# Patient Record
Sex: Female | Born: 1935 | Race: White | Hispanic: No | Marital: Married | Smoking: Never smoker
Health system: Southern US, Community
[De-identification: ages and names within clinical notes are randomized; demographics above are authoritative.]

## PROBLEM LIST (undated history)

## (undated) DIAGNOSIS — E113299 Type 2 diabetes mellitus with mild nonproliferative diabetic retinopathy without macular edema, unspecified eye: Secondary | ICD-10-CM

## (undated) DIAGNOSIS — E039 Hypothyroidism, unspecified: Secondary | ICD-10-CM

## (undated) DIAGNOSIS — N183 Chronic kidney disease, stage 3 unspecified: Secondary | ICD-10-CM

## (undated) DIAGNOSIS — G629 Polyneuropathy, unspecified: Secondary | ICD-10-CM

## (undated) DIAGNOSIS — M519 Unspecified thoracic, thoracolumbar and lumbosacral intervertebral disc disorder: Secondary | ICD-10-CM

## (undated) DIAGNOSIS — I4891 Unspecified atrial fibrillation: Secondary | ICD-10-CM

## (undated) DIAGNOSIS — E119 Type 2 diabetes mellitus without complications: Secondary | ICD-10-CM

## (undated) DIAGNOSIS — E785 Hyperlipidemia, unspecified: Secondary | ICD-10-CM

## (undated) DIAGNOSIS — K589 Irritable bowel syndrome without diarrhea: Secondary | ICD-10-CM

## (undated) DIAGNOSIS — M51379 Other intervertebral disc degeneration, lumbosacral region without mention of lumbar back pain or lower extremity pain: Secondary | ICD-10-CM

## (undated) DIAGNOSIS — I10 Essential (primary) hypertension: Secondary | ICD-10-CM

## (undated) DIAGNOSIS — I509 Heart failure, unspecified: Secondary | ICD-10-CM

## (undated) DIAGNOSIS — E669 Obesity, unspecified: Secondary | ICD-10-CM

## (undated) DIAGNOSIS — G894 Chronic pain syndrome: Secondary | ICD-10-CM

## (undated) DIAGNOSIS — K219 Gastro-esophageal reflux disease without esophagitis: Secondary | ICD-10-CM

## (undated) DIAGNOSIS — R001 Bradycardia, unspecified: Secondary | ICD-10-CM

## (undated) DIAGNOSIS — F419 Anxiety disorder, unspecified: Secondary | ICD-10-CM

## (undated) DIAGNOSIS — M5137 Other intervertebral disc degeneration, lumbosacral region: Secondary | ICD-10-CM

## (undated) DIAGNOSIS — M419 Scoliosis, unspecified: Secondary | ICD-10-CM

## (undated) DIAGNOSIS — I5032 Chronic diastolic (congestive) heart failure: Secondary | ICD-10-CM

## (undated) DIAGNOSIS — I2510 Atherosclerotic heart disease of native coronary artery without angina pectoris: Secondary | ICD-10-CM

## (undated) HISTORY — PX: ROTATOR CUFF REPAIR: SHX139

## (undated) HISTORY — PX: BACK SURGERY: SHX140

## (undated) HISTORY — PX: PILONIDAL CYST EXCISION: SHX744

## (undated) HISTORY — DX: Type 2 diabetes mellitus without complications: E11.9

## (undated) HISTORY — PX: INDUCED ABORTION: SHX677

## (undated) HISTORY — PX: CHOLECYSTECTOMY: SHX55

## (undated) HISTORY — DX: Type 2 diabetes mellitus with mild nonproliferative diabetic retinopathy without macular edema, unspecified eye: E11.3299

## (undated) HISTORY — DX: Other intervertebral disc degeneration, lumbosacral region without mention of lumbar back pain or lower extremity pain: M51.379

## (undated) HISTORY — DX: Polyneuropathy, unspecified: G62.9

## (undated) HISTORY — PX: TONSILLECTOMY: SUR1361

## (undated) HISTORY — PX: CARDIAC CATHETERIZATION: SHX172

## (undated) HISTORY — PX: DILATION AND CURETTAGE OF UTERUS: SHX78

## (undated) HISTORY — DX: Bradycardia, unspecified: R00.1

## (undated) HISTORY — DX: Anxiety disorder, unspecified: F41.9

## (undated) HISTORY — PX: LAMINOTOMY: SHX998

## (undated) HISTORY — PX: TOTAL ABDOMINAL HYSTERECTOMY W/ BILATERAL SALPINGOOPHORECTOMY: SHX83

## (undated) HISTORY — DX: Other intervertebral disc degeneration, lumbosacral region: M51.37

---

## 1998-10-23 ENCOUNTER — Other Ambulatory Visit: Admission: RE | Admit: 1998-10-23 | Discharge: 1998-10-23 | Payer: Self-pay | Admitting: Obstetrics and Gynecology

## 1999-06-12 ENCOUNTER — Encounter: Payer: Self-pay | Admitting: Internal Medicine

## 1999-06-12 ENCOUNTER — Observation Stay (HOSPITAL_COMMUNITY): Admission: EM | Admit: 1999-06-12 | Discharge: 1999-06-12 | Payer: Self-pay | Admitting: Emergency Medicine

## 1999-07-04 HISTORY — PX: LUMBAR DISC SURGERY: SHX700

## 1999-07-15 ENCOUNTER — Encounter: Payer: Self-pay | Admitting: Neurosurgery

## 1999-07-15 ENCOUNTER — Inpatient Hospital Stay (HOSPITAL_COMMUNITY): Admission: RE | Admit: 1999-07-15 | Discharge: 1999-07-16 | Payer: Self-pay | Admitting: Neurosurgery

## 1999-09-19 ENCOUNTER — Encounter: Payer: Self-pay | Admitting: Neurosurgery

## 1999-09-19 ENCOUNTER — Ambulatory Visit (HOSPITAL_COMMUNITY): Admission: RE | Admit: 1999-09-19 | Discharge: 1999-09-19 | Payer: Self-pay | Admitting: Neurosurgery

## 1999-10-08 ENCOUNTER — Encounter: Admission: RE | Admit: 1999-10-08 | Discharge: 2000-01-06 | Payer: Self-pay | Admitting: Anesthesiology

## 2000-01-14 ENCOUNTER — Other Ambulatory Visit: Admission: RE | Admit: 2000-01-14 | Discharge: 2000-01-14 | Payer: Self-pay | Admitting: Obstetrics and Gynecology

## 2000-03-31 ENCOUNTER — Encounter: Payer: Self-pay | Admitting: Gastroenterology

## 2000-03-31 ENCOUNTER — Encounter: Admission: RE | Admit: 2000-03-31 | Discharge: 2000-03-31 | Payer: Self-pay | Admitting: Gastroenterology

## 2000-04-29 ENCOUNTER — Encounter: Admission: RE | Admit: 2000-04-29 | Discharge: 2000-04-29 | Payer: Self-pay | Admitting: Gastroenterology

## 2000-04-29 ENCOUNTER — Encounter: Payer: Self-pay | Admitting: Gastroenterology

## 2000-06-11 ENCOUNTER — Encounter: Payer: Self-pay | Admitting: Specialist

## 2000-06-11 ENCOUNTER — Ambulatory Visit (HOSPITAL_COMMUNITY): Admission: RE | Admit: 2000-06-11 | Discharge: 2000-06-11 | Payer: Self-pay | Admitting: Specialist

## 2000-07-22 ENCOUNTER — Encounter: Payer: Self-pay | Admitting: Specialist

## 2000-07-28 ENCOUNTER — Encounter: Payer: Self-pay | Admitting: Specialist

## 2000-07-28 ENCOUNTER — Inpatient Hospital Stay (HOSPITAL_COMMUNITY): Admission: RE | Admit: 2000-07-28 | Discharge: 2000-07-31 | Payer: Self-pay | Admitting: Specialist

## 2000-08-03 HISTORY — PX: HEMILAMINOTOMY LUMBAR SPINE: SUR654

## 2001-04-07 ENCOUNTER — Other Ambulatory Visit: Admission: RE | Admit: 2001-04-07 | Discharge: 2001-04-07 | Payer: Self-pay | Admitting: Obstetrics and Gynecology

## 2001-05-19 ENCOUNTER — Ambulatory Visit: Admission: RE | Admit: 2001-05-19 | Discharge: 2001-05-19 | Payer: Self-pay | Admitting: Specialist

## 2001-08-09 ENCOUNTER — Inpatient Hospital Stay (HOSPITAL_COMMUNITY): Admission: EM | Admit: 2001-08-09 | Discharge: 2001-08-10 | Payer: Self-pay | Admitting: Specialist

## 2002-02-05 ENCOUNTER — Emergency Department (HOSPITAL_COMMUNITY): Admission: EM | Admit: 2002-02-05 | Discharge: 2002-02-05 | Payer: Self-pay | Admitting: Emergency Medicine

## 2002-02-05 ENCOUNTER — Encounter: Payer: Self-pay | Admitting: Emergency Medicine

## 2002-07-26 ENCOUNTER — Other Ambulatory Visit: Admission: RE | Admit: 2002-07-26 | Discharge: 2002-07-26 | Payer: Self-pay | Admitting: Obstetrics and Gynecology

## 2003-08-20 ENCOUNTER — Inpatient Hospital Stay (HOSPITAL_COMMUNITY): Admission: EM | Admit: 2003-08-20 | Discharge: 2003-08-22 | Payer: Self-pay | Admitting: Emergency Medicine

## 2003-09-03 ENCOUNTER — Encounter: Admission: RE | Admit: 2003-09-03 | Discharge: 2003-09-03 | Payer: Self-pay | Admitting: Cardiology

## 2006-04-12 ENCOUNTER — Other Ambulatory Visit: Admission: RE | Admit: 2006-04-12 | Discharge: 2006-04-12 | Payer: Self-pay | Admitting: Obstetrics and Gynecology

## 2008-01-31 ENCOUNTER — Emergency Department (HOSPITAL_COMMUNITY): Admission: EM | Admit: 2008-01-31 | Discharge: 2008-01-31 | Payer: Self-pay | Admitting: Emergency Medicine

## 2008-02-18 ENCOUNTER — Inpatient Hospital Stay (HOSPITAL_COMMUNITY): Admission: EM | Admit: 2008-02-18 | Discharge: 2008-02-21 | Payer: Self-pay | Admitting: Emergency Medicine

## 2008-02-18 ENCOUNTER — Ambulatory Visit: Payer: Self-pay | Admitting: Internal Medicine

## 2008-07-19 ENCOUNTER — Other Ambulatory Visit: Admission: RE | Admit: 2008-07-19 | Discharge: 2008-07-19 | Payer: Self-pay | Admitting: Obstetrics and Gynecology

## 2010-02-12 ENCOUNTER — Other Ambulatory Visit: Admission: RE | Admit: 2010-02-12 | Discharge: 2010-02-12 | Payer: Self-pay | Admitting: Obstetrics and Gynecology

## 2010-10-26 ENCOUNTER — Inpatient Hospital Stay (HOSPITAL_COMMUNITY)
Admission: EM | Admit: 2010-10-26 | Discharge: 2010-10-27 | DRG: 303 | Disposition: A | Discharge status: Home / Self Care | Payer: Medicare Other | Attending: Internal Medicine | Admitting: Internal Medicine

## 2010-10-26 ENCOUNTER — Emergency Department (HOSPITAL_COMMUNITY): Payer: Medicare Other

## 2010-10-26 DIAGNOSIS — E039 Hypothyroidism, unspecified: Secondary | ICD-10-CM | POA: Diagnosis present

## 2010-10-26 DIAGNOSIS — I200 Unstable angina: Secondary | ICD-10-CM | POA: Diagnosis present

## 2010-10-26 DIAGNOSIS — Z9861 Coronary angioplasty status: Secondary | ICD-10-CM

## 2010-10-26 DIAGNOSIS — Z66 Do not resuscitate: Secondary | ICD-10-CM | POA: Diagnosis present

## 2010-10-26 DIAGNOSIS — M4120 Other idiopathic scoliosis, site unspecified: Secondary | ICD-10-CM | POA: Diagnosis present

## 2010-10-26 DIAGNOSIS — I2510 Atherosclerotic heart disease of native coronary artery without angina pectoris: Principal | ICD-10-CM | POA: Diagnosis present

## 2010-10-26 DIAGNOSIS — E669 Obesity, unspecified: Secondary | ICD-10-CM | POA: Diagnosis present

## 2010-10-26 DIAGNOSIS — F319 Bipolar disorder, unspecified: Secondary | ICD-10-CM | POA: Diagnosis present

## 2010-10-26 DIAGNOSIS — E119 Type 2 diabetes mellitus without complications: Secondary | ICD-10-CM | POA: Diagnosis present

## 2010-10-26 DIAGNOSIS — E785 Hyperlipidemia, unspecified: Secondary | ICD-10-CM | POA: Diagnosis present

## 2010-10-26 DIAGNOSIS — I10 Essential (primary) hypertension: Secondary | ICD-10-CM | POA: Diagnosis present

## 2010-10-26 LAB — COMPREHENSIVE METABOLIC PANEL
ALT: 19 U/L (ref 0–35)
AST: 23 U/L (ref 0–37)
Alkaline Phosphatase: 80 U/L (ref 39–117)
CO2: 24 mEq/L (ref 19–32)
Chloride: 105 mEq/L (ref 96–112)
GFR calc Af Amer: >60 mL/min (ref >60)
GFR calc non Af Amer: 56 mL/min — ABNORMAL LOW (ref >60)
Potassium: 4.5 mEq/L (ref 3.5–5.1)
Sodium: 134 mEq/L — ABNORMAL LOW (ref 135–145)
Total Bilirubin: 0.3 mg/dL (ref 0.3–1.2)

## 2010-10-26 LAB — CARDIAC PANEL(CRET KIN+CKTOT+MB+TROPI)
Relative Index: 1.9 (ref 0.0–2.5)
Total CK: 237 U/L — ABNORMAL HIGH (ref 7–177)
Troponin I: 0.03 ng/mL (ref 0.00–0.06)
Troponin I: 0.04 ng/mL (ref 0.00–0.06)

## 2010-10-26 LAB — CBC
HCT: 39.1 % (ref 36.0–46.0)
MCHC: 32.4 g/dL (ref 30.0–36.0)
MCV: 94.2 fL (ref 78.0–100.0)
RDW: 14.3 % (ref 11.5–15.5)
RDW: 14.4 % (ref 11.5–15.5)
WBC: 9.5 10*3/uL (ref 4.0–10.5)
WBC: 8.3 10*3/uL (ref 4.0–10.5)

## 2010-10-26 LAB — HEMOGLOBIN A1C: Hgb A1c MFr Bld: 6.8 % — ABNORMAL HIGH (ref <5.7)

## 2010-10-26 LAB — BASIC METABOLIC PANEL
CO2: 24 mEq/L (ref 19–32)
Calcium: 10.3 mg/dL (ref 8.4–10.5)
Creatinine, Ser: 1.06 mg/dL (ref 0.4–1.2)
GFR calc Af Amer: >60 mL/min (ref >60)
GFR calc non Af Amer: 50 mL/min — ABNORMAL LOW (ref >60)

## 2010-10-26 LAB — APTT: aPTT: 27 seconds (ref 24–37)

## 2010-10-26 LAB — DIFFERENTIAL
Basophils Absolute: 0 10*3/uL (ref 0.0–0.1)
Basophils Relative: 0 % (ref 0–1)
Eosinophils Relative: 3 % (ref 0–5)
Eosinophils Relative: 4 % (ref 0–5)
Lymphocytes Relative: 31 % (ref 12–46)
Lymphs Abs: 2.6 10*3/uL (ref 0.7–4.0)
Monocytes Absolute: 0.7 10*3/uL (ref 0.1–1.0)
Neutro Abs: 6.9 10*3/uL (ref 1.7–7.7)

## 2010-10-26 LAB — GLUCOSE, CAPILLARY: Glucose-Capillary: 163 mg/dL — ABNORMAL HIGH (ref 70–99)

## 2010-10-26 LAB — POTASSIUM: Potassium: 5.2 mEq/L — ABNORMAL HIGH (ref 3.5–5.1)

## 2010-10-26 LAB — POCT I-STAT, CHEM 8
HCT: 39 % (ref 36.0–46.0)
Hemoglobin: 13.3 g/dL (ref 12.0–15.0)
Potassium: 5.2 mEq/L — ABNORMAL HIGH (ref 3.5–5.1)
Sodium: 139 mEq/L (ref 135–145)

## 2010-10-26 LAB — PROTIME-INR
INR: 1 (ref 0.00–1.49)
Prothrombin Time: 13.4 seconds (ref 11.6–15.2)

## 2010-10-26 LAB — CK TOTAL AND CKMB (NOT AT ARMC): Total CK: 290 U/L — ABNORMAL HIGH (ref 7–177)

## 2010-10-26 LAB — LIPID PANEL
Cholesterol: 137 mg/dL (ref 0–200)
HDL: 38 mg/dL — ABNORMAL LOW (ref >39)
Triglycerides: 170 mg/dL — ABNORMAL HIGH (ref <150)

## 2010-10-27 LAB — GLUCOSE, CAPILLARY
Glucose-Capillary: 107 mg/dL — ABNORMAL HIGH (ref 70–99)
Glucose-Capillary: 103 mg/dL — ABNORMAL HIGH (ref 70–99)
Glucose-Capillary: 107 mg/dL — ABNORMAL HIGH (ref 70–99)
Glucose-Capillary: 183 mg/dL — ABNORMAL HIGH (ref 70–99)

## 2010-10-27 LAB — CBC
HCT: 39.7 % (ref 36.0–46.0)
MCH: 30.5 pg (ref 26.0–34.0)
MCV: 94.7 fL (ref 78.0–100.0)
Platelets: 194 10*3/uL (ref 150–400)
RBC: 4.19 MIL/uL (ref 3.87–5.11)
RDW: 14.4 % (ref 11.5–15.5)

## 2010-10-27 LAB — BASIC METABOLIC PANEL
BUN: 12 mg/dL (ref 6–23)
CO2: 25 mEq/L (ref 19–32)
GFR calc non Af Amer: 58 mL/min — ABNORMAL LOW (ref >60)
Glucose, Bld: 132 mg/dL — ABNORMAL HIGH (ref 70–99)
Potassium: 5.1 mEq/L (ref 3.5–5.1)

## 2010-10-27 NOTE — Consult Note (Signed)
NAMEALIZON, Kristine Oliver NO.:  [id number]  MEDICAL RECORD NO.:  [id number]           PATIENT TYPE:  I  LOCATION:  2035                         FACILITY:  [facility]  PHYSICIAN:  Jerline Pain, MD      DATE OF BIRTH:  1931-04-17  DATE OF CONSULTATION:  10/26/2010 DATE OF DISCHARGE:                                CONSULTATION   PRIMARY CARE PHYSICIAN:  Delanna Ahmadi, MD  CARDIOLOGIST:  Jerline Pain, MD (Dr. Leonia Reeves' patient).  REASON FOR EVALUATION:  Chest pain.  HISTORY OF PRESENT ILLNESS:  A 77 year old female with history of coronary artery disease status post drug-eluting stent, 2.7 x 28-mm Taxus to the mid LAD on August 21, 2003 with minor 30% RCA lesion, who presented with chest pressure radiation to the left arm with diaphoresis and nausea concerning for unstable angina.  She had had a similar episode 2 weeks prior.  Her CK was 290, MB is slightly elevated at 5.0, and her troponin thus far is normal at 0.03.  Currently, she is chest painfree.  The overall duration of the episode lasted about an hour.  When she was in the emergency room, she received nitroglycerin and was pretty much chest pain free, "elephant sitting on chest."  PAST MEDICAL HISTORY: 1. Coronary artery disease, DES to mid LAD as described above. 2. Diabetes. 3. Hypothyroidism. 4. Hypertension. 5. Obesity. 6. Prior anxiety disorder. 7. Bipolar disorder. 8. Scoliosis.  PAST SURGICAL HISTORY: 1. Lumbar laminectomy. 2. Hysterectomy. 3. Cholecystectomy. 4. D and C. 5. Removal of pilonidal cyst.  ALLERGIES:  No known drug allergies.  MEDICATIONS: 1. Estrogen. 2. Acyclovir 5 mg a day. 3. Alprazolam 0.25 mg 2 times a day as needed. 4. Neurontin 300 mg twice a day. 5. Prevacid 15 mg a day. 6. Simvastatin 20 mg a day. 7. Plavix 75 mg a day. 8. Altace 5 mg a day. 9. Metformin 1000 mg twice a day. 10.Levothroid 50 mcg a day. 11.Nitroglycerin p.r.n.  FAMILY HISTORY:  Father  died of stroke at 33.  Mother died of old age at 63, had bypass at age 77.  SOCIAL HISTORY:  She is divorced, has one son.  Quit smoking in 1986. Drinks occasional wine.  REVIEW OF SYSTEMS:  No bleeding.  No syncope.  No orthopnea.  No shortness of breath.  Currently chest painfree.  Unless specified above, all other 12 review of systems are negative.  PHYSICAL EXAMINATION:  VITAL SIGNS:  Blood pressure 90/60, pulse 58, temperature 98.1, respirations 18, satting 97% on room air.  Prior blood pressure is 126/40 in the ER.  Satting 100% on room air. GENERAL:  She is alert and oriented x3, mildly anxious, lying flat in bed with her son at bedside. EYES:  Well-perfused conjunctivae.  EOMI.  No scleral icterus. NECK:  Supple.  No lymphadenopathy.  No thyromegaly.  No carotid bruits. No JVD.  CARDIOVASCULAR:  Regular rate and rhythm without any murmurs, rubs, or gallops.  Normal PMI. LUNGS:  Clear to auscultation bilaterally.  Normal respiratory effort. HEENT:  Edentulous two front teeth.  Moist mucous membranes. ABDOMEN:  Soft,  nontender.  Normoactive bowel sounds. Obese.  Large pannus.  No bruits.  EXTREMITIES:  No clubbing, cyanosis, or edema.  2+ radial pulse, right arm. GU:  Deferred. RECTAL:  Deferred. SKIN:  Warm, dry, and intact.  No rashes. PSYCHIATRIC:  Normal.  EKG shows sinus bradycardia, rate 58 with first-degree AV block.  No ST- segment changes.  No change when compared to prior EKG.  Cardiac markers as described above.  White count 8.3, hemoglobin 12.8, platelets 212. LDL cholesterol 65.  Chest x-ray personally viewed shows no acute disease.  ASSESSMENT AND PLAN:  A 77 year old female with coronary artery disease, status post drug-eluting stent to mid left anterior descending coronary artery in 2005 with diabetes, hypertension, gastroesophageal reflux disease, hyperlipidemia, who is DNR/DNI here with unstable angina, possible acute coronary syndrome. 1. Unstable  angina, possible acute coronary syndrome - I will go ahead     and change her Lovenox deep venous thrombosis dose to acute     coronary syndrome dose.  Hold beta-blocker given first-degree     atrioventricular block and sinus bradycardia.  Hold metformin as     previously ordered for cardiac catheterization.  Risks and benefits     of the procedure including stroke, heart attack, death have been     explained to the patient.  I do believe that she     would be a right radial artery candidate, 2+ radial artery site.     Adequate sedation requested.  Troponin is currently negative.     However, the MB fraction is slightly elevated.  Continue to cycle     cardiac markers. 2. In regard to hyperlipidemia, she is currently well controlled on     some statin.  Continue to monitor diabetes.     Jerline Pain, MD     MCS/MEDQ  D:  10/26/2010  T:  10/26/2010  Job:  [id number]  cc:   Delanna Ahmadi, M.D.  Electronically Signed by [name] MD on 10/27/2010 02:38:57 PM

## 2010-10-28 NOTE — Procedures (Signed)
Kristine Oliver, ROSENMAN NO.:  [id number]  MEDICAL RECORD NO.:  [id number]           PATIENT TYPE:  LOCATION:                                 FACILITY:  PHYSICIAN:  Jerline Pain, MD      DATE OF BIRTH:  Dec 12, 1930  DATE OF PROCEDURE:  10/27/2010 DATE OF DISCHARGE:                           CARDIAC CATHETERIZATION   INDICATIONS:  A 77 year old female with possible acute coronary syndrome who had previously placed drug-eluting stents to her LAD in 2006 with chest discomfort.  PROCEDURE DETAILS:  Informed consent was obtained.  Risk of stroke, heart attack, death, renal impairment, arterial damage, bleeding were explained to the patient at length.  She was prepped and draped in a sterile fashion, 1% lidocaine was used for local anesthesia. Visualization of femoral head was obtained via fluoroscopy.  The 5- French sheath was inserted in to the right femoral artery without difficulty.  Judkins left #4 catheter and Judkins right #4 catheter was used selectively to cannulate the coronary arteries.  Multiple views with hand injection of Omnipaque were obtained.  Angled pigtail was used to cross in the left ventricle.  Hemodynamics obtained.  Left ventriculogram in the RAO position using power injection was performed with 25 mL of contrast.  Pullback was obtained.  The angled pigtail was then brought to the level of the renal arteries and an abdominal aortogram was performed.  This was done because of her hypertension.  FINDINGS: 1. Right coronary artery - minor luminal irregularities gives rise to     the posterior descending artery as well as 3 posterior lateral     branches.  No significant coronary artery disease is illustrated.     Difficult to selectively cannulate the ostium.  Adequate     angiography, however, was performed. 2. Left main artery, widely patent, splits into the LAD as well as     circumflex. 3. LAD, minor ostial stenosis of 20%.  The  previously placed LAD     stents are widely patent.  There is a moderate size septal branch     in the middle of the previously placed LAD stent that is jailed and     has 90% stenosis.  There are 2 small diagonal branches quite small     in caliber.  Overall, the LAD is a fairly small caliber vessel. 4. Circumflex artery.  There is 30% narrowing or stenosis just after     the first major obtuse marginal branch in the AV groove circ.     Otherwise, tortuous vessels with no angiographically significant     disease present.  Left ventriculogram demonstrates normal left     ventricular ejection fraction with no wall motion abnormalities     present.  No significant mitral regurgitation detected.  Ascending     aorta appears normal.  Abdominal aortogram demonstrates     approximately 30-40% left renal artery stenosis with minor luminal     irregularities of the right renal artery.  No evidence of abdominal     aortic aneurysm present.  There is distal aortic calcification  noted.  HEMODYNAMIC:  Left ventricular systolic pressure [id number] with an end- diastolic pressure of 27 mmHg.  Aortic pressure 170/65 with a mean of 103 mmHg.  IMPRESSION: 1. Widely patent previously placed drug-eluting stent to the LAD. 2. Jailed and narrowed septal branch of 90%. 3. A 30% atrioventricular groove circumflex stenosis just after the     first obtuse marginal branch.  No flow-limiting disease present. 4. Left ventriculogram demonstrates normal ejection fraction with no     wall motion abnormality.  Ejection fraction of 55%.  Abdominal     aortogram demonstrates left renal artery 30-40% stenosis; however,     does not appear to be flow limiting - hypertension noted during     catheterization with pressures initially 218/91, down to 160/65     aortic pressure after 20 mg of hydralazine and 2 sublingual     nitroglycerins administered. 5. Diastolic dysfunction and hypertension - need improved control.      Certainly, this could be contributing to her angina-like symptoms.  Once bedrest is resolved, I feel comfortable with her being discharged from a cardiovascular standpoint.  I have relayed message to Dr. Lavone Orn, her primary physician.     Jerline Pain, MD     MCS/MEDQ  D:  10/27/2010  T:  10/28/2010  Job:  [id number]  cc:   Kristine Oliver, M.D.  Electronically Signed by [name] MD on 10/28/2010 01:55:37 PM

## 2010-10-30 NOTE — H&P (Signed)
Kristine Oliver               ACCOUNT NO.:  [id number]  MEDICAL RECORD NO.:  [id number]           PATIENT TYPE:  E  LOCATION:  [facility]                         FACILITY:  [facility]  PHYSICIAN:  Farris Has, MDDATE OF BIRTH:  [birth year]  DATE OF ADMISSION:  10/26/2010 DATE OF DISCHARGE:                             HISTORY & PHYSICAL   PRIMARY CARE PROVIDER:  Delanna Ahmadi, MD  CHIEF COMPLAINT:  Chest pain.  HISTORY OF PRESENT ILLNESS:  The patient is a 77 year old female with a history of coronary artery disease status post MI followed by Dr. Leonia Reeves with a history of diabetes and hypertension.  She was feeling well up until tonight and after taking a hot shower and rubbing her legs in a downward motion, she developed chest pressure which was like an elephant sitting on her chest.  It radiated to her left arm, caused her to have some nausea, and feeling weak all over.  She took some nitroglycerin with some relief and then she presented to the emergency department where she received more nitroglycerin and currently she is chest pain-free.  Overall, this episode lasted about an hour.  This was nonexertional.  REVIEW OF SYSTEMS:  No associated diaphoresis, no fevers, no chills, no diarrhea, otherwise review of systems are negative.  PAST MEDICAL HISTORY: 1. Diabetes. 2. Coronary artery disease status post myocardial infarction,     currently continues to be on Plavix. 3. Hypothyroidism. 4. Hypertension. 5. She is also status post stent placement.  SOCIAL HISTORY:  She is a former smoker, currently does not smoke, does not drink, or abuse drugs.  FAMILY HISTORY:  Significant for coronary artery disease in her mother, grandmother, and father.  ALLERGIES:  PREDNISONE causes nausea and vomiting.  MEDICATIONS: 1. Estrogen and methyltestosterone 0.625/1.25 one tablet daily. 2. Valacyclovir 500 mg daily. 3. Clindamycin 1% to face 1 application twice daily. 4. GenTeal  lubricant eye gel 1-2 drops twice daily as needed. 5. Nitroglycerin as needed for pain. 6. Premarin 1.25 mg daily. 7. Alprazolam 0.25 one tablet 3 times a day as needed. 8. Neurontin 300 mg twice daily. 9. Ibuprofen as needed for pain. 10.Full-dose aspirin. 11.Prevacid 15 mg daily. 12.Simvastatin 20 mg daily. 13.Plavix 75 mg daily. 14.Zolpidem 10 mg at bedtime. 15.Altace 5 mg daily. 16.Metformin 1000 mg twice daily. 17.Levothroid 50 mcg daily.  PHYSICAL EXAMINATION:  VITAL SIGNS:  Temperature 98.2, blood pressure 126/48, pulse 65, respirations 17, and saturating 100% on room air. GENERAL:  The patient appears to be in no acute distress. HEAD:  Nontraumatic. MOUTH:  Moist mucous membranes. LUNGS:  Clear to auscultation bilaterally. HEART:  Regular rate and rhythm with no murmurs appreciated. ABDOMEN:  Obese but nontender. LOWER EXTREMITIES:  Without clubbing, cyanosis, or edema. NEUROLOGIC:  Grossly intact.  LABORATORY DATA:  White blood cell count 9.5 and hemoglobin 13.3. Sodium 139, potassium 5.2 on initial rubs labs that are being currently repeated.  Bicarb 24, creatinine 1.1, and calcium 10.3.  Chest x-ray showing no cardiopulmonary disease.  EKG showing sinus rhythm, first-degree AV block, rate of 61.  It is unchanged from an EKG from January 2005.  No ischemic changes.  ASSESSMENT AND PLAN: 1. This is a 77 year old female with history of coronary artery     disease who presents with chest pain.  We will admit.  She is     currently chest pain-free.  We will cycle cardiac markers and     obtain serial EKGs.  We will obtain fasting lipid panel, hemoglobin     A1c, and check TSH.  I would strongly recommend Cardiology consult     in the a.m., for now we will keep n.p.o. because she needs a stress     test.  We will continue full-dose aspirin and Plavix and watch out     for signs of bleeding. 2. Diabetes mellitus.  We will put on sliding scale and hold metformin     in  case she needs contrast. 3. Hypertension.  Given slightly elevated potassium, we will hold ACE     inhibitor for now and follow potassium.  We will repeat labs. 4. Prophylaxis.  Protonix and Lovenox. 5. Code status.  The patient very strongly wished to be do not     resuscitate, do not intubate.  Her family members     actually were trying to convince me to be full code but the patient     is alert and oriented and in full capacity to make own decisions.     I strongly felt that she really wishes to be do not resuscitate, do     not intubate, and her wishes were respected.     Farris Has, MD     AVD/MEDQ  D:  10/26/2010  T:  10/26/2010  Job:  [id number]  cc:   Kristine Oliver, M.D. Kristine Oliver, M.D.  Electronically Signed by Toy Baker MD on 10/30/2010 02:39:58 AM

## 2010-12-16 NOTE — Consult Note (Signed)
NAMEKERYN, TEUTSCH NO.:  [id number]   MEDICAL RECORD NO.:  [id number]          PATIENT TYPE:  INP   LOCATION:  [id number]                         FACILITY:  [facility]   PHYSICIAN:  Ezzard Standing, M.D.DATE OF BIRTH:  13-Jun-1931   DATE OF CONSULTATION:  02/18/2008  DATE OF DISCHARGE:                                 CONSULTATION   REASON FOR CONSULTATION:  Chest pain.   HISTORY:  The patient is a 77 year old female with a history of diet-  controlled diabetes, hypertension and bipolar disorder.  She has a  previous history of coronary artery disease and had a non-Q-wave  infarction in January of 2005.  She had a 2.7 x 28 mm TAXUS stent placed  to the mid LAD that was post-dilated to 3 mm.  She has done fairly well  since that time.  At the time, she had minimal disease in the circumflex  and a 30% tubular stenosis involving the right coronary artery.  She was  admitted to the hospital with substernal chest discomfort while she was  at a party, described as bilateral substernal chest pain radiating to  her left arm, similar to previous pain; it was relieved with  nitroglycerin and lasted about 1 hour.  Initial enzymes were  unremarkable.   PAST MEDICAL HISTORY:  Remarkable for:  1. Bipolar disorder.  2. Anxiety disorder.  3. Hypertension.  4. Diabetes mellitus which is controlled.  5. Obesity.  6. Scoliosis.   PREVIOUS SURGERY:  1. Previous lumbar laminectomy twice.  2. Hysterectomy.  3. Cholecystectomy.  4. D&C.  5. Removal of pilonidal cyst.   MEDICATIONS AT HOME:  Gabapentin, estrogen, methyltestosterone, aspirin,  Premarin, Lamictal, topiramate, alprazolam and Valtrex.   ALLERGIES:  None.   SOCIAL HISTORY:  She is divorced and currently lives alone, has 1 son,  quit smoking in 1986, drinks occasional wine.   FAMILY HISTORY:  Father died of a stroke at age 89.  Mother died of old  age at 92 and had bypass at age 38.  One sister alive and well.   REVIEW OF SYSTEMS:  She has been obese for several years.  She has no  eye, ear, nose or throat complaints.  She has severe chronic back pain  with chronic sciatic.  She has chronic constipation.  She has a history  of prior herpes simplex infection previously.  She has no significant  bowel complaints.  She has a history of bipolar disorder, significant  anxiety disorder.  She has chronic neuropathic type pain also.   EXAMINATION:  She is an obese female who is currently in no acute  distress and is currently pain-free.  Her weight is 97.523 kg, blood  pressure was 140/80.  SKIN:  Warm and dry.  Occasional seborrheic keratoses noted.  ENT:  Varney Daily, CNS clear.  Fundi not examined.  Pharynx negative.  NECK:  Supple, without masses, JVD, thyromegaly or bruits.  LUNGS:  Clear to A&P.  CARDIOVASCULAR EXAM:  Normal S1 and S2, no S3, no murmur.  ABDOMEN:  Soft and nontender.  Her femoral pulses are deep  and are 1 to  2+, peripheral pulses are diminished, posterior tibial is palpated on  the right and on the left.   EKG shows minor nonspecific ST and T-wave changes noted.   LABORATORY DATA ON ADMISSION:  Shows a normal CBC, PT and PTT.  Glucose  is 151, BUN 23, creatinine is 1.20.  Liver enzymes were normal. calcium  is 10.2, troponins were negative and initial CPK is normal, alcohol  level is less than 5, B-natriuretic peptide level is 197.   IMPRESSION:  1. Prolonged chest discomfort relieved with nitroglycerin, suggestive      of unstable angina.  2. Coronary artery disease with previous drug-eluting stent placement      in the mid left anterior descending in 2005.  3. Diet-controlled diabetes.  4. History of anxiety disorder and bipolar disorder.  5. Obesity.  6. Hyperlipidemia.  7. Chronic low back pain with sciatica.   RECOMMENDATIONS:  Would continue with Lovenox, begin heparin, continue  aspirin and Plavix.  At this point, I think we will need to proceed with  cardiac  catheterization to exclude significant coronary artery disease.  This will be arranged to set up on Monday for Dr. Leonia Reeves to do.      Ezzard Standing, M.D.  Electronically Signed     WST/MEDQ  D:  02/18/2008  T:  02/18/2008  Job:  [id number]   cc:   Barnett Abu, M.D.  Delanna Ahmadi, M.D.

## 2010-12-16 NOTE — H&P (Signed)
NAMEZEIDA, RATKOVICH NO.:  [id number]   MEDICAL RECORD NO.:  [id number]          PATIENT TYPE:  INP   LOCATION:  [id number]                         FACILITY:  [facility]   PHYSICIAN:  Crissie Reese, MD    DATE OF BIRTH:  [birth year]   DATE OF ADMISSION:  02/17/2008  DATE OF DISCHARGE:                              HISTORY & PHYSICAL   PRIMARY CARE Evian Salguero:  Delanna Ahmadi, M.D.   CHIEF COMPLAINT:  Chest pain.   HISTORY OF PRESENT ILLNESS:  A 77 year old white female with a past  medical history significant for coronary artery disease, status post  drug-eluting stent to the left anterior descending artery in 2005, who  presents for evaluation of chest pain.  This evening the patient was out  at a party where she was standing around with friends when she developed  sudden onset of chest pressure that was described as a 10/10 on a pain  scale.  It was in her bilateral chest and radiated to her left arm.  This pain was similar to her prior myocardial infarction.  It was  relieved with nitroglycerin and the episode lasted approximately 1 hour.  The episode was associated with shortness of breath and clamminess.  Of  note, the patient denies any recent exertional angina or exertional  shortness of breath.   She denies fevers, chills, cough, abdominal pain, nausea, vomiting or  diarrhea.  Of note, she does have bipolar disorder and states that she  has had trouble sleeping for the past few days.   REVIEW OF SYSTEMS:  All systems were reviewed and are negative except as  mentioned above in the history of present illness.   PAST MEDICAL HISTORY:  1. Non-ST segment elevation myocardial infarction in 2005, status post      drug-eluting stent to the left anterior descending artery.  2. Diabetes mellitus type 2, diet-controlled.  3. Hypertension.  4. Bipolar disorder.  5. Chronic pain syndrome.   SOCIAL HISTORY:  Patient denies tobacco, alcohol or drugs.   FAMILY HISTORY:   Positive for coronary disease.   ALLERGIES:  No known drug allergies.   MEDICATIONS:  The patient does not know the exact medications that she  is taking but reports that she takes approximately 10 medicines, some of  which include the following:   1. Aspirin.  2. Plavix.  3. Ambien.  4. Altace.  5. OxyContin.  6. Simvastatin.  7. Oxycodone.  8. Topamax.  9. Lamictal.   In the morning it will be important to get an accurate medication list.   PHYSICAL EXAMINATION:  VITAL SIGNS:  Temperature afebrile, blood  pressure 120/70, pulse 49, respirations 18, oxygen saturation 98% on  room air.  GENERAL:  In no acute distress.  HEENT:  Normocephalic, atraumatic.  Pupils equal, round and reactive to  light and accommodation.  Her extraocular movements are intact.  NECK:  Supple.  No lymphadenopathy, no jugular venous distention, no  masses.  CARDIOVASCULAR:  Rate and rhythm.  No murmur, rub, or gallop.  CHEST:  Clear to auscultation bilaterally.  ABDOMEN: Positive bowel sounds,  soft, nontender, nondistended.  EXTREMITIES:  No clubbing, cyanosis, or edema.   LABORATORY STUDIES:  CBC is all within normal limits.  CMP is notable  only for an elevated blood sugar of 151.  Coagulation studies are  normal.  Cardiac enzymes revealed troponin of less than 0.05.  Alcohol  level:  Undetectable.  Lipase level:  Normal.  Brain natriuretic peptide  of 197.  Urinalysis:  Unremarkable.   STUDIES.:  1. Chest x-ray;  no acute cardiopulmonary disease.  2. EKG from July 18 at 12:32 a.m. reveals sinus bradycardia with first-      degree AV block with no acute ST or T-wave changes.  3. Repeat EKG #2 from July 18 at 5:25 a.m. again shows sinus      bradycardia with a first-degree AV block with no acute ST or T-wave      changes.  4. There is one EKG strip from EMS which has transient ST-segment      elevation in lead III; however, on this strip it only includes      leads I, II and III, and there is  no ST elevation on those other      strips.   ASSESSMENT:  A 77 year old white female with past medical history  significant for coronary artery disease status post drug-eluting stent  to left anterior descending artery in 2005, who presents with an episode  of substernal chest pressure which is similar to her prior myocardial  infarction.   1. We will admit the patient to the [facility] to Dr. Ladell Heads care.   1. Chest pain.  This is worrisome for angina based on her history.      Additionally the one EKG strip with transient ST-segment elevation      is also concerning.  All of her EKGs here are fairly normal with      the exception of sinus bradycardia, and she is currently chest pain-      free.  She has negative cardiac biomarkers, which is encouraging;      however, because of her presentation and abnormal EKG, we will give      her a shot of Lovenox 1 mg/kg subcu x1 until she rules out for      myocardial infarction.  We will continue her on aspirin 325 mg      daily and Plavix 75 mg daily.  At this time we will hold beta      blockade as the patient is currently bradycardic.  We will not use      a IIb/IIIa inhibitor unless the patient rules in for a myocardial      infarction.  Check serial cardiac enzymes.  Monitor the patient on      telemetry.  Consider ischemia evaluation if she rules out for      myocardial infarction.  Lipitor 40 mg daily.   1. Other chronic medical problems include bipolar disorder, chronic      pain syndrome and hypertension.  At this time the patient does not      know her home medication list and it will be necessary to obtain      her home medication list so that we can adequately continue her      chronic medications.   1. Fluids, electrolytes, and nutrition.  Normal saline at [facility].      Electrolytes are stable.  Regular diet.   1. Deep vein thrombosis  prophylaxis.  The patient will be on systemic       Lovenox for the interim time being.      Crissie Reese, MD  Electronically Signed     SJT/MEDQ  D:  02/18/2008  T:  02/18/2008  Job:  [id number]

## 2010-12-19 NOTE — H&P (Signed)
[facility]  Patient:    Kristine Oliver, Kristine Oliver Visit Number: [id number] MRN: [id number]          Service Type: Attending:  Johnn Hai, M.D. Dictated by:   Jannette Fogo. Jenean Lindau, P.A.-C. Adm. Date:  08/08/01                           History and Physical  CHIEF COMPLAINT:  Left shoulder pain.  HISTORY OF PRESENT ILLNESS:  Kristine Oliver is a 77 year old white female who has approximately a one-year history of left shoulder pain with increasing disability over the past year.  She had been seen and evaluated by Kristine Oliver at [facility] where she was found to have a full-thickness rotator cuff tear of the left shoulder as well as impingement syndrome and AC arthrosis.  Due to these findings, surgical intervention was discussed with Kristine Oliver, to which she was agreeable.  ALLERGIES:  PREDNISONE P.O. makes her extremely nauseous.  CURRENT MEDICATIONS:  Premarin, Altace, Neurontin, oxycodone, hydrochlorothiazide, labetalol, OxyContin 10 mg, Avandia, Xanax, and Ambien. The doses of these medicines will need to be verified at the time of her admit as she does not recall the dosage and does not have her medicine with her today.  PAST MEDICAL HISTORY: 1. Hypertension. 2. Diabetes mellitus with associated neuropathy. 3. History of foraminal stenosis. 4. Chronic pain syndrome of the low back. 5. Severe anxiety. 6. Bipolar disorder.  PAST SURGICAL HISTORY: 1. Removal of pilonidal cyst. 2. D&C x3 with abortion x1. 3. Cholecystectomy in 1992. 4. Hysterectomy in 1987. 5. Back surgery x2 in 2000, 2001. 6. Hiatal hernia repair.  SOCIAL HISTORY:  The patient is divorced.  One son.  No tobacco use.  She does have an occasional glass of wine.  Lives alone.  PRIMARY CARE Kristine Oliver:  Dr. Laurann Oliver.  PSYCHOTHERAPIST:  Carollee Oliver.  FAMILY HISTORY:  Father had diabetes mellitus, had a CVA at 49, coronary artery disease, and congestive heart failure.   Mother, 32, had coronary artery disease.  REVIEW OF SYSTEMS:  GENERAL:  The patient denies any recent weight loss, night sweats, fevers, or chills.  HEENT:  Denies any chronic headaches, ringing of ears, seeing spots, specks, running nose, sore throat.  CHEST:  Denies any chronic cough, productive cough, hemoptysis.  CARDIOVASCULAR:  Denies any chest pain, syncopal episodes, irregular heartbeat. GASTROINTESTINAL/GENITOURINARY:  Denies any history of chronic diarrhea, constipation, dysuria, frequency, urgency.  EXTREMITIES:  Please see HPI. NEUROLOGIC:  Denies any history of seizure, strokes.  PHYSICAL EXAMINATION:  VITAL SIGNS:  Blood pressure 158/78, respirations 16, pulse 60.  GENERAL:  Very talkative, anxious-appearing 77 year old white female.  HEENT:  Head atraumatic, normocephalic.  NECK:  Supple.  Without masses.  CHEST:  Clear to auscultation bilaterally.  HEART:  Regular rate and rhythm.  S1, S2.  ABDOMEN:  Soft and nontender.  Bowel sounds positive x4 quadrants.  No guarding or rebound.  GENITOURINARY:  Not pertinent to present illness.  EXTREMITIES:  Exquisitely tender over the distal clavicle as well as anterior subacromial region.  She has weakness with external rotation.  Positive impingement sign.  Decreased internal rotation.  She does have a negative Lhermitte sign in her cervical spine, positive crossover maneuver.  LABORATORY DATA:  MRI reveals a full-thickness rotator cuff tear and AC degenerative changes with osteophytic spurring of the distal clavicle.  IMPRESSION: 1. Left rotator cuff tear with left shoulder acromioclavicular arthrosis. 2. Hypertension. 3. Diabetes mellitus with  associated neuropathy. 4. Foraminal stenosis. 5. Chronic pain syndrome in low back. 6. Severe anxiety. 7. Bipolar disorder.  PLAN:  The patient will be admitted to [facility] to undergo a left rotator cuff repair as well as distal clavicle resection per Kristine Oliver on  August 08, 2001, at 12:30 p.m.  The patient states that with her previous surgeries she was prone to have panic attacks.  Therefore, she states she will definitely need her anxiety medication.  If there are any problems concerning panic attacks, her psychotherapist, Kristine Oliver, should be immediately called, cell number is [phone], office number is [phone], extension [phone].  The patient also advises me that postoperatively if she is given morphine this will interfere with her voiding, and she wants to avoid a catheter if at all possible. Dictated by:   Jannette Fogo. Jenean Lindau, P.A.-C. Attending:  Johnn Hai, M.D. DD:  08/04/01 TD:  08/04/01 Job: [id number]

## 2010-12-19 NOTE — H&P (Signed)
[facility]  Patient:    Kristine Oliver                         MRN: [id number] Adm. Date:  [id number] Attending:  Irven Shelling                         History and Physical  CHIEF COMPLAINT:  Chest pain.  HISTORY OF PRESENT ILLNESS:  This is a 77 year old white female with history of  non-insulin-dependent diabetes mellitus and dyslipidemia who presents with chest pain.  She awoke this morning with mild pain in her upper epigastrium and lower  chest.  After eating breakfast, the pain became more severe and felt like something was pushing on her lower chest and upper abdomen.  It was associated with some nausea, diaphoresis, and shortness of breath.  She called 911.  By the time the  EMTs arrived, her pain was easing up.  She was given some nitroglycerin by the MT, and the pain improved further.  She got to the ER and was given another sublingual nitroglycerin, and her pain is now almost totally resolved.  She has been taking about 1200 mg of ibuprofen for the last two weeks for her chronic back and leg pain.  She denies any recent heartburn, dysphagia, odynophagia, abdominal pain,  melena, or bright red blood per rectum.  PAST MEDICAL HISTORY: 1. Diabetes mellitus, type 2. 2. Degenerative disk disease and spinal stenosis of the lumbosacral spine. 3. Dyslipidemia. 4. Oral and genital herpes. 5. Irritable bowel syndrome. 6. Primary hyperparathyroidism.  PAST SURGICAL HISTORY: 1. Pilonidal cyst. 2. Tonsillectomy. 3. Cholecystectomy. 4. Total abdominal hysterectomy/bilateral salpingo-oophorectomy.  ALLERGIES:  No known drug allergies.  CURRENT MEDICATIONS: 1. Avandia 4 mg a day. 2. Neurontin 600 mg b.i.d. 3. OxyContin 10 mg b.i.d. 4. Hydrocodone 1/2 t.i.d. and 1 p.m. 5. Premarin 1.25 mg a day. 6. Advil 2 t.i.d. p.r.n. 7. Ambien 5 mg q.h.s.  FAMILY HISTORY:  Father died of complications of diabetes, congestive  heart failure, history of stroke.  Mother alive in her 79s with a history of coronary  artery disease.  Sister with B12 deficiency and thyroid disease.  HABITS:  Smoking: None.  Drug and Alcohol: Occasional alcohol.  SOCIAL HISTORY:  She is divorced twice.  She has one son, age 64.  Occupation is former Public relations account executive at [employer].  She currently volunteers at [employer] with her church.  REVIEW OF SYSTEMS: Otherwise negative.  PHYSICAL EXAMINATION:  GENERAL:  She appears comfortable, lying supine.  VITAL SIGNS:  Blood pressure 178/61, lowered down to 135/57, heart rate 56, respirations 18, temperature 98.0.  Oxygen saturation 97% on room air.  HEENT:  Pupils are equal, round and respond to light.  Extraocular movements are intact.  Tympanic membranes are normal.  Oral cavity and pharynx clear.  NECK:  No bruits, no lymphadenopathy, no jugular venous distension.  LUNGS:  Clear.  HEART:  Regular rate and rhythm without murmur, gallop, or rub.  ABDOMEN:  Soft.  Mild epigastric tenderness on deep palpation.  There were no masses or hepatosplenomegaly.  PELVIC/RECTAL:  Deferred.  EXTREMITIES:  Show no edema with normal peripheral pulses.  NEUROLOGIC:  Nonfocal.  LABORATORY DATA:  Sodium 139, potassium 4.2, chloride 105, BUN 15.  CBC: Hemoglobin 11.5, WBC 8.0, platelet count 208.  Creatinine kinase 56, creatinine kinase MB .7.  Chest x-ray is pending.  EKG  shows sinus bradycardia, first degree A-V block, and no acute change.  ASSESSMENT: 1. Chest and epigastric pain, suspect gastrointestinal in origin secondary to    nonsteroidal use, rule out cardiac ischemia.  (Risk factors are age, diabetes    mellitus, type 2, and dyslipidemia.) 2. Diabetes mellitus, type 2. 3. Degenerative disk disease of the lumbosacral spine.  PLAN:  Observation, cardiac enzymes, nuclear study today, aspirin, hold on Motrin, Prevacid. DD:  06/12/99 TD:  06/12/99 Job:  [id number]

## 2010-12-19 NOTE — H&P (Signed)
NAME:  Kristine Oliver, Kristine Oliver                    ACCOUNT NO.:  [id number]   MEDICAL RECORD NO.:  [id number]                   PATIENT TYPE:  EMS   LOCATION:  ED                                   FACILITY:  [facility]   PHYSICIAN:  Peter M. Martinique, M.D.               DATE OF BIRTH:  06-19-1931   DATE OF ADMISSION:  08/20/2003  DATE OF DISCHARGE:                                HISTORY & PHYSICAL   HISTORY OF PRESENT ILLNESS:  Kristine Oliver is a 77 year old white female with a  history of diabetes mellitus type 2, hypertension, and bipolar disorder  presents to the emergency room today with the complaint of chest pain.  The  patient describes a 4 to 5 day history of chest pain.  She describes this as  a strange feeling that radiates across her chest but is mostly a localized  tightness of the left parasternal region associated with nausea, cold  sweats, headache, and shortness of breath and tremulousness.  She states the  pain comes and goes but may last up to an hour.  Over the past 24 hours, her  pain has become more intense, more frequent, and is lasting longer.  There  are no clear aggravating or relieving factors.  She does note that recently  she cannot walk very fast due to shortness of breath.  She does describe a  cardiac workup 4 or 5 years ago after she experienced severe indigestion.  Apparently, a stress test at that time was negative.   ALLERGIES:  She is ALLERGIC to PREDNISONE.   PAST MEDICAL HISTORY:  1. Bipolar disorder.  2. Anxiety disorder.  3. Hypertension.  4. Diabetes mellitus type 2.  5. Obesity.  6. Scoliosis.  7. Severe sciatica.  8. Status post back surgery x2.  9. Status post hysterectomy.  10.      Status post cholecystectomy.  11.      Status post D&C.  12.      Status post pilonidal cyst removal.   MEDICATIONS:  1. Avandia 4 mg daily.  2. OxyContin 10 mg b.i.d.  3. Oxycodone/APAP 5/325 mg 1/2 tablet b.i.d. p.r.n.  4. Altace 5 mg per day.  5. Vitamin C 500  mg per day.  6. Vitamin E 200 International Units daily.  7. Topamax 25 mg q.h.s.  8. Alprazolam 0.25 mg t.i.d.  9. Gabapentin 300 mg 4 tablets b.i.d.  10.      Aspirin 81 mg per day.  11.      Valtrex 500 mg daily.  12.      Artificial Tears daily.  13.      MiraLax 17 grams daily.  14.      Premarin 1.25 mg daily.  15.      Syntest H.S. one tablet daily.  16.      Lamictal 200 mg b.i.d.   SOCIAL HISTORY:  The patient lives alone.  She is divorced x2.  She has one  son.  She quit smoking in 1986.  She does drink an occasional glass of wine.   FAMILY HISTORY:  Father died at age 54 status post CVA.  Mother died at age  62 and had bypass surgery at age 50.  She has one sister who is alive and  well.   REVIEW OF SYSTEMS:  The patient reports chronic back and right leg pain.  She has chronic constipation.  She has anxiety attacks.  She has a history  of herpes simplex infection.  She denies any history of TIA or stroke.  She  has had no orthopnea, PND, or edema.  She has had no recent bowel or bladder  complaints.  No history of peripheral vascular disease or retinopathy.  Other review of systems are negative.   PHYSICAL EXAMINATION:  GENERAL:  The patient is an obese white female in no  apparent distress.  VITAL SIGNS:  Initial blood pressure was 200/100, is now 162/51, pulse is 55  and normal sinus rhythm.  She is afebrile.  Respirations are 20.  HEENT:  She wears glasses.  Her pupils equal, round, reactive to light and  accommodation.  Fundi are benign.  Conjunctivae are clear.  Oropharynx  reveals a complete upper dental plate and partial lower dental plate.  Her  oropharynx is otherwise clear.  NECK:  Supple without JVD, adenopathy, thyromegaly, or bruits.  LUNGS:  Clear to auscultation and percussion.  CARDIAC:  Regular rate and rhythm without gallops, murmurs, rubs, or clicks.  ABDOMEN:  Obese, soft, nontender.  There are no masses or  hepatosplenomegaly.  EXTREMITIES:   Femoral and pedal pulses are 2+ and symmetric.  She has no  edema.  SKIN:  Warm and dry.  BACK:  Spine reveals some degree of scoliosis with an old low back scar.  NEUROLOGICAL:  Nonfocal.  She is alert and oriented x4 and her affect is  ebullient.   LABORATORY DATA:  Chest x-ray shows no active disease.  ECG shows normal  sinus rhythm with lateral T-wave inversions.  White count 6800, hemoglobin  14.4, hematocrit 42.8, platelets 228,000.  Sodium 137, potassium 4.3,  chloride 110, CO2 23, BUN 15, creatinine 1, glucose of 110.  The rest of the  CMET was normal.  CK was 116 with 7.9 MB, troponin 0.21.   IMPRESSION:  1. Non-Q-wave myocardial infarction.  2. Diabetes mellitus type 2.  3. Hypertension.  4. Bipolar disease.  5. Obesity.   PLAN:  Admit to telemetry and obtain serial cardiac enzymes and ECG.  Will  check hemoglobin A1C and lipid panel.  Will load with aspirin.  Will treat  with IV nitroglycerin, subcu Lovenox, and IV Integrilin.  Will hold the beta-  blocker due to her resting bradycardia.  Anticipate cardiac catheterization  tomorrow.                                               Peter M. Martinique, M.D.    PMJ/MEDQ  D:  08/20/2003  T:  08/21/2003  Job:  [id number]   cc:   Delanna Ahmadi, M.D.  [address]    [place] [id number]  Fax: [phone]   Barnett Abu, M.D.  [address] [place]  [place] [address]  Fax: [phone]

## 2010-12-19 NOTE — Op Note (Signed)
[facility]  Patient:    Kristine Oliver, Kristine Oliver                 MRN: [id number] Proc. Date: 07/28/00 Adm. Date:  [id number] Attending:  Tye Savoy                           Operative Report  PREOPERATIVE DIAGNOSIS:  Spinal stenosis, epidural fibrosis L5-S1.  POSTOPERATIVE DIAGNOSIS:  Spinal stenosis, epidural fibrosis L5-S1.  Dural rent.  PROCEDURES PERFORMED:  Redo hemilaminotomy and foraminotomy L5 right.  Repair of dural rent.  ANESTHESIA:  General.  ASSISTANT:  Jessy Oto, M.D.  SURGEON:  Johnn Hai, M.D.  INDICATIONS:  A 77 year old status post diskectomy of L5-S1 with persistent radicular pain; temporary relief with selective nerve root block. Identifiable foraminal stenosis at L5 on the right, noted on the sagittal image of her MRI.  Operative intervention was indicated for decompression of L5 root by performing foraminotomy and possible hemilaminectomy of decompression of L5 root; also possible diskectomy.  In other words, extensive epidural fibrosis.  Risks and benefits were discussed including:  infection, bleeding, leakage of CSF epidural fibers, need for transfusion, etc.  DESCRIPTION OF PROCEDURE:  After adequate general anesthesia, 1 g Ancef IV for prophylaxis, the patient was placed prone on the [place] frame.  All bony prominences were well padded.  Lumbar region was prepped and draped in the usual sterile fashion.  The previous surgical incision was utilized; spinous process of L5 to spinous process of S1.  Subcutaneous tissue was then dissected.  Bovie cautery was utilized to achieve hemostasis.  Marcaine 0.25% with epinephrine was infiltrated in the subcutaneous tissue.  The dorsolumbar fascia was identified and divided in line with skin incision.  The curet was utilized to gently mobilize the epidural fibrosis at L5 and S1 on the right. This was when a Arts administrator was placed.  Operative microscope was  draped and brought into the surgical site.  With underlying elements well protected, high-speed bur was utilized to perform a hemilaminotomy on the caudate edge of L5.  This was then expanded with 2 mm Kerrison.  General mobilization of the sac medially, a small medial rent was noted in the dorsal aspect of the thecal sac (consistent with possibly the previous myelogram).  The exposure was extended; the dorsolumbar fascia was removed from the left side of the spinous process of L5 and S1.  Partial portion of L5 spinous process was removed to expose the rent (which was approximately 2 mm in length).  Two 4-0 nylons were utilized to close the rent.  Inspection following that revealed no CSF leakage.  Next, with the thecal sac gently mobilized medially, foraminotomy was performed with the 2 mm Kerrison at L5.  There was severe bony stenosis secondary to the hypertrophy of the superior articulate facet at S1, as well as ligament of flavum hypertrophy curving inward and compressing the L5 root. This was then removed with 2 mm Kerrison, decompressing the L5 root satisfactorily.   A hockey stick probe was placed in the foramen of L5 and found to be widely patent.  There was extensive epidural venous plexus noted, and electrocautery was utilized to achieve hemostasis.  The S1 foramina was patent and wide, without evidence of nerve root impingement of S1.  Additional inspection of the hard disk noted; no evidence of a free soft tissue disk or minimal diskectomy.  Then a hockey stick probe placed  in the foramen of L5 and S1 was found to be widely patent following decompression.  The wound was copiously irrigated.  No evidence of active bleeding.  The ______ was utilized for sealing of the region around the thecal sac following repair to it.  Again there was no evidence of CSF leakage.  Thrombin-soaked Gelfoam had been placed over top.  Collin retractors were removed. Paraspinous muscle was  inspected and noted no evidence of active bleeding. Next the wound was again copiously irrigated.  Dorsolumbar fascia was reapproximated with #1 Vicryl interrupted figure-of-eight sutures. Subcutaneous tissue was reapproximated with 2-0 Vicryl simple sutures.  Skin was reapproximated with staples.  The wound was dressed sterilely.  The patient placed supine on the hospital bed; extubated without difficulty, transported to the recovery room in satisfactory condition. DD:  07/28/00 TD:  07/28/00 Job: [id number]

## 2010-12-19 NOTE — Discharge Summary (Signed)
NAME:  Kristine Oliver, Kristine Oliver NO.:  [id number]   MEDICAL RECORD NO.:  [id number]          PATIENT TYPE:  INP   LOCATION:  [id number]                         FACILITY:  [facility]   PHYSICIAN:  Delanna Ahmadi, M.D.  DATE OF BIRTH:  [birth year]   DATE OF ADMISSION:  02/17/2008  DATE OF DISCHARGE:  02/21/2008                               DISCHARGE SUMMARY   REASON FOR ADMISSION:  A 77 year old white female who presented with  10/10 sudden-onset chest pressure radiating to the left arm and relieved  with nitroglycerin, lasting approximately 1 hour associated with  shortness of breath and clamminess.   SIGNIFICANT FINDINGS:  VITAL SIGNS:  Temperature afebrile.  Blood  pressure 120/70, heart rate 49, and respirations 18.  LUNGS:  Clear.  HEART:  Regular rate and rhythm without murmur, gallop, or rub.  EXTREMITIES:  No edema.   LABORATORY:  CBC normal.  CMP notable only for blood sugar 151.  Cardiac  enzymes; troponin was 0.05, CPK and MB normal.  BNP 197.  Chest x-ray,  no acute disease.  EKG, sinus bradycardia, first-degree AV block.  No  acute ST or T-wave changes.   HOSPITAL COURSE:  The patient was admitted for episode of chest pain  worrisome for angina.  There was one EKG strip, which may have shown  transient ST-segment elevation.  She was placed on Lovenox, as well as  aspirin and Plavix were continued.  The patient was seen by  Echocardiology.  She ruled out for an MI.  She had no further chest pain  during the hospitalization.  She underwent cardiac catheterization on  February 20, 2008, which showed LAD ostial 37% lesion, widely patent mid LAD  stent was placed.  It was patent.  Left circumflex system, OM1 widely  patent and OM2 luminal irregularities.  RCA 25% proximal lesion.  LV  gram normal LV function.  There was a right renal 60-70% proximal  stenosis.  The patient's chest pain was considered to be noncardiac and  she was discharged in good condition.   DISCHARGE  DIAGNOSES:  1. Chest pain.  2. Coronary artery disease.  3. Diet-controlled diabetes.  4. Hypertension.  5. Bipolar disorder.   PROCEDURES:  Cardiac catheterization.   DISCHARGE MEDICATIONS:  1. Aspirin 81 mg daily.  2. Xanax 0.25 mg t.i.d.  3. Simvastatin 20 mg daily.  4. Lamictal 200 mg daily.  5. Plavix 75 mg daily.  6. Premarin 1.25 mg daily.  7. Neurontin 3 mg daily.  8. Topamax 100 mg daily.  9. Ambien 10 mg at bedtime p.r.n.  10.Altace 5 mg daily.   DISPOSITION:  Discharged to home.   DIET:  Low-sodium diabetic diet.   ACTIVITY:  As tolerated.   FOLLOW UP:  Follow up with Dr. Leonia Reeves on March 06, 2008, at 1 p.m.  Dr.  Laurann Montana at her next scheduled appointment.           ______________________________  Delanna Ahmadi, M.D.     JJG/MEDQ  D:  02/22/2008  T:  02/22/2008  Job:  [id number]   cc:   Desiree Lucy.  Leonia Reeves, M.D.

## 2010-12-19 NOTE — Discharge Summary (Signed)
[facility]  Patient:    Kristine Oliver, Kristine Oliver Visit Number: [id number] MRN: [id number]          Service Type: SUR Location: 4W 0484 01 Attending Physician:  Tye Savoy Dictated by:   Jenetta Loges, P.A. Admit Date:  08/08/2001 Discharge Date: 08/10/2001                             Discharge Summary  ADMISSION DIAGNOSES: 1. Left rotator cuff tear and acromioclavicular arthrosis. 2. Bipolar disorder. 3. Diabetes mellitus with associated neuropathy. 4. History of foraminal stenosis. 5. Hypertension. 6. Chronic pain syndrome of the lower back. 7. Severe anxiety.  DISCHARGE DIAGNOSES: 1. Left rotator cuff tear and acromioclavicular arthrosis. 2. Bipolar disorder. 3. Diabetes mellitus with associated neuropathy. 4. History of foraminal stenosis. 5. Hypertension. 6. Chronic pain syndrome of the lower back. 7. Severe anxiety. 8. Status post rotator cuff repair and acromionectomy and disk clavicle on the    left.  OPERATIONS:  Left disk clavicle resection, acromionectomy, and rotator cuff repair; surgeon Dr. Tonita Cong, assistant Peter Congo, P.A.-C. under general anesthesia.  BRIEF HISTORY:  Kristine Oliver is a 77 year old female with significant left shoulder pain for about a year that has been evaluated and scheduled for surgery; however, has been cancelled due to fear of flare of her bipolar disorder postoperatively.  Also, she had one other urinary tract infection and yeast infection that delayed her surgery; however, she presents now with significant debilitating pain of her left shoulder and inability to raise her shoulder above her head.  She wishes to proceed at this time for left rotator cuff repair.  HOSPITAL COURSE:  The patient was admitted and underwent the above-mentioned procedure and tolerated this well.  All appropriate IV antibiotics and analgesics were utilized.  Postoperatively, her pain was well controlled.   She had an interscalene block which helped.  Discharge planning was ordered.  The patient continued to refuse any lab sticks and became ugly to staff and in denial on several accounts; however, demanded that she have home health.  We did order discharge planning to assist with this within the realms of her Medicare and insurance supplements.  On date August 10, 2001, she was afebrile, vital signs were stable.  She was neurovascularly intact.  Her incision was clean and dry.  She was alert and oriented; however, sedated from her medications.  At this time, she was not requiring any IV analgesics and was felt stable for discharge to home with arrangements made at discharge planning for home needs.  She, at this time, was neurovascularly intact and discharge instructions were given to the patient.  LABORATORY DATA:  Admission hemoglobin 12.2, hematocrit 36.8.  Urinalysis normal.  Chemistries within normal limits except for a calcium of 11.  EKG:  Normal sinus rhythm and first-degree AV block noted, otherwise normal.  CONDITION ON DISCHARGE:  Stable and improved.  DISCHARGE MEDICATIONS AND PLANS:  The patient is being discharged to home. Home health needs arranged for PT and bath aid.  No use of shoulder, sling at all times.  Prescriptions for Percocet 1-2 every 4-6 p.r.n. pain and Robaxin 500 mg 1 every eight hours p.r.n. spasm.  Call for a followup appointment two weeks postoperatively.  May shower at five days postop.  Resume home medications, home diet. Dictated by:   Jenetta Loges, P.A. Attending Physician:  Tye Savoy DD:  09/05/01 TD:  09/06/01 Job: [id number]

## 2010-12-19 NOTE — Op Note (Signed)
[facility]  Patient:    Kristine Oliver, Kristine Oliver Visit Number: [id number] MRN: [id number]          Service Type: SUR Location: 4W [place] 01 Attending Physician:  Tye Savoy Dictated by:   Johnn Hai, M.D. Proc. Date: 08/08/01 Admit Date:  08/08/2001                             Operative Report  PREOPERATIVE DIAGNOSES: 1. Rotator cuff tear. 2. Acromioclavicular arthrosis. 3. Impingement syndrome left shoulder.  POSTOPERATIVE DIAGNOSES: 1. Rotator cuff tear. 2. Acromioclavicular arthrosis. 3. Impingement syndrome left shoulder.  PROCEDURES PERFORMED:  Left shoulder open rotator cuff repair, massive distal clavicle resection, acromioplasty, bursectomy.  ANESTHESIA:  General.  SURGEON:  Johnn Hai, M.D.  ASSISTANT:  Peter Congo, P.A.-C.  BRIEF HISTORY AND INDICATION:  A 77 year old with rotator cuff tear, AC arthrosis, acromial spur.  Operative intervention was indicated for repair and decompression of the subacromial space.  Risks and benefits discussed including bleeding, infection, damage to vascular structures, recurrent tear, suboptimal range of motion, etc.  TECHNIQUE:  Patient supine in beach chair position.  After induction of adequate general anesthesia, 1 g Kefzol, the left shoulder and upper extremity was prepped and draped in the usual sterile fashion.  Incision was made over the anterior acromion in Langers lines.  Subcutaneous tissue was dissected.  Electrocautery was utilized to achieve hemostasis.  The raphe between the anterolateral heads of the deltoid was divided.  The capsule over the [facility] joint was incised.  The distal clavicle was skeletonized approximately 1 cm.  Rotator cuff underneath protected.  An oscillating saw utilized to remove approximately 7 mm of the distal clavicle.  Inferior spur was removed with a Kerrison rongeur.  There was a spur on the acromion side that was removed with Kerrison as  well.  The deltoid was subperiosteally elevated from the anterior aspect of the acromion with an AO elevator.  Oscillating saw utilized to perform an acromioplasty which was further contoured with a Estill Cotta rongeur, decompressing the subacromial space.  Full bursectomy was performed and noted beneath that was an extensive multiplanar rotator cuff tear.  This was essentially along the lines of the supraspinatus and subscapularis.  The edges were freshened with a blade with good bleeding tissue noted.  The rotator cuff was digitally mobilized and repaired side-to-side with #1 Vicryl and #1 Ethibond interrupted figure-of-eight sutures.  Good approximation of the tissue was appreciated.  The trough was manufactured with a Baer rongeur distally, and this was secured to this with #1 Ethibond interrupted figure-of-eight sutures through the bone.  This was pulled to the greater tuberosity.  Bursa was pulled over top of the repair site and reinforced to reinforce the repair.  After this, she had a good range without tension noted on the wound with good closure of the rotator cuff.  There was adequate space in the subacromial space by range of motion.  No impingement underneath the distal clavicle as well.  The wound was copiously irrigated with antibiotic irrigation.  Next, capsule over the [facility] joint was repaired with #1 Vicryl interrupted figure-of-eight sutures.  The raphe of the anterior and lateral heads of the deltoid were repaired with the same, and the deltoid sleeve to the anterior aspect of the acromion.  The trapezoid and deltoid fascia was repaired as well.  Subcutaneous tissue reapproximated with 2-0 Vicryl simple sutures.  The skin was reapproximated with  staples.  The wound was dressed sterilely, patient placed in abduction pillow, extubated without difficulty, and transported to the recovery room in satisfactory condition.  The patient tolerated the procedure well with no known  complications.  Blood loss was minimal. Dictated by:   Johnn Hai, M.D. Attending Physician:  Tye Savoy DD:  08/08/01 TD:  08/08/01 Job: [id number]

## 2010-12-19 NOTE — Discharge Summary (Signed)
[facility]  Patient:    Kristine Oliver, Kristine Oliver                 MRN: [id number] Adm. Date:  [id number] Disc. Date: [id number] Attending:  Tye Savoy Dictator:   Alexzandrew L. Dara Lords, P.A.-C. CC:         Drue Stager. Williford, M.D.   Discharge Summary  ADMISSION DIAGNOSES: 1. Right L5 stenosis. 2. Diabetes mellitus. 3. Hypertension.  DISCHARGE DIAGNOSES: 1. 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 2. Postoperative urinary retention, improved. 3. Panic attack, resolved. 4. Hypertension. 5. Diabetes mellitus. 6. Bipolar disorder.  CONSULTATIONS: 1. Psychiatry, Dr. Rhona Raider. 2. Internal medicine, unable to read signature. 3. Urology, Dr. Joelyn Oms.  PROCEDURE:  The patient was taken to the [facility] on July 28, 2000, underwent a redo hemilaminotomy and foraminotomy of the right L5 region, and also repair of a dural rent.  Surgeon was Dr. Basil Dess, assistant was Dr. Susa Day.  Surgery was under general anesthesia.  HISTORY OF PRESENT ILLNESS:  The patient is a 77 year old female with a two year history of low back pain and right lower extremity pain.  She has been seen and evaluated by Dr. Tonita Cong for known history of lumbar spondylosis and degenerative scoliosis.  She has previously had a diskectomy at L5-S1, however, had been seen recently for increasing pain in the right lower extremity and the low back.  She underwent a consultation by Dr. Nelva Bush, and underwent a selected nerve root block, however, only eliminated her pain for a couple of hours.  She was sent for an MRI which did show moderate stenosis at L4-5.  It was felt that she could benefit from undergoing surgical intervention.  Risks and benefits of the procedure were discussed, and she accepted these going into surgery.  LABORATORY DATA:  Hemoglobin and hematocrit preoperatively  showed a hemoglobin of 12.1, hematocrit of 35.5, postoperatively, 12.8 and 36.3, respectively. Preoperative BMET all within normal limits with a slightly elevated calcium at 11.3.  Followup BMET showed glucose elevated from 88 to 181, remaining BMET all within normal limits.  Urinalysis on admission was negative.  Followup UA on July 29, 2000, negative.  Urine culture on July 29, 2000, negative.  EKG dated July 22, 2000, marked sinus bradycardia, minimal voltage criteria for left ventricular hypertrophy, may be normal variant.  Heart rate decreased, left ventricular hypertrophy new, and first degree AV block absent since June 12, 1999.  Confirmed by Dr. Kirk Ruths.  Chest x-ray dated July 22, 2000, prominent markings without acute abnormalities of the chest.  Intraoperative lumbar films taken on July 28, 2000, showed posterior localization of the L5-S1 disk space.  HOSPITAL COURSE:  The patient was admitted to [facility], taken to the OR, underwent the above stated procedure without complication.  The patient tolerated the procedure well, later transferred to the recovery room and then to the orthopedic floor for continued postoperative care.  She was followed very closely.  However, on the night after surgery she had difficulty with pain, and became somewhat belligerent with the staff.  It was later determined that the patient had experienced a panic attack on the evening of the surgery.  Medications were adjusted.  Hemoglobin and hematocrit and BMET were checked the following day as above.  The patient stated she had recently within the past few weeks been diagnosed with an atypical bipolar disorder.  Internal medicine consult was called in due to her hypertension and diabetes, and also her panic attack.  Medications were adjusted.  She was placed on Xanax as per and also Ativan IV for severe anxiety.  She was started back on her home medications,  including Altace and Neurontin.  She did start to improve with the medication adjustment.  It was also noted postoperatively the patient had difficulty voiding, and had some urinary retention requiring in-and-out cath.  Urology consult was also called.  The patient was seen and evaluated by Dr. Joelyn Oms.  She was started on Urecholine on postoperative day #1.  By postoperative day #2, this did stimulate her bowels.  She started voiding without difficulty.  Due to the recent diagnosis of bipolar disorder, a psychiatric evaluation was called.  The patient was seen in consultation by Dr. Rhona Raider in house.  Dr. Rhona Raider recommended changes in medications with an increase of Neurontin and to limit the use of the benzodiazepines.  Panic attack did resolve after the first day.  The patient did improve with the medication adjustments during the hospital course.  She also did improve from a voiding standpoint.  By July 31, 2000, the patient was doing much better, panic attacks had resolved, she was voiding okay.  She had been up ambulating.  Was taking p.o. analgesics.  It was decided that the patient could be discharged home at that time.  DISCHARGE PLAN:  The patient is discharged home on July 31, 2000.  DISCHARGE DIAGNOSES:  Please see above.  DISCHARGE MEDICATIONS: 1. OxyContin 20 mg p.o. q.12h. sustained release for pain. 2. Oxycodone 5 mg p.r.n. for breakthrough pain. 3. Peri-Colace #60 b.i.d. 4. Recommended vitamin C 500 mg p.o. q.d. over-the-counter. 5. Xanax.  The patient does have this medication at home.  We will refill as    per medicine services. 6. Resume her Altace, Neurontin, and Avandia for diabetes.  DIET:  As tolerated, diabetic diet.  FOLLOWUP:  In two weeks.  ACTIVITY:  Up as tolerated, back precautions.  DISPOSITION:  Home once she was moving her bowels.  CONDITION ON DISCHARGE:  Improved.  Her Neurontin had been increased up to 600 mg p.o. t.i.d.  This  was an increase of her home dose.DD:  09/01/00 TD:  09/01/00 Job: [id number]

## 2010-12-19 NOTE — Discharge Summary (Signed)
NAME:  AFSA, BRUNKEN                    ACCOUNT NO.:  [id number]   MEDICAL RECORD NO.:  [id number]                   PATIENT TYPE:  OBV   LOCATION:  [id number]                                 FACILITY:  [facility]   PHYSICIAN:  Barnett Abu, M.D.               DATE OF BIRTH:  30-Mar-1931   DATE OF ADMISSION:  08/20/2003  DATE OF DISCHARGE:  08/22/2003                                 DISCHARGE SUMMARY   CHIEF COMPLAINT AND REASON FOR ADMISSION:  Ms. Lupoli is a 77 year old white  female patient with known diabetes and hypertension who presented to the ER  with a complaint of chest pain occurring for 4-5 days.  This chest pain was  strange per patient's report that radiated across her chest with localized  tightness in the left parasternal region.  The pain was associated with  nausea, cold diaphoresis, headache, shortness of breath and tremulousness.  The patient describes the pain as being intermittent but when it occurs it  lasts up to an hour.  Over the past 24 hours prior to admission, her pain  had become more intense and more frequent with longer duration without any  clear aggravating or relieving factors.  She also notes that over the past  two weeks she has been unable to walk very fast due to increasing shortness  of breath.  Per her report, she had a negative cardiac workup 4-5 years  prior, after she experienced what she describes as severe indigestion, and  apparently stress test was negative at that time.   On initial evaluation, the patient's initial blood pressure was 200/100 down  to 162/51 and by the time Dr. Martinique evaluated her.  She was sinus  bradycardic with a pulse of 55 without any clear distress otherwise.  My  exam was unremarkable, as well.   Initial chest x-ray was normal.  EKG showed sinus rhythm with lateral T wave  inversions.  White count 6800, hemoglobin 14.4, platelets 228,000.  Sodium  137, potassium 4.3, chloride 110, BUN 15, creatinine 1.0.  Troponin  was 0.21  with a CK of 116 and an MB of 7.9.  This patient was admitted with the  following diagnoses.   ADMISSION DIAGNOSES:  1. Non-Q wave myocardial infarction.  2. Type 2 diabetes.  3. Hypertension.  4. Bipolar disorder.  5. Obesity.   HOSPITAL COURSE:  #1.  Non-Q wave myocardial infarction.  The patient was  admitted to the medical telemetry unit where serial cardiac isoenzymes were  performed, as were serial EKG's.  She was treated with IV nitroglycerin,  subcutaneous Lovenox, and IV Integrilin.  Beta blockers were not given, due  to resting bradycardia.  Dr. Leonia Reeves was notified of the patient's admission  and probable need for cardiac catheterization.  It is important to note  repeat troponin was 0.36, CK-MB 6.8.  EKG remained unchanged with inverted  anterior T waves.  The patient was subsequently taken to the  cardiac  catheterization laboratory on August 21, 2003, per Dr. Leonia Reeves.  Catheterization revealed normal left ventricular function with an EF of 65%.  LAD had a mid-diffuse 95% lesion.  The patient subsequently underwent PCI,  and placement of TAXUS stent.  After PCI, the lesion was down to 0%.  Other  arteries had no significant obstruction, per Dr. Leonia Reeves note.  The patient  tolerated the catheterization procedure well, and she has good hemostasis  with an intact distal pulse.   Post catheterization, the patient had difficulty laying still immediately  post-cath in the cardiac recovery area, as well as after transport to unit  6500.  She continued to lift her head, moving her hips, and complaining of  inability to use the bed pan, but refusing a Foley catheter.  At the same  time, she was screaming out in pain that she was very uncomfortable.  Blood  pressure went up, and the patient subsequently developed hematoma in the  right groin, level 3.  Please see cardiac catheterization for note for  additional details regarding the hematoma and its development.  After  the  patient was transferred to 6500, the hematoma remained stable.  Eventually,  the patient relented and allowed a Foley to be put in place.  The bladder  was decompressed, and, after relaxation, the patient's blood pressure went  down to within normal ranges, and actually after she fell asleep, pressure  went into the mid-80's.  She was given a fluid challenge, and blood pressure  stabilized.  By the following morning, the patient was complaining of pain  related to the Foley catheter, so this was removed.  The right groin  remained stable without any bruit auscultated.  She continued with  ecchymosis and a soft hematoma without oozing out of this area.  Distal  pulse remained intact.  Dr. Leonia Reeves described the hematoma area as being  mostly ecchymosis, and otherwise cleared the patient for discharge home.   #2.  All other chronic medical problems remained stable throughout the  hospitalization.   LABORATORY DATA:  Hemoglobin A1C was checked, and was 5.6.  Lipid panel was  checked.  Cholesterol was 196, triglycerides 219, HDL cholesterol 25, and  LDL 127.  At the time of discharge, BMET was obtained.  Sodium 134,  potassium 3.7, glucose 255, BUN 10, creatinine 1.1.  CBC was also checked.  The patient has mild anemia.  Hemoglobin was 10.2, with a hematocrit of  30.1.  White count was normal, and platelet count was 162,000.   FINAL DISCHARGE DIAGNOSES:  1. Non-Q wave myocardial infarction.  2. Coronary artery disease status post placement of TAXUS stent to the left     anterior descending.  3. Right groin ecchymosis with small hematoma formation, stable.  4. Dyslipidemia.  5. Diabetes mellitus, controlled.  6. Chronic pain with bipolar disorder.   DISCHARGE MEDICATIONS:  1. Aspirin 325 mg daily (this is a new medicine).  2. Plavix 75 mg daily (this is a new medicine).  Due to acute MI, the     patient will need to continue this medicine for a minimum of 9-12 months. 3. Avandia 4  mg daily.  4. OxyContin 10 mg twice daily.  5. Oxycodone/APAP 5/325 one-half tablet twice daily as needed.  6. Altace 5 mg daily.  7. Vitamin C 500 mg daily.  8. Vitamin E 200 IU daily.  9. Topamax 25 mg h.s.  10.      Xanax 0.25 mg t.i.d.  11.  Gabapentin 300 mg four tablets b.i.d.  12.      Valtrex 500 mg daily.  13.      Artificial tears daily.  14.      MiraLax 17 gm daily.  15.      Premarin 1.25 mg daily.  16.      Syntest HS one daily.  17.      Lamictal 200 mg twice daily.  18.      Zocor 20 mg daily (this is a new medication).  19.      Niaspan 500 mg h.s. (this is a new medication).   PAIN MANAGEMENT:  Continue as previous.  Continue her previous medicines.  Refills of narcotic medications will be deferred to primary care physician.   ACTIVITY:  No bending, stooping, straining, or lifting greater than 5 pounds  for the next 2 days.   DIET:  Cardiac diabetic.  Low-fat, low-cholesterol, low-salt with  carbohydrate modifications.   WOUND CARE:  Shower only for the next 2 days.   ADDITIONAL INSTRUCTIONS:  If the patient has any questions, development of  chest pain, or worsening of the groin hematoma, she is to call Dr. Leonia Reeves'  R.N., Hinton Dyer, at [phone].   FOLLOW UP APPOINTMENTS:  She has an appointment to see Dr. Leonia Reeves on  Friday, September 07, 2003, at 2 p.m.  She will need a statin panel rechecked  in 6 weeks.     [place] Joycelyn Man                    Barnett Abu, M.D.    ALE/MEDQ  D:  10/17/2003  T:  10/20/2003  Job:  [id number]

## 2010-12-19 NOTE — H&P (Signed)
[facility]  Patient:    Kristine Oliver, Kristine Oliver                        MRN: [id number] Adm. Date:  07/28/00 Attending:  Johnn Hai, M.D. Dictator:   Alexzandrew L. Perkins, P.A.-C.                         History and Physical  DATE OF OFFICE VISIT HISTORY AND PHYSICAL:  July 20, 2000.  CHIEF COMPLAINT:  Right leg pain.  HISTORY OF PRESENT ILLNESS:  Patient is a 77 year old female with a two-year history of low back pain and right lower extremity pain.  She has been seen and evaluated by Dr. Johnn Hai and is known to have a history of lumbar spondylosis with some degenerative scoliosis.  She has previously had a diskectomy at L5-S1; however, has been seen recently for increasing pain of the right lower extremity and in the low back.  She has been seen in consultation by Dr. Janice Coffin. Ramos and has undergone a selective nerve root block.  The selective nerve root block did eliminate her pain for approximately a couple of hours; however, as it wore off, the pain did return. She has undergone MRI and found to have moderate stenosis at L4 and L5.  She has been on narcotic analgesics for some time now for pain control.  It is felt due to the findings on MR and also the diagnostic indication from the selective nerve root block, that she would benefit from undergoing a hemilaminectomy and foraminotomy at the level of L5.  Risks and benefits of this procedure have been discussed with the patient; she has elected to proceed with surgery.  ALLERGIES:  No known drug allergies.  INTOLERANCES:  PREDNISONE causes GI upset.  VICODIN causes GI upset.  CURRENT MEDICATIONS:  Neurontin, Premarin, Altace, Avandia, aspirin 81 mg, OxyContin 20 mg p.o. b.i.d. and Percocet p.r.n. pain.  Patient does not know all of her dosages; will bring to the hospital.  PAST MEDICAL HISTORY:  Diabetes mellitus.  History of bronchitis. Hypertension.  PAST SURGICAL HISTORY:   Pilonidal cyst excision.  Hysterectomy.  Gallbladder surgery.  Previous back surgery.  SOCIAL HISTORY:  Patient had a prior 35-year history of smoking; however, denies tobacco products or alcohol products at this time.  She lives alone.  FAMILY HISTORY:  Father deceased with a history of stroke, diabetes and heart disease.  Mother deceased with a history of heart disease.  REVIEW OF SYSTEMS:  GENERAL:  No fevers, chills or night sweats.  NEUROLOGIC: No seizures, syncope or paralysis.  RESPIRATORY:  No shortness of breath, productive cough or hemoptysis.  CARDIOVASCULAR:  No chest pain, angina or orthopnea.  GI:  No nausea, vomiting, diarrhea or constipation.  GU:  No dysuria, hematuria or discharge.  MUSCULOSKELETAL:  Pertinent to back and the right leg found in the history of present illness.  PHYSICAL EXAMINATION  VITAL SIGNS:  Pulse 60, respirations 16, blood pressure 118/77.  GENERAL:  Patient is a 77 year old female, well-nourished, well-developed, who appears to be alert, oriented and cooperative at the time of exam.  She is somewhat mildly anxious at exam.  HEENT:  Normocephalic, atraumatic.  Pupils round and reactive.  Oropharynx is clear.  NECK:  Supple.  No carotid bruits.  CHEST:  Clear to auscultation and percussion.  HEART:  Regular rate and rhythm.  No murmurs appreciated.  ABDOMEN:  Soft and nontender.  Bowel sounds present.  RECTAL:  Not done, not pertinent to present illness.  BREASTS:  Not done, not pertinent to present illness.  GENITALIA:  Not done, not pertinent to present illness.  EXTREMITIES:  Significant for that to the back and lower extremities. Straight leg raise produces buttock and lateral thigh pain, which also radiates down into the calf.  EHL 5/5 on the right with some decreased sensation on the plantar and dorsal aspects of the foot.  Pulses are noted to be intact.  IMPRESSION:  Right L5 stenosis.  PLAN:  Patient will be admitted to  [facility] to undergo a right hemilaminectomy with a right L5 foraminotomy with possible L5-S1 diskectomy. Surgery will be performed by Dr. Susa Day. DD:  07/23/00 TD:  07/24/00 Job: [id number]

## 2010-12-19 NOTE — Cardiovascular Report (Signed)
NAME:  Kristine Oliver, Kristine Oliver                    ACCOUNT NO.:  [id number]   MEDICAL RECORD NO.:  [id number]                   PATIENT TYPE:  OBV   LOCATION:  6525                                 FACILITY:  [facility]   PHYSICIAN:  Barnett Abu, M.D.               DATE OF BIRTH:  [birth year]   DATE OF PROCEDURE:  08/21/2003  DATE OF DISCHARGE:                              CARDIAC CATHETERIZATION   PROCEDURES PERFORMED:  1. Left heart catheterization.  2. Coronary angiography.  3. Left ventriculogram.  4. Percutaneous coronary intervention/drug-eluting stent implantation mid     left anterior descending.   INDICATION:  Kristine Oliver is a 77 year old woman who was admitted yesterday  after several days of prolonged intermittent chest pain.  She ruled in for a  non-ST segment elevation myocardial infarction.  Her pain was relieved with  IV nitroglycerin, Lovenox and integrilin.  She is brought now to the cardiac  catheterization laboratory to identify the extent of disease and provide for  further therapeutic options.   PROCEDURAL NOTE:  The patient was brought to the cardiac catheterization  laboratory in the fasting state.  The right groin was prepped and draped in  the  usual sterile fashion.  Local anesthesia was obtained with the  infiltration of 1% lidocaine.  An 6-French catheter sheath was inserted  percutaneously into the right femoral vein utilizing an anterior approach  over a guiding J wire.  In a similar fashion, a 5 [place] catheter sheath was  inserted into the right femoral vein for intravenous access.  A 110 cm  pigtail catheter was used to measure pressures in the ascending aorta and in  the left ventricle both prior to and following the ventriculogram.  A 30-  degree RAO cine left ventriculogram was performed utilizing a power  injector.  Coronary angiography was then performed using 6 [place] #4 left  and right Judkins catheters.  Cine angiography of each coronary artery  was  conducted in multiple LAO and RAO projections.  All catheter manipulations  were performed using fluoroscopic observation and exchanges performed over  long guiding J wire.   We then proceed with coronary intervention.  A 6 French #3.5 CLS guiding  catheter was advanced to the ascending aorta where the left coronary os was  engaged.  The patient received 4800 units of heparin intravenously.  A 0.014-  inch Scimed Luge intracoronary guide wire was passed across the lesion in  the mid LAD without difficulty.  Initial balloon dilatation was performed  with a 2.5/30-mm Scimed Maverick intracoronary balloon.  It was inflated to  5 atmospheres for approximately 90 seconds.  This balloon was deflated and  removed.  A 2.7/28-mm Taxus Express 2 intracoronary drug-eluting stent was  then passed and positioned carefully in the mid LAD.  It was inflated to a  peak pressure of 9 atmospheres for 37 seconds.  The stent balloon was then  deflated and removed.  A  3.0/15-mm Quantum Maverick was then moved into  place in the proximal portion of the stent.  It was inflated to 12  atmospheres for 43 seconds.  These maneuvers resulted in wide patency of the  anterior descending artery.  This was confirmed in orthogonal views both  with and without the guide wire in place.  The guiding catheter was then  removed.  Hemostasis was achieved by suturing the sheath in place.  The  patient was transported to the recovery area in stable condition with intact  distal pulse.   The initial ACT was 326 seconds, down to 270 seconds at the completion of  the procedure.  The patient did receive 300 mg of p.o. Plavix at the  completion of the procedure as well.   HEMODYNAMICS:  Systemic arterial pressure was elevated at 181/71 with a mean  of 111 mmHg.  There was no systolic gradient across the aortic valve.  The  left ventricular end-diastolic pressure was 27 mmHg.   ANGIOGRAPHY:  The left ventriculogram  demonstrated normal chamber size and  normal global systolic function without regional wall motion abnormality.  Visual estimate of the ejection fraction is 65-70%.  There is minimal left  coronary calcification seen.  No right coronary calcification.  The aortic  valve is trileaflet and opens normally.  The mitral valve is without any  evidence of regurgitation.   There was a right dominant coronary system present.  The main left coronary  artery was normal.   The left anterior descending artery and its branches were highly diseased;  the vessel contains a mid to distal 95% stenosis, but the lesion is diffuse  over approximately 20-22 mm beginning before the second small diagonal  branch and extending to beyond the second  septal perforator.  The ongoing  anterior descending artery is moderate is size, reaches, but does not  traverse the apex.  There is a third small diagonal branch without  significant obstruction.   The left circumflex coronary artery and its branches are  without  significant obstruction.  There is a very small first marginal branch.  The  second marginal is the dominant vessel on the lateral wall of the heart and  it subdivides into an superior and inferior branch.  Prior to this  bifurcation, there is a tubular 30% stenosis.   The right coronary artery and its branches are, again, minimally diseased.  There is a 30% proximal stenosis and a 20% mid vessel stenosis.  The ongoing  vessel gives rise to a small posterior descending artery and a moderate size  posterior lateral segment.  The posterior lateral segment gives rise to  three left ventricular branches.  The first of which is quite large. In  fact, it is larger than the posterior descending artery.  The second and  third of which are small in size.   Following balloon dilatation and stent implantation in the anterior descending artery, no residual stenosis is seen.   FINAL IMPRESSION:  1.  Atherosclerotic coronary vascular disease, single vessel.  2. Status post NSTEMI, anterior.  3. Intact left ventricular size and global systolic function.  4. Status post drug-eluting stent implantation mid left anterior descending.                                               Barnett Abu, M.D.  JHE/MEDQ  D:  08/21/2003  T:  08/22/2003  Job:  [id number]   cc:   Delanna Ahmadi, M.D.  [address] [address]  Fax: [phone]

## 2011-04-30 LAB — CBC
MCHC: 34.1
MCV: 93.7
Platelets: 207
RBC: 4.61
RDW: 13.7

## 2011-04-30 LAB — POCT I-STAT, CHEM 8
Chloride: 111
HCT: 45
Hemoglobin: 15.3 — ABNORMAL HIGH
Potassium: 4.9

## 2011-04-30 LAB — DIFFERENTIAL
Basophils Absolute: 0.1
Basophils Relative: 1
Eosinophils Absolute: 0.3
Neutro Abs: 8 — ABNORMAL HIGH
Neutrophils Relative %: 74

## 2011-05-01 LAB — POCT CARDIAC MARKERS
CKMB, poc: 1.2
Myoglobin, poc: 62.9
Myoglobin, poc: 76.8
Operator id: 192351

## 2011-05-01 LAB — COMPREHENSIVE METABOLIC PANEL
ALT: 17
Albumin: 3.3 — ABNORMAL LOW
Alkaline Phosphatase: 114
GFR calc Af Amer: 53 — ABNORMAL LOW
Glucose, Bld: 151 — ABNORMAL HIGH
Potassium: 4.8
Sodium: 135
Total Protein: 6

## 2011-05-01 LAB — URINALYSIS, ROUTINE W REFLEX MICROSCOPIC
Bilirubin Urine: NEGATIVE
Ketones, ur: NEGATIVE
Nitrite: NEGATIVE
Specific Gravity, Urine: 1.021
Urobilinogen, UA: 0.2
pH: 5

## 2011-05-01 LAB — CBC
HCT: 38.2
HCT: 39.2
HCT: 40.6
Hemoglobin: 12.9
MCHC: 32.9
MCHC: 33.9
MCV: 94.1
MCV: 94.3
Platelets: 184
RBC: 4.32
RDW: 13.8
RDW: 14.1
WBC: 9.5

## 2011-05-01 LAB — DIFFERENTIAL
Basophils Relative: 1
Eosinophils Absolute: 0.3
Eosinophils Relative: 4
Monocytes Absolute: 0.6
Monocytes Relative: 7

## 2011-05-01 LAB — LIPID PANEL
LDL Cholesterol: 60
Total CHOL/HDL Ratio: 4.6
Triglycerides: 151 — ABNORMAL HIGH
VLDL: 30

## 2011-05-01 LAB — BASIC METABOLIC PANEL
BUN: 13
Chloride: 112
Glucose, Bld: 101 — ABNORMAL HIGH
Potassium: 3.9

## 2011-05-01 LAB — CARDIAC PANEL(CRET KIN+CKTOT+MB+TROPI): Total CK: 67

## 2011-05-01 LAB — URINE MICROSCOPIC-ADD ON

## 2012-02-10 ENCOUNTER — Emergency Department (HOSPITAL_COMMUNITY): Payer: Medicare Other

## 2012-02-10 ENCOUNTER — Emergency Department (HOSPITAL_COMMUNITY)
Admission: EM | Admit: 2012-02-10 | Discharge: 2012-02-11 | Disposition: A | Discharge status: Home / Self Care | Payer: Medicare Other | Attending: Cardiology | Admitting: Cardiology

## 2012-02-10 ENCOUNTER — Encounter (HOSPITAL_COMMUNITY): Payer: Self-pay | Admitting: Emergency Medicine

## 2012-02-10 DIAGNOSIS — R110 Nausea: Secondary | ICD-10-CM | POA: Insufficient documentation

## 2012-02-10 DIAGNOSIS — I252 Old myocardial infarction: Secondary | ICD-10-CM | POA: Insufficient documentation

## 2012-02-10 DIAGNOSIS — I498 Other specified cardiac arrhythmias: Secondary | ICD-10-CM | POA: Insufficient documentation

## 2012-02-10 DIAGNOSIS — Z79899 Other long term (current) drug therapy: Secondary | ICD-10-CM | POA: Insufficient documentation

## 2012-02-10 DIAGNOSIS — R079 Chest pain, unspecified: Secondary | ICD-10-CM

## 2012-02-10 DIAGNOSIS — R0602 Shortness of breath: Secondary | ICD-10-CM | POA: Insufficient documentation

## 2012-02-10 DIAGNOSIS — I2510 Atherosclerotic heart disease of native coronary artery without angina pectoris: Secondary | ICD-10-CM | POA: Insufficient documentation

## 2012-02-10 DIAGNOSIS — E669 Obesity, unspecified: Secondary | ICD-10-CM | POA: Insufficient documentation

## 2012-02-10 DIAGNOSIS — E119 Type 2 diabetes mellitus without complications: Secondary | ICD-10-CM | POA: Insufficient documentation

## 2012-02-10 DIAGNOSIS — I200 Unstable angina: Secondary | ICD-10-CM

## 2012-02-10 DIAGNOSIS — R42 Dizziness and giddiness: Secondary | ICD-10-CM | POA: Insufficient documentation

## 2012-02-10 LAB — BASIC METABOLIC PANEL
BUN: 19 mg/dL (ref 6–23)
GFR calc Af Amer: 68 mL/min — ABNORMAL LOW (ref >90)
GFR calc non Af Amer: 58 mL/min — ABNORMAL LOW (ref >90)
Potassium: 5 mEq/L (ref 3.5–5.1)
Sodium: 137 mEq/L (ref 135–145)

## 2012-02-10 LAB — HEPARIN LEVEL (UNFRACTIONATED): Heparin Unfractionated: <0.1 IU/mL — ABNORMAL LOW (ref 0.30–0.70)

## 2012-02-10 LAB — CBC
HCT: 41.6 % (ref 36.0–46.0)
Hemoglobin: 13.9 g/dL (ref 12.0–15.0)
MCHC: 33.2 g/dL (ref 30.0–36.0)
RBC: 4.7 MIL/uL (ref 3.87–5.11)
RDW: 15.8 % — ABNORMAL HIGH (ref 11.5–15.5)

## 2012-02-10 LAB — PROTIME-INR: INR: 0.97 (ref 0.00–1.49)

## 2012-02-10 MED ORDER — NITROGLYCERIN 2 % TD OINT: 1.0000 [in_us] | TOPICAL_OINTMENT | Freq: Four times a day (QID) | TRANSDERMAL | Status: DC

## 2012-02-10 MED ORDER — NITROGLYCERIN 0.4 MG SL SUBL
0.4000 mg | SUBLINGUAL_TABLET | SUBLINGUAL | Status: DC | PRN
  Administered 2012-02-10 (×2): 0.4 mg via SUBLINGUAL
  Filled 2012-02-10: qty 25

## 2012-02-10 MED ORDER — NITROGLYCERIN 2 % TD OINT
1.0000 [in_us] | TOPICAL_OINTMENT | Freq: Once | TRANSDERMAL | Status: AC
  Administered 2012-02-10: 1 [in_us] via TOPICAL
  Filled 2012-02-10: qty 30

## 2012-02-10 MED ORDER — HEPARIN BOLUS VIA INFUSION: 3500.0000 [IU] | Freq: Once | INTRAVENOUS | Status: DC

## 2012-02-10 MED ORDER — HEPARIN (PORCINE) IN NACL 100-0.45 UNIT/ML-% IJ SOLN
800.0000 [IU]/h | INTRAMUSCULAR | Status: DC
  Filled 2012-02-10: qty 250

## 2012-02-10 MED ORDER — CLOPIDOGREL BISULFATE 300 MG PO TABS
300.0000 mg | ORAL_TABLET | Freq: Once | ORAL | Status: AC
  Administered 2012-02-10: 300 mg via ORAL
  Filled 2012-02-10: qty 1

## 2012-02-10 MED ORDER — ASPIRIN 81 MG PO CHEW
324.0000 mg | CHEWABLE_TABLET | Freq: Once | ORAL | Status: AC
  Administered 2012-02-10: 324 mg via ORAL
  Filled 2012-02-10: qty 4

## 2012-02-10 NOTE — Progress Notes (Signed)
ANTICOAGULATION CONSULT NOTE - Initial Consult  Pharmacy Consult for Heparin Indication: chest pain/ACS  Allergies  Allergen Reactions  . Prednisone Nausea And Vomiting    Patient Measurements: Height: 5\' 1"  (154.9 cm) Weight: 205 lb (92.987 kg) IBW/kg (Calculated) : 47.8  Heparin Dosing Weight: 66kg  Vital Signs: BP: 161/77 mmHg (07/10 2000) Pulse Rate: 65  (07/10 2055)  Labs:  Basename 02/10/12 1925  HGB 13.8  HCT 41.6  PLT 248  APTT --  LABPROT --  INR --  HEPARINUNFRC --  CREATININE 0.91  CKTOTAL --  CKMB --  TROPONINI <0.30    Estimated Creatinine Clearance: 52.2 ml/min (by C-G formula based on Cr of 0.91).   Medical History: Past Medical History  Diagnosis Date  . Diabetes mellitus   . Coronary artery disease   . MI (myocardial infarction)     Medications:  Scheduled:    . aspirin  324 mg Oral Once  . clopidogrel  300 mg Oral Once  . nitroGLYCERIN  1 inch Topical Once  . DISCONTD: nitroGLYCERIN  1 inch Topical Q6H    Assessment:  74 yof with DM, CAD s/p MI with stent, presents with chest pain, shortness of breath & nausea  Begin IV heparin for recurrent anginal synptoms  Concurrent plavix noted, patient reports taking aspirin prior to admission  Goal of Therapy:  Heparin level 0.3-0.7 units/ml Monitor platelets by anticoagulation protocol: Yes   Plan:   Heparin 3500 units IV bolus (~60 units/kg)  Heparin 800 units/hr infusion (~12 units/kg/hr)  Heparin level in 8 hrs  Daily heparin level & CBC   Peggyann Juba, PharmD, BCPS Pager: [phone] 02/10/2012,10:18 PM

## 2012-02-10 NOTE — ED Notes (Signed)
Witnessed conversation between pt and cardiologist. Cardiologist advised pt to stay, but pt refused. Pt states "I want my Oxycodone and my medicine. I want to wash my face, wash my feet. My back hurts because of this bed." Physician informed pt that she can be given her medicine and that we can wash her face and feet. Pt informed of risks if leaving, but pt still refused treatment and stated that she chose to go home.

## 2012-02-10 NOTE — ED Notes (Signed)
Pt states she took 325 mg of aspirin at 0800.

## 2012-02-10 NOTE — ED Notes (Signed)
[id number] Expected date:02/10/12<BR> Expected time: 7:07 PM<BR> Means of arrival:<BR> Comments:<BR> Active Chest Pain coming from 4th floor

## 2012-02-10 NOTE — Consult Note (Signed)
Referring Physician: Dirk Dress ED Primary Cardiologist: [facility] Reason for Consultation: Chest pain  HPI: 78 year old female with history of coronary artery disease status post drug-eluting stent, 2.7 x 28-mm Taxus to the mid LAD on August 21, 2003 with minor 30% RCA lesion, who presents to the ED after an episode of angina today.  Historically, she underwent cath in March 2012 which demonstrated a patent LAD stent with non-obstructive disease in her other vessels.  She had done well until today, when she developed angina when walking from the parking lot to the hospital (was going to visit a friend).  Her substernal chest pain was associated with shortness of breath and nausea.  It resolved after resting.  She came to the ED and was treated with ASA, plavix 300mg , and heparin bolus and cardiology was consulted.    The patient notes that she typically does not have exertional angina.  She does note that she is mildly short of breath when exerting herself, and this has gotten worse over the past few months.     Past Medical History  Diagnosis Date  . Diabetes mellitus   . Coronary artery disease   . MI (myocardial infarction)     History   Social History  . Marital Status: Divorced    Spouse Name: N/A    Number of Children: N/A  . Years of Education: N/A   Occupational History  . Not on file.   Social History Main Topics  . Smoking status: Former Research scientist (life sciences)  . Smokeless tobacco: Not on file  . Alcohol Use: Yes  . Drug Use:   . Sexually Active:    Other Topics Concern  . Not on file   Social History Narrative  . No narrative on file    FAMILY HISTORY: Father died of stroke at 77. Mother died of old age at  8, had bypass at age 109.  No current facility-administered medications on file prior to encounter.   Current Outpatient Prescriptions on File Prior to Encounter  Medication Sig Dispense Refill  . estrogen-methylTESTOSTERone 0.625-1.25 MG per tablet Take 1 tablet by  mouth daily.      Marland Kitchen gabapentin (NEURONTIN) 300 MG capsule Take 300 mg by mouth 2 (two) times daily.      Marland Kitchen levothyroxine (SYNTHROID, LEVOTHROID) 75 MCG tablet Take 75 mcg by mouth daily.      . metFORMIN (GLUCOPHAGE) 1000 MG tablet Take 1,000 mg by mouth 2 (two) times daily with a meal.      . zolpidem (AMBIEN) 10 MG tablet Take 10 mg by mouth at bedtime.         (Not in a hospital admission)  Allergies  Allergen Reactions  . Prednisone Nausea And Vomiting    Review of Systems: All systems reviewed and are negative except as mentioned above in the history of present illness.    PHYSICAL EXAM: Filed Vitals:   02/10/12 2222  BP: 153/30  Pulse: 64  Resp: 18   GENERAL: No acute distress.   HEENT: Normocephalic, atraumatic.  Oropharynx is pink and moist without lesions.  NECK: Supple, no LAD, no JVD, no masses. CV: Regular rate and rhythm with no murmurs, rubs, or gallops.   LUNGS: Clear to auscultation bilaterally.   ABDOMEN: +BS, soft, nontender, nondistended.  EXTREMITIES: No clubbing, cyanosis, or edema.   NEURO: AO x 3, no focal deficits. PYSCH: Normal affect. SKIN: No rashes.     ECG: from 720pm - sinus arrhythmia at 52 BPM with first degree  AV block , LVH with repol, no change from priors.   Results for orders placed during the hospital encounter of 02/10/12 (from the past 24 hour(s))  CBC     Status: Abnormal   Collection Time   02/10/12  7:25 PM      Component Value Range   WBC 9.9  4.0 - 10.5 K/uL   RBC 4.63  3.87 - 5.11 MIL/uL   Hemoglobin 13.8  12.0 - 15.0 g/dL   HCT 41.6  36.0 - 46.0 %   MCV 89.8  78.0 - 100.0 fL   MCH 29.8  26.0 - 34.0 pg   MCHC 33.2  30.0 - 36.0 g/dL   RDW 15.8 (*) 11.5 - 15.5 %   Platelets 248  150 - 400 K/uL  BASIC METABOLIC PANEL     Status: Abnormal   Collection Time   02/10/12  7:25 PM      Component Value Range   Sodium 137  135 - 145 mEq/L   Potassium 5.0  3.5 - 5.1 mEq/L   Chloride 105  96 - 112 mEq/L   CO2 20  19 - 32 mEq/L    Glucose, Bld 123 (*) 70 - 99 mg/dL   BUN 19  6 - 23 mg/dL   Creatinine, Ser 0.91  0.50 - 1.10 mg/dL   Calcium 11.5 (*) 8.4 - 10.5 mg/dL   GFR calc non Af Amer 58 (*) >90 mL/min   GFR calc Af Amer 68 (*) >90 mL/min  TROPONIN I     Status: Normal   Collection Time   02/10/12  7:25 PM      Component Value Range   Troponin I <0.30  <0.30 ng/mL  APTT     Status: Normal   Collection Time   02/10/12 10:34 PM      Component Value Range   aPTT 28  24 - 37 seconds  PROTIME-INR     Status: Normal   Collection Time   02/10/12 10:34 PM      Component Value Range   Prothrombin Time 13.1  11.6 - 15.2 seconds   INR 0.97  0.00 - 1.49  CBC     Status: Abnormal   Collection Time   02/10/12 10:34 PM      Component Value Range   WBC 8.9  4.0 - 10.5 K/uL   RBC 4.70  3.87 - 5.11 MIL/uL   Hemoglobin 13.9  12.0 - 15.0 g/dL   HCT 42.7  36.0 - 46.0 %   MCV 90.9  78.0 - 100.0 fL   MCH 29.6  26.0 - 34.0 pg   MCHC 32.6  30.0 - 36.0 g/dL   RDW 15.6 (*) 11.5 - 15.5 %   Platelets 247  150 - 400 K/uL   Dg Chest 2 View  02/10/2012  *RADIOLOGY REPORT*  Clinical Data: Chest pain  CHEST - 2 VIEW  Comparison: 10/26/2010  Findings: Lungs are markedly under aerated and grossly clear. Cardiac silhouette is prominent.  No pneumothorax and no pleural effusion.  IMPRESSION: Low lung volumes.  No active cardiopulmonary disease.  Original Report Authenticated By: Jamas Lav, M.D.    ASSESSMENT and PLAN: 78 year old white female with a past medical history significant for coronary artery disease status post stenting of the LAD in 2005 and recent cardiac catheterization in 2012 demonstrating no obstructive coronary artery disease, who presents to the emergency department for evaluation of chest pain. Her clinical syndrome is concerning for unstable angina. I am  concerned that her recent episodes of dyspnea on exertion may be an anginal equivalent. She does not typically have any angina, which is why I am concerned about  the episode today.  I asked the emergency department to give her a Plavix load since he takes Plavix chronically at home, in addition to aspirin, and a heparin drip. I explained to the patient about that she would benefit from inpatient admission and evaluation to rule out an acute coronary syndrome. Unfortunately, the patient does not want to be admitted to the hospital. She states, "she needs to go home and sleep in a more comfortable bed and wash her face and get her oxycodone."  I explained her that we would continue all of her home medications including pain medications, but she did not want to stay. She understands the consequences, which could include myocardial infarction and possibly even death. I have recommended that she sign out [place].  Delfin Gant, MD Level 5 Consult

## 2012-02-10 NOTE — ED Notes (Signed)
Pt returned from X-ray.  

## 2012-02-10 NOTE — ED Notes (Addendum)
Pt was walking around the parking lot at 1800 when she began having chest pain. Pt made it upstairs to visit her friend when one of the nurses was informed that she was having chest pain. Denies vomiting. Reports nausea. Pt was taken by wheelchair to [facility]. Pt reports taking one of her own nitro and states her pain went down to a 3 because of it and taking 325 mg of Aspirin today. Pt normally on Plavix, Oxycodone, Metformin, Testerone, Estrogen. Hx of MI and DM.

## 2012-02-10 NOTE — ED Provider Notes (Signed)
History     CSN: [id number]  Arrival date & time 02/10/12  1914   First MD Initiated Contact with Patient 02/10/12 2107      Chief Complaint  Patient presents with  . Chest Pain   HPI Pt had chest pain earlier today that she related to her angina.  She was walking up a hill and had pain in her chest and feel dizzy.  The pain was in the center and went into her neck.  She felt short of breath and had some nausea.  She took NTG and her symptoms resolved.  Pt denies any pain at this time. Pt has history of CAD and had a stent placed maybe 5-6 years ago.  Pt has not seen her cardiologist in a few years since having a few of these angina attacks over the years.  Past Medical History  Diagnosis Date  . Diabetes mellitus   . Coronary artery disease   . MI (myocardial infarction)     Past Surgical History  Procedure Date  . Cholecystectomy   . Back surgery   . Abdominal hysterectomy     No family history on file.  History  Substance Use Topics  . Smoking status: Former Research scientist (life sciences)  . Smokeless tobacco: Not on file  . Alcohol Use: Yes    OB History    Grav Para Term Preterm Abortions TAB SAB Ect Mult Living                  Review of Systems  All other systems reviewed and are negative.    Allergies  Prednisone  Home Medications   Current Outpatient Rx  Name Route Sig Dispense Refill  . ALPRAZOLAM 0.25 MG PO TABS Oral Take 0.25 mg by mouth 3 (three) times daily as needed. anxiety    . CLOPIDOGREL BISULFATE 75 MG PO TABS Oral Take 75 mg by mouth daily.    Marland Kitchen EST ESTROGENS-METHYLTEST 0.625-1.25 MG PO TABS Oral Take 1 tablet by mouth daily.    Marland Kitchen GABAPENTIN 300 MG PO CAPS Oral Take 300 mg by mouth 2 (two) times daily.    Marland Kitchen LEVOTHYROXINE SODIUM 75 MCG PO TABS Oral Take 75 mcg by mouth daily.    Marland Kitchen LIDOCAINE 5 % EX PTCH Transdermal Place 1 patch onto the skin daily. Remove & Discard patch within 12 hours or as directed by MD    . METFORMIN HCL 1000 MG PO TABS Oral Take 1,000  mg by mouth 2 (two) times daily with a meal.    . OXYCODONE HCL 5 MG PO CAPS Oral Take 5 mg by mouth every 6 (six) hours as needed. pain    . RAMIPRIL 5 MG PO TABS Oral Take 5 mg by mouth daily.    Marland Kitchen VALACYCLOVIR HCL 500 MG PO TABS Oral Take 500 mg by mouth 2 (two) times daily.    Marland Kitchen ZOLPIDEM TARTRATE 10 MG PO TABS Oral Take 10 mg by mouth at bedtime.      BP 161/77  Pulse 65  Resp 15  SpO2 100%  Physical Exam  Nursing note and vitals reviewed. Constitutional: She appears well-developed and well-nourished. No distress.       Obese  HENT:  Head: Normocephalic and atraumatic.  Right Ear: External ear normal.  Left Ear: External ear normal.  Eyes: Conjunctivae are normal. Right eye exhibits no discharge. Left eye exhibits no discharge. No scleral icterus.  Neck: Neck supple. No tracheal deviation present.  Cardiovascular: Regular rhythm and  intact distal pulses.  Bradycardia present.   Pulmonary/Chest: Effort normal and breath sounds normal. No stridor. No respiratory distress. She has no wheezes. She has no rales.  Abdominal: Soft. Bowel sounds are normal. She exhibits no distension. There is no tenderness. There is no rebound and no guarding.  Musculoskeletal: She exhibits no edema and no tenderness.  Neurological: She is alert. She has normal strength. No sensory deficit. Cranial nerve deficit:  no gross defecits noted. She exhibits normal muscle tone. She displays no seizure activity. Coordination normal.  Skin: Skin is warm and dry. No rash noted.  Psychiatric: She has a normal mood and affect.    ED Course  Procedures (including critical care time)  EKG Rate 52 SINUS RHYTHM ~ normal P axis, V-rate 50- 99 BORDERLINE AV CONDUCTION DELAY ~ PR >210, V-rate 50- 90 LVH WITH SECONDARY REPOLARIZATION ABNORMALITY ~ R56L/RISIII/S12R56/S3RL & rep abn No old ekg Labs Reviewed  CBC - Abnormal; Notable for the following:    RDW 15.8 (*)     All other components within normal limits    BASIC METABOLIC PANEL - Abnormal; Notable for the following:    Glucose, Bld 123 (*)     Calcium 11.5 (*)     GFR calc non Af Amer 58 (*)     GFR calc Af Amer 68 (*)     All other components within normal limits  TROPONIN I   Dg Chest 2 View  02/10/2012  *RADIOLOGY REPORT*  Clinical Data: Chest pain  CHEST - 2 VIEW  Comparison: 10/26/2010  Findings: Lungs are markedly under aerated and grossly clear. Cardiac silhouette is prominent.  No pneumothorax and no pleural effusion.  IMPRESSION: Low lung volumes.  No active cardiopulmonary disease.  Original Report Authenticated By: Jamas Lav, M.D.      MDM  Pt's symptoms are concerning for recurrent anginal symptoms.  She has known history of CAD with prior stents.  Pain free now.  Will consult her cardiologist for admission and further workup       Kathalene Frames, MD 02/10/12 2151

## 2012-02-12 ENCOUNTER — Observation Stay (HOSPITAL_COMMUNITY)
Admission: EM | Admit: 2012-02-12 | Discharge: 2012-02-15 | Disposition: A | Discharge status: Home / Self Care | Payer: Medicare Other | Attending: Internal Medicine | Admitting: Internal Medicine

## 2012-02-12 ENCOUNTER — Emergency Department (HOSPITAL_COMMUNITY): Payer: Medicare Other

## 2012-02-12 ENCOUNTER — Encounter (HOSPITAL_COMMUNITY): Payer: Self-pay | Admitting: Emergency Medicine

## 2012-02-12 DIAGNOSIS — G8929 Other chronic pain: Secondary | ICD-10-CM

## 2012-02-12 DIAGNOSIS — E1159 Type 2 diabetes mellitus with other circulatory complications: Secondary | ICD-10-CM

## 2012-02-12 DIAGNOSIS — R0989 Other specified symptoms and signs involving the circulatory and respiratory systems: Secondary | ICD-10-CM

## 2012-02-12 DIAGNOSIS — I509 Heart failure, unspecified: Secondary | ICD-10-CM | POA: Insufficient documentation

## 2012-02-12 DIAGNOSIS — E210 Primary hyperparathyroidism: Secondary | ICD-10-CM | POA: Insufficient documentation

## 2012-02-12 DIAGNOSIS — I2510 Atherosclerotic heart disease of native coronary artery without angina pectoris: Secondary | ICD-10-CM

## 2012-02-12 DIAGNOSIS — I110 Hypertensive heart disease with heart failure: Secondary | ICD-10-CM | POA: Insufficient documentation

## 2012-02-12 DIAGNOSIS — F319 Bipolar disorder, unspecified: Secondary | ICD-10-CM | POA: Insufficient documentation

## 2012-02-12 DIAGNOSIS — E039 Hypothyroidism, unspecified: Secondary | ICD-10-CM | POA: Insufficient documentation

## 2012-02-12 DIAGNOSIS — I119 Hypertensive heart disease without heart failure: Secondary | ICD-10-CM

## 2012-02-12 DIAGNOSIS — R079 Chest pain, unspecified: Secondary | ICD-10-CM

## 2012-02-12 DIAGNOSIS — E119 Type 2 diabetes mellitus without complications: Secondary | ICD-10-CM | POA: Insufficient documentation

## 2012-02-12 DIAGNOSIS — R0602 Shortness of breath: Secondary | ICD-10-CM

## 2012-02-12 DIAGNOSIS — R0609 Other forms of dyspnea: Secondary | ICD-10-CM

## 2012-02-12 DIAGNOSIS — E785 Hyperlipidemia, unspecified: Secondary | ICD-10-CM | POA: Insufficient documentation

## 2012-02-12 DIAGNOSIS — I252 Old myocardial infarction: Secondary | ICD-10-CM | POA: Insufficient documentation

## 2012-02-12 DIAGNOSIS — M419 Scoliosis, unspecified: Secondary | ICD-10-CM | POA: Insufficient documentation

## 2012-02-12 DIAGNOSIS — G894 Chronic pain syndrome: Secondary | ICD-10-CM

## 2012-02-12 DIAGNOSIS — E669 Obesity, unspecified: Secondary | ICD-10-CM

## 2012-02-12 DIAGNOSIS — I5031 Acute diastolic (congestive) heart failure: Principal | ICD-10-CM

## 2012-02-12 DIAGNOSIS — I798 Other disorders of arteries, arterioles and capillaries in diseases classified elsewhere: Secondary | ICD-10-CM

## 2012-02-12 DIAGNOSIS — M519 Unspecified thoracic, thoracolumbar and lumbosacral intervertebral disc disorder: Secondary | ICD-10-CM | POA: Insufficient documentation

## 2012-02-12 DIAGNOSIS — Z6837 Body mass index (BMI) 37.0-37.9, adult: Secondary | ICD-10-CM | POA: Insufficient documentation

## 2012-02-12 DIAGNOSIS — E1169 Type 2 diabetes mellitus with other specified complication: Secondary | ICD-10-CM | POA: Insufficient documentation

## 2012-02-12 HISTORY — DX: Atherosclerotic heart disease of native coronary artery without angina pectoris: I25.10

## 2012-02-12 HISTORY — DX: Hyperlipidemia, unspecified: E78.5

## 2012-02-12 HISTORY — DX: Hypothyroidism, unspecified: E03.9

## 2012-02-12 HISTORY — DX: Obesity, unspecified: E66.9

## 2012-02-12 HISTORY — DX: Chronic pain syndrome: G89.4

## 2012-02-12 HISTORY — DX: Unspecified thoracic, thoracolumbar and lumbosacral intervertebral disc disorder: M51.9

## 2012-02-12 HISTORY — DX: Scoliosis, unspecified: M41.9

## 2012-02-12 LAB — CARDIAC PANEL(CRET KIN+CKTOT+MB+TROPI)
Relative Index: INVALID (ref 0.0–2.5)
Total CK: 81 U/L (ref 7–177)

## 2012-02-12 LAB — CBC
HCT: 40.4 % (ref 36.0–46.0)
Hemoglobin: 13.5 g/dL (ref 12.0–15.0)
MCV: 89.2 fL (ref 78.0–100.0)
RBC: 4.53 MIL/uL (ref 3.87–5.11)
WBC: 8.2 10*3/uL (ref 4.0–10.5)

## 2012-02-12 LAB — COMPREHENSIVE METABOLIC PANEL
Alkaline Phosphatase: 114 U/L (ref 39–117)
BUN: 19 mg/dL (ref 6–23)
Calcium: 11.4 mg/dL — ABNORMAL HIGH (ref 8.4–10.5)
Creatinine, Ser: 1.03 mg/dL (ref 0.50–1.10)
GFR calc Af Amer: 58 mL/min — ABNORMAL LOW (ref >90)
Glucose, Bld: 108 mg/dL — ABNORMAL HIGH (ref 70–99)
Potassium: 4.5 mEq/L (ref 3.5–5.1)
Total Protein: 7.2 g/dL (ref 6.0–8.3)

## 2012-02-12 LAB — TROPONIN I: Troponin I: <0.3 ng/mL (ref <0.30)

## 2012-02-12 LAB — BASIC METABOLIC PANEL
BUN: 21 mg/dL (ref 6–23)
CO2: 23 mEq/L (ref 19–32)
Chloride: 104 mEq/L (ref 96–112)
Creatinine, Ser: 1.13 mg/dL — ABNORMAL HIGH (ref 0.50–1.10)

## 2012-02-12 LAB — POCT I-STAT TROPONIN I: Troponin i, poc: 0.09 ng/mL (ref 0.00–0.08)

## 2012-02-12 MED ORDER — LIDOCAINE 5 % EX PTCH
1.0000 | MEDICATED_PATCH | CUTANEOUS | Status: DC
  Administered 2012-02-13 – 2012-02-15 (×3): 1 via TRANSDERMAL
  Filled 2012-02-12 (×4): qty 1

## 2012-02-12 MED ORDER — ACETAMINOPHEN 650 MG RE SUPP: 650.0000 mg | Freq: Four times a day (QID) | RECTAL | Status: DC | PRN

## 2012-02-12 MED ORDER — SODIUM CHLORIDE 0.9 % IJ SOLN
3.0000 mL | Freq: Two times a day (BID) | INTRAMUSCULAR | Status: DC
  Administered 2012-02-13: 3 mL via INTRAVENOUS

## 2012-02-12 MED ORDER — ONDANSETRON HCL 4 MG/2ML IJ SOLN: 4.0000 mg | Freq: Four times a day (QID) | INTRAMUSCULAR | Status: DC | PRN

## 2012-02-12 MED ORDER — ENOXAPARIN SODIUM 40 MG/0.4ML ~~LOC~~ SOLN
40.0000 mg | Freq: Every day | SUBCUTANEOUS | Status: DC
  Administered 2012-02-13 – 2012-02-14 (×2): 40 mg via SUBCUTANEOUS
  Filled 2012-02-12 (×3): qty 0.4

## 2012-02-12 MED ORDER — ZOLPIDEM TARTRATE 5 MG PO TABS: 10.0000 mg | ORAL_TABLET | Freq: Every day | ORAL | Status: DC

## 2012-02-12 MED ORDER — VALACYCLOVIR HCL 500 MG PO TABS
500.0000 mg | ORAL_TABLET | Freq: Two times a day (BID) | ORAL | Status: DC
  Administered 2012-02-13 – 2012-02-14 (×5): 500 mg via ORAL
  Filled 2012-02-12 (×7): qty 1

## 2012-02-12 MED ORDER — SODIUM CHLORIDE 0.9 % IJ SOLN
3.0000 mL | Freq: Two times a day (BID) | INTRAMUSCULAR | Status: DC
  Administered 2012-02-13 – 2012-02-14 (×5): 3 mL via INTRAVENOUS

## 2012-02-12 MED ORDER — FUROSEMIDE 10 MG/ML IJ SOLN
40.0000 mg | Freq: Once | INTRAMUSCULAR | Status: AC
  Administered 2012-02-12: 40 mg via INTRAVENOUS
  Filled 2012-02-12: qty 4

## 2012-02-12 MED ORDER — CLINDAMYCIN PHOSPHATE 1 % EX LOTN
1.0000 "application " | TOPICAL_LOTION | Freq: Two times a day (BID) | CUTANEOUS | Status: DC
  Administered 2012-02-13 – 2012-02-14 (×3): 1 via TOPICAL
  Administered 2012-02-14: 11:00:00 via TOPICAL
  Administered 2012-02-14: 1 via TOPICAL

## 2012-02-12 MED ORDER — ALUM & MAG HYDROXIDE-SIMETH 200-200-20 MG/5ML PO SUSP: 30.0000 mL | Freq: Four times a day (QID) | ORAL | Status: DC | PRN

## 2012-02-12 MED ORDER — FUROSEMIDE 10 MG/ML IJ SOLN
40.0000 mg | Freq: Two times a day (BID) | INTRAMUSCULAR | Status: DC
  Administered 2012-02-13 – 2012-02-15 (×5): 40 mg via INTRAVENOUS
  Filled 2012-02-12 (×8): qty 4

## 2012-02-12 MED ORDER — SODIUM CHLORIDE 0.9 % IJ SOLN: 3.0000 mL | INTRAMUSCULAR | Status: DC | PRN

## 2012-02-12 MED ORDER — OXYCODONE-ACETAMINOPHEN 5-325 MG PO TABS
2.0000 | ORAL_TABLET | Freq: Once | ORAL | Status: AC
  Administered 2012-02-12: 1 via ORAL
  Filled 2012-02-12: qty 2

## 2012-02-12 MED ORDER — SODIUM CHLORIDE 0.9 % IV SOLN: 250.0000 mL | INTRAVENOUS | Status: DC | PRN

## 2012-02-12 MED ORDER — FUROSEMIDE 10 MG/ML IJ SOLN
40.0000 mg | Freq: Once | INTRAMUSCULAR | Status: DC
  Filled 2012-02-12: qty 4

## 2012-02-12 MED ORDER — GABAPENTIN 300 MG PO CAPS
300.0000 mg | ORAL_CAPSULE | Freq: Two times a day (BID) | ORAL | Status: DC
  Administered 2012-02-13 – 2012-02-14 (×5): 300 mg via ORAL
  Filled 2012-02-12 (×7): qty 1

## 2012-02-12 MED ORDER — LEVOTHYROXINE SODIUM 75 MCG PO TABS
75.0000 ug | ORAL_TABLET | Freq: Every day | ORAL | Status: DC
  Administered 2012-02-13 – 2012-02-15 (×3): 75 ug via ORAL
  Filled 2012-02-12 (×4): qty 1

## 2012-02-12 MED ORDER — OXYCODONE HCL 5 MG PO CAPS: 5.0000 mg | ORAL_CAPSULE | Freq: Four times a day (QID) | ORAL | Status: DC | PRN

## 2012-02-12 MED ORDER — FUROSEMIDE 10 MG/ML IJ SOLN: 40.0000 mg | Freq: Two times a day (BID) | INTRAMUSCULAR | Status: DC

## 2012-02-12 MED ORDER — ALPRAZOLAM 0.25 MG PO TABS: 0.2500 mg | ORAL_TABLET | Freq: Three times a day (TID) | ORAL | Status: DC | PRN

## 2012-02-12 MED ORDER — ACETAMINOPHEN 325 MG PO TABS
650.0000 mg | ORAL_TABLET | Freq: Four times a day (QID) | ORAL | Status: DC | PRN
  Administered 2012-02-14: 650 mg via ORAL

## 2012-02-12 MED ORDER — RAMIPRIL 5 MG PO CAPS
5.0000 mg | ORAL_CAPSULE | Freq: Every day | ORAL | Status: DC
  Administered 2012-02-13 – 2012-02-14 (×2): 5 mg via ORAL
  Filled 2012-02-12 (×3): qty 1

## 2012-02-12 MED ORDER — PANTOPRAZOLE SODIUM 40 MG PO TBEC
40.0000 mg | DELAYED_RELEASE_TABLET | Freq: Every day | ORAL | Status: DC
  Administered 2012-02-13 – 2012-02-14 (×2): 40 mg via ORAL
  Filled 2012-02-12: qty 1

## 2012-02-12 MED ORDER — ONDANSETRON HCL 4 MG PO TABS: 4.0000 mg | ORAL_TABLET | Freq: Four times a day (QID) | ORAL | Status: DC | PRN

## 2012-02-12 MED ORDER — OXYCODONE HCL 5 MG PO TABS
5.0000 mg | ORAL_TABLET | Freq: Four times a day (QID) | ORAL | Status: DC | PRN
  Administered 2012-02-13 – 2012-02-15 (×8): 5 mg via ORAL
  Filled 2012-02-12 (×8): qty 1

## 2012-02-12 MED ORDER — CLOPIDOGREL BISULFATE 75 MG PO TABS
75.0000 mg | ORAL_TABLET | Freq: Every day | ORAL | Status: DC
  Administered 2012-02-13 – 2012-02-15 (×3): 75 mg via ORAL
  Filled 2012-02-12 (×4): qty 1

## 2012-02-12 MED ORDER — RAMIPRIL 5 MG PO TABS: 5.0000 mg | ORAL_TABLET | Freq: Every day | ORAL | Status: DC

## 2012-02-12 MED ORDER — ASPIRIN 325 MG PO TABS
325.0000 mg | ORAL_TABLET | Freq: Every day | ORAL | Status: DC
  Administered 2012-02-13: 325 mg via ORAL
  Filled 2012-02-12: qty 1

## 2012-02-12 NOTE — ED Notes (Signed)
Patient currently resting quietly in bed; no respiratory or acute distress noted.  Patient updated on plan of care; informed patient that room has been assigned and report is being called.  Will continue to monitor.

## 2012-02-12 NOTE — H&P (Signed)
History and Physical  Kristine Oliver [id number] DOB: 1931/04/01 DOA: 02/12/2012  Referring physician: Lajean Saver, MD PCP: Irven Shelling, MD  Primary Cardiologist: [facility]  Chief Complaint: Shortness of breath  HPI:  78 year old woman presents with several day history of worsening dyspnea on exertion and shortness of breath. She was seen in the [facility] emergency department for the same as well as an episode of chest pain 2 days ago. There was concern for unstable angina (dyspnea on exertion as anginal equivalent) at that time and admission was recommended, however the patient left AMA.  She presented again today with complaint of increased shortness of breath and dyspnea on exertion however no frank chest pain since 2 days ago. She has had some neck tightness but no nausea or vomiting and no apparent left arm.  In the emergency department she was noted be afebrile with stable vital signs. Chemistry panel and CBC were unremarkable. Serum troponin was negative. BNP 1227. Because of her history and presenting symptoms she was referred for admission. Emergency department physician has consulted cardiology already.  Review of Systems:  Negative for fever, changes to her vision, sore throat, rash, dysuria, bleeding, nausea, vomiting.  Positive for chronic pain.  Past Medical History  Diagnosis Date  . Coronary artery disease   . MI (myocardial infarction)   . CAD (coronary artery disease) 02/12/2012    2.7 x 28 mm Taxus stent to LAD 2006 for nonstemi Cath 3/12 Dr. Marlou Porch  Normal left main, patent LAD stents with jailed septal, 30% circumflex, 30% RCA    . Type 2 diabetes mellitus with vascular disease   . Hypertensive heart disease without CHF   . Obesity (BMI 30-39.9)   . Chronic pain disorder   . Hypothyroidism   . Hyperlipidemia   . Lumbar disc disease   . Bipolar affective disorder   . Scoliosis    Past Surgical History  Procedure Date  . Cholecystectomy   . Lumbar  laminectomy   . Abdominal hysterectomy   . Dilation and curettage of uterus   . Pilonidal cyst excision   . Rotator cuff repair    Social History:  reports that she has quit smoking. She does not have any smokeless tobacco history on file. She reports that she drinks alcohol. Her drug history not on file.  Allergies  Allergen Reactions  . Prednisone Nausea And Vomiting   Family History  Problem Relation Age of Onset  . Coronary artery disease Mother   . Heart failure Father   . Diabetes Father    Prior to Admission medications   Medication Sig Start Date End Date Taking? Authorizing Provider  ALPRAZolam (XANAX) 0.25 MG tablet Take 0.25 mg by mouth 3 (three) times daily as needed. anxiety   Yes Historical Provider, MD  aspirin 325 MG tablet Take 325 mg by mouth daily.   Yes Historical Provider, MD  clindamycin (CLEOCIN T) 1 % lotion Apply 1 application topically 2 (two) times daily. For face   Yes Historical Provider, MD  clopidogrel (PLAVIX) 75 MG tablet Take 75 mg by mouth daily.   Yes Historical Provider, MD  estrogen-methylTESTOSTERone 0.625-1.25 MG per tablet Take 1 tablet by mouth daily.   Yes Historical Provider, MD  gabapentin (NEURONTIN) 300 MG capsule Take 300 mg by mouth 2 (two) times daily.   Yes Historical Provider, MD  levothyroxine (SYNTHROID, LEVOTHROID) 75 MCG tablet Take 75 mcg by mouth daily.   Yes Historical Provider, MD  lidocaine (LIDODERM) 5 % Place  1 patch onto the skin daily. Remove & Discard patch within 12 hours or as directed by MD   Yes Historical Provider, MD  metFORMIN (GLUCOPHAGE) 1000 MG tablet Take 1,000 mg by mouth 2 (two) times daily with a meal.   Yes Historical Provider, MD  omeprazole (PRILOSEC) 20 MG capsule Take 20 mg by mouth daily.   Yes Historical Provider, MD  oxycodone (OXY-IR) 5 MG capsule Take 5 mg by mouth every 6 (six) hours as needed. pain   Yes Historical Provider, MD  ramipril (ALTACE) 5 MG tablet Take 5 mg by mouth daily.   Yes  Historical Provider, MD  valACYclovir (VALTREX) 500 MG tablet Take 500 mg by mouth 2 (two) times daily.   Yes Historical Provider, MD  zolpidem (AMBIEN) 10 MG tablet Take 10 mg by mouth at bedtime.   Yes Historical Provider, MD   Physical Exam: Filed Vitals:   02/12/12 1402 02/12/12 1645 02/12/12 1711 02/12/12 1730  BP: 138/65 172/99  164/66  Pulse: 58 54 54 56  Temp: 98.6 F (37 C)     TempSrc: Oral     Resp: 16 19 17 18   SpO2: 97% 100% 99% 91%    General:  Appears calm and comfortable. Exam and in the emergency department.  Eyes: Pupils equal, round, react to light. Normal lids, irises.  ENT: Grossly normal hearing. Lips and tongue appear unremarkable.  Neck: No lymphadenopathy or masses. No thyromegaly.  Cardiovascular: Regular rate and rhythm. No murmur, rub, gallop. No lower extremity edema.  Respiratory: Clear to auscultation bilaterally. No wheezes, rales, rhonchi. Normal respiratory effort.  Abdomen: Soft, nontender, nondistended.  Skin: Appears grossly unremarkable.  Musculoskeletal: Grossly normal tone.  Psychiatric: Grossly normal mood and affect. Speech fluent and appropriate.  Neurologic: Grossly nonfocal.  Labs on Admission:  Basic Metabolic Panel:  Lab [id number] 02/10/12 1925  NA 137 137  K 4.5 5.0  CL 104 105  CO2 23 20  GLUCOSE 131* 123*  BUN 21 19  CREATININE 1.13* 0.91  CALCIUM 11.5* 11.5*  MG -- --  PHOS -- --   CBC:  Lab 02/12/12 1411 02/10/12 2234 02/10/12 1925  WBC 8.2 8.9 9.9  NEUTROABS -- -- --  HGB 13.5 13.9 13.8  HCT 40.4 42.7 41.6  MCV 89.2 90.9 89.8  PLT 231 247 248   Cardiac Enzymes:  Lab 02/12/12 1654 02/10/12 1925  CKTOTAL -- --  CKMB -- --  CKMBINDEX -- --  TROPONINI <0.30 <0.30   Troponin (Point of Care Test)  Basename 02/12/12 1456  TROPIPOC 0.09*     Basename 02/12/12 1411  PROBNP 1227.0*   Radiological Exams on Admission: Dg Chest 2 View  02/12/2012  *RADIOLOGY REPORT*  Clinical Data: Chest  pain, shortness of breath  CHEST - 2 VIEW  Comparison: 02/10/2012; 10/26/2010; 02/18/2008  Findings: Grossly unchanged cardiac silhouette and mediastinal contours given improved inspiratory effort.  There is persistent mild eventration of the right hemidiaphragm.  There is chronic mild diffuse thickening of the pulmonary interstitium.  Mild pulmonary venous congestion without frank evidence of pulmonary edema.  There is mild thickening along the right minor fissure.  No focal airspace opacities.  No pleural effusion or pneumothorax. Redemonstrated mildly accentuated thoracic kyphosis.  Post cholecystectomy.  IMPRESSION: Mild pulmonary venous congestion without frank evidence of pulmonary edema.  Original Report Authenticated By: Rachel Moulds, M.D.   Dg Chest 2 View  02/10/2012  *RADIOLOGY REPORT*  Clinical Data: Chest pain  CHEST - 2  VIEW  Comparison: 10/26/2010  Findings: Lungs are markedly under aerated and grossly clear. Cardiac silhouette is prominent.  No pneumothorax and no pleural effusion.  IMPRESSION: Low lung volumes.  No active cardiopulmonary disease.  Original Report Authenticated By: Jamas Lav, M.D.   EKG: Independently reviewed. Sinus rhythm. No acute changes.  Principal Problem:  *DOE (dyspnea on exertion) Active Problems:  CAD (coronary artery disease)  Type 2 diabetes mellitus with vascular disease  Obesity (BMI 30-39.9)  Hypertensive heart disease without CHF  SOB (shortness of breath)  Assessment/Plan 1. Dyspnea on exertion/shortness of breath: Given recent history of chest pain and concern for anginal equivalent cardiology was consulted in the emergency department. Discussed with Dr. Wynonia Lawman (consulted by EDP). Recommends observation overnight. Serial cardiac enzymes, 2-D echocardiogram, Lasix. 2. Recent chest pain: Plan as above. 3. Hypertensive heart disease: Continue outpatient antihypertensive. 4. Known coronary artery disease 5. Diabetes mellitus type 2: Appears  stable. Sliding-scale insulin. Hold metformin while in inpatient. 6. Obesity 7. Chronic pain: Appears stable. Continue oxycodone.  Message left for Dr. Laurann Montana.  Code Status: DO NOT RESUSCITATE Family Communication: None at bedside Disposition Plan: Pending further evaluation.  Murray Hodgkins, MD  [facility] Pager [phone] If 8PM-8AM, please contact floor/night-coverage at [email or web address], password [facility] 02/12/2012, 6:10 PM

## 2012-02-12 NOTE — ED Notes (Signed)
Patient currently resting quietly in bed; no respiratory or acute distress noted.  Patient updated on plan of care; informed patient that we are currently waiting on a bed request to be put in.  Patient has no other questions or concerns at this time; will continue to monitor.

## 2012-02-12 NOTE — ED Notes (Signed)
Patient currently resting quietly in bed; no respiratory or acute distress noted.  Patient updated on plan of care; informed patient that EDP has made a consult to hospitalist.  Patient has no other questions or concerns at this time; will continue to monitor.

## 2012-02-12 NOTE — ED Notes (Signed)
EKG done in triage

## 2012-02-12 NOTE — Consult Note (Signed)
Cardiology Consult Note  Admit date: 02/12/2012 Name: Kristine Oliver 78 y.o.  female DOB:  [birth year] MRN:  [id number]  Today's date:  02/12/2012  Referring Physician:    [facility]  Primary Physician:    Dr. Lavone Orn Reason for Consultation:    Dyspnea and recent chest discomfort  IMPRESSIONS:  1. Presentation of dyspnea and elevated BNP consistent with acute diastolic congestive heart failure 2. Coronary artery disease with previous drug-eluting stent to the LAD was patent at catheterization 16 months ago 3. Hypercalcemia will need to be repeated 4. Recent episode of chest discomfort consistent with angina 5. Obesity 6. Diabetes mellitus with vascular complications 7. Hypertensive heart disease 8. Hyperlipidemia under treatment 9. Chronic pain syndrome with recent running out of prescription for oxycodone  RECOMMENDATION:  Her history is somewhat nebulous. She did have an episode of angina several days ago and signed out against advice from the emergency room. She has mild elevation of her BNP level at this time that may represent acute diastolic dysfunction. Her troponin is negative I don't think this represents an acute coronary syndrome. The chronic pain syndrome complicates evaluation.  1. Obtain additional serial cardiac enzymes 2. Obtain echocardiogram 3. Administer furosemide and monitor blood pressure 4. Obtain repeat calcium and full compresses metabolic panel  HISTORY: This 78 year old female has a history of coronary artery disease with a previous drug-eluting stent to the LAD in 2006. He was patent at catheterization in March year ago but she has not had significant cardiac followup since then. She presented to the emergency room at [facility] with a prolonged episode of angina that occurred while she was walking in the hot air and humidity and was associated with dyspnea. She signed out against advice. She has noted progressive dyspnea over  the past several months with exertion and had more dyspnea and called her family physician this morning who advised her to come to the emergency room. She is not currently having chest discomfort. She does not have edema and does not have PND or orthopnea. She does not have significant palpitations. She has run out of her oxycodone and her last dose was yesterday and she is complaining currently of significant low back pain as well as sciatica.    Past Medical History  Diagnosis Date  . MI (myocardial infarction)   . CAD (coronary artery disease) 02/12/2012    2.7 x 28 mm Taxus stent to LAD 2006 for nonstemi Cath 3/12 Dr. Marlou Porch  Normal left main, patent LAD stents with jailed septal, 30% circumflex, 30% RCA    . Type 2 diabetes mellitus with vascular disease   . Hypertensive heart disease without CHF   . Obesity (BMI 30-39.9)   . Chronic pain disorder   . Hypothyroidism   . Hyperlipidemia   . Lumbar disc disease   . Bipolar affective disorder   . Scoliosis      Past Surgical History  Procedure Date  . Cholecystectomy   . Lumbar laminectomy   . Abdominal hysterectomy   . Dilation and curettage of uterus   . Pilonidal cyst excision   . Rotator cuff repair     Allergies:  is allergic to prednisone.   Medications: Prior to Admission medications   Medication Sig Start Date End Date Taking? Authorizing Provider  ALPRAZolam (XANAX) 0.25 MG tablet Take 0.25 mg by mouth 3 (three) times daily as needed. anxiety   Yes Historical Provider, MD  aspirin 325 MG tablet Take 325  mg by mouth daily.   Yes Historical Provider, MD  clindamycin (CLEOCIN T) 1 % lotion Apply 1 application topically 2 (two) times daily. For face   Yes Historical Provider, MD  clopidogrel (PLAVIX) 75 MG tablet Take 75 mg by mouth daily.   Yes Historical Provider, MD  estrogen-methylTESTOSTERone 0.625-1.25 MG per tablet Take 1 tablet by mouth daily.   Yes Historical Provider, MD  gabapentin (NEURONTIN) 300 MG capsule Take  300 mg by mouth 2 (two) times daily.   Yes Historical Provider, MD  levothyroxine (SYNTHROID, LEVOTHROID) 75 MCG tablet Take 75 mcg by mouth daily.   Yes Historical Provider, MD  lidocaine (LIDODERM) 5 % Place 1 patch onto the skin daily. Remove & Discard patch within 12 hours or as directed by MD   Yes Historical Provider, MD  metFORMIN (GLUCOPHAGE) 1000 MG tablet Take 1,000 mg by mouth 2 (two) times daily with a meal.   Yes Historical Provider, MD  omeprazole (PRILOSEC) 20 MG capsule Take 20 mg by mouth daily.   Yes Historical Provider, MD  oxycodone (OXY-IR) 5 MG capsule Take 5 mg by mouth every 6 (six) hours as needed. pain   Yes Historical Provider, MD  ramipril (ALTACE) 5 MG tablet Take 5 mg by mouth daily.   Yes Historical Provider, MD  valACYclovir (VALTREX) 500 MG tablet Take 500 mg by mouth 2 (two) times daily.   Yes Historical Provider, MD  zolpidem (AMBIEN) 10 MG tablet Take 10 mg by mouth at bedtime.   Yes Historical Provider, MD    Family History: Family Status  Relation Status Death Age  . Father Deceased 66    died of stroke  . Mother Deceased 38    died of old age, had CABG at age 39  . Sister Alive     Social History:   reports that she has quit smoking. She does not have any smokeless tobacco history on file. She reports that she drinks alcohol.   History   Social History Narrative   Divorced.    Review of Systems: She has been morbidly obese for years. She has significant anxiety as well as bipolar disorder in the past but does not have ongoing psychiatric care. She complains of having irritable bowel syndrome and complains of diarrhea as well as some constipation. She complains of urinary hesitancy. She has chronic neuropathic pain involving her feet and also significant chronic low back pain as well as sciatica. She wears glasses.  Other than as noted above the remainder of the review of systems is unremarkable.  Physical Exam: Blood pressure 142/69, pulse  57, temperature 98.1 F (36.7 C), temperature source Oral, resp. rate 17, SpO2 100.00%.    General appearance: Significantly obese female who is talkative, alert, cooperative, no distress, morbidly obese and Sitting up at side of the bed in no acute distress eating dinner Head: Normocephalic, without obvious abnormality, atraumatic Eyes: conjunctivae/corneas clear. PERRL, EOM's intact. Fundi benign. Neck: no adenopathy, no carotid bruit, no JVD and supple, symmetrical, trachea midline Lungs: clear to auscultation bilaterally Heart: regular rate and rhythm, S1, S2 normal, no murmur, click, rub or gallop Abdomen: soft, non-tender; bowel sounds normal; no masses,  no organomegaly Pelvic: deferred Extremities: extremities normal, atraumatic, no cyanosis or edema Pulses: 2+ and symmetric Skin: Multiple sebaceous keratoses noted Neurologic: Grossly normal   Labs: CBC    Component Value Date/Time   WBC 8.2 02/12/2012 1411   RBC 4.53 02/12/2012 1411   HGB 13.5 02/12/2012 1411  HCT 40.4 02/12/2012 1411   PLT 231 02/12/2012 1411   MCV 89.2 02/12/2012 1411   MCH 29.8 02/12/2012 1411   MCHC 33.4 02/12/2012 1411   RDW 15.5 02/12/2012 1411   LYMPHSABS 2.6 10/26/2010 0951   MONOABS 0.6 10/26/2010 0951   EOSABS 0.3 10/26/2010 0951   BASOSABS 0.0 10/26/2010 0951   CMP     Component Value Date/Time   NA 137 02/12/2012 1411   K 4.5 02/12/2012 1411   CL 104 02/12/2012 1411   CO2 23 02/12/2012 1411   GLUCOSE 131* 02/12/2012 1411   BUN 21 02/12/2012 1411   CREATININE 1.13* 02/12/2012 1411   CALCIUM 11.5* 02/12/2012 1411   PROT 5.9* 10/26/2010 0951   ALBUMIN 3.3* 10/26/2010 0951   AST 23 10/26/2010 0951   ALT 19 10/26/2010 0951   ALKPHOS 80 10/26/2010 0951   BILITOT 0.3 10/26/2010 0951   GFRNONAA 45* 02/12/2012 1411   GFRAA 52* 02/12/2012 1411   BNP (last 3 results)  Basename 02/12/12 1411  PROBNP 1227.0*   Cardiac Panel (last 3 results)  Basename 02/12/12 1654 02/10/12 1925  CKTOTAL -- --  CKMB -- --    TROPONINI <0.30 <0.30  RELINDX -- --     Radiology: Kyphosis, mild pulmonary venous congestion  EKG: Voltage for LVH with T wave inversions in 1 and aVL, normal sinus rhythm  Signed:  W. Doristine Church MD [facility]   Cardiology Consultant  02/12/2012, 7:12 PM

## 2012-02-12 NOTE — ED Notes (Signed)
Admitting MD at bedside; Dr. Ashok Cordia states that it is okay for patient to eat; heart healthy dinner tray ordered for patient.

## 2012-02-12 NOTE — ED Notes (Addendum)
Patient complaining of shortness of breath and fatigue; patient states that she had an "angina attack" on Wednesday after having to exert herself from parking far away in the parking lot -- was diagnosed at [facility]; given nitroglycerin tablets.  Patient left AMA because she did not receive any Percocet while there.  Patient states that she is out of her pain medications at home (refill on Monday); patient states that her diabetic neuropathy is causing pain all over her body. Patient denies chest pain at this time. Patient alert and oriented x4; PERRL present.  Upon arrival to room, patient was changed into gown and connected to continuous cardiac, pulse ox, and blood pressure monitor.  Will continue to monitor.

## 2012-02-12 NOTE — ED Notes (Signed)
Patient stated that she was seen at [facility] for angina but left AMA because she did not have any Oxycodone for her chronic pain syndrome. Patient is denying any chest pain at present but is requesting her Oxycodone 10 mg for pain. Pt is A/A/Ox4, skin is warm and dry, respiration is even and unlabored.

## 2012-02-12 NOTE — ED Provider Notes (Signed)
History     CSN: [id number]  Arrival date & time 02/12/12  1259   First MD Initiated Contact with Patient 02/12/12 1638      Chief Complaint  Patient presents with  . Chest Pain  . Shortness of Breath    (Consider location/radiation/quality/duration/timing/severity/associated sxs/prior treatment) Patient is a 78 y.o. female presenting with chest pain and shortness of breath. The history is provided by the patient.  Chest Pain Primary symptoms include shortness of breath. Pertinent negatives for primary symptoms include no fever, no cough, no abdominal pain and no vomiting.    Shortness of Breath  Associated symptoms include chest pain and shortness of breath. Pertinent negatives include no fever and no cough.  pt states hx cad, stent. Was walking 2 days ago and had mid to upper chest pain/tightness radiating to neck area, felt c/w prior cardiac cp. Lasted approximately 1 hour. Also notes sob. No nv or diaphoresis. Went to [facility] ED but left ama. Returns today stating has felt mildly sob since Wednesday. No acute or abrupt change in sob today. States is on oxycodone for chronic back pain and ran out last pm, states needs to have oxycodone for pain. No acute or abrupt change in or worsening of back pain. No leg numbness/weakness. Pt denies any chest pain or discomfort since 2 days ago when in ed. No leg pain or swelling. Denies recent change in meds.   Past Medical History  Diagnosis Date  . Diabetes mellitus   . Coronary artery disease   . MI (myocardial infarction)   . Chronic pain     Past Surgical History  Procedure Date  . Cholecystectomy   . Back surgery   . Abdominal hysterectomy     History reviewed. No pertinent family history.  History  Substance Use Topics  . Smoking status: Former Research scientist (life sciences)  . Smokeless tobacco: Not on file  . Alcohol Use: Yes    OB History    Grav Para Term Preterm Abortions TAB SAB Ect Mult Living                  Review of Systems    Constitutional: Negative for fever and chills.  HENT: Negative for neck pain.   Eyes: Negative for visual disturbance.  Respiratory: Positive for shortness of breath. Negative for cough.   Cardiovascular: Positive for chest pain. Negative for leg swelling.  Gastrointestinal: Negative for vomiting and abdominal pain.  Genitourinary: Negative for flank pain.  Musculoskeletal: Negative for back pain.  Skin: Negative for rash.  Neurological: Negative for headaches.  Hematological: Does not bruise/bleed easily.  Psychiatric/Behavioral: Negative for confusion.    Allergies  Prednisone  Home Medications   Current Outpatient Rx  Name Route Sig Dispense Refill  . ALPRAZOLAM 0.25 MG PO TABS Oral Take 0.25 mg by mouth 3 (three) times daily as needed. anxiety    . ASPIRIN 325 MG PO TABS Oral Take 325 mg by mouth daily.    Marland Kitchen CLINDAMYCIN PHOSPHATE 1 % EX LOTN Topical Apply 1 application topically 2 (two) times daily. For face    . CLOPIDOGREL BISULFATE 75 MG PO TABS Oral Take 75 mg by mouth daily.    Marland Kitchen EST ESTROGENS-METHYLTEST 0.625-1.25 MG PO TABS Oral Take 1 tablet by mouth daily.    Marland Kitchen GABAPENTIN 300 MG PO CAPS Oral Take 300 mg by mouth 2 (two) times daily.    Marland Kitchen LEVOTHYROXINE SODIUM 75 MCG PO TABS Oral Take 75 mcg by mouth daily.    Marland Kitchen  LIDOCAINE 5 % EX PTCH Transdermal Place 1 patch onto the skin daily. Remove & Discard patch within 12 hours or as directed by MD    . METFORMIN HCL 1000 MG PO TABS Oral Take 1,000 mg by mouth 2 (two) times daily with a meal.    . OMEPRAZOLE 20 MG PO CPDR Oral Take 20 mg by mouth daily.    . OXYCODONE HCL 5 MG PO CAPS Oral Take 5 mg by mouth every 6 (six) hours as needed. pain    . RAMIPRIL 5 MG PO TABS Oral Take 5 mg by mouth daily.    Marland Kitchen VALACYCLOVIR HCL 500 MG PO TABS Oral Take 500 mg by mouth 2 (two) times daily.    Marland Kitchen ZOLPIDEM TARTRATE 10 MG PO TABS Oral Take 10 mg by mouth at bedtime.      BP 172/99  Pulse 54  Temp 98.6 F (37 C) (Oral)  Resp 19  SpO2  100%  Physical Exam  Nursing note and vitals reviewed. Constitutional: She is oriented to person, place, and time. She appears well-developed and well-nourished. No distress.  HENT:  Nose: Nose normal.  Mouth/Throat: Oropharynx is clear and moist.  Eyes: Conjunctivae are normal. No scleral icterus.  Neck: Neck supple. No tracheal deviation present.  Cardiovascular: Normal rate, regular rhythm, normal heart sounds and intact distal pulses.  Exam reveals no gallop and no friction rub.   No murmur heard. Pulmonary/Chest: Effort normal and breath sounds normal. No respiratory distress. She exhibits no tenderness.  Abdominal: Soft. Normal appearance and bowel sounds are normal. She exhibits no distension. There is no tenderness.  Musculoskeletal: She exhibits no edema and no tenderness.  Neurological: She is alert and oriented to person, place, and time.  Skin: Skin is warm and dry. No rash noted.  Psychiatric: She has a normal mood and affect.    ED Course  Procedures (including critical care time)  Labs Reviewed  BASIC METABOLIC PANEL - Abnormal; Notable for the following:    Glucose, Bld 131 (*)     Creatinine, Ser 1.13 (*)     Calcium 11.5 (*)     GFR calc non Af Amer 45 (*)     GFR calc Af Amer 52 (*)     All other components within normal limits  PRO B NATRIURETIC PEPTIDE - Abnormal; Notable for the following:    Pro B Natriuretic peptide (BNP) 1227.0 (*)     All other components within normal limits  POCT I-STAT TROPONIN I - Abnormal; Notable for the following:    Troponin i, poc 0.09 (*)     All other components within normal limits  CBC  PROTIME-INR  TROPONIN I   Dg Chest 2 View  02/12/2012  *RADIOLOGY REPORT*  Clinical Data: Chest pain, shortness of breath  CHEST - 2 VIEW  Comparison: 02/10/2012; 10/26/2010; 02/18/2008  Findings: Grossly unchanged cardiac silhouette and mediastinal contours given improved inspiratory effort.  There is persistent mild eventration of the  right hemidiaphragm.  There is chronic mild diffuse thickening of the pulmonary interstitium.  Mild pulmonary venous congestion without frank evidence of pulmonary edema.  There is mild thickening along the right minor fissure.  No focal airspace opacities.  No pleural effusion or pneumothorax. Redemonstrated mildly accentuated thoracic kyphosis.  Post cholecystectomy.  IMPRESSION: Mild pulmonary venous congestion without frank evidence of pulmonary edema.  Original Report Authenticated By: Rachel Moulds, M.D.   Dg Chest 2 View  02/10/2012  *RADIOLOGY REPORT*  Clinical Data: Chest pain  CHEST - 2 VIEW  Comparison: 10/26/2010  Findings: Lungs are markedly under aerated and grossly clear. Cardiac silhouette is prominent.  No pneumothorax and no pleural effusion.  IMPRESSION: Low lung volumes.  No active cardiopulmonary disease.  Original Report Authenticated By: Jamas Lav, M.D.        MDM  Iv ns. Pt already has had full strength asa today. No cp today.  Reviewed nursing notes and prior charts for additional history.     Date: 02/12/2012  Rate: 57  Rhythm: sinus bradycardia  QRS Axis: normal  Intervals: PR prolonged  ST/T Wave abnormalities: normal  Conduction Disutrbances:first-degree A-V block   Narrative Interpretation:   Old EKG Reviewed: changes noted  Pt requests oxycodone 10 mg po - ordered percocet 2 po.   Discussed w card on call given hx and sl elev trop - dr Wynonia Lawman states he will consult, but requests med service admit.   Discussed w triad, will admit,.  Recheck pt no cp.       Mirna Mires, MD 02/12/12 [facility]

## 2012-02-12 NOTE — ED Notes (Addendum)
Pt c/o episode of CP on Wednesday and was seen at [facility] and left AMA; pt c/o SOB and sts is out of pain meds; pt denies CP at present

## 2012-02-12 NOTE — ED Notes (Addendum)
Gave report to Angie, RN on 4700; no other questions or concerns at this time.  Informed RN that she can call back with any questions/concerns once patient arrives to floor.  Preparing patient for transport.

## 2012-02-12 NOTE — ED Notes (Signed)
Pt requesting water to take her oxycodone with.  Pt also reports "I'm hungry but I don't have any money".  Pt instructed to await MD evaluation for PO intake.

## 2012-02-12 NOTE — ED Notes (Signed)
Calling report now.

## 2012-02-12 NOTE — ED Notes (Signed)
Pt ambulatory to and from radiology with tech and tolerated well.

## 2012-02-12 NOTE — ED Notes (Signed)
IV team at bedside attempting IV access

## 2012-02-13 LAB — CBC
Hemoglobin: 13.4 g/dL (ref 12.0–15.0)
MCH: 29.2 pg (ref 26.0–34.0)
MCV: 89.3 fL (ref 78.0–100.0)
RBC: 4.59 MIL/uL (ref 3.87–5.11)

## 2012-02-13 LAB — CARDIAC PANEL(CRET KIN+CKTOT+MB+TROPI): Relative Index: INVALID (ref 0.0–2.5)

## 2012-02-13 LAB — CREATININE, SERUM
Creatinine, Ser: 1.26 mg/dL — ABNORMAL HIGH (ref 0.50–1.10)
GFR calc Af Amer: 46 mL/min — ABNORMAL LOW (ref >90)
GFR calc non Af Amer: 39 mL/min — ABNORMAL LOW (ref >90)

## 2012-02-13 LAB — POTASSIUM: Potassium: 4.5 mEq/L (ref 3.5–5.1)

## 2012-02-13 LAB — BASIC METABOLIC PANEL
CO2: 25 mEq/L (ref 19–32)
GFR calc non Af Amer: 39 mL/min — ABNORMAL LOW (ref >90)
Glucose, Bld: 142 mg/dL — ABNORMAL HIGH (ref 70–99)
Potassium: 4.3 mEq/L (ref 3.5–5.1)
Sodium: 141 mEq/L (ref 135–145)

## 2012-02-13 MED ORDER — FUROSEMIDE 40 MG PO TABS
40.0000 mg | ORAL_TABLET | Freq: Every day | ORAL | Status: DC
  Administered 2012-02-13: 40 mg via ORAL
  Filled 2012-02-13 (×2): qty 1

## 2012-02-13 MED ORDER — ZOLPIDEM TARTRATE 5 MG PO TABS
10.0000 mg | ORAL_TABLET | Freq: Every day | ORAL | Status: DC
  Administered 2012-02-13 – 2012-02-14 (×3): 10 mg via ORAL
  Filled 2012-02-13 (×3): qty 2

## 2012-02-13 MED ORDER — METFORMIN HCL 500 MG PO TABS
1000.0000 mg | ORAL_TABLET | Freq: Two times a day (BID) | ORAL | Status: DC
  Administered 2012-02-13 – 2012-02-15 (×4): 1000 mg via ORAL
  Filled 2012-02-13 (×6): qty 2

## 2012-02-13 MED ORDER — ASPIRIN 81 MG PO CHEW
81.0000 mg | CHEWABLE_TABLET | Freq: Every day | ORAL | Status: DC
  Administered 2012-02-14: 81 mg via ORAL

## 2012-02-13 NOTE — Progress Notes (Signed)
*  PRELIMINARY RESULTS* Echocardiogram 2D Echocardiogram has been performed.  Kristine Oliver 02/13/2012, 11:01 AM

## 2012-02-13 NOTE — Progress Notes (Signed)
Subjective: Patient without any complaint of shortness of breath or chest pain however does dyspnea with exertion on several occasions. She has been seen by cardiology, echocardiogram is pending, further evaluation pending results. Her labs did reveal an elevated calcium, patient on calcium supplements. We will check prior records for lab results and if any previous evaluation, for now check PTH  Objective: Vital signs in last 24 hours: Temp:  [97.8 F (36.6 C)-98.6 F (37 C)] 97.8 F (36.6 C) (07/13 0308) Pulse Rate:  [51-67] 67  (07/13 0933) Resp:  [15-19] 18  (07/13 0308) BP: (130-172)/(47-99) 130/69 mmHg (07/13 0933) SpO2:  [91 %-100 %] 97 % (07/13 0308) Weight:  [91.717 kg (202 lb 3.2 oz)-92.715 kg (204 lb 6.4 oz)] 91.717 kg (202 lb 3.2 oz) (07/13 [id number]) Weight change:  Last BM Date: 02/12/12  Intake/Output from previous day: 07/12 0701 - 07/13 0700 In: 350 [P.O.:350] Out: 1000 [Urine:1000] Intake/Output this shift: Total I/O In: 483 [P.O.:480; I.V.:3] Out: 750 [Urine:750]  General appearance: alert and cooperative Resp: clear to auscultation bilaterally Cardio: regular rate and rhythm, S1, S2 normal, no murmur, click, rub or gallop Extremities: extremities normal, atraumatic, no cyanosis or edema  Lab Results:  Results for orders placed during the hospital encounter of 02/12/12 (from the past 24 hour(s))  CBC     Status: Normal   Collection Time   02/12/12  2:11 PM      Component Value Range   WBC 8.2  4.0 - 10.5 K/uL   RBC 4.53  3.87 - 5.11 MIL/uL   Hemoglobin 13.5  12.0 - 15.0 g/dL   HCT 40.4  36.0 - 46.0 %   MCV 89.2  78.0 - 100.0 fL   MCH 29.8  26.0 - 34.0 pg   MCHC 33.4  30.0 - 36.0 g/dL   RDW 15.5  11.5 - 15.5 %   Platelets 231  150 - 400 K/uL  BASIC METABOLIC PANEL     Status: Abnormal   Collection Time   02/12/12  2:11 PM      Component Value Range   Sodium 137  135 - 145 mEq/L   Potassium 4.5  3.5 - 5.1 mEq/L   Chloride 104  96 - 112 mEq/L   CO2 23   19 - 32 mEq/L   Glucose, Bld 131 (*) 70 - 99 mg/dL   BUN 21  6 - 23 mg/dL   Creatinine, Ser 1.13 (*) 0.50 - 1.10 mg/dL   Calcium 11.5 (*) 8.4 - 10.5 mg/dL   GFR calc non Af Amer 45 (*) >90 mL/min   GFR calc Af Amer 52 (*) >90 mL/min  PRO B NATRIURETIC PEPTIDE     Status: Abnormal   Collection Time   02/12/12  2:11 PM      Component Value Range   Pro B Natriuretic peptide (BNP) 1227.0 (*) 0 - 450 pg/mL  PROTIME-INR     Status: Normal   Collection Time   02/12/12  2:11 PM      Component Value Range   Prothrombin Time 13.2  11.6 - 15.2 seconds   INR 0.98  0.00 - 1.49  POCT I-STAT TROPONIN I     Status: Abnormal   Collection Time   02/12/12  2:56 PM      Component Value Range   Troponin i, poc 0.09 (*) 0.00 - 0.08 ng/mL   Comment NOTIFIED PHYSICIAN     Comment 3  TROPONIN I     Status: Normal   Collection Time   02/12/12  4:54 PM      Component Value Range   Troponin I <0.30  <0.30 ng/mL  TSH     Status: Normal   Collection Time   02/12/12  7:21 PM      Component Value Range   TSH 1.159  0.350 - 4.500 uIU/mL  COMPREHENSIVE METABOLIC PANEL     Status: Abnormal   Collection Time   02/12/12  7:21 PM      Component Value Range   Sodium 140  135 - 145 mEq/L   Potassium 4.5  3.5 - 5.1 mEq/L   Chloride 105  96 - 112 mEq/L   CO2 25  19 - 32 mEq/L   Glucose, Bld 108 (*) 70 - 99 mg/dL   BUN 19  6 - 23 mg/dL   Creatinine, Ser 1.03  0.50 - 1.10 mg/dL   Calcium 11.4 (*) 8.4 - 10.5 mg/dL   Total Protein 7.2  6.0 - 8.3 g/dL   Albumin 3.9  3.5 - 5.2 g/dL   AST 24  0 - 37 U/L   ALT 18  0 - 35 U/L   Alkaline Phosphatase 114  39 - 117 U/L   Total Bilirubin 0.3  0.3 - 1.2 mg/dL   GFR calc non Af Amer 50 (*) >90 mL/min   GFR calc Af Amer 58 (*) >90 mL/min  CARDIAC PANEL(CRET KIN+CKTOT+MB+TROPI)     Status: Abnormal   Collection Time   02/12/12  7:22 PM      Component Value Range   Total CK 81  7 - 177 U/L   CK, MB 4.4 (*) 0.3 - 4.0 ng/mL   Troponin I <0.30  <0.30 ng/mL    Relative Index RELATIVE INDEX IS INVALID  0.0 - 2.5  CBC     Status: Abnormal   Collection Time   02/12/12 11:56 PM      Component Value Range   WBC 9.6  4.0 - 10.5 K/uL   RBC 4.59  3.87 - 5.11 MIL/uL   Hemoglobin 13.4  12.0 - 15.0 g/dL   HCT 41.0  36.0 - 46.0 %   MCV 89.3  78.0 - 100.0 fL   MCH 29.2  26.0 - 34.0 pg   MCHC 32.7  30.0 - 36.0 g/dL   RDW 15.7 (*) 11.5 - 15.5 %   Platelets 244  150 - 400 K/uL  CREATININE, SERUM     Status: Abnormal   Collection Time   02/12/12 11:56 PM      Component Value Range   Creatinine, Ser 1.26 (*) 0.50 - 1.10 mg/dL   GFR calc non Af Amer 39 (*) >90 mL/min   GFR calc Af Amer 46 (*) >90 mL/min  CARDIAC PANEL(CRET KIN+CKTOT+MB+TROPI)     Status: Abnormal   Collection Time   02/13/12  2:48 AM      Component Value Range   Total CK 87  7 - 177 U/L   CK, MB 4.2 (*) 0.3 - 4.0 ng/mL   Troponin I <0.30  <0.30 ng/mL   Relative Index RELATIVE INDEX IS INVALID  0.0 - 2.5  BASIC METABOLIC PANEL     Status: Abnormal   Collection Time   02/13/12  6:00 AM      Component Value Range   Sodium 141  135 - 145 mEq/L   Potassium 4.3  3.5 - 5.1 mEq/L   Chloride 108  96 - 112 mEq/L   CO2 25  19 - 32 mEq/L   Glucose, Bld 142 (*) 70 - 99 mg/dL   BUN 23  6 - 23 mg/dL   Creatinine, Ser 1.27 (*) 0.50 - 1.10 mg/dL   Calcium 11.1 (*) 8.4 - 10.5 mg/dL   GFR calc non Af Amer 39 (*) >90 mL/min   GFR calc Af Amer 45 (*) >90 mL/min      Studies/Results: Dg Chest 2 View  02/12/2012  *RADIOLOGY REPORT*  Clinical Data: Chest pain, shortness of breath  CHEST - 2 VIEW  Comparison: 02/10/2012; 10/26/2010; 02/18/2008  Findings: Grossly unchanged cardiac silhouette and mediastinal contours given improved inspiratory effort.  There is persistent mild eventration of the right hemidiaphragm.  There is chronic mild diffuse thickening of the pulmonary interstitium.  Mild pulmonary venous congestion without frank evidence of pulmonary edema.  There is mild thickening along the right  minor fissure.  No focal airspace opacities.  No pleural effusion or pneumothorax. Redemonstrated mildly accentuated thoracic kyphosis.  Post cholecystectomy.  IMPRESSION: Mild pulmonary venous congestion without frank evidence of pulmonary edema.  Original Report Authenticated By: Rachel Moulds, M.Oliver.    Medications:  Prior to Admission:  Prescriptions prior to admission  Medication Sig Dispense Refill  . ALPRAZolam (XANAX) 0.25 MG tablet Take 0.25 mg by mouth 3 (three) times daily as needed. anxiety      . aspirin 325 MG tablet Take 325 mg by mouth daily.      . clindamycin (CLEOCIN T) 1 % lotion Apply 1 application topically 2 (two) times daily. For face      . clopidogrel (PLAVIX) 75 MG tablet Take 75 mg by mouth daily.      Marland Kitchen estrogen-methylTESTOSTERone 0.625-1.25 MG per tablet Take 1 tablet by mouth daily.      Marland Kitchen gabapentin (NEURONTIN) 300 MG capsule Take 300 mg by mouth 2 (two) times daily.      Marland Kitchen levothyroxine (SYNTHROID, LEVOTHROID) 75 MCG tablet Take 75 mcg by mouth daily.      Marland Kitchen lidocaine (LIDODERM) 5 % Place 1 patch onto the skin daily. Remove & Discard patch within 12 hours or as directed by MD      . metFORMIN (GLUCOPHAGE) 1000 MG tablet Take 1,000 mg by mouth 2 (two) times daily with a meal.      . omeprazole (PRILOSEC) 20 MG capsule Take 20 mg by mouth daily.      Marland Kitchen oxycodone (OXY-IR) 5 MG capsule Take 5 mg by mouth every 6 (six) hours as needed. pain      . ramipril (ALTACE) 5 MG tablet Take 5 mg by mouth daily.      . valACYclovir (VALTREX) 500 MG tablet Take 500 mg by mouth 2 (two) times daily.      Marland Kitchen zolpidem (AMBIEN) 10 MG tablet Take 10 mg by mouth at bedtime.       Scheduled:   . aspirin  81 mg Oral Daily  . clindamycin  1 application Topical BID  . clopidogrel  75 mg Oral QAC breakfast  . enoxaparin (LOVENOX) injection  40 mg Subcutaneous QHS  . furosemide  40 mg Intravenous Once  . furosemide  40 mg Intravenous Q12H  . furosemide  40 mg Oral Daily  .  gabapentin  300 mg Oral BID  . levothyroxine  75 mcg Oral QAC breakfast  . lidocaine  1 patch Transdermal Q24H  . oxyCODONE-acetaminophen  2 tablet Oral Once  . pantoprazole  40 mg Oral Q1200  . ramipril  5 mg Oral Daily  . sodium chloride  3 mL Intravenous Q12H  . valACYclovir  500 mg Oral BID  . zolpidem  10 mg Oral QHS  . DISCONTD: aspirin  325 mg Oral Daily  . DISCONTD: furosemide  40 mg Intravenous Once  . DISCONTD: furosemide  40 mg Intravenous Q12H  . DISCONTD: ramipril  5 mg Oral Daily  . DISCONTD: sodium chloride  3 mL Intravenous Q12H  . DISCONTD: zolpidem  10 mg Oral QHS   Continuous:   Assessment/Plan: Dyspnea on exertion, current enzymes negative, BNP is elevated, she is already on Lasix, currently without any symptoms of shortness of breath no rales on exam no lower extremity edema await further evaluation by cardiology Diabetes resume home medication Hypertension, continue current meds Chronic pain. Patient states she is out of her pain medicine for now continue current meds Hypercalcemia, as discussed above  LOS: 1 day   Kristine Oliver 02/13/2012, 11:21 AM

## 2012-02-13 NOTE — Progress Notes (Signed)
Subjective:  Feeling better.  No chest pain.  Not SOB today.  Objective:  Vital Signs in the last 24 hours: BP 130/69  Pulse 67  Temp 97.8 F (36.6 C) (Oral)  Resp 18  Ht 5\' 1"  (1.549 m)  Wt 91.717 kg (202 lb 3.2 oz)  BMI 38.21 kg/m2  SpO2 97%  Physical Exam: Obese WF in NAD Lungs:  Clear Cardiac:  Regular rhythm, normal S1 and S2, no S3 Extremities:  No edema present  Intake/Output from previous day: 07/12 0701 - 07/13 0700 In: 350 [P.O.:350] Out: 1000 [Urine:1000]  Weight Filed Weights   02/12/12 2332 02/13/12 0658  Weight: 92.715 kg (204 lb 6.4 oz) 91.717 kg (202 lb 3.2 oz)    Lab Results: Basic Metabolic Panel:  Basename 02/13/12 0600 02/12/12 2356 02/12/12 1921  NA 141 -- 140  K 4.3 -- 4.5  CL 108 -- 105  CO2 25 -- 25  GLUCOSE 142* -- 108*  BUN 23 -- 19  CREATININE 1.27* 1.26* --   CBC:  Basename 02/12/12 2356 02/12/12 1411  WBC 9.6 8.2  NEUTROABS -- --  HGB 13.4 13.5  HCT 41.0 40.4  MCV 89.3 89.2  PLT 244 231   Cardiac Enzymes:  Basename 02/13/12 0248 02/12/12 1922 02/12/12 1654  CKTOTAL 87 81 --  CKMB 4.2* 4.4* --  CKMBINDEX -- -- --  TROPONINI <0.30 <0.30 <0.30    Telemetry: Sinus rhythm  Assessment/Plan:  1. Acute diastolic CHF improved awiting ECHO 2. History of CAD and stent 3. Hypercalcemia needs w/u  Rec:  Needs w/u of hypercalcemia.  Add Lasix.  Await ECHO.   Kerry Hough  MD [facility]  02/13/2012, 10:08 AM

## 2012-02-14 NOTE — Progress Notes (Signed)
Subjective: Patient doing well, no complaints of chest pain shortness of breath, echo report pending. Case briefly discussed with cardiology, he would like to continue diuresis, disposition pending clearance cardiology. In regards to elevated calcium, PTH is pending  Objective: Vital signs in last 24 hours: Temp:  [97.7 F (36.5 C)-97.9 F (36.6 C)] 97.7 F (36.5 C) (07/14 0523) Pulse Rate:  [59-75] 64  (07/14 0523) Resp:  [18] 18  (07/14 0523) BP: (118-145)/(44-70) 140/49 mmHg (07/14 0523) SpO2:  [92 %-100 %] 92 % (07/14 0523) Weight:  [90.447 kg (199 lb 6.4 oz)] 90.447 kg (199 lb 6.4 oz) (07/14 0523) Weight change: -2.268 kg (-5 lb) Last BM Date: 02/12/12  Intake/Output from previous day: 07/13 0701 - 07/14 0700 In: 1086 [P.O.:1080; I.V.:6] Out: 1975 [Urine:1975] Intake/Output this shift:    General appearance: alert and cooperative Resp: clear to auscultation bilaterally Cardio: regular rate and rhythm, S1, S2 normal, no murmur, click, rub or gallop Extremities: extremities normal, atraumatic, no cyanosis or edema  Lab Results:  Results for orders placed during the hospital encounter of 02/12/12 (from the past 24 hour(s))  POTASSIUM     Status: Normal   Collection Time   02/13/12  7:37 PM      Component Value Range   Potassium 4.5  3.5 - 5.1 mEq/L      Studies/Results: Dg Chest 2 View  02/12/2012  *RADIOLOGY REPORT*  Clinical Data: Chest pain, shortness of breath  CHEST - 2 VIEW  Comparison: 02/10/2012; 10/26/2010; 02/18/2008  Findings: Grossly unchanged cardiac silhouette and mediastinal contours given improved inspiratory effort.  There is persistent mild eventration of the right hemidiaphragm.  There is chronic mild diffuse thickening of the pulmonary interstitium.  Mild pulmonary venous congestion without frank evidence of pulmonary edema.  There is mild thickening along the right minor fissure.  No focal airspace opacities.  No pleural effusion or pneumothorax.  Redemonstrated mildly accentuated thoracic kyphosis.  Post cholecystectomy.  IMPRESSION: Mild pulmonary venous congestion without frank evidence of pulmonary edema.  Original Report Authenticated By: Rachel Moulds, M.D.    Medications:  Prior to Admission:  Prescriptions prior to admission  Medication Sig Dispense Refill  . ALPRAZolam (XANAX) 0.25 MG tablet Take 0.25 mg by mouth 3 (three) times daily as needed. anxiety      . aspirin 325 MG tablet Take 325 mg by mouth daily.      . clindamycin (CLEOCIN T) 1 % lotion Apply 1 application topically 2 (two) times daily. For face      . clopidogrel (PLAVIX) 75 MG tablet Take 75 mg by mouth daily.      Marland Kitchen estrogen-methylTESTOSTERone 0.625-1.25 MG per tablet Take 1 tablet by mouth daily.      Marland Kitchen gabapentin (NEURONTIN) 300 MG capsule Take 300 mg by mouth 2 (two) times daily.      Marland Kitchen levothyroxine (SYNTHROID, LEVOTHROID) 75 MCG tablet Take 75 mcg by mouth daily.      Marland Kitchen lidocaine (LIDODERM) 5 % Place 1 patch onto the skin daily. Remove & Discard patch within 12 hours or as directed by MD      . metFORMIN (GLUCOPHAGE) 1000 MG tablet Take 1,000 mg by mouth 2 (two) times daily with a meal.      . omeprazole (PRILOSEC) 20 MG capsule Take 20 mg by mouth daily.      Marland Kitchen oxycodone (OXY-IR) 5 MG capsule Take 5 mg by mouth every 6 (six) hours as needed. pain      .  ramipril (ALTACE) 5 MG tablet Take 5 mg by mouth daily.      . valACYclovir (VALTREX) 500 MG tablet Take 500 mg by mouth 2 (two) times daily.      Marland Kitchen zolpidem (AMBIEN) 10 MG tablet Take 10 mg by mouth at bedtime.       Scheduled:   . aspirin  81 mg Oral Daily  . clindamycin  1 application Topical BID  . clopidogrel  75 mg Oral QAC breakfast  . enoxaparin (LOVENOX) injection  40 mg Subcutaneous QHS  . furosemide  40 mg Intravenous Q12H  . furosemide  40 mg Oral Daily  . gabapentin  300 mg Oral BID  . levothyroxine  75 mcg Oral QAC breakfast  . lidocaine  1 patch Transdermal Q24H  . metFORMIN   1,000 mg Oral BID WC  . pantoprazole  40 mg Oral Q1200  . ramipril  5 mg Oral Daily  . sodium chloride  3 mL Intravenous Q12H  . valACYclovir  500 mg Oral BID  . zolpidem  10 mg Oral QHS  . DISCONTD: aspirin  325 mg Oral Daily   Continuous:   Assessment/Plan: CHF, continue diuresis, await echo report. Disposition pending clearance by cardiology Hypercalcemia , PTH pending Diabetes Hypertension recommend continue current medications  LOS: 2 days   Malayshia All D 02/14/2012, 9:54 AM

## 2012-02-14 NOTE — Progress Notes (Signed)
Subjective:  C/0 weakness from lasix.  Not SOB no recurrent chest pain.  Objective:  Vital Signs in the last 24 hours: BP 140/49  Pulse 64  Temp 97.7 F (36.5 C) (Oral)  Resp 18  Ht 5\' 1"  (1.549 m)  Wt 90.447 kg (199 lb 6.4 oz)  BMI 37.68 kg/m2  SpO2 92%  Physical Exam: Obese WF in NAD Lungs:  Clear Cardiac:  Regular rhythm, normal S1 and S2, no S3 Extremities:  No edema present  Intake/Output from previous day: 07/13 0701 - 07/14 0700 In: 1086 [P.O.:1080; I.V.:6] Out: 1975 [Urine:1975]  Weight Filed Weights   02/12/12 2332 02/13/12 0658 02/14/12 0523  Weight: 92.715 kg (204 lb 6.4 oz) 91.717 kg (202 lb 3.2 oz) 90.447 kg (199 lb 6.4 oz)    Lab Results: Basic Metabolic Panel:  Basename 02/13/12 1937 02/13/12 0600 02/12/12 2356 02/12/12 1921  NA -- 141 -- 140  K 4.5 4.3 -- --  CL -- 108 -- 105  CO2 -- 25 -- 25  GLUCOSE -- 142* -- 108*  BUN -- 23 -- 19  CREATININE -- 1.27* 1.26* --   CBC:  Basename 02/12/12 2356 02/12/12 1411  WBC 9.6 8.2  NEUTROABS -- --  HGB 13.4 13.5  HCT 41.0 40.4  MCV 89.3 89.2  PLT 244 231   Cardiac Enzymes:  Basename 02/13/12 0248 02/12/12 1922 02/12/12 1654  CKTOTAL 87 81 --  CKMB 4.2* 4.4* --  CKMBINDEX -- -- --  TROPONINI <0.30 <0.30 <0.30    Telemetry: Sinus rhythm  Echo:  LVH with diastolic dysfunction.  MIld LAE.  Normal EF.  Assessment/Plan:  1. Acute diastolic CHF improved awiting ECHO 2. History of CAD and stent 3. Hypercalcemia needs w/u  Rec:  Needs w/u of hypercalcemia. Weight down 5 pounds.  May be able to go hojme soon.   Kerry Hough  MD [facility]  02/14/2012, 9:54 AM

## 2012-02-15 LAB — PARATHYROID HORMONE, INTACT (NO CA): PTH: 427.9 pg/mL — ABNORMAL HIGH (ref 14.0–72.0)

## 2012-02-15 MED ORDER — POTASSIUM CHLORIDE ER 8 MEQ PO TBCR: 8.0000 meq | EXTENDED_RELEASE_TABLET | Freq: Two times a day (BID) | ORAL | Status: DC

## 2012-02-15 MED ORDER — FUROSEMIDE 40 MG PO TABS: 40.0000 mg | ORAL_TABLET | Freq: Every day | ORAL | Status: DC

## 2012-02-15 MED ORDER — METFORMIN HCL 500 MG PO TABS: 1000.0000 mg | ORAL_TABLET | Freq: Two times a day (BID) | ORAL | Status: DC

## 2012-02-15 MED ORDER — ASPIRIN 81 MG PO CHEW: 81.0000 mg | CHEWABLE_TABLET | Freq: Every day | ORAL | Status: AC

## 2012-02-15 NOTE — Progress Notes (Signed)
Pt refused morning meds and stated would like to take them at home. Educated on medications she has already taken and what she needs to take at home. Pt verbalized understanding.  Eulis Canner, RN

## 2012-02-15 NOTE — Progress Notes (Signed)
Discussed discharge instructions with pt including follow up appts, how and when to call the dr, CHF education, medications to take at home, diet, and activity. Pt c/o pain of "4" out of 10 in back. PRN pain med given. Pt verbalized understanding and denied any questions. Prescriptions for furosemide and potassium given and pt acknowledged receipt. Pt waiting for son to arrive to transport home. Will continue to monitor pt until she leaves.  Eulis Canner, RN

## 2012-02-15 NOTE — Discharge Summary (Signed)
Physician Discharge Summary  Patient ID: Kristine Oliver MRN: [id number] DOB/AGE: 1931-01-04 78 y.o.  Admit date: 02/12/2012 Discharge date: 02/15/2012  Admission Diagnoses: Acute diastolic heart failure Coronary artery disease Type 2 diabetes Obesity Hypertension Primary hyperparathyroidism  Discharge Diagnoses:  Principal Problem:  *Acute diastolic heart failure Active Problems:  CAD (coronary artery disease)  Type 2 diabetes mellitus with vascular disease  Obesity (BMI 30-39.9)  Hypertensive heart disease without CHF Primary hyperparathyroidism   Discharged Condition: good  Hospital Course: The patient was admitted on July 12 complaining of worsening dyspnea on exertion shortness of breath as well as an episode of chest pain. The patient had been to the emergency room 2 days prior to admission had been recommended that she did decline. In the emergency room she was noted to have normal cardiac enzymes but elevated BNP and was admitted for treatment of congestive heart failure. Her chest x-ray showed mild pulmonary venous congestion without frank evidence of pulmonary edema. The patient was admitted and placed on IV Lasix. She was seen in consultation by cardiology who felt that the patient had acute diastolic congestive heart fire. She did have known coronary disease with drug-eluting stent in the LAD 16 months prior. The patient was treated with IV diuretics and her symptoms improved and by discharge she was close to baseline. HEENT echocardiogram showed moderate concentric LVH, systolic function between 65-70% and grade 1 diastolic dysfunction. The patient was switched to oral Lasix and this will be continued with potassium as an outpatient. She was advised to limit sodium intake and to monitor blood pressure carefully at home. Her calcium was 11.1 at discharge, she has diagnosed primary hyperparathyroidism which has been followed as an outpatient.  Consults: cardiology  Significant  Diagnostic Studies: labs: As above and radiology: CXR: air trapping/emphysema and Pulmonary venous congestion  Treatments: cardiac meds: furosemide  Discharge Exam: Blood pressure 118/80, pulse 56, temperature 97.9 F (36.6 C), temperature source Oral, resp. rate 20, height 5\' 1"  (1.549 m), weight 90.6 kg (199 lb 11.8 oz), SpO2 97.00%. General appearance: alert and cooperative Resp: clear to auscultation bilaterally Cardio: regular rate and rhythm, S1, S2 normal, no murmur, click, rub or gallop  Disposition: 01-Home or Self Care   Medication List  As of 02/15/2012  8:06 AM   STOP taking these medications         aspirin 325 MG tablet         TAKE these medications         ALPRAZolam 0.25 MG tablet   Commonly known as: XANAX   Take 0.25 mg by mouth 3 (three) times daily as needed. anxiety      aspirin 81 MG chewable tablet   Chew 1 tablet (81 mg total) by mouth daily.      clindamycin 1 % lotion   Commonly known as: CLEOCIN T   Apply 1 application topically 2 (two) times daily. For face      clopidogrel 75 MG tablet   Commonly known as: PLAVIX   Take 75 mg by mouth daily.      estrogen-methylTESTOSTERone 0.625-1.25 MG per tablet   Take 1 tablet by mouth daily.      furosemide 40 MG tablet   Commonly known as: LASIX   Take 1 tablet (40 mg total) by mouth daily.      gabapentin 300 MG capsule   Commonly known as: NEURONTIN   Take 300 mg by mouth 2 (two) times daily.  levothyroxine 75 MCG tablet   Commonly known as: SYNTHROID, LEVOTHROID   Take 75 mcg by mouth daily.      lidocaine 5 %   Commonly known as: LIDODERM   Place 1 patch onto the skin daily. Remove & Discard patch within 12 hours or as directed by MD      metFORMIN 1000 MG tablet   Commonly known as: GLUCOPHAGE   Take 1,000 mg by mouth 2 (two) times daily with a meal.      omeprazole 20 MG capsule   Commonly known as: PRILOSEC   Take 20 mg by mouth daily.      oxycodone 5 MG capsule    Commonly known as: OXY-IR   Take 5 mg by mouth every 6 (six) hours as needed. pain      potassium chloride 8 MEQ tablet   Commonly known as: KLOR-CON   Take 1 tablet (8 mEq total) by mouth 2 (two) times daily.      ramipril 5 MG tablet   Commonly known as: ALTACE   Take 5 mg by mouth daily.      valACYclovir 500 MG tablet   Commonly known as: VALTREX   Take 500 mg by mouth 2 (two) times daily.      zolpidem 10 MG tablet   Commonly known as: AMBIEN   Take 10 mg by mouth at bedtime.           Follow-up Information    Follow up with [name], MD.   Contact information:   [address] [place] [phone]       Follow up with Irven Shelling, MD in 10 days.   Contact information:   [place], [address] [place], [place]. [place] [phone]          Signed: Irven Shelling 02/15/2012, 8:06 AM

## 2012-05-01 ENCOUNTER — Encounter (HOSPITAL_COMMUNITY): Payer: Self-pay | Admitting: Emergency Medicine

## 2012-05-01 ENCOUNTER — Observation Stay (HOSPITAL_COMMUNITY)
Admission: EM | Admit: 2012-05-01 | Discharge: 2012-05-02 | Disposition: A | Discharge status: Home / Self Care | Payer: Medicare Other | Attending: Cardiology | Admitting: Cardiology

## 2012-05-01 ENCOUNTER — Emergency Department (HOSPITAL_COMMUNITY): Payer: Medicare Other

## 2012-05-01 DIAGNOSIS — I110 Hypertensive heart disease with heart failure: Secondary | ICD-10-CM | POA: Insufficient documentation

## 2012-05-01 DIAGNOSIS — G894 Chronic pain syndrome: Secondary | ICD-10-CM

## 2012-05-01 DIAGNOSIS — I119 Hypertensive heart disease without heart failure: Secondary | ICD-10-CM

## 2012-05-01 DIAGNOSIS — R110 Nausea: Secondary | ICD-10-CM | POA: Insufficient documentation

## 2012-05-01 DIAGNOSIS — E669 Obesity, unspecified: Secondary | ICD-10-CM | POA: Diagnosis present

## 2012-05-01 DIAGNOSIS — E119 Type 2 diabetes mellitus without complications: Secondary | ICD-10-CM | POA: Insufficient documentation

## 2012-05-01 DIAGNOSIS — R0789 Other chest pain: Principal | ICD-10-CM | POA: Insufficient documentation

## 2012-05-01 DIAGNOSIS — E785 Hyperlipidemia, unspecified: Secondary | ICD-10-CM

## 2012-05-01 DIAGNOSIS — Z9861 Coronary angioplasty status: Secondary | ICD-10-CM | POA: Insufficient documentation

## 2012-05-01 DIAGNOSIS — R079 Chest pain, unspecified: Secondary | ICD-10-CM

## 2012-05-01 DIAGNOSIS — I5032 Chronic diastolic (congestive) heart failure: Secondary | ICD-10-CM | POA: Insufficient documentation

## 2012-05-01 DIAGNOSIS — I2510 Atherosclerotic heart disease of native coronary artery without angina pectoris: Secondary | ICD-10-CM

## 2012-05-01 DIAGNOSIS — R0602 Shortness of breath: Secondary | ICD-10-CM | POA: Insufficient documentation

## 2012-05-01 DIAGNOSIS — I509 Heart failure, unspecified: Secondary | ICD-10-CM | POA: Insufficient documentation

## 2012-05-01 DIAGNOSIS — I200 Unstable angina: Secondary | ICD-10-CM

## 2012-05-01 HISTORY — DX: Gastro-esophageal reflux disease without esophagitis: K21.9

## 2012-05-01 HISTORY — DX: Chronic diastolic (congestive) heart failure: I50.32

## 2012-05-01 HISTORY — DX: Irritable bowel syndrome, unspecified: K58.9

## 2012-05-01 HISTORY — DX: Essential (primary) hypertension: I10

## 2012-05-01 LAB — URINE MICROSCOPIC-ADD ON

## 2012-05-01 LAB — CBC WITH DIFFERENTIAL/PLATELET
Basophils Absolute: 0 10*3/uL (ref 0.0–0.1)
HCT: 38.6 % (ref 36.0–46.0)
Lymphocytes Relative: 21 % (ref 12–46)
Monocytes Absolute: 0.7 10*3/uL (ref 0.1–1.0)
Neutro Abs: 5.4 10*3/uL (ref 1.7–7.7)
Neutrophils Relative %: 65 % (ref 43–77)
RDW: 16.2 % — ABNORMAL HIGH (ref 11.5–15.5)
WBC: 8.4 10*3/uL (ref 4.0–10.5)

## 2012-05-01 LAB — TROPONIN I: Troponin I: <0.3 ng/mL (ref <0.30)

## 2012-05-01 LAB — GLUCOSE, CAPILLARY: Glucose-Capillary: 137 mg/dL — ABNORMAL HIGH (ref 70–99)

## 2012-05-01 LAB — CBC
MCHC: 32.8 g/dL (ref 30.0–36.0)
MCV: 95.3 fL (ref 78.0–100.0)
Platelets: 228 10*3/uL (ref 150–400)
RDW: 16.1 % — ABNORMAL HIGH (ref 11.5–15.5)
WBC: 8.4 10*3/uL (ref 4.0–10.5)

## 2012-05-01 LAB — URINALYSIS, ROUTINE W REFLEX MICROSCOPIC
Bilirubin Urine: NEGATIVE
Glucose, UA: NEGATIVE mg/dL
Hgb urine dipstick: NEGATIVE
Specific Gravity, Urine: 1.006 (ref 1.005–1.030)
Urobilinogen, UA: 0.2 mg/dL (ref 0.0–1.0)

## 2012-05-01 LAB — COMPREHENSIVE METABOLIC PANEL
ALT: 21 U/L (ref 0–35)
AST: 23 U/L (ref 0–37)
Albumin: 3.4 g/dL — ABNORMAL LOW (ref 3.5–5.2)
Alkaline Phosphatase: 127 U/L — ABNORMAL HIGH (ref 39–117)
CO2: 21 mEq/L (ref 19–32)
Chloride: 104 mEq/L (ref 96–112)
GFR calc non Af Amer: 63 mL/min — ABNORMAL LOW (ref >90)
Potassium: 4.5 mEq/L (ref 3.5–5.1)
Sodium: 135 mEq/L (ref 135–145)
Total Bilirubin: 0.2 mg/dL — ABNORMAL LOW (ref 0.3–1.2)

## 2012-05-01 LAB — PRO B NATRIURETIC PEPTIDE: Pro B Natriuretic peptide (BNP): 504.7 pg/mL — ABNORMAL HIGH (ref 0–450)

## 2012-05-01 LAB — CREATININE, SERUM
GFR calc Af Amer: 59 mL/min — ABNORMAL LOW (ref >90)
GFR calc non Af Amer: 51 mL/min — ABNORMAL LOW (ref >90)

## 2012-05-01 MED ORDER — ALPRAZOLAM 0.25 MG PO TABS: 0.2500 mg | ORAL_TABLET | Freq: Three times a day (TID) | ORAL | Status: DC | PRN

## 2012-05-01 MED ORDER — INSULIN ASPART 100 UNIT/ML ~~LOC~~ SOLN: 0.0000 [IU] | Freq: Three times a day (TID) | SUBCUTANEOUS | Status: DC

## 2012-05-01 MED ORDER — SODIUM CHLORIDE 0.9 % IJ SOLN: 3.0000 mL | INTRAMUSCULAR | Status: DC | PRN

## 2012-05-01 MED ORDER — ONDANSETRON HCL 4 MG/2ML IJ SOLN: 4.0000 mg | Freq: Four times a day (QID) | INTRAMUSCULAR | Status: DC | PRN

## 2012-05-01 MED ORDER — VALACYCLOVIR HCL 500 MG PO TABS
500.0000 mg | ORAL_TABLET | Freq: Two times a day (BID) | ORAL | Status: DC
  Administered 2012-05-01 – 2012-05-02 (×2): 500 mg via ORAL
  Filled 2012-05-01 (×3): qty 1

## 2012-05-01 MED ORDER — CLOPIDOGREL BISULFATE 75 MG PO TABS
75.0000 mg | ORAL_TABLET | Freq: Every day | ORAL | Status: DC
  Administered 2012-05-02: 75 mg via ORAL
  Filled 2012-05-01: qty 1

## 2012-05-01 MED ORDER — ENOXAPARIN SODIUM 40 MG/0.4ML ~~LOC~~ SOLN
40.0000 mg | SUBCUTANEOUS | Status: DC
  Administered 2012-05-01: 40 mg via SUBCUTANEOUS
  Filled 2012-05-01 (×2): qty 0.4

## 2012-05-01 MED ORDER — RAMIPRIL 5 MG PO CAPS
5.0000 mg | ORAL_CAPSULE | Freq: Every day | ORAL | Status: DC
  Administered 2012-05-02: 5 mg via ORAL
  Filled 2012-05-01: qty 1

## 2012-05-01 MED ORDER — ACETAMINOPHEN 325 MG PO TABS: 650.0000 mg | ORAL_TABLET | ORAL | Status: DC | PRN

## 2012-05-01 MED ORDER — EST ESTROGENS-METHYLTEST 0.625-1.25 MG PO TABS: 1.0000 | ORAL_TABLET | Freq: Every day | ORAL | Status: DC

## 2012-05-01 MED ORDER — INSULIN ASPART 100 UNIT/ML ~~LOC~~ SOLN: 0.0000 [IU] | Freq: Every day | SUBCUTANEOUS | Status: DC

## 2012-05-01 MED ORDER — CLINDAMYCIN PHOSPHATE 1 % EX LOTN: 1.0000 "application " | TOPICAL_LOTION | Freq: Two times a day (BID) | CUTANEOUS | Status: DC

## 2012-05-01 MED ORDER — SODIUM CHLORIDE 0.9 % IV SOLN: 250.0000 mL | INTRAVENOUS | Status: DC | PRN

## 2012-05-01 MED ORDER — RAMIPRIL 5 MG PO TABS: 5.0000 mg | ORAL_TABLET | Freq: Every day | ORAL | Status: DC

## 2012-05-01 MED ORDER — ASPIRIN 81 MG PO CHEW
81.0000 mg | CHEWABLE_TABLET | Freq: Every day | ORAL | Status: DC
  Administered 2012-05-02: 81 mg via ORAL
  Filled 2012-05-01: qty 1

## 2012-05-01 MED ORDER — PANTOPRAZOLE SODIUM 40 MG PO TBEC
40.0000 mg | DELAYED_RELEASE_TABLET | Freq: Every day | ORAL | Status: DC
  Administered 2012-05-02: 40 mg via ORAL
  Filled 2012-05-01: qty 1

## 2012-05-01 MED ORDER — ZOLPIDEM TARTRATE 5 MG PO TABS
5.0000 mg | ORAL_TABLET | Freq: Every day | ORAL | Status: DC
  Administered 2012-05-02: 5 mg via ORAL
  Filled 2012-05-01: qty 1

## 2012-05-01 MED ORDER — LIDOCAINE 5 % EX PTCH
2.0000 | MEDICATED_PATCH | CUTANEOUS | Status: DC
  Administered 2012-05-01: 2 via TRANSDERMAL
  Filled 2012-05-01 (×3): qty 2

## 2012-05-01 MED ORDER — OXYCODONE HCL 10 MG PO TABS: 10.0000 mg | ORAL_TABLET | Freq: Four times a day (QID) | ORAL | Status: DC

## 2012-05-01 MED ORDER — SODIUM CHLORIDE 0.9 % IJ SOLN
3.0000 mL | Freq: Two times a day (BID) | INTRAMUSCULAR | Status: DC
  Administered 2012-05-02: 3 mL via INTRAVENOUS

## 2012-05-01 MED ORDER — OXYCODONE HCL 5 MG PO TABS
10.0000 mg | ORAL_TABLET | Freq: Four times a day (QID) | ORAL | Status: DC
  Administered 2012-05-01 – 2012-05-02 (×4): 10 mg via ORAL
  Filled 2012-05-01 (×4): qty 2

## 2012-05-01 MED ORDER — LEVOTHYROXINE SODIUM 75 MCG PO TABS
75.0000 ug | ORAL_TABLET | Freq: Every day | ORAL | Status: DC
  Administered 2012-05-02: 75 ug via ORAL
  Filled 2012-05-01: qty 1

## 2012-05-01 MED ORDER — SIMVASTATIN 10 MG PO TABS
10.0000 mg | ORAL_TABLET | Freq: Every day | ORAL | Status: DC
  Administered 2012-05-02: 10 mg via ORAL
  Filled 2012-05-01: qty 1

## 2012-05-01 MED ORDER — GABAPENTIN 300 MG PO CAPS
300.0000 mg | ORAL_CAPSULE | Freq: Two times a day (BID) | ORAL | Status: DC
  Administered 2012-05-01 – 2012-05-02 (×2): 300 mg via ORAL
  Filled 2012-05-01 (×3): qty 1

## 2012-05-01 MED ORDER — AMLODIPINE BESYLATE 10 MG PO TABS
10.0000 mg | ORAL_TABLET | Freq: Every day | ORAL | Status: DC
  Administered 2012-05-01 – 2012-05-02 (×2): 10 mg via ORAL
  Filled 2012-05-01 (×2): qty 1

## 2012-05-01 MED ORDER — NITROGLYCERIN 0.4 MG SL SUBL: 0.4000 mg | SUBLINGUAL_TABLET | SUBLINGUAL | Status: DC | PRN

## 2012-05-01 NOTE — ED Provider Notes (Signed)
History     CSN: [id number]  Arrival date & time 05/01/12  52   First MD Initiated Contact with Patient 05/01/12 1325      Chief Complaint  Patient presents with  . Chest Pain    (Consider location/radiation/quality/duration/timing/severity/associated sxs/prior treatment) Patient is a 78 y.o. female presenting with chest pain. The history is provided by the patient.  Chest Pain The chest pain began 3 - 5 hours ago. Duration of episode(s) is 1 hour. Chest pain occurs constantly. The chest pain is resolved. Associated with: started while eating breakfast this am. At its most intense, the pain is at 7/10. The pain is currently at 0/10. The severity of the pain is moderate. The quality of the pain is described as heavy and pressure-like. The pain radiates to the left arm (throat). Primary symptoms include shortness of breath and nausea. Pertinent negatives for primary symptoms include no cough and no vomiting.  Associated symptoms include diaphoresis. She tried nitroglycerin (took 4 NTG in 30 min and sx finally resolved) for the symptoms. Risk factors include no known risk factors.  Her past medical history is significant for CAD, hyperlipidemia and hypertension.  Procedure history is positive for cardiac catheterization. Procedure history comments: Last stent was placed in 2006.     Past Medical History  Diagnosis Date  . MI (myocardial infarction)   . CAD (coronary artery disease) 02/12/2012    2.7 x 28 mm Taxus stent to LAD 2006 for nonstemi Cath 3/12 Dr. Marlou Porch  Normal left main, patent LAD stents with jailed septal, 30% circumflex, 30% RCA    . Type 2 diabetes mellitus with vascular disease   . Hypertensive heart disease without CHF   . Obesity (BMI 30-39.9)   . Chronic pain disorder   . Hypothyroidism   . Hyperlipidemia   . Lumbar disc disease   . Bipolar affective disorder   . Scoliosis     Past Surgical History  Procedure Date  . Cholecystectomy   . Lumbar laminectomy    . Abdominal hysterectomy   . Dilation and curettage of uterus   . Pilonidal cyst excision   . Rotator cuff repair     Family History  Problem Relation Age of Onset  . Coronary artery disease Mother   . Heart failure Father   . Diabetes Father     History  Substance Use Topics  . Smoking status: Former Research scientist (life sciences)  . Smokeless tobacco: Not on file  . Alcohol Use: No     social drinking, not often    OB History    Grav Para Term Preterm Abortions TAB SAB Ect Mult Living                  Review of Systems  Constitutional: Positive for diaphoresis.  Respiratory: Positive for shortness of breath. Negative for cough.   Cardiovascular: Positive for chest pain.  Gastrointestinal: Positive for nausea. Negative for vomiting.  All other systems reviewed and are negative.    Allergies  Prednisone  Home Medications   Current Outpatient Rx  Name Route Sig Dispense Refill  . ALPRAZOLAM 0.25 MG PO TABS Oral Take 0.25 mg by mouth 3 (three) times daily as needed. anxiety    . ASPIRIN 81 MG PO CHEW Oral Chew 1 tablet (81 mg total) by mouth daily. 30 tablet 11  . CLINDAMYCIN PHOSPHATE 1 % EX LOTN Topical Apply 1 application topically 2 (two) times daily. For face    . CLOPIDOGREL BISULFATE 75 MG PO  TABS Oral Take 75 mg by mouth daily.    Marland Kitchen EST ESTROGENS-METHYLTEST 0.625-1.25 MG PO TABS Oral Take 1 tablet by mouth daily.    . FUROSEMIDE 40 MG PO TABS Oral Take 1 tablet (40 mg total) by mouth daily. 30 tablet 11  . GABAPENTIN 300 MG PO CAPS Oral Take 300 mg by mouth 2 (two) times daily.    Marland Kitchen LEVOTHYROXINE SODIUM 75 MCG PO TABS Oral Take 75 mcg by mouth daily.    Marland Kitchen LIDOCAINE 5 % EX PTCH Transdermal Place 1 patch onto the skin daily. Remove & Discard patch within 12 hours or as directed by MD    . METFORMIN HCL 1000 MG PO TABS Oral Take 1,000 mg by mouth 2 (two) times daily with a meal.    . OMEPRAZOLE 20 MG PO CPDR Oral Take 20 mg by mouth daily.    . OXYCODONE HCL 5 MG PO CAPS Oral Take  5 mg by mouth every 6 (six) hours as needed. pain    . POTASSIUM CHLORIDE ER 8 MEQ PO TBCR Oral Take 1 tablet (8 mEq total) by mouth 2 (two) times daily. 30 tablet 11  . RAMIPRIL 5 MG PO TABS Oral Take 5 mg by mouth daily.    Marland Kitchen VALACYCLOVIR HCL 500 MG PO TABS Oral Take 500 mg by mouth 2 (two) times daily.    Marland Kitchen ZOLPIDEM TARTRATE 10 MG PO TABS Oral Take 10 mg by mouth at bedtime.      BP 139/104  Pulse 53  Temp 98.9 F (37.2 C) (Oral)  Resp 20  SpO2 100%  Physical Exam  Nursing note and vitals reviewed. Constitutional: She is oriented to person, place, and time. She appears well-developed and well-nourished. No distress.  HENT:  Head: Normocephalic and atraumatic.  Mouth/Throat: Oropharynx is clear and moist.  Eyes: Conjunctivae normal and EOM are normal. Pupils are equal, round, and reactive to light.  Neck: Normal range of motion. Neck supple.  Cardiovascular: Regular rhythm and intact distal pulses.  Bradycardia present.   No murmur heard. Pulmonary/Chest: Effort normal and breath sounds normal. No respiratory distress. She has no wheezes. She has no rales.  Abdominal: Soft. She exhibits no distension. There is no tenderness. There is no rebound and no guarding.  Musculoskeletal: Normal range of motion. She exhibits no edema and no tenderness.  Neurological: She is alert and oriented to person, place, and time.  Skin: Skin is warm and dry. No rash noted. No erythema.  Psychiatric: She has a normal mood and affect. Her behavior is normal.    ED Course  Procedures (including critical care time)  Labs Reviewed  CBC WITH DIFFERENTIAL - Abnormal; Notable for the following:    RDW 16.2 (*)     Eosinophils Relative 6 (*)     All other components within normal limits  COMPREHENSIVE METABOLIC PANEL - Abnormal; Notable for the following:    Glucose, Bld 143 (*)     Calcium 11.4 (*)     Albumin 3.4 (*)     Alkaline Phosphatase 127 (*)     Total Bilirubin 0.2 (*)     GFR calc non  Af Amer 63 (*)     GFR calc Af Amer 73 (*)     All other components within normal limits  PROTIME-INR  APTT  TROPONIN I  URINALYSIS, ROUTINE W REFLEX MICROSCOPIC  TROPONIN I  TROPONIN I   Dg Chest Port 1 View  05/01/2012  *RADIOLOGY REPORT*  Clinical  Data: Chest pain and shortness of breath.  PORTABLE CHEST - 1 VIEW  Comparison: None.  Findings: Lungs are clear.  Heart size is upper normal.  No pneumothorax or pleural fluid.  IMPRESSION: No acute disease.   Original Report Authenticated By: Arvid Right. Luther Parody, M.D.      Date: 05/01/2012  Rate: 52  Rhythm: sinus bradycardia  QRS Axis: normal  Intervals: normal  ST/T Wave abnormalities: nonspecific T wave changes  Conduction Disutrbances:none  Narrative Interpretation:   Old EKG Reviewed: unchanged    1. Unstable angina       MDM   Pt with symptoms concerning for ACS.  TIMI 4. Associated symptoms include SOB, N and diaphoresis.  ASA and NTG taken at home and after 4 NTG chest pain finally resolved in about 1 hour. EKG unchanged, CXR, CBC, BMP, CE, Coags pending.   3:32 PM Initial labs are normal.  Due to high risk will admit for r/o.      Blanchie Dessert, MD 05/01/12 [phone]

## 2012-05-01 NOTE — H&P (Signed)
History and Physical  Patient ID: Kristine Oliver MRN: [id number], SOB: 13-Aug-1930 78 y.o. Date of Encounter: 05/01/2012, 3:42 PM  Primary Physician: Irven Shelling, MD Primary Cardiologist: Dr. [name] Primary Electrophysiologist:  None   Chief Complaint:  Chest Pain  History of Present Illness: Kristine Oliver is a 78 y.o. female with a hx of CAD, s/p Taxus DES to the LAD in 2006 in setting of NSTEMI, diastolic CHF, DM2, HTN, HL, hypothyroidism.  Last LHC in 3/12 with patent stents and non-obstructive disease.  Last admitted in 0000000 with a/c diastolic CHF.  Over the last 2 months, she reports "angina attacks."  She describes these as chest pressure that radiates up into her neck as well as to her left shoulder. This is typically brought on by some type of activity. However, she does report the ability to walk without developing symptoms. Her symptoms have been progressively getting worse. She typically takes nitroglycerin with relief. Today, she developed chest symptoms at rest. Of note, this occurred shortly after eating. She does report a history of esophageal stricture. However, she has never required dilatation. She says that her symptoms today were not like previous acid reflux. She did have some shortness of breath. She denies syncope, diaphoresis or nausea. She took 4 nitroglycerin which was unusual for her. This prompted her to call EMS. In the emergency room, her chest x-ray is unremarkable. Initial troponin is negative. ECG is unchanged from prior.  She denies any recent significant weight gain. She denies any significant increase in pedal edema. She denies orthopnea or PND.   Past Medical History  Diagnosis Date  . CAD (coronary artery disease) 02/12/2012    a. NSTEMI 2006:  2.7 x 28 mm Taxus stent to LAD 2006;  b.  Cath 3/12 Dr. Marlou Porch  Normal left main, patent LAD stents with jailed septal, 30% circumflex, 30% RCA    . Type 2 diabetes mellitus with vascular disease   . HTN  (hypertension)   . Obesity (BMI 30-39.9)   . Chronic pain disorder   . Hypothyroidism   . Hyperlipidemia   . Lumbar disc disease   . Bipolar affective disorder   . Scoliosis   . Chronic diastolic heart failure     a. Echo 7/13:  mod LVH, EF 65-70%, vigorous LV, Gr 1 diast dysfn, mild LAE  . Renal artery stenosis     a. left RA 30-40% on LHC 3/12  . IBS (irritable bowel syndrome)   . GERD (gastroesophageal reflux disease)     Past Surgical History  Procedure Date  . Cholecystectomy   . Lumbar laminectomy   . Abdominal hysterectomy   . Dilation and curettage of uterus   . Pilonidal cyst excision   . Rotator cuff repair      No current facility-administered medications for this encounter.   Current Outpatient Prescriptions  Medication Sig Dispense Refill  . ALPRAZolam (XANAX) 0.25 MG tablet Take 0.25 mg by mouth 3 (three) times daily as needed. anxiety      . amLODipine (NORVASC) 5 MG tablet Take 5 mg by mouth daily.      Marland Kitchen aspirin 81 MG chewable tablet Chew 1 tablet (81 mg total) by mouth daily.  30 tablet  11  . clindamycin (CLEOCIN T) 1 % lotion Apply 1 application topically 2 (two) times daily. For face      . clopidogrel (PLAVIX) 75 MG tablet Take 75 mg by mouth daily.      Marland Kitchen estrogen-methylTESTOSTERone  0.625-1.25 MG per tablet Take 1 tablet by mouth daily.      Marland Kitchen gabapentin (NEURONTIN) 300 MG capsule Take 300 mg by mouth 2 (two) times daily.      Marland Kitchen levothyroxine (SYNTHROID, LEVOTHROID) 75 MCG tablet Take 75 mcg by mouth daily.      Marland Kitchen lidocaine (LIDODERM) 5 % Place 2 patches onto the skin daily. Remove & Discard patch within 12 hours or as directed by MD      . metFORMIN (GLUCOPHAGE) 1000 MG tablet Take 1,000 mg by mouth 2 (two) times daily with a meal.      . omeprazole (PRILOSEC) 20 MG capsule Take 20 mg by mouth daily.      . Oxycodone HCl 10 MG TABS Take 10 mg by mouth 4 (four) times daily.      . pravastatin (PRAVACHOL) 20 MG tablet Take 20 mg by mouth daily.      .  ramipril (ALTACE) 5 MG tablet Take 5 mg by mouth daily.      . valACYclovir (VALTREX) 500 MG tablet Take 500 mg by mouth 2 (two) times daily.      Marland Kitchen zolpidem (AMBIEN) 10 MG tablet Take 10 mg by mouth at bedtime.        Allergies: Allergies  Allergen Reactions  . Prednisone Nausea And Vomiting    History  Substance Use Topics  . Smoking status: Former Research scientist (life sciences)  . Smokeless tobacco: Not on file  . Alcohol Use: No     social drinking, not often     Family History  Problem Relation Age of Onset  . Coronary artery disease Mother   . Heart failure Father   . Diabetes Father     ROS:  Please see the history of present illness.   No fevers, chills, cough, melena, hematochezia, vomiting, diarrhea.  All other systems reviewed and negative.   Vital Signs: Blood pressure 139/104, pulse 53, temperature 98.9 F (37.2 C), temperature source Oral, resp. rate 20, SpO2 100.00%.  PHYSICAL EXAM: General:  Well nourished, well developed, in no acute distress HEENT: normal Lymph: no adenopathy Neck: no JVD Endocrine:  No thryomegaly Vascular: No carotid bruits; DP/PT 1+ bilaterally Cardiac:  normal S1, S2; RRR; 1/6 systolic murmur at the RUSB Lungs:  clear to auscultation bilaterally, no wheezing, rhonchi or rales Abd: soft, nontender, no hepatomegaly Ext: no edema Musculoskeletal:  No deformities Skin: warm and dry Neuro:  CNs 2-12 intact, no focal abnormalities noted Psych:  Normal affect   EKG:  Sinus bradycardia, heart rate 52, left axis deviation, first degree AV block, T-wave inversions in 1 and aVL, no change from prior tracing  Labs:   Lab Results  Component Value Date   WBC 8.4 05/01/2012   HGB 13.0 05/01/2012   HCT 38.6 05/01/2012   MCV 95.1 05/01/2012   PLT 219 05/01/2012     Lab 05/01/12 1343  NA 135  K 4.5  CL 104  CO2 21  BUN 16  CREATININE 0.86  CALCIUM 11.4*  PROT 6.6  BILITOT 0.2*  ALKPHOS 127*  ALT 21  AST 23  GLUCOSE 143*    Basename 05/01/12 1351    CKTOTAL --  CKMB --  TROPONINI <0.30    Radiology/Studies:  Dg Chest Port 1 View  05/01/2012:  IMPRESSION: No acute disease.   Original Report Authenticated By: Arvid Right. D'ALESSIO, M.D.      ASSESSMENT AND PLAN:   1.  Chest Pain: Typical and atypical features.  She states  this is her typical angina and it was her symptom prior to her previous stent. However, she had a recent cardiac catheterization demonstrated patent stents and nonobstructive disease elsewhere. She will be admitted to the hospital. Check serial cardiac markers. She will be kept n.p.o. after midnight. Her primary cardiologist will see her tomorrow to decide inpatient stress testing versus cardiac catheterization. Start IV heparin if enzymes positive.  DVT Lovenox for now. No beta blocker due to bradycardia. When necessary nitroglycerin. Continue aspirin and Plavix.  **Will make patient n.p.o. after midnight tonight and place order for ETT-Myoview for tomorrow. Primary cardiologist can decide on rounds tomorrow whether or not to proceed or will cancel this and proceed with cardiac catheterization.**  2. Coronary Artery Disease: Continue aspirin, Plavix and statin. Check still cardiac markers as noted. Primary cardiologist will see her tomorrow to decide on further evaluation.  3. Diabetes Mellitus: Hold metformin tonight in case she does require cardiac catheterization. Cover with sliding-scale.  4. Hypertension: Blood pressure is elevated. Increase Norvasc to 10 mg.  5. Hyperlipidemia: Continue statin.  6. Chronic Diastolic CHF: No obvious volume overload. Chest x-ray is clear.   Danton Sewer, PA-C  3:46 PM 05/01/2012

## 2012-05-01 NOTE — ED Notes (Signed)
Per EMS: Pressure started at breakfast. Took 4 NTG and 325mg  ASA with pain relief. Initially cp 8/10, after ntg and asa 0/10. Slightly short of breath but no distress. Center of chest with radiation to left arm. Hx cardiac problems, htn, dm. 1 degree heart block. Slightly bradycardic - rate 52. PIV L forearm 20g.

## 2012-05-02 ENCOUNTER — Observation Stay (HOSPITAL_COMMUNITY): Payer: Medicare Other

## 2012-05-02 DIAGNOSIS — R079 Chest pain, unspecified: Secondary | ICD-10-CM | POA: Diagnosis present

## 2012-05-02 LAB — LIPID PANEL
Cholesterol: 155 mg/dL (ref 0–200)
Triglycerides: 233 mg/dL — ABNORMAL HIGH (ref <150)
VLDL: 47 mg/dL — ABNORMAL HIGH (ref 0–40)

## 2012-05-02 LAB — TROPONIN I: Troponin I: <0.3 ng/mL (ref <0.30)

## 2012-05-02 LAB — TSH: TSH: 0.451 u[IU]/mL (ref 0.350–4.500)

## 2012-05-02 LAB — GLUCOSE, CAPILLARY

## 2012-05-02 LAB — BASIC METABOLIC PANEL
BUN: 16 mg/dL (ref 6–23)
Calcium: 11.2 mg/dL — ABNORMAL HIGH (ref 8.4–10.5)
Creatinine, Ser: 1.09 mg/dL (ref 0.50–1.10)
GFR calc Af Amer: 54 mL/min — ABNORMAL LOW (ref >90)

## 2012-05-02 MED ORDER — TECHNETIUM TC 99M SESTAMIBI GENERIC - CARDIOLITE: 10.0000 | Freq: Once | INTRAVENOUS | Status: DC | PRN

## 2012-05-02 MED ORDER — TECHNETIUM TC 99M SESTAMIBI GENERIC - CARDIOLITE
30.0000 | Freq: Once | INTRAVENOUS | Status: AC | PRN
  Administered 2012-05-02: 30 via INTRAVENOUS

## 2012-05-02 MED ORDER — NITROGLYCERIN 0.4 MG SL SUBL: 0.4000 mg | SUBLINGUAL_TABLET | SUBLINGUAL | Status: DC | PRN

## 2012-05-02 MED ORDER — TECHNETIUM TC 99M SESTAMIBI GENERIC - CARDIOLITE
10.0000 | Freq: Once | INTRAVENOUS | Status: AC | PRN
  Administered 2012-05-02: 10 via INTRAVENOUS

## 2012-05-02 NOTE — Progress Notes (Signed)
DC orders received.  Patient stable with no S/S of distress. Discharge medication and information reviewed with patient and patient's nephew.  Patient DC home. Lina Sar

## 2012-05-02 NOTE — H&P (Signed)
Patient examined chart reviewed. Care plan discussed with patient and husband.  Somewhat atypical symptoms with cath 3/12 patent LAD stent and no disease in RCA or circumflex.  Recent admission for diastolic dysfunction but no dyspnea and CXR clear now.  R/O Heparin only for recurrent pain.  Tentatively schedule myovue in a.m. If R/O and Dr Marlou Porch can decide in am if he would like to do this or cath.  Patient would like to try to walk if myovue done as she did not like "chemical" in past  Jenkins Rouge 05/01/12

## 2012-05-02 NOTE — Discharge Summary (Signed)
Patient ID: Kristine Oliver MRN: [id number] DOB/AGE: 76-15-1932 76 y.o.  Admit date: 05/01/2012 Discharge date: 05/02/2012  Primary Discharge Diagnosis: Atypical chest pain Secondary Discharge Diagnosis: Prior diastolic heart failure, obesity, hypertension, coronary artery disease-Catheterization 3/12 with patent LAD stent.  Significant Diagnostic Studies: Nuclear stress test-low risk no ischemia identified, Suggestion of minimal inferior wall hypokinesis. Breast attenuation noted. EF calculated 47%. Likely underestimation.   Hospital Course:   78 year old female with coronary artery disease status post DES to LAD in 2006, Taxus, in the setting of non-STEMI with diabetes, hypertension, hyperlipidemia, diastolic heart failure who had cardiac catheterization in 3/12 that demonstrated patent stentWith otherwise nonobstructive disease. She's had episodes of chest discomfort in the past and once again she states that she is having "angina attacks ". She describes a chest pressure that radiates up to her neck as well as left shoulder. Sometimes these occur at rest but sometimes they occur with activity. She reports the ability to walk without developing symptoms. She does take nitroglycerin at home.She does note that she is prior esophageal stricture but has never required dilatation. Troponins were normal. EKG is unchanged. Blood pressure is mildly elevated. She underwent nuclear stress test which overall showed no evidence of ischemia. Overall low risk. Reassuring. On the morning of discharge she was not complaining of any chest discomfort. She felt well. We discussed other possible etiologies for chest pain including musculoskeletal. Continue with aggressive risk factor modification. Discharge Exam: Blood pressure 125/71, pulse 55, temperature 98 F (36.7 C), temperature source Oral, resp. rate 18, height 5' (1.524 m), weight 89.495 kg (197 lb 4.8 oz), SpO2 96.00%.    General: Alert and oriented x3 in  no acute distress Cardiovascular: Regular rate and rhythm, Soft systolic murmur right upper sternal border Lungs: Clear Abdomen: Obese, soft, nontender  Labs:   Lab Results  Component Value Date   WBC 8.4 05/01/2012   HGB 13.3 05/01/2012   HCT 40.5 05/01/2012   MCV 95.3 05/01/2012   PLT 228 05/01/2012    Lab 05/02/12 0635 05/01/12 1343  NA 138 --  K 4.4 --  CL 107 --  CO2 22 --  BUN 16 --  CREATININE 1.09 --  CALCIUM 11.2* --  PROT -- 6.6  BILITOT -- 0.2*  ALKPHOS -- 127*  ALT -- 21  AST -- 23  GLUCOSE 106* --   Lab Results  Component Value Date   CKTOTAL 87 02/13/2012   CKMB 4.2* 02/13/2012   TROPONINI <0.30 05/02/2012    Lab Results  Component Value Date   CHOL 155 05/02/2012   CHOL  Value: 137        ATP III CLASSIFICATION:  <200     mg/dL   Desirable  200-239  mg/dL   Borderline High  >=240    mg/dL   High        10/26/2010   CHOL  Value: 115        ATP III CLASSIFICATION:  <200     mg/dL   Desirable  200-239  mg/dL   Borderline High  >=240    mg/dL   High 02/19/2008   Lab Results  Component Value Date   HDL 28* 05/02/2012   HDL 38* 10/26/2010   HDL 25* 02/19/2008   Lab Results  Component Value Date   LDLCALC 80 05/02/2012   LDLCALC  Value: 65        Total Cholesterol/HDL:CHD Risk Coronary Heart Disease Risk Table  Men   Women  1/2 Average Risk   3.4   3.3  Average Risk       5.0   4.4  2 X Average Risk   9.6   7.1  3 X Average Risk  23.4   11.0        Use the calculated Patient Ratio above and the CHD Risk Table to determine the patient's CHD Risk.        ATP III CLASSIFICATION (LDL):  <100     mg/dL   Optimal  100-129  mg/dL   Near or Above                    Optimal  130-159  mg/dL   Borderline  160-189  mg/dL   High  >190     mg/dL   Very High 10/26/2010   LDLCALC  Value: 60        Total Cholesterol/HDL:CHD Risk Coronary Heart Disease Risk Table                     Men   Women  1/2 Average Risk   3.4   3.3 02/19/2008   Lab Results  Component Value Date    TRIG 233* 05/02/2012   TRIG 170* 10/26/2010   TRIG 151* 02/19/2008   Lab Results  Component Value Date   CHOLHDL 5.5 05/02/2012   CHOLHDL 3.6 10/26/2010   CHOLHDL 4.6 02/19/2008   No results found for this basename: LDLDIRECT      Radiology: Chest x-ray unremarkable [id number] bradycardia rate 52 with left axis deviation, first degree AV block, T wave inversion one and aVL, no change from prior  [place] Discharge Orders    Future Orders Please Complete By Expires   Diet - low sodium heart healthy      Increase activity slowly          Medication List     As of 05/02/2012  4:51 PM    STOP taking these medications         omeprazole 20 MG capsule   Commonly known as: PRILOSEC      TAKE these medications         ALPRAZolam 0.25 MG tablet   Commonly known as: XANAX   Take 0.25 mg by mouth 3 (three) times daily as needed. anxiety      amLODipine 5 MG tablet   Commonly known as: NORVASC   Take 5 mg by mouth daily.      aspirin 81 MG chewable tablet   Chew 1 tablet (81 mg total) by mouth daily.      clindamycin 1 % lotion   Commonly known as: CLEOCIN T   Apply 1 application topically 2 (two) times daily. For face      clopidogrel 75 MG tablet   Commonly known as: PLAVIX   Take 75 mg by mouth daily.      estrogen-methylTESTOSTERone 0.625-1.25 MG per tablet   Take 1 tablet by mouth daily.      gabapentin 300 MG capsule   Commonly known as: NEURONTIN   Take 300 mg by mouth 2 (two) times daily.      levothyroxine 75 MCG tablet   Commonly known as: SYNTHROID, LEVOTHROID   Take 75 mcg by mouth daily.      lidocaine 5 %   Commonly known as: LIDODERM   Place 2 patches onto the skin daily. Remove & Discard patch  within 12 hours or as directed by MD      metFORMIN 1000 MG tablet   Commonly known as: GLUCOPHAGE   Take 1,000 mg by mouth 2 (two) times daily with a meal.      nitroGLYCERIN 0.4 MG SL tablet   Commonly known as: NITROSTAT    Place 1 tablet (0.4 mg total) under the tongue every 5 (five) minutes x 3 doses as needed for chest pain.      Oxycodone HCl 10 MG Tabs   Take 10 mg by mouth 4 (four) times daily.      pravastatin 20 MG tablet   Commonly known as: PRAVACHOL   Take 20 mg by mouth daily.      ramipril 5 MG tablet   Commonly known as: ALTACE   Take 5 mg by mouth daily.      valACYclovir 500 MG tablet   Commonly known as: VALTREX   Take 500 mg by mouth 2 (two) times daily.      zolpidem 10 MG tablet   Commonly known as: AMBIEN   Take 10 mg by mouth at bedtime.           Follow-up Information    Follow up with FERGUSON,CYNTHIA A, NP. On 05/16/2012. (10:30am)    Contact information:   [facility] [place], [place] [address] [phone]        I also encouraged her to schedule a visit with Dr. Laurann Montana.I discussed the findings of her nuclear stress test via telephone. I examined her earlier this morning.  BRING ALL MEDICATIONS WITH YOU TO FOLLOW UP APPOINTMENTS  Time spent with patient to include physician time: 101min SignedMarlou Porch, [place] 05/02/2012, 4:51 PM

## 2012-05-02 NOTE — Progress Notes (Signed)
Subjective:  No more CP, no SOB. Laying comfortable.   Objective:  Vital Signs in the last 24 hours: Temp:  [97.5 F (36.4 C)-98.9 F (37.2 C)] 97.7 F (36.5 C) (09/30 0500) Pulse Rate:  [52-59] 53  (09/30 0500) Resp:  [12-20] 20  (09/30 0500) BP: (133-187)/(51-104) 133/74 mmHg (09/30 0500) SpO2:  [97 %-100 %] 99 % (09/30 0500) Weight:  [89.495 kg (197 lb 4.8 oz)] 89.495 kg (197 lb 4.8 oz) (09/29 1811)  Intake/Output from previous day: 09/29 0701 - 09/30 0700 In: -  Out: 1600 [Urine:1600]   Physical Exam: General: Well developed, well nourished, in no acute distress. Head:  Normocephalic and atraumatic. Lungs: Clear to auscultation and percussion. Heart: Normal S1 and S2.  No murmur, rubs or gallops.  Abdomen: soft, non-tender, positive bowel sounds. Obese Extremities: No clubbing or cyanosis. No edema. Neurologic: Alert and oriented x 3.    Lab Results:  [facility] 05/01/12 2112 05/01/12 1343  WBC 8.4 8.4  HGB 13.3 13.0  PLT 228 219    Basename 05/02/12 0635 05/01/12 2112 05/01/12 1343  NA 138 -- 135  K 4.4 -- 4.5  CL 107 -- 104  CO2 22 -- 21  GLUCOSE 106* -- 143*  BUN 16 -- 16  CREATININE 1.09 1.02 --    Basename 05/02/12 0635 05/01/12 2112  TROPONINI <0.30 <0.30   Hepatic Function Panel  Basename 05/01/12 1343  PROT 6.6  ALBUMIN 3.4*  AST 23  ALT 21  ALKPHOS 127*  BILITOT 0.2*  BILIDIR --  IBILI --    Basename 05/02/12 0635  CHOL 155   No results found for this basename: PROTIME in the last 72 hours  Imaging: Dg Chest Port 1 View  05/01/2012  *RADIOLOGY REPORT*  Clinical Data: Chest pain and shortness of breath.  PORTABLE CHEST - 1 VIEW  Comparison: None.  Findings: Lungs are clear.  Heart size is upper normal.  No pneumothorax or pleural fluid.  IMPRESSION: No acute disease.   Original Report Authenticated By: Arvid Right. Luther Parody, M.D.    Personally viewed.   Telemetry: No adverse rhythm Personally viewed.    Assessment/Plan:   78  year old with CAD, history of diastolic HF, obese, atypical CP.   -NUC today. Wants to walk. Has knee brace.  - If NUC low risk can go home later today.  - HTN control. Dr. Laurann Montana is PCP.  - CATH 3/12 - LAD stent patent.      Cristina Mattern, [place] 05/02/2012, 8:56 AM

## 2012-05-03 NOTE — Progress Notes (Signed)
Utilization review complete 

## 2013-04-30 ENCOUNTER — Encounter: Payer: Self-pay | Admitting: Cardiology

## 2013-05-04 ENCOUNTER — Encounter: Payer: Self-pay | Admitting: Cardiology

## 2013-05-08 ENCOUNTER — Ambulatory Visit: Payer: Medicare Other | Admitting: Cardiology

## 2013-06-01 ENCOUNTER — Encounter: Payer: Self-pay | Admitting: Cardiology

## 2013-06-01 ENCOUNTER — Ambulatory Visit (INDEPENDENT_AMBULATORY_CARE_PROVIDER_SITE_OTHER): Payer: 59 | Admitting: Cardiology

## 2013-06-01 VITALS — BP 136/82 | HR 70 | Ht 59.5 in | Wt 191.0 lb

## 2013-06-01 DIAGNOSIS — E669 Obesity, unspecified: Secondary | ICD-10-CM

## 2013-06-01 DIAGNOSIS — I798 Other disorders of arteries, arterioles and capillaries in diseases classified elsewhere: Secondary | ICD-10-CM

## 2013-06-01 DIAGNOSIS — E1159 Type 2 diabetes mellitus with other circulatory complications: Secondary | ICD-10-CM

## 2013-06-01 DIAGNOSIS — I119 Hypertensive heart disease without heart failure: Secondary | ICD-10-CM

## 2013-06-01 DIAGNOSIS — I2510 Atherosclerotic heart disease of native coronary artery without angina pectoris: Secondary | ICD-10-CM

## 2013-06-01 NOTE — Progress Notes (Signed)
[facility]. [address], [address] [place], [place]  [address] Phone: [phone] Fax:  [phone]  Date:  06/01/2013   ID:  Kristine, Oliver Apr 13, 1931, MRN [id number]  PCP:  Irven Shelling, MD   History of Present Illness: Kristine Oliver is a 79 y.o. female with diastolic heart failure, normal ejection fraction, grade 1 diastolic dysfunction, coronary artery disease here for followup. Occasionally she will feel lightheaded upon standing. Creatinine of 1.33 with elevated BUN previously. Because of this, her Lasix was discontinued. She was instructed to follow a low-sodium diet. She is very interesting. 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 Continues compliant with her medications.  06/01/13- Felt bad past week after [facility]. Felt bad. Stopped some of her meds. Glucose she thinks went sky high. Had trouble walking. Did not gain a lot of weight she states.    Wt Readings from Last 3 Encounters:  06/01/13 191 lb (86.637 kg)  05/01/12 197 lb 4.8 oz (89.495 kg)  02/15/12 199 lb 11.8 oz (90.6 kg)     Past Medical History  Diagnosis Date  . CAD (coronary artery disease) 02/12/2012    a. NSTEMI 2006:  2.7 x 28 mm Taxus stent to LAD 2006;  b.  Cath 3/12 Dr. Marlou Porch  Normal left main, patent LAD stents with jailed septal, 30% circumflex, 30% RCA    . HTN (hypertension)   . Obesity (BMI 30-39.9)   . Chronic pain disorder   . Hyperthyroidism   . Hyperlipidemia   . Lumbar disc disease   . Bipolar affective disorder   . Scoliosis   . Chronic diastolic heart failure     a. Echo 7/13:  mod LVH, EF 65-70%, vigorous LV, Gr 1 diast dysfn, mild LAE  . IBS (irritable bowel syndrome)   . GERD (gastroesophageal reflux disease)   . Diabetes mellitus     , Type II  . Peripheral neuropathy     , Associated with DM  . Nonproliferative diabetic retinopathy associated with type 2 diabetes mellitus   . DDD (degenerative disc disease),  lumbosacral     , Spinal stenosis  . Non Q wave myocardial infarction 2005    Past Surgical History  Procedure Laterality Date  . Cholecystectomy    . Laminotomy Right   . Dilation and curettage of uterus    . Pilonidal cyst excision    . Rotator cuff repair    . Back surgery    . Cardiac catheterization    . Tonsillectomy    . Induced abortion      , x2  . Total abdominal hysterectomy w/ bilateral salpingoophorectomy    . Lumbar disc surgery  07/1999    L5-S1  . Hemilaminotomy lumbar spine  2002    Rt L5, with decompression and foraminotomy    Current Outpatient Prescriptions  Medication Sig Dispense Refill  . ALPRAZolam (XANAX) 0.25 MG tablet Take 0.25 mg by mouth 3 (three) times daily as needed. anxiety      . amLODipine (NORVASC) 5 MG tablet Take 5 mg by mouth daily.      Marland Kitchen aspirin 81 MG tablet Take 81 mg by mouth daily.      . clindamycin (CLEOCIN T) 1 % lotion Apply 1 application topically 2 (two) times daily. For face      . clopidogrel (PLAVIX) 75 MG tablet Take 75 mg by mouth daily.      Marland Kitchen  estrogen-methylTESTOSTERone 0.625-1.25 MG per tablet Take 1 tablet by mouth daily.      Marland Kitchen gabapentin (NEURONTIN) 300 MG capsule Take 300 mg by mouth 2 (two) times daily.      Marland Kitchen levothyroxine (SYNTHROID, LEVOTHROID) 75 MCG tablet Take 75 mcg by mouth daily.      Marland Kitchen lidocaine (LIDODERM) 5 % Place 2 patches onto the skin daily. Remove & Discard patch within 12 hours or as directed by MD      . metFORMIN (GLUCOPHAGE) 1000 MG tablet Take 1,000 mg by mouth 2 (two) times daily with a meal.      . nitroGLYCERIN (NITROSTAT) 0.4 MG SL tablet Place 1 tablet (0.4 mg total) under the tongue every 5 (five) minutes x 3 doses as needed for chest pain.  30 tablet  12  . Oxycodone HCl 10 MG TABS Take 10 mg by mouth 4 (four) times daily.      . ramipril (ALTACE) 5 MG tablet Take 5 mg by mouth daily.      . valACYclovir (VALTREX) 500 MG tablet Take 500 mg by mouth 2 (two) times daily.      Marland Kitchen zolpidem  (AMBIEN) 10 MG tablet Take 10 mg by mouth at bedtime.       No current facility-administered medications for this visit.    Allergies:    Allergies  Allergen Reactions  . Pravastatin Other (See Comments)    Muscle aches  . Prednisone Nausea And Vomiting    Social History:  The patient  reports that she has quit smoking. She does not have any smokeless tobacco history on file. She reports that she does not drink alcohol or use illicit drugs.   ROS:  Please see the history of present illness.   No syncope, no bleeding, no CP, no SOB.     PHYSICAL EXAM: VS:  BP 136/82  Pulse 70  Ht 4' 11.5" (1.511 m)  Wt 191 lb (86.637 kg)  BMI 37.95 kg/m2 Well nourished, well developed, in no acute distress HEENT: normal Neck: no JVD Cardiac:  normal S1, S2; RRR; no murmur Lungs:  clear to auscultation bilaterally, no wheezing, rhonchi or rales Abd: soft, nontender, no hepatomegalyObese.  Ext: no edema Skin: warm and dry Neuro: no focal abnormalities noted  LDL 126 - 04/2012  ASSESSMENT AND PLAN:  1. Diastolic heart failure-likely had a mild exacerbation after her overindulgence at [employer]. We discussed watching fluids, salt intake. No changes currently in medications. She is feeling better. Blood pressure is under control currently. 2. Obesity-encourage weight loss, exercise. 3. Diabetes-encouraged watching sugars closely. Dr. Laurann Montana his monitoring as well. 4. Diabetes with renal manifestations-creatinine mildly elevated at 1.3. 5. CAD-LAD stent 2009. Currently no angina. 6. Chronic kidney disease stage III-creatinine 1.3. Diabetes. 7. Signed, [name], MD [facility]  06/01/2013 11:45 AM

## 2013-06-01 NOTE — Patient Instructions (Signed)
Your physician recommends that you continue on your current medications as directed. Please refer to the Current Medication list given to you today.  Your physician wants you to follow-up in: 6 months with Dr. Skains. You will receive a reminder letter in the mail two months in advance. If you don't receive a letter, please call our office to schedule the follow-up appointment.  

## 2013-06-25 ENCOUNTER — Emergency Department (HOSPITAL_COMMUNITY): Payer: Medicare Other

## 2013-06-25 ENCOUNTER — Encounter (HOSPITAL_COMMUNITY): Payer: Self-pay | Admitting: Emergency Medicine

## 2013-06-25 ENCOUNTER — Observation Stay (HOSPITAL_COMMUNITY)
Admission: EM | Admit: 2013-06-25 | Discharge: 2013-06-27 | Disposition: A | Discharge status: Home / Self Care | Payer: Medicare Other | Attending: Internal Medicine | Admitting: Internal Medicine

## 2013-06-25 DIAGNOSIS — G894 Chronic pain syndrome: Secondary | ICD-10-CM | POA: Insufficient documentation

## 2013-06-25 DIAGNOSIS — I252 Old myocardial infarction: Secondary | ICD-10-CM | POA: Insufficient documentation

## 2013-06-25 DIAGNOSIS — E1139 Type 2 diabetes mellitus with other diabetic ophthalmic complication: Secondary | ICD-10-CM | POA: Insufficient documentation

## 2013-06-25 DIAGNOSIS — E785 Hyperlipidemia, unspecified: Secondary | ICD-10-CM | POA: Insufficient documentation

## 2013-06-25 DIAGNOSIS — I2510 Atherosclerotic heart disease of native coronary artery without angina pectoris: Secondary | ICD-10-CM | POA: Diagnosis present

## 2013-06-25 DIAGNOSIS — K219 Gastro-esophageal reflux disease without esophagitis: Secondary | ICD-10-CM | POA: Insufficient documentation

## 2013-06-25 DIAGNOSIS — E11329 Type 2 diabetes mellitus with mild nonproliferative diabetic retinopathy without macular edema: Secondary | ICD-10-CM | POA: Insufficient documentation

## 2013-06-25 DIAGNOSIS — I10 Essential (primary) hypertension: Secondary | ICD-10-CM | POA: Insufficient documentation

## 2013-06-25 DIAGNOSIS — Z87891 Personal history of nicotine dependence: Secondary | ICD-10-CM | POA: Insufficient documentation

## 2013-06-25 DIAGNOSIS — E669 Obesity, unspecified: Secondary | ICD-10-CM | POA: Insufficient documentation

## 2013-06-25 DIAGNOSIS — I5031 Acute diastolic (congestive) heart failure: Secondary | ICD-10-CM

## 2013-06-25 DIAGNOSIS — R079 Chest pain, unspecified: Principal | ICD-10-CM | POA: Insufficient documentation

## 2013-06-25 DIAGNOSIS — Z79899 Other long term (current) drug therapy: Secondary | ICD-10-CM | POA: Insufficient documentation

## 2013-06-25 DIAGNOSIS — I5033 Acute on chronic diastolic (congestive) heart failure: Secondary | ICD-10-CM

## 2013-06-25 DIAGNOSIS — M4120 Other idiopathic scoliosis, site unspecified: Secondary | ICD-10-CM | POA: Insufficient documentation

## 2013-06-25 DIAGNOSIS — G609 Hereditary and idiopathic neuropathy, unspecified: Secondary | ICD-10-CM | POA: Insufficient documentation

## 2013-06-25 DIAGNOSIS — M51379 Other intervertebral disc degeneration, lumbosacral region without mention of lumbar back pain or lower extremity pain: Secondary | ICD-10-CM | POA: Insufficient documentation

## 2013-06-25 DIAGNOSIS — M519 Unspecified thoracic, thoracolumbar and lumbosacral intervertebral disc disorder: Secondary | ICD-10-CM | POA: Insufficient documentation

## 2013-06-25 DIAGNOSIS — K589 Irritable bowel syndrome without diarrhea: Secondary | ICD-10-CM | POA: Insufficient documentation

## 2013-06-25 DIAGNOSIS — F319 Bipolar disorder, unspecified: Secondary | ICD-10-CM | POA: Insufficient documentation

## 2013-06-25 DIAGNOSIS — Z7982 Long term (current) use of aspirin: Secondary | ICD-10-CM | POA: Insufficient documentation

## 2013-06-25 DIAGNOSIS — E0590 Thyrotoxicosis, unspecified without thyrotoxic crisis or storm: Secondary | ICD-10-CM | POA: Insufficient documentation

## 2013-06-25 DIAGNOSIS — M5137 Other intervertebral disc degeneration, lumbosacral region: Secondary | ICD-10-CM | POA: Insufficient documentation

## 2013-06-25 DIAGNOSIS — I5032 Chronic diastolic (congestive) heart failure: Secondary | ICD-10-CM | POA: Insufficient documentation

## 2013-06-25 DIAGNOSIS — I798 Other disorders of arteries, arterioles and capillaries in diseases classified elsewhere: Secondary | ICD-10-CM

## 2013-06-25 DIAGNOSIS — E1159 Type 2 diabetes mellitus with other circulatory complications: Secondary | ICD-10-CM

## 2013-06-25 DIAGNOSIS — E1149 Type 2 diabetes mellitus with other diabetic neurological complication: Secondary | ICD-10-CM | POA: Insufficient documentation

## 2013-06-25 LAB — CBC WITH DIFFERENTIAL/PLATELET
Eosinophils Relative: 6 % — ABNORMAL HIGH (ref 0–5)
HCT: 36.2 % (ref 36.0–46.0)
Lymphocytes Relative: 22 % (ref 12–46)
Lymphs Abs: 1.7 10*3/uL (ref 0.7–4.0)
MCV: 95 fL (ref 78.0–100.0)
Monocytes Absolute: 0.5 10*3/uL (ref 0.1–1.0)
Monocytes Relative: 7 % (ref 3–12)
RBC: 3.81 MIL/uL — ABNORMAL LOW (ref 3.87–5.11)
RDW: 14.4 % (ref 11.5–15.5)
WBC: 7.7 10*3/uL (ref 4.0–10.5)

## 2013-06-25 LAB — COMPREHENSIVE METABOLIC PANEL
BUN: 18 mg/dL (ref 6–23)
CO2: 22 mEq/L (ref 19–32)
Calcium: 10.7 mg/dL — ABNORMAL HIGH (ref 8.4–10.5)
Creatinine, Ser: 1.1 mg/dL (ref 0.50–1.10)
GFR calc Af Amer: 53 mL/min — ABNORMAL LOW (ref >90)
GFR calc non Af Amer: 46 mL/min — ABNORMAL LOW (ref >90)
Glucose, Bld: 124 mg/dL — ABNORMAL HIGH (ref 70–99)

## 2013-06-25 LAB — POCT I-STAT TROPONIN I: Troponin i, poc: 0 ng/mL (ref 0.00–0.08)

## 2013-06-25 MED ORDER — ASPIRIN 81 MG PO TABS: 81.0000 mg | ORAL_TABLET | Freq: Every day | ORAL | Status: DC

## 2013-06-25 MED ORDER — RAMIPRIL 5 MG PO CAPS
5.0000 mg | ORAL_CAPSULE | Freq: Every day | ORAL | Status: DC
  Administered 2013-06-26: 5 mg via ORAL
  Filled 2013-06-25 (×2): qty 1

## 2013-06-25 MED ORDER — ENOXAPARIN SODIUM 40 MG/0.4ML ~~LOC~~ SOLN
40.0000 mg | SUBCUTANEOUS | Status: DC
  Administered 2013-06-25 – 2013-06-26 (×2): 40 mg via SUBCUTANEOUS
  Filled 2013-06-25 (×3): qty 0.4

## 2013-06-25 MED ORDER — ONDANSETRON HCL 4 MG/2ML IJ SOLN: 4.0000 mg | Freq: Four times a day (QID) | INTRAMUSCULAR | Status: DC | PRN

## 2013-06-25 MED ORDER — LEVOTHYROXINE SODIUM 75 MCG PO TABS
75.0000 ug | ORAL_TABLET | Freq: Every day | ORAL | Status: DC
  Administered 2013-06-26 – 2013-06-27 (×2): 75 ug via ORAL
  Filled 2013-06-25 (×3): qty 1

## 2013-06-25 MED ORDER — ASPIRIN EC 81 MG PO TBEC
81.0000 mg | DELAYED_RELEASE_TABLET | Freq: Every day | ORAL | Status: DC
  Administered 2013-06-26 – 2013-06-27 (×2): 81 mg via ORAL
  Filled 2013-06-25 (×2): qty 1

## 2013-06-25 MED ORDER — NITROGLYCERIN 0.4 MG SL SUBL: 0.4000 mg | SUBLINGUAL_TABLET | SUBLINGUAL | Status: DC | PRN

## 2013-06-25 MED ORDER — INSULIN ASPART 100 UNIT/ML ~~LOC~~ SOLN: 0.0000 [IU] | Freq: Three times a day (TID) | SUBCUTANEOUS | Status: DC

## 2013-06-25 MED ORDER — ALPRAZOLAM 0.25 MG PO TABS
0.2500 mg | ORAL_TABLET | Freq: Three times a day (TID) | ORAL | Status: DC | PRN
  Administered 2013-06-26: 0.125 mg via ORAL
  Filled 2013-06-25: qty 1

## 2013-06-25 MED ORDER — ZOLPIDEM TARTRATE 5 MG PO TABS
5.0000 mg | ORAL_TABLET | Freq: Every day | ORAL | Status: DC
  Administered 2013-06-26 (×2): 5 mg via ORAL
  Filled 2013-06-25 (×2): qty 1

## 2013-06-25 MED ORDER — CLOPIDOGREL BISULFATE 75 MG PO TABS
75.0000 mg | ORAL_TABLET | Freq: Every day | ORAL | Status: DC
  Administered 2013-06-26 – 2013-06-27 (×2): 75 mg via ORAL
  Filled 2013-06-25 (×2): qty 1

## 2013-06-25 MED ORDER — VALACYCLOVIR HCL 500 MG PO TABS
1000.0000 mg | ORAL_TABLET | Freq: Every day | ORAL | Status: DC
  Administered 2013-06-26 – 2013-06-27 (×2): 1000 mg via ORAL
  Filled 2013-06-25 (×2): qty 2

## 2013-06-25 MED ORDER — ASPIRIN 325 MG PO TABS: 325.0000 mg | ORAL_TABLET | Freq: Once | ORAL | Status: DC

## 2013-06-25 MED ORDER — OXYCODONE HCL 5 MG PO TABS
10.0000 mg | ORAL_TABLET | Freq: Four times a day (QID) | ORAL | Status: DC | PRN
  Administered 2013-06-25 – 2013-06-27 (×5): 10 mg via ORAL
  Filled 2013-06-25 (×5): qty 2

## 2013-06-25 MED ORDER — ACETAMINOPHEN 325 MG PO TABS: 650.0000 mg | ORAL_TABLET | ORAL | Status: DC | PRN

## 2013-06-25 MED ORDER — AMLODIPINE BESYLATE 5 MG PO TABS
5.0000 mg | ORAL_TABLET | Freq: Every day | ORAL | Status: DC
  Administered 2013-06-26: 5 mg via ORAL
  Filled 2013-06-25 (×2): qty 1

## 2013-06-25 MED ORDER — INSULIN ASPART 100 UNIT/ML ~~LOC~~ SOLN: 0.0000 [IU] | Freq: Every day | SUBCUTANEOUS | Status: DC

## 2013-06-25 MED ORDER — RAMIPRIL 5 MG PO TABS: 5.0000 mg | ORAL_TABLET | Freq: Every day | ORAL | Status: DC

## 2013-06-25 MED ORDER — GABAPENTIN 300 MG PO CAPS
300.0000 mg | ORAL_CAPSULE | Freq: Two times a day (BID) | ORAL | Status: DC
  Administered 2013-06-25 – 2013-06-27 (×4): 300 mg via ORAL
  Filled 2013-06-25 (×5): qty 1

## 2013-06-25 MED ORDER — OXYCODONE HCL 10 MG PO TABS: 10.0000 mg | ORAL_TABLET | Freq: Four times a day (QID) | ORAL | Status: DC | PRN

## 2013-06-25 NOTE — ED Notes (Signed)
Hospitalist MD at bedside doing assessment. Will transport to floor when he is finished.

## 2013-06-25 NOTE — ED Notes (Signed)
Patient reports feeling bad all day with chest pain across chest and up to right jaw. Took 324mg  of Asprin and 5 ntg tabs 30 minutes before calling EMS. No relief, so called EMS. They arrived and her BP was 220/100; they gave her 3 nitro tabs, which decreased her pain from 9 down to 2 and decreased BP down to 99/73.  Reports a history of angina and stent placement.

## 2013-06-25 NOTE — H&P (Addendum)
[facility] History and Physical  Patient: Kristine Oliver  [id number]  DOB: 06-06-1931  DOS: the patient was seen and examined on 06/25/2013 PCP: Irven Shelling, MD  Chief Complaint: Chest pain  HPI: Kristine Oliver is a 79 y.o. female with Past medical history of coronary artery disease, chronic diastolic dysfunction, hypertension, irritable bowel syndrome, diabetes mellitus, chronic pain syndrome. The patient is coming from home. The patient presents today to the ED with the complaint of chest pain that initiated on the left side radiated to both her shoulders and also to the neck and was felt like a crushing pain which started around 3:30. She mentions that she woke up with this pain during her sleep. She also had nausea, shortness of breath, diaphoresis along with this pain. She then broke 1 after the other 5 nitroglycerin and the pain improved. The pain lasted for almost one hour. After a while the pain recurred and then she called the EMS. At the time of the arrival of the EMS the patient was found to be hypertensive and EMS gave her 3 more nitroglycerin as she was having continuous chest pain which improved her pain again. Patient denies any complaint of fever, chills, nausea, vomiting, diarrhea, burning urination, leg swelling, orthopnea, PND at present. She mentions she is compliant with all the medications. She mentions the pressure-like sensation has resolved but she still has soreness on her left side of the chest which gets worse with movement of the hand and is reproducible.  Review of Systems: as mentioned in the history of present illness.  A Comprehensive review of the other systems is negative.  Past Medical History  Diagnosis Date  . CAD (coronary artery disease) 02/12/2012    a. NSTEMI 2006:  2.7 x 28 mm Taxus stent to LAD 2006;  b.  Cath 3/12 Dr. Marlou Porch  Normal left main, patent LAD stents with jailed septal, 30% circumflex, 30% RCA    . HTN (hypertension)    . Obesity (BMI 30-39.9)   . Chronic pain disorder   . Hyperthyroidism   . Hyperlipidemia   . Lumbar disc disease   . Bipolar affective disorder   . Scoliosis   . Chronic diastolic heart failure     a. Echo 7/13:  mod LVH, EF 65-70%, vigorous LV, Gr 1 diast dysfn, mild LAE  . IBS (irritable bowel syndrome)   . GERD (gastroesophageal reflux disease)   . Diabetes mellitus     , Type II  . Peripheral neuropathy     , Associated with DM  . Nonproliferative diabetic retinopathy associated with type 2 diabetes mellitus   . DDD (degenerative disc disease), lumbosacral     , Spinal stenosis  . Non Q wave myocardial infarction 2005   Past Surgical History  Procedure Laterality Date  . Cholecystectomy    . Laminotomy Right   . Dilation and curettage of uterus    . Pilonidal cyst excision    . Rotator cuff repair    . Back surgery    . Cardiac catheterization    . Tonsillectomy    . Induced abortion      , x2  . Total abdominal hysterectomy w/ bilateral salpingoophorectomy    . Lumbar disc surgery  07/1999    L5-S1  . Hemilaminotomy lumbar spine  2002    Rt L5, with decompression and foraminotomy   Social History:  reports that she has quit smoking. She does not have any smokeless tobacco history on file.  She reports that she does not drink alcohol or use illicit drugs. Independent for most of her  ADL.  Allergies  Allergen Reactions  . Pravastatin Other (See Comments)    Muscle aches  . Prednisone Nausea And Vomiting    Family History  Problem Relation Age of Onset  . Coronary artery disease Mother   . Heart failure Father   . Diabetes Father     Prior to Admission medications   Medication Sig Start Date End Date Taking? Authorizing Provider  ALPRAZolam (XANAX) 0.25 MG tablet Take 0.25 mg by mouth 3 (three) times daily as needed. anxiety   Yes Historical Provider, MD  amLODipine (NORVASC) 5 MG tablet Take 5 mg by mouth daily.   Yes Historical Provider, MD  aspirin 81  MG tablet Take 81 mg by mouth daily.   Yes Historical Provider, MD  clopidogrel (PLAVIX) 75 MG tablet Take 75 mg by mouth daily.   Yes Historical Provider, MD  estrogen-methylTESTOSTERone 0.625-1.25 MG per tablet Take 1 tablet by mouth daily.   Yes Historical Provider, MD  gabapentin (NEURONTIN) 300 MG capsule Take 300 mg by mouth 2 (two) times daily.   Yes Historical Provider, MD  levothyroxine (SYNTHROID, LEVOTHROID) 75 MCG tablet Take 75 mcg by mouth daily.   Yes Historical Provider, MD  lidocaine (LIDODERM) 5 % Place 2 patches onto the skin daily. Remove & Discard patch within 12 hours or as directed by MD   Yes Historical Provider, MD  metFORMIN (GLUCOPHAGE) 1000 MG tablet Take 1,000 mg by mouth 2 (two) times daily with a meal.   Yes Historical Provider, MD  nitroGLYCERIN (NITROSTAT) 0.4 MG SL tablet Place 1 tablet (0.4 mg total) under the tongue every 5 (five) minutes x 3 doses as needed for chest pain. 05/02/12  Yes [name], MD  Oxycodone HCl 10 MG TABS Take 10 mg by mouth 4 (four) times daily as needed (for pain).    Yes Historical Provider, MD  polyvinyl alcohol (LIQUIFILM TEARS) 1.4 % ophthalmic solution Place 1 drop into both eyes as needed for dry eyes.   Yes Historical Provider, MD  ramipril (ALTACE) 5 MG tablet Take 5 mg by mouth daily.   Yes Historical Provider, MD  valACYclovir (VALTREX) 500 MG tablet Take 1,000 mg by mouth daily.    Yes Historical Provider, MD  vitamin B-12 (CYANOCOBALAMIN) 1000 MCG tablet Take 1,000 mcg by mouth daily.   Yes Historical Provider, MD  zolpidem (AMBIEN) 10 MG tablet Take 10 mg by mouth at bedtime.   Yes Historical Provider, MD    Physical Exam: Filed Vitals:   06/25/13 1816 06/25/13 1937 06/25/13 2000  BP: 142/50 152/38 137/97  Pulse: 61 56 58  Temp: 98 F (36.7 C)    TempSrc: Oral    Resp: 16 16 19   Height: 4\' 11"  (1.499 m)    Weight: 86.183 kg (190 lb)    SpO2: 100% 99% 97%    General: Alert, Awake and Oriented to Time, Place and  Person. Appear in mild distress Eyes: PERRL ENT: Oral Mucosa clear moist. Neck: No JVD Cardiovascular: S1 and S2 Present, no Murmur, Peripheral Pulses Present Respiratory: Bilateral Air entry equal and Decreased, Clear to Auscultation,  No Crackles, no wheezes Abdomen: Bowel Sound Present, Soft and Non tender Skin: No Rash Extremities: Trace Pedal edema, no calf tenderness Neurologic: Grossly Unremarkable.  Labs on Admission:  CBC:  Recent Labs Lab 06/25/13 1806  WBC 7.7  NEUTROABS 5.1  HGB 12.1  HCT  36.2  MCV 95.0  PLT 228    CMP     Component Value Date/Time   NA 136 06/25/2013 1806   K 4.9 06/25/2013 1806   CL 105 06/25/2013 1806   CO2 22 06/25/2013 1806   GLUCOSE 124* 06/25/2013 1806   BUN 18 06/25/2013 1806   CREATININE 1.10 06/25/2013 1806   CALCIUM 10.7* 06/25/2013 1806   PROT 6.2 06/25/2013 1806   ALBUMIN 3.3* 06/25/2013 1806   AST 31 06/25/2013 1806   ALT 32 06/25/2013 1806   ALKPHOS 100 06/25/2013 1806   BILITOT 0.3 06/25/2013 1806   GFRNONAA 46* 06/25/2013 1806   GFRAA 53* 06/25/2013 1806    No results found for this basename: LIPASE, AMYLASE,  in the last 168 hours No results found for this basename: AMMONIA,  in the last 168 hours  No results found for this basename: CKTOTAL, CKMB, CKMBINDEX, TROPONINI,  in the last 168 hours BNP (last 3 results)  Recent Labs  06/25/13 1832  PROBNP 1083.0*    Radiological Exams on Admission: Dg Chest 2 View  06/25/2013   CLINICAL DATA:  Chest pain, congestive heart failure  EXAM: CHEST  2 VIEW  COMPARISON:  05/01/2012  FINDINGS: The heart size and vascular pattern are normal. There is no edema or consolidation. There are no pleural effusions.  IMPRESSION: No active cardiopulmonary disease.   Electronically Signed   By: Skipper Cliche M.D.   On: 06/25/2013 19:24    EKG: Independently reviewed. normal EKG, normal sinus rhythm, unchanged from previous tracings.  Assessment/Plan Principal Problem:   Chest  pain, unspecified Active Problems:   Chronic pain disorder   CAD (coronary artery disease)   Type 2 diabetes mellitus with vascular disease   1. Chest pain, unspecified The patient is presenting with chest pain which was felt as a tightness. She is currently chest pain-free but continues to have some soreness Which is reproducible. Her initial EKG and troponins are negative. Considering her history she will be admitted for observation to rule out ACS. Telemetry monitoring, serial troponins will be obtained. I would continue her home aspirin Plavix and nitroglycerin as needed  2. Diabetes mellitus Holding metformin and placing her on sliding scale  3. Chronic pain syndrome Continue oxycodone as needed  4. Anxiety Continue Xanax  DVT Prophylaxis: subcutaneous Heparin Nutrition: Cardiac and diabetic diet  Code Status: Full  Family Communication: Family was present at bedside, opportunity was given to ask question and all questions were answered satisfactorily at the time of interview. Disposition: Admitted to observation in telemetry unit.  Author: Berle Mull, MD [facility] Pager: [phone] 06/25/2013, 8:50 PM    If 7PM-7AM, please contact night-coverage [email or web address] Password [email or web address]

## 2013-06-25 NOTE — ED Provider Notes (Signed)
CSN: [id number]     Arrival date & time 06/25/13  1800 History   First MD Initiated Contact with Patient 06/25/13 1801     Chief Complaint  Patient presents with  . Chest Pain   (Consider location/radiation/quality/duration/timing/severity/associated sxs/prior Treatment) The history is provided by the patient.  Kristine Oliver is a 79 y.o. female history CAD status post stent, hypertension, heart failure here presenting with chest pain. Chest pain started around 4 PM today from the right side across her entire chest and also up her jaw. She took 5 nitroglycerin at home and her pain improved. EMS went to see the patient and her Lopressor was 220/100 and she was given another 3 nitroglycerin. Patient took 325 mg aspirin already.    Past Medical History  Diagnosis Date  . CAD (coronary artery disease) 02/12/2012    a. NSTEMI 2006:  2.7 x 28 mm Taxus stent to LAD 2006;  b.  Cath 3/12 Dr. Marlou Porch  Normal left main, patent LAD stents with jailed septal, 30% circumflex, 30% RCA    . HTN (hypertension)   . Obesity (BMI 30-39.9)   . Chronic pain disorder   . Hyperthyroidism   . Hyperlipidemia   . Lumbar disc disease   . Bipolar affective disorder   . Scoliosis   . Chronic diastolic heart failure     a. Echo 7/13:  mod LVH, EF 65-70%, vigorous LV, Gr 1 diast dysfn, mild LAE  . IBS (irritable bowel syndrome)   . GERD (gastroesophageal reflux disease)   . Diabetes mellitus     , Type II  . Peripheral neuropathy     , Associated with DM  . Nonproliferative diabetic retinopathy associated with type 2 diabetes mellitus   . DDD (degenerative disc disease), lumbosacral     , Spinal stenosis  . Non Q wave myocardial infarction 2005   Past Surgical History  Procedure Laterality Date  . Cholecystectomy    . Laminotomy Right   . Dilation and curettage of uterus    . Pilonidal cyst excision    . Rotator cuff repair    . Back surgery    . Cardiac catheterization    . Tonsillectomy    . Induced  abortion      , x2  . Total abdominal hysterectomy w/ bilateral salpingoophorectomy    . Lumbar disc surgery  07/1999    L5-S1  . Hemilaminotomy lumbar spine  2002    Rt L5, with decompression and foraminotomy   Family History  Problem Relation Age of Onset  . Coronary artery disease Mother   . Heart failure Father   . Diabetes Father    History  Substance Use Topics  . Smoking status: Former Research scientist (life sciences)  . Smokeless tobacco: Not on file  . Alcohol Use: No     Comment: social drinking, not often   OB History   Grav Para Term Preterm Abortions TAB SAB Ect Mult Living                 Review of Systems  Cardiovascular: Positive for chest pain.  All other systems reviewed and are negative.    Allergies  Pravastatin and Prednisone  Home Medications   Current Outpatient Rx  Name  Route  Sig  Dispense  Refill  . ALPRAZolam (XANAX) 0.25 MG tablet   Oral   Take 0.25 mg by mouth 3 (three) times daily as needed. anxiety         . amLODipine (NORVASC)  5 MG tablet   Oral   Take 5 mg by mouth daily.         Marland Kitchen aspirin 81 MG tablet   Oral   Take 81 mg by mouth daily.         . clindamycin (CLEOCIN T) 1 % lotion   Topical   Apply 1 application topically 2 (two) times daily. For face         . clopidogrel (PLAVIX) 75 MG tablet   Oral   Take 75 mg by mouth daily.         Marland Kitchen estrogen-methylTESTOSTERone 0.625-1.25 MG per tablet   Oral   Take 1 tablet by mouth daily.         Marland Kitchen gabapentin (NEURONTIN) 300 MG capsule   Oral   Take 300 mg by mouth 2 (two) times daily.         Marland Kitchen levothyroxine (SYNTHROID, LEVOTHROID) 75 MCG tablet   Oral   Take 75 mcg by mouth daily.         Marland Kitchen lidocaine (LIDODERM) 5 %   Transdermal   Place 2 patches onto the skin daily. Remove & Discard patch within 12 hours or as directed by MD         . metFORMIN (GLUCOPHAGE) 1000 MG tablet   Oral   Take 1,000 mg by mouth 2 (two) times daily with a meal.         . nitroGLYCERIN  (NITROSTAT) 0.4 MG SL tablet   Sublingual   Place 1 tablet (0.4 mg total) under the tongue every 5 (five) minutes x 3 doses as needed for chest pain.   30 tablet   12   . Oxycodone HCl 10 MG TABS   Oral   Take 10 mg by mouth 4 (four) times daily.         . ramipril (ALTACE) 5 MG tablet   Oral   Take 5 mg by mouth daily.         . valACYclovir (VALTREX) 500 MG tablet   Oral   Take 500 mg by mouth 2 (two) times daily.         Marland Kitchen zolpidem (AMBIEN) 10 MG tablet   Oral   Take 10 mg by mouth at bedtime.          BP 152/38  Pulse 56  Temp(Src) 98 F (36.7 C) (Oral)  Resp 16  Ht 4\' 11"  (1.499 m)  Wt 190 lb (86.183 kg)  BMI 38.35 kg/m2  SpO2 99% Physical Exam  Nursing note and vitals reviewed. Constitutional: She is oriented to person, place, and time. She appears well-developed and well-nourished.  Obese, comfortable   HENT:  Head: Normocephalic.  Mouth/Throat: Oropharynx is clear and moist.  Eyes: Conjunctivae are normal. Pupils are equal, round, and reactive to light.  Neck: Normal range of motion. Neck supple.  Cardiovascular: Normal rate, regular rhythm and normal heart sounds.   Pulmonary/Chest: Effort normal and breath sounds normal. No respiratory distress. She has no wheezes. She has no rales. She exhibits no tenderness.  Abdominal: Soft. Bowel sounds are normal. She exhibits no distension. There is no tenderness. There is no rebound.  Musculoskeletal: Normal range of motion. She exhibits no edema and no tenderness.  Neurological: She is alert and oriented to person, place, and time.  Skin: Skin is warm and dry.  Psychiatric: She has a normal mood and affect. Her behavior is normal. Judgment and thought content normal.    ED Course  Procedures (including critical care time) Labs Review Labs Reviewed  CBC WITH DIFFERENTIAL - Abnormal; Notable for the following:    RBC 3.81 (*)    Eosinophils Relative 6 (*)    All other components within normal limits   COMPREHENSIVE METABOLIC PANEL - Abnormal; Notable for the following:    Glucose, Bld 124 (*)    Calcium 10.7 (*)    Albumin 3.3 (*)    GFR calc non Af Amer 46 (*)    GFR calc Af Amer 53 (*)    All other components within normal limits  PRO B NATRIURETIC PEPTIDE - Abnormal; Notable for the following:    Pro B Natriuretic peptide (BNP) 1083.0 (*)    All other components within normal limits  POCT I-STAT TROPONIN I   Imaging Review Dg Chest 2 View  06/25/2013   CLINICAL DATA:  Chest pain, congestive heart failure  EXAM: CHEST  2 VIEW  COMPARISON:  05/01/2012  FINDINGS: The heart size and vascular pattern are normal. There is no edema or consolidation. There are no pleural effusions.  IMPRESSION: No active cardiopulmonary disease.   Electronically Signed   By: Skipper Cliche M.D.   On: 06/25/2013 19:24    EKG Interpretation    Date/Time:  Sunday June 25 2013 18:37:29 EST Ventricular Rate:  59 PR Interval:  260 QRS Duration: 86 QT Interval:  397 QTC Calculation: 393 R Axis:   6 Text Interpretation:  Sinus rhythm Prolonged PR interval No significant change since last tracing Confirmed by Cortnee Steinmiller  MD, Alaja Goldinger [phone]) on 06/25/2013 7:12:33 PM            MDM  No diagnosis found. Kristine Oliver is a 79 y.o. female here with chest pain. Given previous stent, will likely need admission for r/o ACS. Trop neg x 1.   7:57 PM Patient admitted to tele under Dr. Posey Pronto.   Wandra Arthurs, MD 06/25/13 [phone]

## 2013-06-25 NOTE — ED Notes (Addendum)
EKG delayed due to absence of bed and leads in pt room.

## 2013-06-26 DIAGNOSIS — I5033 Acute on chronic diastolic (congestive) heart failure: Secondary | ICD-10-CM

## 2013-06-26 DIAGNOSIS — I509 Heart failure, unspecified: Secondary | ICD-10-CM

## 2013-06-26 DIAGNOSIS — I059 Rheumatic mitral valve disease, unspecified: Secondary | ICD-10-CM

## 2013-06-26 LAB — PROTIME-INR
INR: 1.01 (ref 0.00–1.49)
Prothrombin Time: 13.1 seconds (ref 11.6–15.2)

## 2013-06-26 LAB — CBC WITH DIFFERENTIAL/PLATELET
Basophils Absolute: 0.1 10*3/uL (ref 0.0–0.1)
Basophils Relative: 1 % (ref 0–1)
Eosinophils Relative: 6 % — ABNORMAL HIGH (ref 0–5)
HCT: 37.9 % (ref 36.0–46.0)
Hemoglobin: 12.6 g/dL (ref 12.0–15.0)
Lymphs Abs: 3.1 10*3/uL (ref 0.7–4.0)
MCH: 31.9 pg (ref 26.0–34.0)
MCHC: 33.2 g/dL (ref 30.0–36.0)
MCV: 95.9 fL (ref 78.0–100.0)
Monocytes Absolute: 0.7 10*3/uL (ref 0.1–1.0)
Monocytes Relative: 8 % (ref 3–12)
Neutro Abs: 4.2 10*3/uL (ref 1.7–7.7)
RDW: 14.1 % (ref 11.5–15.5)
WBC: 8.6 10*3/uL (ref 4.0–10.5)

## 2013-06-26 LAB — COMPREHENSIVE METABOLIC PANEL
Albumin: 3.4 g/dL — ABNORMAL LOW (ref 3.5–5.2)
BUN: 18 mg/dL (ref 6–23)
CO2: 19 mEq/L (ref 19–32)
Calcium: 10.8 mg/dL — ABNORMAL HIGH (ref 8.4–10.5)
Chloride: 107 mEq/L (ref 96–112)
Creatinine, Ser: 0.89 mg/dL (ref 0.50–1.10)
GFR calc non Af Amer: 60 mL/min — ABNORMAL LOW (ref >90)
Total Bilirubin: 0.3 mg/dL (ref 0.3–1.2)

## 2013-06-26 LAB — TROPONIN I: Troponin I: <0.3 ng/mL (ref <0.30)

## 2013-06-26 MED ORDER — METFORMIN HCL 500 MG PO TABS
1000.0000 mg | ORAL_TABLET | Freq: Two times a day (BID) | ORAL | Status: DC
  Administered 2013-06-26 – 2013-06-27 (×3): 1000 mg via ORAL
  Filled 2013-06-26 (×4): qty 2

## 2013-06-26 NOTE — Progress Notes (Addendum)
Patient refusing all CBG checks and insulin.  Dr. Laurann Montana notified. Will continue to monitor. Cutten, Ardeth Sportsman

## 2013-06-26 NOTE — Progress Notes (Signed)
Subjective: No further chest pain  Objective: Vital signs in last 24 hours: Temp:  [98 F (36.7 C)-98.2 F (36.8 C)] 98 F (36.7 C) (11/24 0500) Pulse Rate:  [55-61] 55 (11/24 0500) Resp:  [16-20] 20 (11/24 0500) BP: (137-152)/(38-97) 151/60 mmHg (11/24 0500) SpO2:  [97 %-100 %] 97 % (11/24 0500) Weight:  [86.183 kg (190 lb)-87.181 kg (192 lb 3.2 oz)] 87.181 kg (192 lb 3.2 oz) (11/23 2100) Weight change:  Last BM Date: 06/25/13  Intake/Output from previous day:   Intake/Output this shift:    General appearance: alert and cooperative Resp: clear to auscultation bilaterally Cardio: regular rate and rhythm, S1, S2 normal, no murmur, click, rub or gallop Extremities: extremities normal, atraumatic, no cyanosis or edema  Lab Results:  Recent Labs  06/25/13 1806 06/26/13 0614  WBC 7.7 8.6  HGB 12.1 12.6  HCT 36.2 37.9  PLT 228 PLATELET CLUMPS NOTED ON SMEAR, COUNT APPEARS ADEQUATE   BMET  Recent Labs  06/25/13 1806 06/26/13 0614  NA 136 137  K 4.9 4.8  CL 105 107  CO2 22 19  GLUCOSE 124* 93  BUN 18 18  CREATININE 1.10 0.89  CALCIUM 10.7* 10.8*    Studies/Results: Dg Chest 2 View  06/25/2013   CLINICAL DATA:  Chest pain, congestive heart failure  EXAM: CHEST  2 VIEW  COMPARISON:  05/01/2012  FINDINGS: The heart size and vascular pattern are normal. There is no edema or consolidation. There are no pleural effusions.  IMPRESSION: No active cardiopulmonary disease.   Electronically Signed   By: Skipper Cliche M.D.   On: 06/25/2013 19:24    Medications: I have reviewed the patient's current medications.  Assessment/Plan: Principal Problem:   Chest pain, unspecified, has ruled out thus far.  Will Ask cardiology to see Active Problems:   CAD (coronary artery disease)   Type 2 diabetes mellitus with vascular disease restart metformin, d/c SSI   LOS: 1 day   Kristine Oliver JOSEPH 06/26/2013, 8:09 AM

## 2013-06-26 NOTE — Consult Note (Addendum)
Admit date: 06/25/2013 Referring Physician  Dr. Laurann Montana Primary Physician  Dr. Laurann Montana Primary Cardiologist  Dr. Marlou Porch Reason for Consultation  Chest pain  HPI: Kristine Oliver is a 79 y.o. female with past medical history of coronary artery disease, chronic diastolic dysfunction, hypertension, irritable bowel syndrome, diabetes mellitus, chronic pain syndrome. The patient presented to the ED with the complaint of chest pain that initiated on the left side radiated to both her shoulders and also to the neck and was felt like a crushing pain which started around 3:30. She mentions that she woke up with this pain during her sleep. She also had nausea, shortness of breath, diaphoresis along with this pain. She then broke 1 after the other 5 nitroglycerin and the pain improved. The pain lasted for almost one hour. After a while the pain recurred and then she called the EMS.  At the time of the arrival of the EMS the patient was found to be hypertensive and EMS gave her 3 more nitroglycerin as she was having continuous chest pain which improved her pain again. Patient denied any complaint of fever, chills, nausea, vomiting, diarrhea, burning urination, leg swelling, orthopnea, PND.  The pressure-like sensation has resolved but she still has soreness on her left side of the chest which gets worse with movement of the hand and is reproducible.         PMH:   Past Medical History  Diagnosis Date  . CAD (coronary artery disease) 02/12/2012    a. NSTEMI 2006:  2.7 x 28 mm Taxus stent to LAD 2006;  b.  Cath 3/12 Dr. Marlou Porch  Normal left main, patent LAD stents with jailed septal, 30% circumflex, 30% RCA    . HTN (hypertension)   . Obesity (BMI 30-39.9)   . Chronic pain disorder   . Hyperthyroidism   . Hyperlipidemia   . Lumbar disc disease   . Bipolar affective disorder   . Scoliosis   . Chronic diastolic heart failure     a. Echo 7/13:  mod LVH, EF 65-70%, vigorous LV, Gr 1 diast dysfn, mild LAE    . IBS (irritable bowel syndrome)   . GERD (gastroesophageal reflux disease)   . Diabetes mellitus     , Type II  . Peripheral neuropathy     , Associated with DM  . Nonproliferative diabetic retinopathy associated with type 2 diabetes mellitus   . DDD (degenerative disc disease), lumbosacral     , Spinal stenosis  . Non Q wave myocardial infarction 2005     PSH:   Past Surgical History  Procedure Laterality Date  . Cholecystectomy    . Laminotomy Right   . Dilation and curettage of uterus    . Pilonidal cyst excision    . Rotator cuff repair    . Back surgery    . Cardiac catheterization    . Tonsillectomy    . Induced abortion      , x2  . Total abdominal hysterectomy w/ bilateral salpingoophorectomy    . Lumbar disc surgery  07/1999    L5-S1  . Hemilaminotomy lumbar spine  2002    Rt L5, with decompression and foraminotomy    Allergies:  Pravastatin and Prednisone Prior to Admit Meds:   Prescriptions prior to admission  Medication Sig Dispense Refill  . ALPRAZolam (XANAX) 0.25 MG tablet Take 0.25 mg by mouth 3 (three) times daily as needed. anxiety      . amLODipine (NORVASC) 5 MG tablet Take 5  mg by mouth daily.      Marland Kitchen aspirin 81 MG tablet Take 81 mg by mouth daily.      . clopidogrel (PLAVIX) 75 MG tablet Take 75 mg by mouth daily.      Marland Kitchen estrogen-methylTESTOSTERone 0.625-1.25 MG per tablet Take 1 tablet by mouth daily.      Marland Kitchen gabapentin (NEURONTIN) 300 MG capsule Take 300 mg by mouth 2 (two) times daily.      Marland Kitchen levothyroxine (SYNTHROID, LEVOTHROID) 75 MCG tablet Take 75 mcg by mouth daily.      Marland Kitchen lidocaine (LIDODERM) 5 % Place 2 patches onto the skin daily. Remove & Discard patch within 12 hours or as directed by MD      . metFORMIN (GLUCOPHAGE) 1000 MG tablet Take 1,000 mg by mouth 2 (two) times daily with a meal.      . nitroGLYCERIN (NITROSTAT) 0.4 MG SL tablet Place 1 tablet (0.4 mg total) under the tongue every 5 (five) minutes x 3 doses as needed for chest  pain.  30 tablet  12  . Oxycodone HCl 10 MG TABS Take 10 mg by mouth 4 (four) times daily as needed (for pain).       . polyvinyl alcohol (LIQUIFILM TEARS) 1.4 % ophthalmic solution Place 1 drop into both eyes as needed for dry eyes.      . ramipril (ALTACE) 5 MG tablet Take 5 mg by mouth daily.      . valACYclovir (VALTREX) 500 MG tablet Take 1,000 mg by mouth daily.       . vitamin B-12 (CYANOCOBALAMIN) 1000 MCG tablet Take 1,000 mcg by mouth daily.      Marland Kitchen zolpidem (AMBIEN) 10 MG tablet Take 10 mg by mouth at bedtime.       Fam HX:    Family History  Problem Relation Age of Onset  . Coronary artery disease Mother   . Heart failure Father   . Diabetes Father    Social HX:    History   Social History  . Marital Status: Divorced    Spouse Name: N/A    Number of Children: N/A  . Years of Education: N/A   Occupational History  . Not on file.   Social History Main Topics  . Smoking status: Former Research scientist (life sciences)  . Smokeless tobacco: Not on file  . Alcohol Use: No     Comment: social drinking, not often  . Drug Use: No  . Sexual Activity: Not on file   Other Topics Concern  . Not on file   Social History Narrative   Divorced.     ROS:  All 11 ROS were addressed and are negative except what is stated in the HPI  Physical Exam: Blood pressure 151/60, pulse 55, temperature 98 F (36.7 C), temperature source Oral, resp. rate 20, height 4' 11.5" (1.511 m), weight 192 lb 3.2 oz (87.181 kg), SpO2 97.00%.    General: Well developed, well nourished, in no acute distress Head: Eyes PERRLA, No xanthomas.   Normal cephalic and atramatic  Lungs:   Clear bilaterally to auscultation and percussion. Heart:   HRRR S1 S2 Pulses are 2+ & equal.            No carotid bruit. No JVD.  No abdominal bruits. No femoral bruits. Abdomen: Bowel sounds are positive, abdomen soft and non-tender without masses  Extremities:   No clubbing, cyanosis or edema.  DP +1 Neuro: Alert and oriented X 3. Psych:   Good affect,  responds appropriately    Labs:   Lab Results  Component Value Date   WBC 8.6 06/26/2013   HGB 12.6 06/26/2013   HCT 37.9 06/26/2013   MCV 95.9 06/26/2013   PLT PLATELET CLUMPS NOTED ON SMEAR, COUNT APPEARS ADEQUATE 06/26/2013    Recent Labs Lab 06/26/13 0614  NA 137  K 4.8  CL 107  CO2 19  BUN 18  CREATININE 0.89  CALCIUM 10.8*  PROT 6.1  BILITOT 0.3  ALKPHOS 102  ALT 32  AST 30  GLUCOSE 93   No results found for this basename: PTT   Lab Results  Component Value Date   INR 1.01 06/26/2013   INR 1.00 05/01/2012   INR 0.98 02/12/2012   Lab Results  Component Value Date   CKTOTAL 87 02/13/2012   CKMB 4.2* 02/13/2012   TROPONINI <0.30 06/26/2013     Lab Results  Component Value Date   CHOL 155 05/02/2012   CHOL  Value: 137        ATP III CLASSIFICATION:  <200     mg/dL   Desirable  200-239  mg/dL   Borderline High  >=240    mg/dL   High        10/26/2010   CHOL  Value: 115        ATP III CLASSIFICATION:  <200     mg/dL   Desirable  200-239  mg/dL   Borderline High  >=240    mg/dL   High 02/19/2008   Lab Results  Component Value Date   HDL 28* 05/02/2012   HDL 38* 10/26/2010   HDL 25* 02/19/2008   Lab Results  Component Value Date   LDLCALC 80 05/02/2012   LDLCALC  Value: 65        Total Cholesterol/HDL:CHD Risk Coronary Heart Disease Risk Table                     Men   Women  1/2 Average Risk   3.4   3.3  Average Risk       5.0   4.4  2 X Average Risk   9.6   7.1  3 X Average Risk  23.4   11.0        Use the calculated Patient Ratio above and the CHD Risk Table to determine the patient's CHD Risk.        ATP III CLASSIFICATION (LDL):  <100     mg/dL   Optimal  100-129  mg/dL   Near or Above                    Optimal  130-159  mg/dL   Borderline  160-189  mg/dL   High  >190     mg/dL   Very High 10/26/2010   LDLCALC  Value: 60        Total Cholesterol/HDL:CHD Risk Coronary Heart Disease Risk Table                     Men   Women  1/2 Average Risk   3.4    3.3 02/19/2008   Lab Results  Component Value Date   TRIG 233* 05/02/2012   TRIG 170* 10/26/2010   TRIG 151* 02/19/2008   Lab Results  Component Value Date   CHOLHDL 5.5 05/02/2012   CHOLHDL 3.6 10/26/2010   CHOLHDL 4.6 02/19/2008   No results found for this basename: LDLDIRECT  Radiology:  Dg Chest 2 View  06/25/2013   CLINICAL DATA:  Chest pain, congestive heart failure  EXAM: CHEST  2 VIEW  COMPARISON:  05/01/2012  FINDINGS: The heart size and vascular pattern are normal. There is no edema or consolidation. There are no pleural effusions.  IMPRESSION: No active cardiopulmonary disease.   Electronically Signed   By: Skipper Cliche M.D.   On: 06/25/2013 19:24    EKG:  NSR  ASSESSMENT:  1.  Chest pain syndrome with typical and atypical components.  Cardiac enzymes thus far are normal. 2.  ASCAD 3.  Acute on chronic diastolic CHF - her diuretics had been stopped in clinic due to elevated BNP and orthostatic dizziness.    PLAN:   1.  NPO after midnight 2.  Stress CL in am if no elevation in troponin - she does not want a chemical stress test 3.  2D echo to assess LVF - if normal proceed with nuclear stress test in am 4.  Lasix 40mg  IV today  Sueanne Margarita, MD  06/26/2013  10:16 AM

## 2013-06-26 NOTE — Progress Notes (Signed)
  Echocardiogram 2D Echocardiogram has been performed.  Kristine Oliver, [place] 06/26/2013, 12:12 PM

## 2013-06-27 ENCOUNTER — Observation Stay (HOSPITAL_COMMUNITY): Payer: Medicare Other

## 2013-06-27 DIAGNOSIS — E669 Obesity, unspecified: Secondary | ICD-10-CM

## 2013-06-27 DIAGNOSIS — I2510 Atherosclerotic heart disease of native coronary artery without angina pectoris: Secondary | ICD-10-CM

## 2013-06-27 DIAGNOSIS — I5031 Acute diastolic (congestive) heart failure: Secondary | ICD-10-CM

## 2013-06-27 DIAGNOSIS — R079 Chest pain, unspecified: Secondary | ICD-10-CM

## 2013-06-27 DIAGNOSIS — E785 Hyperlipidemia, unspecified: Secondary | ICD-10-CM

## 2013-06-27 MED ORDER — TECHNETIUM TC 99M SESTAMIBI GENERIC - CARDIOLITE: 30.0000 | Freq: Once | INTRAVENOUS | Status: DC | PRN

## 2013-06-27 MED ORDER — TECHNETIUM TC 99M SESTAMIBI GENERIC - CARDIOLITE: 10.0000 | Freq: Once | INTRAVENOUS | Status: DC | PRN

## 2013-06-27 NOTE — Progress Notes (Signed)
Received call from [place] regarding the patient wearing a back brace for nuclear medicine stress test.  Dr. Nelva Bush confirmed that the patient does not require a back brace for the treadmill test.

## 2013-06-27 NOTE — Evaluation (Signed)
Physical Therapy Evaluation Patient Details Name: Kristine Oliver MRN: [id number] DOB: 1930/10/17 Today's Date: 06/27/2013 Time: [id number] PT Time Calculation (min): 10 min  PT Assessment / Plan / Recommendation History of Present Illness  79 year old female admitted with chest pain. Pt has multiple medical historyincluding CAD, DDD, spinal stenosis, scoliosis. Pt was able to walk on treadmill for stress test today  Clinical Impression  79 yo female with previous orthopedic impairments that affect her gait pattern. She is interested in getting a specific type of cane that may not be available at local DME stores, so it may best if she obtains a written prescription for a cane and she can pursue it on her own. She is able to walk short distances without assistive device. Pt reports she plans to exercise on her own and reintegrate into community exercise programs as she has done in the past.  Pt does not need skilled PT at this time.    PT Assessment  Patent does not need any further PT services    Follow Up Recommendations  No PT follow up    Does the patient have the potential to tolerate intense rehabilitation      Barriers to Discharge        Equipment Recommendations   (pt wants a specific type of cane --she may need to get it herself)    Recommendations for Other Services     Frequency      Precautions / Restrictions     Pertinent Vitals/Pain Pt with generalized back discomfort      Mobility  Bed Mobility Bed Mobility: Rolling Right;Rolling Left;Supine to Sit Rolling Right: 7: Independent Rolling Left: 7: Independent Supine to Sit: 7: Independent Transfers Transfers: Sit to Stand;Stand to Sit Sit to Stand: 7: Independent Stand to Sit: 7: Independent Ambulation/Gait Ambulation/Gait Assistance: 6: Modified independent (Device/Increase time) Ambulation Distance (Feet): 25 Feet Assistive device: None Ambulation/Gait Assistance Details: no assist needed Gait Pattern:  Step-through pattern;Lateral trunk lean to right;Lateral trunk lean to left;Right hip hike General Gait Details: Pt states " I walk like a crab"   " I want something to help hold my right side up" Stairs: No Wheelchair Mobility Wheelchair Mobility: No    Exercises     PT Diagnosis:    PT Problem List:   PT Treatment Interventions:       PT Goals(Current goals can be found in the care plan section) Acute Rehab PT Goals PT Goal Formulation: No goals set, d/c therapy  Visit Information  Last PT Received On: 06/27/13 Assistance Needed: +1 History of Present Illness: 79 year old female admitted with chest pain. Pt has multiple medical historyincluding CAD, DDD, spinal stenosis, scoliosis. Pt was able to walk on treadmill for stress test today       Prior [place] expects to be discharged to:: Private residence Living Arrangements: Other relatives (nephew) Available Help at Discharge: Family Home Equipment: Kasandra Knudsen - single point Additional Comments: Pt states she is interested in getting a "hurry-cane" that she say adverstised. She is not interested in a walker or stationary quad cane Prior Function Level of Independence: Independent Communication Communication: No difficulties    Cognition  Cognition Arousal/Alertness: Awake/alert Behavior During Therapy: Impulsive Overall Cognitive Status: Within Functional Limits for tasks assessed    Extremity/Trunk Assessment Lower Extremity Assessment Lower Extremity Assessment:  (pt observed to move legs against gravity) Cervical / Trunk Assessment Cervical / Trunk Assessment: Other exceptions Cervical / Trunk Exceptions: Not  formally assessed,but pt reports she has history of scoliosis   Balance Balance Balance Assessed: Yes Static Sitting Balance Static Sitting - Balance Support: No upper extremity supported Static Sitting - Level of Assistance: 7: Independent Static Standing Balance Static Standing  - Balance Support: During functional activity;No upper extremity supported Static Standing - Level of Assistance: 7: Independent  End of Session PT - End of Session Activity Tolerance: Patient tolerated treatment well Patient left: in bed;with nursing/sitter in room (sitting on edge of bed eating lunch)  GP Functional Limitation: Mobility: Walking and moving around Mobility: Walking and Moving Around Current Status [id number]): At least 1 percent but less than 20 percent impaired, limited or restricted Mobility: Walking and Moving Around Goal Status [phone]): At least 1 percent but less than 20 percent impaired, limited or restricted Mobility: Walking and Moving Around Discharge Status [phone]): At least 1 percent but less than 20 percent impaired, limited or restricted   Norwood Levo 06/27/2013, 1:27 PM

## 2013-06-27 NOTE — Progress Notes (Signed)
Patient is in Radiology to have a nuclear medicine stress test.  Patient requests to walk on the treadmill.  Patient also states she wears a back brace and  Does not have the brace with her.  Dr. Marlou Porch notified of concern of patient waling on treadmill without back brace.  Dr. Nelva Bush is the orthopaedic physician the patient is seen by.  Dr. Nelva Bush office contacted regarding the patient walking on treadmill without back brace for the nuclear medicine stress test.  Awaiting a call back.

## 2013-06-27 NOTE — Progress Notes (Signed)
Subjective: No further pain  Objective: Vital signs in last 24 hours: Temp:  [97.8 F (36.6 C)-98.7 F (37.1 C)] 98.7 F (37.1 C) (11/25 0509) Pulse Rate:  [53-61] 53 (11/25 0509) Resp:  [18-20] 20 (11/25 0509) BP: (118-181)/(42-67) 131/52 mmHg (11/25 0509) SpO2:  [95 %-99 %] 98 % (11/25 0509) Weight change:  Last BM Date: 06/25/13  Intake/Output from previous day: 11/24 0701 - 11/25 0700 In: 200 [P.O.:200] Out: 250 [Urine:250] Intake/Output this shift:    General appearance: alert and cooperative Resp: clear to auscultation bilaterally Cardio: regular rate and rhythm, S1, S2 normal, no murmur, click, rub or gallop  Lab Results:  Recent Labs  06/25/13 1806 06/26/13 0614  WBC 7.7 8.6  HGB 12.1 12.6  HCT 36.2 37.9  PLT 228 PLATELET CLUMPS NOTED ON SMEAR, COUNT APPEARS ADEQUATE   BMET  Recent Labs  06/25/13 1806 06/26/13 0614  NA 136 137  K 4.9 4.8  CL 105 107  CO2 22 19  GLUCOSE 124* 93  BUN 18 18  CREATININE 1.10 0.89  CALCIUM 10.7* 10.8*    Studies/Results: Dg Chest 2 View  06/25/2013   CLINICAL DATA:  Chest pain, congestive heart failure  EXAM: CHEST  2 VIEW  COMPARISON:  05/01/2012  FINDINGS: The heart size and vascular pattern are normal. There is no edema or consolidation. There are no pleural effusions.  IMPRESSION: No active cardiopulmonary disease.   Electronically Signed   By: Skipper Cliche M.D.   On: 06/25/2013 19:24    Medications: I have reviewed the patient's current medications.  Assessment/Plan: Principal Problem:  Chest pain, unspecified, has ruled out thus far. Cardiology not appreciated, echo noted.  Nuclear stress test today.  If normal possible discharge later today Active Problems:  CAD (coronary artery disease)  Type 2 diabetes mellitus with vascular disease restart metformin, d/c SSI   LOS: 2 days   Jerae Izard JOSEPH 06/27/2013, 7:27 AM

## 2013-06-27 NOTE — Progress Notes (Signed)
I sent patient to have her stress test. She was a little sleepy but I came to her room this morning to wake her up and the transporter was ready to take her. She said she didn't get much sleep last night but needed to wash her face off to help wake herself up. She did her ADLs independently and went to nuclear medicine. About 10 minutes after I sent her, I began to get alerts that her heart rate was in the 30s. I called down to nuc med and they said she was complaining of felling sleepy. I immediately went down to see pt. She was having some pauses up to 2.8 seconds. Her blood pressure was 160/60. She was the same as I sent her. She stated she took some ambien last night. I called Dr. Marlou Porch to make him aware of her heart rate and her being tired. As I was on the phone with him reviewing what prn meds she was given last night, I noticed that Ambien wasn't given. I told her that she didn't receive Ambien per our records. She told me that she took her own meds and Ambien and Xanax through the night on top of her prn oxycodone that the night shift RN gave. I spoke with patient and reinforced that it wasn't our policy for patients to have meds at the bedside. Dr. Marlou Porch updated and stated to continue test. He reviewed her strips and noticed some Wenckebach. I checked on patient throughout her test. WIll continue to monitor and when patient comes back to room, meds will be stored as policy

## 2013-06-27 NOTE — Progress Notes (Signed)
Dr. Marlou Porch arrived to complete Nuclear Medicine stress test.  Patient states she will be able to walk on the treadmill and she doesn't have to have her brace. Dr. Nelva Bush has not returned call at this time. Patient did walk on the treadmill without physical difficulty however she did experience increased heart and respiratory rate. Stress test was completed.

## 2013-06-27 NOTE — Progress Notes (Signed)
    Subjective:  Sleepy this am. Took her home xanax unsupervised. Tele with occasional asymptomatic pause, Wenkebach.   No further CP, no SOB.   Objective:  Vital Signs in the last 24 hours: Temp:  [97.8 F (36.6 C)-98.7 F (37.1 C)] 98.7 F (37.1 C) (11/25 0509) Pulse Rate:  [44-121] 61 (11/25 1029) Resp:  [18-20] 20 (11/25 0509) BP: (106-131)/(42-67) 131/42 mmHg (11/25 1025) SpO2:  [95 %-98 %] 98 % (11/25 0509)  Intake/Output from previous day: 11/24 0701 - 11/25 0700 In: 200 [P.O.:200] Out: 250 [Urine:250]   Physical Exam: General: Well developed, well nourished, in no acute distress. Head:  Normocephalic and atraumatic. Lungs: Clear to auscultation and percussion. Heart: Normal S1 and S2.  No murmur, rubs or gallops.  Abdomen: soft, non-tender, positive bowel sounds. Obese Extremities: No clubbing or cyanosis. No edema. Neurologic: Alert and oriented x 3.    Lab Results:  Recent Labs  06/25/13 1806 06/26/13 0614  WBC 7.7 8.6  HGB 12.1 12.6  PLT 228 PLATELET CLUMPS NOTED ON SMEAR, COUNT APPEARS ADEQUATE    Recent Labs  06/25/13 1806 06/26/13 0614  NA 136 137  K 4.9 4.8  CL 105 107  CO2 22 19  GLUCOSE 124* 93  BUN 18 18  CREATININE 1.10 0.89    Recent Labs  06/25/13 2317 06/26/13 0859  TROPONINI <0.30 <0.30   Hepatic Function Panel  Recent Labs  06/26/13 0614  PROT 6.1  ALBUMIN 3.4*  AST 30  ALT 32  ALKPHOS 102  BILITOT 0.3   Imaging: Dg Chest 2 View  06/25/2013   CLINICAL DATA:  Chest pain, congestive heart failure  EXAM: CHEST  2 VIEW  COMPARISON:  05/01/2012  FINDINGS: The heart size and vascular pattern are normal. There is no edema or consolidation. There are no pleural effusions.  IMPRESSION: No active cardiopulmonary disease.   Electronically Signed   By: Skipper Cliche M.D.   On: 06/25/2013 19:24      Telemetry: Occasional mobitz type one/ pause 2.4 second. SR with first degree avb, sb. Personally viewed.  Cardiac  Studies:  Await nuc images  Assessment/Plan:  Principal Problem:   Chest pain, unspecified Active Problems:   CAD (coronary artery disease)   Type 2 diabetes mellitus with vascular disease   Acute on chronic diastolic congestive heart failure   1) Chest pain  - await NUC images  - non diagnostic upsloping ST depression 30mm  - no symptoms  - POOR exercise tolerance  - Cath 3/12 Dr. Marlou Porch Normal left main, patent LAD stents with jailed septal, 30% circumflex, 30% RCA   2) First degree AVB, mobitz 1 second degree, sinus pauses.   - avoid AV nodal blocking agents  - asymptomatic  3) Diastolic heart failure  - improved with one dose of IV lasix  - BP controlled  - no changes  - discussed once again the importance of salt restriction (GOLDEN CORRAL)   4) Obesity - needs to continue to try to loose weight.   I am fine with DC later today if NUC stress OK.   Ricka Westra, [place] 06/27/2013, 10:43 AM

## 2013-06-27 NOTE — Progress Notes (Signed)
I called the report given to me by Dr. Natalia Leatherwood to Dr. Laurann Montana. Orders to go home will be in computer shortly

## 2013-06-27 NOTE — Progress Notes (Signed)
I update Dr. Laurann Montana on events of this am with pt taking her meds in the room and heart rate in the 30s. He watned me to hold her norvasc and altace for today. I will update him on stress test when available

## 2013-06-28 NOTE — Discharge Summary (Signed)
Physician Discharge Summary  Patient ID: Kristine Oliver MRN: [id number] DOB/AGE: 1931-07-11 79 y.o.  Admit date: 06/25/2013 Discharge date: 06/28/2013  Admission Diagnoses: Chest pain Coronary artery disease Type 2 diabetes Congestive heart, diastolic dysfunction  Discharge Diagnoses:  Principal Problem:   Chest pain, unspecified Active Problems:   CAD (coronary artery disease)   Type 2 diabetes mellitus with vascular disease   Acute on chronic diastolic congestive heart failure   Discharged Condition: good  Hospital Course:  The patient was admitted on November 23 complaining of an episode of chest pain radiating to her shoulders and jaw that woke her up from sleep. She had nausea, shortness of breath and diaphoresis. She took several nitroglycerin with improvement in pain. EKG was nonacute. Chest x-ray showed no acute disease. The patient was admitted and had no further chest pain during admission. She ruled out for an MI. She was seen by cardiology who felt that she had chest pain syndrome with some typical and atypical components. 2-D echocardiogram was done and showed mild focal basal hypertrophy, normal systolic function and grade 1 diastolic dysfunction. A nuclear stress test was done on November 25 and was negative for reversible or inducible ischemia, small fixed defect in the anterior septum extending to the mid ventricle to the apex, ejection fraction 62%. The patient was discharged in good condition  Consults: cardiology  Significant Diagnostic Studies: labs: WBC 8.6 hemoglobin 12.6, sodium 137 potassium 4.8 chloride 107 bicarbonate 19 BUN 18 creatinine 0.9 calcium 10.8, all troponins normal , pro BNP 1083 radiology: CXR: normal and cardiac graphics: Echocardiogram: As above  Treatments: cardiac meds: ramipril (Altace), amlodipine and nitrates, aspirin and Plavix  Discharge Exam: Blood pressure 130/67, pulse 67, temperature 97.7 F (36.5 C), temperature source Oral, resp.  rate 18, height 4' 11.5" (1.511 m), weight 87.181 kg (192 lb 3.2 oz), SpO2 96.00%. Resp: clear to auscultation bilaterally Cardio: regular rate and rhythm, S1, S2 normal, no murmur, click, rub or gallop  Disposition: 01-Home or Self Care     Medication List         ALPRAZolam 0.25 MG tablet  Commonly known as:  XANAX  Take 0.25 mg by mouth 3 (three) times daily as needed. anxiety     amLODipine 5 MG tablet  Commonly known as:  NORVASC  Take 5 mg by mouth daily.     aspirin 81 MG tablet  Take 81 mg by mouth daily.     clopidogrel 75 MG tablet  Commonly known as:  PLAVIX  Take 75 mg by mouth daily.     estrogen-methylTESTOSTERone 0.625-1.25 MG per tablet  Take 1 tablet by mouth daily.     gabapentin 300 MG capsule  Commonly known as:  NEURONTIN  Take 300 mg by mouth 2 (two) times daily.     levothyroxine 75 MCG tablet  Commonly known as:  SYNTHROID, LEVOTHROID  Take 75 mcg by mouth daily.     lidocaine 5 %  Commonly known as:  LIDODERM  Place 2 patches onto the skin daily. Remove & Discard patch within 12 hours or as directed by MD     metFORMIN 1000 MG tablet  Commonly known as:  GLUCOPHAGE  Take 1,000 mg by mouth 2 (two) times daily with a meal.     nitroGLYCERIN 0.4 MG SL tablet  Commonly known as:  NITROSTAT  Place 1 tablet (0.4 mg total) under the tongue every 5 (five) minutes x 3 doses as needed for chest pain.  Oxycodone HCl 10 MG Tabs  Take 10 mg by mouth 4 (four) times daily as needed (for pain).     polyvinyl alcohol 1.4 % ophthalmic solution  Commonly known as:  LIQUIFILM TEARS  Place 1 drop into both eyes as needed for dry eyes.     ramipril 5 MG tablet  Commonly known as:  ALTACE  Take 5 mg by mouth daily.     valACYclovir 500 MG tablet  Commonly known as:  VALTREX  Take 1,000 mg by mouth daily.     vitamin B-12 1000 MCG tablet  Commonly known as:  CYANOCOBALAMIN  Take 1,000 mcg by mouth daily.     zolpidem 10 MG tablet  Commonly  known as:  AMBIEN  Take 10 mg by mouth at bedtime.         SignedIrven Shelling 06/28/2013, 7:41 AM

## 2013-10-30 ENCOUNTER — Ambulatory Visit (INDEPENDENT_AMBULATORY_CARE_PROVIDER_SITE_OTHER): Payer: 59 | Admitting: Physician Assistant

## 2013-10-30 ENCOUNTER — Encounter: Payer: Self-pay | Admitting: Physician Assistant

## 2013-10-30 VITALS — BP 130/50 | HR 47 | Ht 59.5 in | Wt 186.0 lb

## 2013-10-30 DIAGNOSIS — I10 Essential (primary) hypertension: Secondary | ICD-10-CM

## 2013-10-30 DIAGNOSIS — R531 Weakness: Secondary | ICD-10-CM | POA: Insufficient documentation

## 2013-10-30 DIAGNOSIS — R001 Bradycardia, unspecified: Secondary | ICD-10-CM | POA: Insufficient documentation

## 2013-10-30 DIAGNOSIS — E039 Hypothyroidism, unspecified: Secondary | ICD-10-CM

## 2013-10-30 DIAGNOSIS — I509 Heart failure, unspecified: Secondary | ICD-10-CM

## 2013-10-30 DIAGNOSIS — I5033 Acute on chronic diastolic (congestive) heart failure: Secondary | ICD-10-CM

## 2013-10-30 DIAGNOSIS — R5381 Other malaise: Secondary | ICD-10-CM

## 2013-10-30 DIAGNOSIS — R5383 Other fatigue: Secondary | ICD-10-CM

## 2013-10-30 DIAGNOSIS — I498 Other specified cardiac arrhythmias: Secondary | ICD-10-CM

## 2013-10-30 LAB — TSH: TSH: 1.37 u[IU]/mL (ref 0.35–5.50)

## 2013-10-30 LAB — BASIC METABOLIC PANEL
BUN: 18 mg/dL (ref 6–23)
CHLORIDE: 106 meq/L (ref 96–112)
CO2: 26 mEq/L (ref 19–32)
Calcium: 11.5 mg/dL — ABNORMAL HIGH (ref 8.4–10.5)
Creatinine, Ser: 1.1 mg/dL (ref 0.4–1.2)
GFR: 49.13 mL/min — ABNORMAL LOW (ref >60.00)
GLUCOSE: 88 mg/dL (ref 70–99)
POTASSIUM: 4.5 meq/L (ref 3.5–5.1)
Sodium: 138 mEq/L (ref 135–145)

## 2013-10-30 NOTE — Assessment & Plan Note (Signed)
Patient's fatigue could be due to to both of bradycardia but also the gabapentin that she's been taking for years. We have asked her to discuss this with her primary care. We will also check a TSH and CBC.

## 2013-10-30 NOTE — Assessment & Plan Note (Signed)
Patient is bradycardic at 45 beats per minute and very fatigued. I discussed this patient in detail and reviewed her EKG with Dr. Rayann Heman. He recommends stopping Norvasc. He'd also like her to come off the gabapentin if her primary care would agree to this. This could be contributing to her extreme fatigue, but most likely not to bradycardia. We did walk her around her and her heart rate came up to 82 beats per minute with moving around. I will see her back in 2 days to see if her heart rates comes up off the amlodipine. She is to go to the ER or she becomes presyncopal.

## 2013-10-30 NOTE — Patient Instructions (Addendum)
  Your physician recommends that you schedule a follow-up appointment in: [place]  Your physician recommends that you return for lab work in: TODAY BMET, CBC, TSH  Your physician has recommended you make the following change in your medication:   STOP YOUR AMLODIPINE   TALK TO YOUR PROVIDER IN STOPPING YOUR GABAPENTIN   NO DRIVING

## 2013-10-30 NOTE — Progress Notes (Signed)
HPI: This is an 80 year old female patient Dr. Luther Parody who has a history of non-STEMI in 2006 treated with drug-eluting stent to the LAD followup cath in 3/12 patent LAD stents with the jailed septal, 30% circumflex, 30% RCA. She has history of normal LV function with diastolic heart failure mild renal insufficiency last creatinine 1.33, hypertension, obesity, hyperlipidemia. She last saw Dr. Luther Parody in October 2014. She was then admitted to the hospital in November 2014 with chest pain. MI was ruled out. 2-D echo was done and showed normal systolic function with grade 1 diastolic dysfunction and nuclear stress test was done on 06/27/2013 and was negative for reversible or inducible ischemia, small fixed defect in the anterior septum extending into the mid ventricle to the apex ejection fraction 62%.  This patient complained of trouble with heart failure about a week ago. She became stressed out and got off her diet eating a lot of candy and food she should need. Her weight was up and she took Lasix and potassium for 4 days. The fluid resolved. She now complains of a one-month history of feeling like she is falling to the floor. She's had some dizziness without presyncope  but her main complaint is extreme fatigue and wanting to sleep all the time. She is bradycardicat a rate of 45 beats per minute and is not on any rate lowering drugs other than possibly Norvasc. She has not had this before.  Allergies -- Pravastatin -- Other (See Comments)   --  Muscle aches  -- Prednisone -- Nausea And Vomiting  Current Outpatient Prescriptions on File Prior to Visit: ALPRAZolam (XANAX) 0.25 MG tablet, Take 0.25 mg by mouth 3 (three) times daily as needed. anxiety, Disp: , Rfl:  amLODipine (NORVASC) 5 MG tablet, Take 5 mg by mouth daily., Disp: , Rfl:  aspirin 81 MG tablet, Take 81 mg by mouth daily., Disp: , Rfl:  clopidogrel (PLAVIX) 75 MG tablet, Take 75 mg by mouth daily., Disp: , Rfl:   estrogen-methylTESTOSTERone  0.625-1.25 MG per tablet, Take 1 tablet by mouth daily., Disp: , Rfl:  gabapentin (NEURONTIN) 300 MG capsule, Take 300 mg by mouth 2 (two) times daily., Disp: , Rfl:  levothyroxine (SYNTHROID, LEVOTHROID) 75 MCG tablet, Take 75 mcg by mouth daily., Disp: , Rfl:  lidocaine (LIDODERM) 5 %, Place 2 patches onto the skin daily. Remove & Discard patch within 12 hours or as directed by MD, Disp: , Rfl:  metFORMIN (GLUCOPHAGE) 1000 MG tablet, Take 1,000 mg by mouth 2 (two) times daily with a meal., Disp: , Rfl:  nitroGLYCERIN (NITROSTAT) 0.4 MG SL tablet, Place 1 tablet (0.4 mg total) under the tongue every 5 (five) minutes x 3 doses as needed for chest pain., Disp: 30 tablet, Rfl: 12 Oxycodone HCl 10 MG TABS, Take 10 mg by mouth 4 (four) times daily as needed (for pain). , Disp: , Rfl:  polyvinyl alcohol (LIQUIFILM TEARS) 1.4 % ophthalmic solution, Place 1 drop into both eyes as needed for dry eyes., Disp: , Rfl:  ramipril (ALTACE) 5 MG tablet, Take 5 mg by mouth daily., Disp: , Rfl:  valACYclovir (VALTREX) 500 MG tablet, Take 1,000 mg by mouth daily. , Disp: , Rfl:  vitamin B-12 (CYANOCOBALAMIN) 1000 MCG tablet, Take 1,000 mcg by mouth daily., Disp: , Rfl:  zolpidem (AMBIEN) 10 MG tablet, Take 10 mg by mouth at bedtime., Disp: , Rfl:   No current facility-administered medications on file prior to visit.   Past Medical History:   CAD (coronary artery  disease)                   02/12/2012      Comment:a. NSTEMI 2006:  2.7 x 28 mm Taxus stent to LAD              2006;  b.  Cath 3/12 Dr. Marlou Porch  Normal left               main, patent LAD stents with jailed septal, 30%              circumflex, 30% RCA     HTN (hypertension)                                           Obesity (BMI 30-39.9)                                        Chronic pain disorder                                        Hyperthyroidism                                              Hyperlipidemia                                                Lumbar disc disease                                          Bipolar affective disorder                                   Scoliosis                                                    Chronic diastolic heart failure                                Comment:a. Echo 7/13:  mod LVH, EF 65-70%, vigorous LV,              Gr 1 diast dysfn, mild LAE   IBS (irritable bowel syndrome)                               GERD (gastroesophageal reflux disease)                       Diabetes mellitus  Comment:, Type II   Peripheral neuropathy                                          Comment:, Associated with DM   Nonproliferative diabetic retinopathy associat*              DDD (degenerative disc disease), lumbosacral                   Comment:, Spinal stenosis   Non Q wave myocardial infarction                2005        Past Surgical History:   CHOLECYSTECTOMY                                               LAMINOTOMY                                      Right              DILATION AND CURETTAGE OF UTERUS                              PILONIDAL CYST EXCISION                                       ROTATOR CUFF REPAIR                                           BACK SURGERY                                                  CARDIAC CATHETERIZATION                                       TONSILLECTOMY                                                 INDUCED ABORTION                                                Comment:, x2   TOTAL ABDOMINAL HYSTERECTOMY W/ BILATERAL SALP*               LUMBAR [place] SURGERY                              07/1999  Comment:L5-S1   HEMILAMINOTOMY LUMBAR SPINE                      2002           Comment:Rt L5, with decompression and foraminotomy  Review of patient's family history indicates:   Coronary artery disease        Mother                   Heart failure                  Father                   Diabetes                        Father                   Social History   Marital Status: Divorced            Spouse Name:                      Years of Education:                 Number of children:             Occupational History   None on file  Social History Main Topics   Smoking Status: Former Smoker                   Packs/Day: 0.00  Years:         Smokeless Status: Not on file                      Alcohol Use: No                Comment: social drinking, not often   Drug Use: No             Sexual Activity: Not on file        Other Topics            Concern   None on file  Social History Narrative   Divorced.    ROS:   PHYSICAL EXAM: Well-nournished, in no acute distress. Neck: No JVD, HJR, Bruit, or thyroid enlargement  Lungs: No tachypnea, clear without wheezing, rales, or rhonchi  Cardiovascular: RRR, PMI not displaced, heart sounds normal, no murmurs, gallops, bruit, thrill, or heave.  Abdomen: BS normal. Soft without organomegaly, masses, lesions or tenderness.  Extremities: without cyanosis, clubbing or edema. Good distal pulses bilateral  SKin: Warm, no lesions or rashes   Musculoskeletal: No deformities  Neuro: no focal signs  BP 130/50  Pulse 47  Ht 4' 11.5" (1.511 m)  Wt 186 lb (84.369 kg)  BMI 36.95 kg/m2  SpO2 99%   EKG: Sinus bradycardia 45 beats per minute with LVH  2-D echo 06/26/13 Study Conclusions  - Left ventricle: The cavity size was normal. There was mild   focal basal hypertrophy of the septum. Systolic function   was normal. The estimated ejection fraction was in the   range of 60% to 65%. Wall motion was normal; there were no   regional wall motion abnormalities. There was an increased   relative contribution of atrial contraction to ventricular   filling. Doppler parameters are consistent with abnormal   left ventricular relaxation (grade 1  diastolic   dysfunction). - Mitral valve: Mild regurgitation. - Left atrium: The atrium was mildly  dilated. Transthoracic echocardiography.  . A nuclear stress test was done on November 25,2014 and was negative for reversible or inducible ischemia, small fixed defect in the anterior septum extending to the mid ventricle to the apex, ejection fraction 62%

## 2013-10-30 NOTE — Assessment & Plan Note (Signed)
The patient had some heart failure last week treated on her own with 4 days of Lasix. This has resolved. We'll check labs to make sure renal function and potassium are stable.

## 2013-10-31 LAB — CBC WITH DIFFERENTIAL/PLATELET
BASOS PCT: 0.8 % (ref 0.0–3.0)
Basophils Absolute: 0.1 10*3/uL (ref 0.0–0.1)
EOS PCT: 4.5 % (ref 0.0–5.0)
Eosinophils Absolute: 0.4 10*3/uL (ref 0.0–0.7)
HCT: 38.7 % (ref 36.0–46.0)
Hemoglobin: 12.4 g/dL (ref 12.0–15.0)
LYMPHS PCT: 30.4 % (ref 12.0–46.0)
Lymphs Abs: 2.7 10*3/uL (ref 0.7–4.0)
MCHC: 32 g/dL (ref 30.0–36.0)
MCV: 94.5 fl (ref 78.0–100.0)
MONOS PCT: 7.7 % (ref 3.0–12.0)
Monocytes Absolute: 0.7 10*3/uL (ref 0.1–1.0)
NEUTROS PCT: 56.6 % (ref 43.0–77.0)
Neutro Abs: 5 10*3/uL (ref 1.4–7.7)
Platelets: 244 10*3/uL (ref 150.0–400.0)
RBC: 4.09 Mil/uL (ref 3.87–5.11)
RDW: 15.8 % — ABNORMAL HIGH (ref 11.5–14.6)
WBC: 8.9 10*3/uL (ref 4.5–10.5)

## 2013-11-01 ENCOUNTER — Ambulatory Visit: Payer: Medicare Other | Admitting: Physician Assistant

## 2013-11-01 ENCOUNTER — Ambulatory Visit: Payer: Medicare Other | Admitting: Nurse Practitioner

## 2013-11-01 ENCOUNTER — Telehealth: Payer: Self-pay | Admitting: *Deleted

## 2013-11-01 ENCOUNTER — Encounter: Payer: Self-pay | Admitting: Physician Assistant

## 2013-11-01 ENCOUNTER — Ambulatory Visit (INDEPENDENT_AMBULATORY_CARE_PROVIDER_SITE_OTHER): Payer: Medicare Other | Admitting: Physician Assistant

## 2013-11-01 VITALS — BP 140/70 | HR 50 | Ht 59.5 in | Wt 186.0 lb

## 2013-11-01 DIAGNOSIS — R001 Bradycardia, unspecified: Secondary | ICD-10-CM

## 2013-11-01 DIAGNOSIS — I498 Other specified cardiac arrhythmias: Secondary | ICD-10-CM

## 2013-11-01 DIAGNOSIS — I509 Heart failure, unspecified: Secondary | ICD-10-CM

## 2013-11-01 DIAGNOSIS — I5033 Acute on chronic diastolic (congestive) heart failure: Secondary | ICD-10-CM

## 2013-11-01 NOTE — Telephone Encounter (Signed)
called pt and to inform her of her lab results. pt showed understanding of results

## 2013-11-01 NOTE — Progress Notes (Signed)
HPI: This is an 80 year old female patient Dr. Berniece Salines who I saw 2 days ago with symptoms of dizziness and extreme fatigue as well as bradycardia at 45 beats per minute. I reviewed her symptoms and medications with Dr. Rayann Heman who recommended we stop her Norvasc and she contact her neurologist to stop her gabapentin. He felt that was contributing to her fatigue. She is here today for followup on her bradycardia.  She is feeling mildly better. She is still extremely fatigued and feels she's climbing a hill constantly. She has an appointment with her primary care this afternoon to discuss stopping her gabapentin.    She has a history of non-STEMI in 2006 treated with drug-eluting stent to the LAD followup cath in 3/12 patent LAD stents with the jailed septal, 30% circumflex, 30% RCA. She has history of normal LV function with diastolic heart failure mild renal insufficiency last creatinine 1.33, hypertension, obesity, hyperlipidemia. She last saw Dr. Luther Parody in October 2014. She was then admitted to the hospital in November 2014 with chest pain. MI was ruled out. 2-D echo was done and showed normal systolic function with grade 1 diastolic dysfunction and nuclear stress test was done on 06/27/2013 and was negative for reversible or inducible ischemia, small fixed defect in the anterior septum extending into the mid ventricle to the apex ejection fraction 62%.  This patient complained of trouble with heart failure about a week ago. She became stressed out and got off her diet eating a lot of candy and food she should need. Her weight was up and she took Lasix and potassium for 4 days. The fluid resolved.    Allergies-- Pravastatin -- Other (See Comments)   --  Muscle aches  -- Prednisone -- Nausea And Vomiting  Current Outpatient Prescriptions on File Prior to Visit: ALPRAZolam (XANAX) 0.25 MG tablet, Take 0.25 mg by mouth 3 (three) times daily as needed. anxiety, Disp: , Rfl:  aspirin 81 MG tablet, Take 81  mg by mouth daily., Disp: , Rfl:  clopidogrel (PLAVIX) 75 MG tablet, Take 75 mg by mouth daily., Disp: , Rfl:   estrogen-methylTESTOSTERone 0.625-1.25 MG per tablet, Take 1 tablet by mouth daily., Disp: , Rfl:  gabapentin (NEURONTIN) 300 MG capsule, Take 300 mg by mouth 2 (two) times daily., Disp: , Rfl:  levothyroxine (SYNTHROID, LEVOTHROID) 75 MCG tablet, Take 75 mcg by mouth daily., Disp: , Rfl:  lidocaine (LIDODERM) 5 %, Place 2 patches onto the skin daily. Remove & Discard patch within 12 hours or as directed by MD, Disp: , Rfl:  Light Mineral Oil-Mineral Oil (RETAINE MGD OP), Apply to eye., Disp: , Rfl:   metFORMIN (GLUCOPHAGE) 1000 MG tablet, Take 1,000 mg by mouth 2 (two) times daily with a meal., Disp: , Rfl:  nitroGLYCERIN (NITROSTAT) 0.4 MG SL tablet, Place 1 tablet (0.4 mg total) under the tongue every 5 (five) minutes x 3 doses as needed for chest pain., Disp: 30 tablet, Rfl: 12 Oxycodone HCl 10 MG TABS, Take 10 mg by mouth 4 (four) times daily as needed (for pain). , Disp: , Rfl:  polyvinyl alcohol (LIQUIFILM TEARS) 1.4 % ophthalmic solution, Place 1 drop into both eyes as needed for dry eyes., Disp: , Rfl:  PREMARIN vaginal cream, , Disp: , Rfl:  ramipril (ALTACE) 5 MG tablet, Take 5 mg by mouth daily., Disp: , Rfl:  RESTASIS 0.05 % ophthalmic emulsion, , Disp: , Rfl:  valACYclovir (VALTREX) 500 MG tablet, Take 1,000 mg by mouth daily. , Disp: , Rfl:  vitamin B-12 (CYANOCOBALAMIN) 1000 MCG tablet, Take 1,000 mcg by mouth daily., Disp: , Rfl:  zolpidem (AMBIEN) 10 MG tablet, Take 10 mg by mouth at bedtime., Disp: , Rfl:   No current facility-administered medications on file prior to visit.   Past Medical History:   CAD (coronary artery disease)                   02/12/2012      Comment:a. NSTEMI 2006:  2.7 x 28 mm Taxus stent to LAD              2006;  b.  Cath 3/12 Dr. Marlou Porch  Normal left               main, patent LAD stents with jailed septal, 30%              circumflex,  30% RCA     HTN (hypertension)                                           Obesity (BMI 30-39.9)                                        Chronic pain disorder                                        Hyperthyroidism                                              Hyperlipidemia                                               Lumbar disc disease                                          Bipolar affective disorder                                   Scoliosis                                                    Chronic diastolic heart failure                                Comment:a. Echo 7/13:  mod LVH, EF 65-70%, vigorous LV,              Gr 1 diast dysfn, mild LAE   IBS (irritable bowel syndrome)  GERD (gastroesophageal reflux disease)                       Diabetes mellitus                                              Comment:, Type II   Peripheral neuropathy                                          Comment:, Associated with DM   Nonproliferative diabetic retinopathy associat*              DDD (degenerative disc disease), lumbosacral                   Comment:, Spinal stenosis   Non Q wave myocardial infarction                2005        Past Surgical History:   CHOLECYSTECTOMY                                               LAMINOTOMY                                      Right              DILATION AND CURETTAGE OF UTERUS                              PILONIDAL CYST EXCISION                                       ROTATOR CUFF REPAIR                                           BACK SURGERY                                                  CARDIAC CATHETERIZATION                                       TONSILLECTOMY                                                 INDUCED ABORTION  Comment:, x2   TOTAL ABDOMINAL HYSTERECTOMY W/ BILATERAL SALP*               LUMBAR [place] SURGERY                              07/1999         Comment:L5-S1   HEMILAMINOTOMY LUMBAR SPINE                      2002           Comment:Rt L5, with decompression and foraminotomy  Review of patient's family history indicates:   Coronary artery disease        Mother                   Heart failure                  Father                   Diabetes                       Father                   Social History   Marital Status: Divorced            Spouse Name:                      Years of Education:                 Number of children:             Occupational History   None on file  Social History Main Topics   Smoking Status: Former Smoker                   Packs/Day: 0.00  Years:         Smokeless Status: Not on file                      Alcohol Use: No                Comment: social drinking, not often   Drug Use: No             Sexual Activity: Not on file        Other Topics            Concern   None on file  Social History Narrative   Divorced.    ROS: Walks with a limp,See history of present illness otherwise negative   PHYSICAL EXAM: Well-nournished, in no acute distress. Neck: No JVD, HJR, Bruit, or thyroid enlargement  Lungs: No tachypnea, clear without wheezing, rales, or rhonchi  Cardiovascular: RRR, PMI not displaced, heart sounds normal, no murmurs, gallops, bruit, thrill, or heave.  Abdomen: BS normal. Soft without organomegaly, masses, lesions or tenderness.  Extremities: without cyanosis, clubbing or edema. Good distal pulses bilateral  SKin: Warm, no lesions or rashes   Musculoskeletal: No deformities  Neuro: no focal signs  BP 140/70  Pulse 50  Ht 4' 11.5" (1.511 m)  Wt 186 lb (84.369 kg)  BMI 36.95 kg/m2    EKG: Sinus bradycardia at 50 beats per minute with first degree AV block moderate LVH 2-D echo 06/26/13 Study Conclusions  - Left ventricle: The  cavity size was normal. There was mild   focal basal hypertrophy of the septum. Systolic function   was normal. The estimated  ejection fraction was in the   range of 60% to 65%. Wall motion was normal; there were no   regional wall motion abnormalities. There was an increased   relative contribution of atrial contraction to ventricular   filling. Doppler parameters are consistent with abnormal   left ventricular relaxation (grade 1 diastolic   dysfunction). - Mitral valve: Mild regurgitation. - Left atrium: The atrium was mildly dilated. Transthoracic echocardiography.  . A nuclear stress test was done on November 25,2014 and was negative for reversible or inducible ischemia, small fixed defect in the anterior septum extending to the mid ventricle to the apex, ejection fraction 62%

## 2013-11-01 NOTE — Assessment & Plan Note (Signed)
Currently stable.

## 2013-11-01 NOTE — Assessment & Plan Note (Addendum)
Patient continues to have sinus bradycardia despite stopping Norvasc, although it is somewhat improved. Her heart rate was actually 56 when she walked in here. 2 days ago we got it up to 80 with walking. She still feels extremely fatigued and like she's climbing a hill. I discussed this patient with Dr. Rayann Heman. We will check a 48-hour monitor and she will see Dr. Rayann Heman at the end of this month.She is to go to the emergency room if she has any dizziness or presyncopal symptoms.

## 2013-11-01 NOTE — Patient Instructions (Signed)
Your physician recommends that you schedule a follow-up appointment in: [address] [place]  Your physician recommends that you schedule a follow-up appointment in: WITH DR. ALLRED April 29TH, 2015  Your physician has recommended that you wear a 48 holter monitor. Holter monitors are medical devices that record the heart's electrical activity. Doctors most often use these monitors to diagnose arrhythmias. Arrhythmias are problems with the speed or rhythm of the heartbeat. The monitor is a small, portable device. You can wear one while you do your normal daily activities. This is usually used to diagnose what is causing palpitations/syncope (passing out).  Your physician recommends that you continue on your current medications as directed. Please refer to the Current Medication list given to you today.

## 2013-11-07 ENCOUNTER — Encounter: Payer: Self-pay | Admitting: *Deleted

## 2013-11-07 ENCOUNTER — Encounter (INDEPENDENT_AMBULATORY_CARE_PROVIDER_SITE_OTHER): Payer: Medicare Other

## 2013-11-07 DIAGNOSIS — I5033 Acute on chronic diastolic (congestive) heart failure: Secondary | ICD-10-CM

## 2013-11-07 DIAGNOSIS — I498 Other specified cardiac arrhythmias: Secondary | ICD-10-CM

## 2013-11-07 DIAGNOSIS — R001 Bradycardia, unspecified: Secondary | ICD-10-CM

## 2013-11-07 DIAGNOSIS — R42 Dizziness and giddiness: Secondary | ICD-10-CM

## 2013-11-07 NOTE — Progress Notes (Signed)
Patient ID: Kristine Oliver, female   DOB: July 13, 1931, 80 y.o.   MRN: [id number] EVO 48 hour holter monitor applied to patient.

## 2013-11-29 ENCOUNTER — Ambulatory Visit (INDEPENDENT_AMBULATORY_CARE_PROVIDER_SITE_OTHER): Payer: Medicare Other | Admitting: Internal Medicine

## 2013-11-29 ENCOUNTER — Encounter: Payer: Self-pay | Admitting: Internal Medicine

## 2013-11-29 ENCOUNTER — Encounter: Payer: Self-pay | Admitting: *Deleted

## 2013-11-29 VITALS — BP 135/58 | HR 56 | Ht 59.5 in | Wt 186.0 lb

## 2013-11-29 DIAGNOSIS — I498 Other specified cardiac arrhythmias: Secondary | ICD-10-CM

## 2013-11-29 DIAGNOSIS — R001 Bradycardia, unspecified: Secondary | ICD-10-CM

## 2013-11-29 NOTE — Progress Notes (Signed)
Primary Care Physician: Irven Shelling, MD   Kristine Oliver is a 80 y.o. female with a h/o hypertension, CAD, and hyperthyroidism who is referred today for symptomatic bradycardia.  All of her AVN agents have been held without significant improvement in heart rates.  She has worn a 48 hour holter monitor which demonstrated chronotropic incompetence and sinus pauses.  She states she also had dizziness while wearing the monitor, but no diary is available.   She has symptoms of fatigue, shortness of breath, and exercise intolerance.  She also has episodes of dizziness.  Last echo 02-2012 demonstrated EF 65-70% with moderate concentric hypertrophy.   Today, she denies symptoms of palpitations, chest pain, orthopnea, PND, lower extremity edema,  syncope, or neurologic sequela. The patient is tolerating medications without difficulties and is otherwise without complaint today.   Past Medical History  Diagnosis Date  . CAD (coronary artery disease) 02/12/2012    a. NSTEMI 2006:  2.7 x 28 mm Taxus stent to LAD 2006;  b.  Cath 3/12 Dr. Marlou Porch  Normal left main, patent LAD stents with jailed septal, 30% circumflex, 30% RCA    . HTN (hypertension)   . Obesity (BMI 30-39.9)   . Chronic pain disorder   . Hyperthyroidism   . Hyperlipidemia   . Lumbar disc disease   . Bipolar affective disorder   . Scoliosis   . Chronic diastolic heart failure     a. Echo 7/13:  mod LVH, EF 65-70%, vigorous LV, Gr 1 diast dysfn, mild LAE  . IBS (irritable bowel syndrome)   . GERD (gastroesophageal reflux disease)   . Diabetes mellitus     , Type II  . Peripheral neuropathy     , Associated with DM  . Nonproliferative diabetic retinopathy associated with type 2 diabetes mellitus   . DDD (degenerative disc disease), lumbosacral     , Spinal stenosis  . Non Q wave myocardial infarction 2005  . Bradycardia    Past Surgical History  Procedure Laterality Date  . Cholecystectomy    . Laminotomy Right   .  Dilation and curettage of uterus    . Pilonidal cyst excision    . Rotator cuff repair    . Back surgery    . Cardiac catheterization    . Tonsillectomy    . Induced abortion      , x2  . Total abdominal hysterectomy w/ bilateral salpingoophorectomy    . Lumbar disc surgery  07/1999    L5-S1  . Hemilaminotomy lumbar spine  2002    Rt L5, with decompression and foraminotomy    Current Outpatient Prescriptions  Medication Sig Dispense Refill  . ALPRAZolam (XANAX) 0.25 MG tablet Take 0.25 mg by mouth 3 (three) times daily as needed. anxiety      . antiseptic oral rinse (BIOTENE) LIQD 15 mLs by Mouth Rinse route as needed for dry mouth.      Marland Kitchen aspirin 81 MG tablet Take 81 mg by mouth daily.      . clindamycin (CLEOCIN T) 1 % lotion Apply 1 application topically daily.      . clopidogrel (PLAVIX) 75 MG tablet Take 75 mg by mouth daily.      Marland Kitchen estrogen-methylTESTOSTERone 0.625-1.25 MG per tablet Take 1 tablet by mouth daily.      Marland Kitchen gabapentin (NEURONTIN) 300 MG capsule Take 300 mg by mouth 2 (two) times daily.       Marland Kitchen levothyroxine (SYNTHROID, LEVOTHROID) 75 MCG tablet Take 75 mcg  by mouth daily.      Marland Kitchen lidocaine (LIDODERM) 5 % Place 2 patches onto the skin as needed. Remove & Discard patch within 12 hours or as directed by MD      . Sunday Corn Mineral Oil-Mineral Oil (RETAINE MGD OP) Apply to eye as directed.       . metFORMIN (GLUCOPHAGE) 1000 MG tablet Take 1,000 mg by mouth 2 (two) times daily with a meal.      . nitroGLYCERIN (NITROSTAT) 0.4 MG SL tablet Place 1 tablet (0.4 mg total) under the tongue every 5 (five) minutes x 3 doses as needed for chest pain.  30 tablet  12  . Oxycodone HCl 10 MG TABS Take 10 mg by mouth 4 (four) times daily as needed (for pain).       Marland Kitchen PREMARIN vaginal cream Place 1 Applicatorful vaginally as needed.       . ramipril (ALTACE) 5 MG tablet Take 5 mg by mouth daily.      . valACYclovir (VALTREX) 500 MG tablet Take 1,000 mg by mouth daily.       . vitamin  B-12 (CYANOCOBALAMIN) 1000 MCG tablet Take 1,000 mcg by mouth daily.      Marland Kitchen zolpidem (AMBIEN) 10 MG tablet Take 10 mg by mouth at bedtime.       No current facility-administered medications for this visit.    Allergies  Allergen Reactions  . Pravastatin Other (See Comments)    Muscle aches  . Prednisone Nausea And Vomiting    History   Social History  . Marital Status: Divorced    Spouse Name: N/A    Number of Children: N/A  . Years of Education: N/A   Occupational History  . Not on file.   Social History Main Topics  . Smoking status: Former Research scientist (life sciences)  . Smokeless tobacco: Not on file  . Alcohol Use: No     Comment: social drinking, not often  . Drug Use: No  . Sexual Activity: Not on file   Other Topics Concern  . Not on file   Social History Narrative   Divorced.    Family History  Problem Relation Age of Onset  . Coronary artery disease Mother   . Heart failure Father   . Diabetes Father     ROS- All systems are reviewed and negative except as per the HPI above  Physical Exam: Filed Vitals:   11/29/13 1545  BP: 135/58  Pulse: 56  Height: 4' 11.5" (1.511 m)  Weight: 186 lb (84.369 kg)    GEN- The patient is well appearing, alert and oriented x 3 today.   Head- normocephalic, atraumatic Eyes-  Sclera clear, conjunctiva pink Ears- hearing intact Oropharynx- clear Neck- supple, no JVP Lymph- no cervical lymphadenopathy Lungs- Clear to ausculation bilaterally, normal work of breathing Heart- Regular rate and rhythm, no murmurs, rubs or gallops, PMI not laterally displaced GI- soft, NT, ND, + BS Extremities- no clubbing, cyanosis, or edema MS- no significant deformity or atrophy Skin- no rash or lesion Psych- euthymic mood, full affect Neuro- strength and sensation are intact  Echo 06/26/13 reveals EF 60-65%, mild focal basal hypertrophy of the septum, mild MR ekg 11/01/13 reveals sinus bradycardia 50 bpm, PR 252, QRS 76, Qtc 364, LVH Holter monitor  11/07/13- sinus rhythm with frequent sinus bradycardia and sinus pauses, average HR 57, range 46-80  Assessment and Plan:   1.  Sick sinus syndrome The patient has symptomatic sinus bradycardia.  No reversible causes have  been found.  I would therefore recommend pacemaker implantation at this time.  Risks, benefits, alternatives to pacemaker implantation were discussed in detail with the patient today. The patient understands that the risks include but are not limited to bleeding, infection, pneumothorax, perforation, tamponade, vascular damage, renal failure, MI, stroke, death,  and lead dislodgement and wishes to proceed.  She wants to wait until after her [birth year] birthday.  I have encouraged her to go to the ER if she has syncope in the interim. We will therefore schedule the procedure at the next available time after 12/07/13 per her request.

## 2013-11-29 NOTE — Patient Instructions (Signed)
See instruction sheet for pacemaker implantatation

## 2013-11-30 ENCOUNTER — Other Ambulatory Visit: Payer: Self-pay | Admitting: *Deleted

## 2013-11-30 DIAGNOSIS — R001 Bradycardia, unspecified: Secondary | ICD-10-CM

## 2013-12-07 ENCOUNTER — Telehealth: Payer: Self-pay | Admitting: Cardiology

## 2013-12-07 NOTE — Telephone Encounter (Signed)
New message     Pt is scheduled to have a pacemaker put in on Monday---she said her pulse since may 1 has been good.  She does not need a pacemaker and she want to cancel the procedure

## 2013-12-10 MED ORDER — GENTAMICIN SULFATE 40 MG/ML IJ SOLN
80.0000 mg | INTRAMUSCULAR | Status: AC
  Filled 2013-12-10: qty 2

## 2013-12-10 MED ORDER — CEFAZOLIN SODIUM-DEXTROSE 2-3 GM-% IV SOLR: 2.0000 g | INTRAVENOUS | Status: AC

## 2013-12-11 ENCOUNTER — Ambulatory Visit (HOSPITAL_COMMUNITY): Admission: RE | Admit: 2013-12-11 | Payer: Medicare Other | Source: Ambulatory Visit | Admitting: Internal Medicine

## 2013-12-11 SURGERY — PERMANENT PACEMAKER INSERTION
Anesthesia: LOCAL

## 2013-12-14 NOTE — Telephone Encounter (Signed)
Patient canceled Pacemaker procedure

## 2013-12-14 NOTE — Telephone Encounter (Signed)
Thanks for the update. Let's make sure she has f/u in 6 months if not scheduled already.

## 2013-12-18 ENCOUNTER — Telehealth: Payer: Self-pay | Admitting: Cardiology

## 2013-12-18 NOTE — Telephone Encounter (Signed)
Monitor results. 

## 2014-01-03 ENCOUNTER — Ambulatory Visit (INDEPENDENT_AMBULATORY_CARE_PROVIDER_SITE_OTHER): Payer: Medicare Other | Admitting: Cardiology

## 2014-01-03 ENCOUNTER — Encounter: Payer: Self-pay | Admitting: Cardiology

## 2014-01-03 VITALS — BP 111/91 | HR 60 | Ht 59.5 in | Wt 183.6 lb

## 2014-01-03 DIAGNOSIS — E785 Hyperlipidemia, unspecified: Secondary | ICD-10-CM

## 2014-01-03 DIAGNOSIS — I2510 Atherosclerotic heart disease of native coronary artery without angina pectoris: Secondary | ICD-10-CM

## 2014-01-03 DIAGNOSIS — E1159 Type 2 diabetes mellitus with other circulatory complications: Secondary | ICD-10-CM

## 2014-01-03 DIAGNOSIS — I798 Other disorders of arteries, arterioles and capillaries in diseases classified elsewhere: Secondary | ICD-10-CM

## 2014-01-03 DIAGNOSIS — I5032 Chronic diastolic (congestive) heart failure: Secondary | ICD-10-CM

## 2014-01-03 NOTE — Patient Instructions (Signed)
Your physician wants you to follow-up in: [place] will receive a reminder letter in the mail two months in advance. If you don't receive a letter, please call our office to schedule the follow-up appointment.  Your physician recommends that you continue on your current medications as directed. Please refer to the Current Medication list given to you today.

## 2014-01-03 NOTE — Progress Notes (Signed)
[facility]. [address] [place], [place]  [address] Phone: [phone] Fax:  [phone]  Date:  01/03/2014   ID:  Ayak, Oller 1930/09/02, MRN [id number]  PCP:  Irven Shelling, MD   History of Present Illness: Kristine Oliver is a 80 y.o. female with diastolic heart failure, old myocardial infarction in 2006 treated with DES to LAD with followup catheterization in March of 2012 showing patent LAD stent with jailed septal, 30% circumflex, 30% RCA, normal EF, chronic kidney disease stage III with creatinine of 1.3, hypertension, obesity, hyperlipidemia with last nuclear stress test 06/27/13 negative for ischemia, EF 62% here for followup.  She was fairly bradycardic despite stopping amlodipine and she wore a 48-hour Holter monitor which demonstrated chronotropic incompetence as well as sinus pauses with average heart rate of 57 beats per minute ranging from 46-80. She had symptomatic sinus bradycardia and was requested to have pacemaker placed but she canceled her procedure.  She still having mild dyspnea on exertion but this is not severe. No syncope. No chest pain. She showed me an extensive collection of blood pressures and pulses at home. Mostly in the 81s and 60s. Reassuring.   Wt Readings from Last 3 Encounters:  01/03/14 183 lb 9.6 oz (83.28 kg)  11/29/13 186 lb (84.369 kg)  11/01/13 186 lb (84.369 kg)     Past Medical History  Diagnosis Date  . CAD (coronary artery disease) 02/12/2012    a. NSTEMI 2006:  2.7 x 28 mm Taxus stent to LAD 2006;  b.  Cath 3/12 Dr. Marlou Porch  Normal left main, patent LAD stents with jailed septal, 30% circumflex, 30% RCA    . HTN (hypertension)   . Obesity (BMI 30-39.9)   . Chronic pain disorder   . Hyperthyroidism   . Hyperlipidemia   . Lumbar disc disease   . Bipolar affective disorder   . Scoliosis   . Chronic diastolic heart failure     a. Echo 7/13:  mod LVH, EF 65-70%, vigorous LV, Gr 1 diast dysfn, mild LAE  . IBS  (irritable bowel syndrome)   . GERD (gastroesophageal reflux disease)   . Diabetes mellitus     , Type II  . Peripheral neuropathy     , Associated with DM  . Nonproliferative diabetic retinopathy associated with type 2 diabetes mellitus   . DDD (degenerative disc disease), lumbosacral     , Spinal stenosis  . Non Q wave myocardial infarction 2005  . Bradycardia     Past Surgical History  Procedure Laterality Date  . Cholecystectomy    . Laminotomy Right   . Dilation and curettage of uterus    . Pilonidal cyst excision    . Rotator cuff repair    . Back surgery    . Cardiac catheterization    . Tonsillectomy    . Induced abortion      , x2  . Total abdominal hysterectomy w/ bilateral salpingoophorectomy    . Lumbar disc surgery  07/1999    L5-S1  . Hemilaminotomy lumbar spine  2002    Rt L5, with decompression and foraminotomy    Current Outpatient Prescriptions  Medication Sig Dispense Refill  . ALPRAZolam (XANAX) 0.25 MG tablet Take 0.25 mg by mouth 3 (three) times daily as needed. anxiety      . antiseptic oral rinse (BIOTENE) LIQD 15 mLs by Mouth Rinse route as needed for dry mouth.      Marland Kitchen  aspirin 81 MG tablet Take 81 mg by mouth daily.      . clindamycin (CLEOCIN T) 1 % lotion Apply 1 application topically daily.      . clopidogrel (PLAVIX) 75 MG tablet Take 75 mg by mouth daily.      Marland Kitchen estrogen-methylTESTOSTERone 0.625-1.25 MG per tablet Take 1 tablet by mouth daily.      Marland Kitchen gabapentin (NEURONTIN) 300 MG capsule Take 300 mg by mouth 2 (two) times daily.       Marland Kitchen levothyroxine (SYNTHROID, LEVOTHROID) 75 MCG tablet Take 75 mcg by mouth daily.      Marland Kitchen lidocaine (LIDODERM) 5 % Place 2 patches onto the skin as needed. Remove & Discard patch within 12 hours or as directed by MD      . Sunday Corn Mineral Oil-Mineral Oil (RETAINE MGD OP) Apply to eye as directed.       . metFORMIN (GLUCOPHAGE) 1000 MG tablet Take 1,000 mg by mouth 2 (two) times daily with a meal.      .  nitroGLYCERIN (NITROSTAT) 0.4 MG SL tablet Place 1 tablet (0.4 mg total) under the tongue every 5 (five) minutes x 3 doses as needed for chest pain.  30 tablet  12  . Oxycodone HCl 10 MG TABS Take 10 mg by mouth 4 (four) times daily as needed (for pain).       Marland Kitchen PREMARIN vaginal cream Place 1 Applicatorful vaginally as needed.       . ramipril (ALTACE) 5 MG tablet Take 5 mg by mouth daily.      . valACYclovir (VALTREX) 500 MG tablet Take 1,000 mg by mouth daily.       . vitamin B-12 (CYANOCOBALAMIN) 1000 MCG tablet Take 1,000 mcg by mouth daily.      Marland Kitchen zolpidem (AMBIEN) 10 MG tablet Take 10 mg by mouth at bedtime.       No current facility-administered medications for this visit.    Allergies:    Allergies  Allergen Reactions  . Pravastatin Other (See Comments)    Muscle aches  . Prednisone Nausea And Vomiting    Social History:  The patient  reports that she has quit smoking. She does not have any smokeless tobacco history on file. She reports that she does not drink alcohol or use illicit drugs.   Family History  Problem Relation Age of Onset  . Coronary artery disease Mother   . Heart failure Father   . Diabetes Father     ROS:  Please see the history of present illness.   Denies any fevers, chills, orthopnea, PND   All other systems reviewed and negative.   PHYSICAL EXAM: VS:  BP 111/91  Pulse 60  Ht 4' 11.5" (1.511 m)  Wt 183 lb 9.6 oz (83.28 kg)  BMI 36.48 kg/m2 Well nourished, well developed, in no acute distress HEENT: normal, [place]/AT, EOMI Neck: no JVD, normal carotid upstroke, no bruit Cardiac:  normal S1, S2; RRR; no murmur Lungs:  clear to auscultation bilaterally, no wheezing, rhonchi or rales Abd: soft, nontender, no hepatomegaly, no bruits Ext: no edema, 2+ distal pulses Skin: warm and dry GU: deferred Neuro: no focal abnormalities noted, AAO x 3  EKG:  Previous sinus bradycardia 50s  ASSESSMENT AND PLAN:  1. Sinus bradycardia-opted not to go forward  with pacemaker. Avoiding all AV nodal blocking agents. Appreciate Dr. Rayann Heman consultation. 2. CAD-DES to LAD, moderate disease elsewhere. Catheterization in 2012-patent stent. Nuclear stress test 11/14, no ischemia. Normal ejection fraction.  Excellent. 3. Obesity-continue to encourage weight loss. She has not gained any weight. 4. 1 year follow up.  Signed, [name], MD [facility]  01/03/2014 5:00 PM

## 2014-07-19 ENCOUNTER — Ambulatory Visit (INDEPENDENT_AMBULATORY_CARE_PROVIDER_SITE_OTHER): Payer: Medicare Other | Admitting: Cardiology

## 2014-07-19 ENCOUNTER — Encounter: Payer: Self-pay | Admitting: Cardiology

## 2014-07-19 VITALS — BP 110/48 | HR 61 | Ht 59.5 in | Wt 177.0 lb

## 2014-07-19 DIAGNOSIS — I2583 Coronary atherosclerosis due to lipid rich plaque: Principal | ICD-10-CM

## 2014-07-19 DIAGNOSIS — I2510 Atherosclerotic heart disease of native coronary artery without angina pectoris: Secondary | ICD-10-CM

## 2014-07-19 DIAGNOSIS — R001 Bradycardia, unspecified: Secondary | ICD-10-CM

## 2014-07-19 DIAGNOSIS — E785 Hyperlipidemia, unspecified: Secondary | ICD-10-CM

## 2014-07-19 DIAGNOSIS — E669 Obesity, unspecified: Secondary | ICD-10-CM

## 2014-07-19 NOTE — Progress Notes (Signed)
[facility]. [address], [address] [place], [place]  [address] Phone: [phone] Fax:  [phone]  Date:  07/19/2014   ID:  Kristine, Oliver [birth year], MRN [id number]  PCP:  Irven Shelling, MD   History of Present Illness: Kristine Oliver is a 80 y.o. female with diastolic heart failure, old myocardial infarction in 2006 treated with DES to LAD with followup catheterization in March of 2012 showing patent LAD stent with jailed septal, 30% circumflex, 30% RCA, normal EF, chronic kidney disease stage III with creatinine of 1.3, hypertension, obesity, hyperlipidemia with last nuclear stress test 06/27/13 negative for ischemia, EF 62% here for followup.  She was fairly bradycardic despite stopping amlodipine and she wore a 48-hour Holter monitor which demonstrated chronotropic incompetence as well as sinus pauses with average heart rate of 57 beats per minute ranging from 46-80. She had symptomatic sinus bradycardia and was requested to have pacemaker placed but she canceled her procedure.  She still having mild dyspnea on exertion but this is not severe. No syncope. She will occasionally have "anginal symptoms ".   Wt Readings from Last 3 Encounters:  07/19/14 177 lb (80.287 kg)  01/03/14 183 lb 9.6 oz (83.28 kg)  11/29/13 186 lb (84.369 kg)     Past Medical History  Diagnosis Date  . CAD (coronary artery disease) 02/12/2012    a. NSTEMI 2006:  2.7 x 28 mm Taxus stent to LAD 2006;  b.  Cath 3/12 Dr. Marlou Porch  Normal left main, patent LAD stents with jailed septal, 30% circumflex, 30% RCA    . HTN (hypertension)   . Obesity (BMI 30-39.9)   . Chronic pain disorder   . Hyperthyroidism   . Hyperlipidemia   . Lumbar disc disease   . Bipolar affective disorder   . Scoliosis   . Chronic diastolic heart failure     a. Echo 7/13:  mod LVH, EF 65-70%, vigorous LV, Gr 1 diast dysfn, mild LAE  . IBS (irritable bowel syndrome)   . GERD (gastroesophageal reflux disease)   . Diabetes  mellitus     , Type II  . Peripheral neuropathy     , Associated with DM  . Nonproliferative diabetic retinopathy associated with type 2 diabetes mellitus   . DDD (degenerative disc disease), lumbosacral     , Spinal stenosis  . Non Q wave myocardial infarction 2005  . Bradycardia     Past Surgical History  Procedure Laterality Date  . Cholecystectomy    . Laminotomy Right   . Dilation and curettage of uterus    . Pilonidal cyst excision    . Rotator cuff repair    . Back surgery    . Cardiac catheterization    . Tonsillectomy    . Induced abortion      , x2  . Total abdominal hysterectomy w/ bilateral salpingoophorectomy    . Lumbar disc surgery  07/1999    L5-S1  . Hemilaminotomy lumbar spine  2002    Rt L5, with decompression and foraminotomy    Current Outpatient Prescriptions  Medication Sig Dispense Refill  . ALPRAZolam (XANAX) 0.25 MG tablet Take 0.25 mg by mouth 3 (three) times daily as needed. anxiety    . antiseptic oral rinse (BIOTENE) LIQD 15 mLs by Mouth Rinse route as needed for dry mouth.    Marland Kitchen aspirin 81 MG tablet Take 81 mg by mouth daily.    . clindamycin (CLEOCIN T) 1 % lotion  Apply 1 application topically daily.    . clopidogrel (PLAVIX) 75 MG tablet Take 75 mg by mouth daily.    Marland Kitchen estrogen-methylTESTOSTERone 0.625-1.25 MG per tablet Take 1 tablet by mouth daily.    Marland Kitchen gabapentin (NEURONTIN) 300 MG capsule Take 300 mg by mouth 2 (two) times daily.     Marland Kitchen levothyroxine (SYNTHROID, LEVOTHROID) 75 MCG tablet Take 75 mcg by mouth daily.    Marland Kitchen lidocaine (LIDODERM) 5 % Place 2 patches onto the skin as needed. Remove & Discard patch within 12 hours or as directed by MD    . Sunday Corn Mineral Oil-Mineral Oil (RETAINE MGD OP) Apply to eye as directed.     . metFORMIN (GLUCOPHAGE) 1000 MG tablet Take 1,000 mg by mouth 2 (two) times daily with a meal.    . nitroGLYCERIN (NITROSTAT) 0.4 MG SL tablet Place 1 tablet (0.4 mg total) under the tongue every 5 (five) minutes x 3  doses as needed for chest pain. 30 tablet 12  . Oxycodone HCl 10 MG TABS Take 10 mg by mouth 4 (four) times daily as needed (for pain).     Marland Kitchen PREMARIN vaginal cream Place 1 Applicatorful vaginally as needed.     . ramipril (ALTACE) 5 MG tablet Take 5 mg by mouth daily.    . valACYclovir (VALTREX) 500 MG tablet Take 1,000 mg by mouth daily.     . vitamin B-12 (CYANOCOBALAMIN) 1000 MCG tablet Take 1,000 mcg by mouth daily.    Marland Kitchen zolpidem (AMBIEN) 10 MG tablet Take 10 mg by mouth at bedtime.     No current facility-administered medications for this visit.    Allergies:    Allergies  Allergen Reactions  . Pravastatin Other (See Comments)    Muscle aches  . Prednisone Nausea And Vomiting    Social History:  The patient  reports that she has quit smoking. She does not have any smokeless tobacco history on file. She reports that she does not drink alcohol or use illicit drugs.   Family History  Problem Relation Age of Onset  . Coronary artery disease Mother   . Heart failure Father   . Diabetes Father     ROS:  Please see the history of present illness.   Denies any fevers, chills, orthopnea, PND   All other systems reviewed and negative.   PHYSICAL EXAM: VS:  BP 110/48 mmHg  Pulse 61  Ht 4' 11.5" (1.511 m)  Wt 177 lb (80.287 kg)  BMI 35.17 kg/m2 Well nourished, well developed, in no acute distress HEENT: normal, [place]/AT, EOMI Neck: no JVD, normal carotid upstroke, no bruit Cardiac:  normal S1, S2; RRR; no murmur Lungs:  clear to auscultation bilaterally, no wheezing, rhonchi or rales Abd: soft, nontender, no hepatomegaly, no bruits Ext: no edema, 2+ distal pulses Skin: warm and dry GU: deferred Neuro: no focal abnormalities noted, AAO x 3  EKG:  Previous sinus bradycardia 50s  ASSESSMENT AND PLAN:  1. Sinus bradycardia-opted not to go forward with pacemaker. Avoiding all AV nodal blocking agents. Appreciate Dr. Rayann Heman consultation. 2. CAD-DES to LAD, moderate disease  elsewhere. Catheterization in 2012-patent stent. Nuclear stress test 11/14, no ischemia. Normal ejection fraction. Excellent. 2 angina attacks recently.  3. Obesity-continue to encourage weight loss. She has done a good job. She states that in her 63s she was very active, [employer] member, lifted weights, 45 pound free weight curls, hurt her back and then could not walk country [employer]. 4. 6 month follow  up.  Signed, [name], MD [facility]  07/19/2014 11:02 AM

## 2014-07-19 NOTE — Patient Instructions (Signed)
Your current medication is effective.   Your physician would like you to follow up in 6 Months with Dr. Marlou Porch. You will receive a letter in the mail 2 months in advance to call and schedule this appointment.   Thank you for choosing [facility]

## 2014-09-21 ENCOUNTER — Telehealth: Payer: Self-pay | Admitting: Cardiology

## 2014-09-21 NOTE — Telephone Encounter (Signed)
Pt c/o Shortness Of Breath: STAT if SOB developed within the last 24 hours or pt is noticeably SOB on the phone Angina Attacks 1. Are you currently SOB (can you hear that pt is SOB on the phone)? Not on the phone  2. How long have you been experiencing SOB? For several weeks now.. About 3 weeks 3. Are you SOB when sitting or when up moving around? Moving around (Whenever she is in transition to leave the house she has an attack and she sleeps all of the time) 4.  Are you currently experiencing any other symptoms? Pain in her left arm slightly nauseated,

## 2014-09-21 NOTE — Telephone Encounter (Signed)
Spoke with pt who is complaining of increased frequency of angina - reporting 3 attacks this week.  These episodes are relieved with 3 SL ntg.  She states she is sleeping all the time, very fatigued and SOB with any activity.  She denies any edema or changes in wt, in fact she reports her wt is actually down.  Offered her an appointment for Monday 2/22 am and she declined stating it was too early in the day and that would cause her to have angina trying to get here.  She agreed to an appointment on Wednesday at 3:45 pm with Dr Marlou Porch.  She will call back prior to then if further concerns.

## 2014-09-26 ENCOUNTER — Encounter: Payer: Self-pay | Admitting: Cardiology

## 2014-09-26 ENCOUNTER — Ambulatory Visit (INDEPENDENT_AMBULATORY_CARE_PROVIDER_SITE_OTHER): Payer: Medicare Other | Admitting: Cardiology

## 2014-09-26 VITALS — BP 158/72 | HR 59 | Ht 59.25 in | Wt 171.4 lb

## 2014-09-26 DIAGNOSIS — E785 Hyperlipidemia, unspecified: Secondary | ICD-10-CM

## 2014-09-26 DIAGNOSIS — I2583 Coronary atherosclerosis due to lipid rich plaque: Principal | ICD-10-CM

## 2014-09-26 DIAGNOSIS — R001 Bradycardia, unspecified: Secondary | ICD-10-CM

## 2014-09-26 DIAGNOSIS — I2510 Atherosclerotic heart disease of native coronary artery without angina pectoris: Secondary | ICD-10-CM

## 2014-09-26 NOTE — Patient Instructions (Signed)
Your physician recommends that you continue on your current medications as directed. Please refer to the Current Medication list given to you today.  Your physician wants you to follow-up in: 6 months. You will receive a reminder letter in the mail two months in advance. If you don't receive a letter, please call our office to schedule the follow-up appointment.  

## 2014-09-26 NOTE — Progress Notes (Signed)
[facility]. [address], [place]  [address] Phone: [phone] Fax:  [phone]  Date:  09/26/2014   ID:  Antoninette, Mccoppin 1931-05-01, MRN [id number]  PCP:  Irven Shelling, MD   History of Present Illness: Kristine Oliver is a 81 y.o. female with diastolic heart failure, old myocardial infarction in 2006 treated with DES to LAD with followup catheterization in March of 2012 showing patent LAD stent with jailed septal, 30% circumflex, 30% RCA, normal EF, chronic kidney disease stage III with creatinine of 1.3, hypertension, obesity, hyperlipidemia with last nuclear stress test 06/27/13 negative for ischemia, EF 62% here for followup.  Her main complaint today was insomnia. She is having trouble sleeping through the night. If she gets good rest, she feels quite well for the most part through the day. If she does not get good rest, she states that occasionally she may have an anginal episode. Nitroglycerin always seems to help. She takes half of an Ambien and half of the Xanax.  She was fairly bradycardic despite stopping amlodipine and she wore a 48-hour Holter monitor which demonstrated chronotropic incompetence as well as sinus pauses with average heart rate of 57 beats per minute ranging from 46-80. She had symptomatic sinus bradycardia and was requested to have pacemaker placed but she canceled her procedure.  She still having mild dyspnea on exertion but this is not severe. No syncope. She will occasionally have "anginal symptoms ". She is very happy about having her rolling walker that she sit on.   Wt Readings from Last 3 Encounters:  09/26/14 171 lb 6.4 oz (77.747 kg)  07/19/14 177 lb (80.287 kg)  01/03/14 183 lb 9.6 oz (83.28 kg)     Past Medical History  Diagnosis Date  . CAD (coronary artery disease) 02/12/2012    a. NSTEMI 2006:  2.7 x 28 mm Taxus stent to LAD 2006;  b.  Cath 3/12 Dr. Marlou Porch  Normal left main, patent LAD stents with jailed septal,  30% circumflex, 30% RCA    . HTN (hypertension)   . Obesity (BMI 30-39.9)   . Chronic pain disorder   . Hyperthyroidism   . Hyperlipidemia   . Lumbar disc disease   . Bipolar affective disorder   . Scoliosis   . Chronic diastolic heart failure     a. Echo 7/13:  mod LVH, EF 65-70%, vigorous LV, Gr 1 diast dysfn, mild LAE  . IBS (irritable bowel syndrome)   . GERD (gastroesophageal reflux disease)   . Diabetes mellitus     , Type II  . Peripheral neuropathy     , Associated with DM  . Nonproliferative diabetic retinopathy associated with type 2 diabetes mellitus   . DDD (degenerative disc disease), lumbosacral     , Spinal stenosis  . Non Q wave myocardial infarction 2005  . Bradycardia     Past Surgical History  Procedure Laterality Date  . Cholecystectomy    . Laminotomy Right   . Dilation and curettage of uterus    . Pilonidal cyst excision    . Rotator cuff repair    . Back surgery    . Cardiac catheterization    . Tonsillectomy    . Induced abortion      , x2  . Total abdominal hysterectomy w/ bilateral salpingoophorectomy    . Lumbar disc surgery  07/1999    L5-S1  . Hemilaminotomy lumbar spine  2002    Rt  L5, with decompression and foraminotomy    Current Outpatient Prescriptions  Medication Sig Dispense Refill  . ALPRAZolam (XANAX) 0.25 MG tablet Take 0.25 mg by mouth 3 (three) times daily as needed. anxiety    . antiseptic oral rinse (BIOTENE) LIQD 15 mLs by Mouth Rinse route as needed for dry mouth.    Marland Kitchen aspirin 81 MG tablet Take 81 mg by mouth daily.    . clindamycin (CLEOCIN T) 1 % lotion Apply 1 application topically daily.    . clopidogrel (PLAVIX) 75 MG tablet Take 75 mg by mouth daily.    Marland Kitchen estrogen-methylTESTOSTERone 0.625-1.25 MG per tablet Take 1 tablet by mouth daily.    Marland Kitchen gabapentin (NEURONTIN) 300 MG capsule Take 300 mg by mouth 2 (two) times daily.     Marland Kitchen levothyroxine (SYNTHROID, LEVOTHROID) 75 MCG tablet Take 75 mcg by mouth daily.    Marland Kitchen  lidocaine (LIDODERM) 5 % Place 2 patches onto the skin as needed. Remove & Discard patch within 12 hours or as directed by MD    . Sunday Corn Mineral Oil-Mineral Oil (RETAINE MGD OP) Apply to eye as directed.     . metFORMIN (GLUCOPHAGE) 1000 MG tablet Take 1,000 mg by mouth 2 (two) times daily with a meal.    . nitroGLYCERIN (NITROSTAT) 0.4 MG SL tablet Place 1 tablet (0.4 mg total) under the tongue every 5 (five) minutes x 3 doses as needed for chest pain. 30 tablet 12  . Oxycodone HCl 10 MG TABS Take 10 mg by mouth 4 (four) times daily as needed (for pain).     Marland Kitchen PREMARIN vaginal cream Place 1 Applicatorful vaginally as needed.     . ramipril (ALTACE) 5 MG tablet Take 5 mg by mouth daily.    . valACYclovir (VALTREX) 500 MG tablet Take 1,000 mg by mouth daily.     . vitamin B-12 (CYANOCOBALAMIN) 1000 MCG tablet Take 1,000 mcg by mouth daily.    Marland Kitchen zolpidem (AMBIEN) 10 MG tablet Take 10 mg by mouth at bedtime.    Marland Kitchen oxyCODONE (OXY IR/ROXICODONE) 5 MG immediate release tablet Take 5 mg by mouth at bedtime. As needed for pain  0  . ramipril (ALTACE) 5 MG capsule Take 5 mg by mouth daily.  1   No current facility-administered medications for this visit.    Allergies:    Allergies  Allergen Reactions  . Pravastatin Other (See Comments)    Muscle aches  . Prednisone Nausea And Vomiting    Social History:  The patient  reports that she has quit smoking. She does not have any smokeless tobacco history on file. She reports that she does not drink alcohol or use illicit drugs.   Family History  Problem Relation Age of Onset  . Coronary artery disease Mother   . Heart failure Father   . Diabetes Father     ROS:  Please see the history of present illness.   Denies any fevers, chills, orthopnea, PND   All other systems reviewed and negative.   PHYSICAL EXAM: VS:  BP 158/72 mmHg  Pulse 59  Ht 4' 11.25" (1.505 m)  Wt 171 lb 6.4 oz (77.747 kg)  BMI 34.33 kg/m2  SpO2 95% Well nourished, well  developed, in no acute distress HEENT: normal, [place]/AT, EOMI Neck: no JVD, normal carotid upstroke, no bruit Cardiac:  normal S1, S2; RRR; no murmur Lungs:  clear to auscultation bilaterally, no wheezing, rhonchi or rales Abd: soft, nontender, no hepatomegaly, no bruits Ext: no  edema, 2+ distal pulses Skin: warm and dry GU: deferred Neuro: no focal abnormalities noted, AAO x 3  EKG:  Previous sinus bradycardia 50s  ASSESSMENT AND PLAN:  1. Sinus bradycardia-opted not to go forward with pacemaker. Avoiding all AV nodal blocking agents. Appreciate Dr. Rayann Heman consultation. Seems stable, no syncopal symptoms. 2. CAD-DES to LAD, moderate disease elsewhere. Catheterization in 2012-patent stent. Nuclear stress test 11/14, no ischemia. Normal ejection fraction. Excellent. 3 angina attacks recently. We will continue to follow. Continue use nitroglycerin on as-needed basis. 3. Obesity-continue to encourage weight loss. She has done a good job. She states that in her 31s she was very active, [employer] club member, lifted weights, 45 pound free weight curls, hurt her back and then could not walk country [employer].  4. 6 month follow up.  Signed, [name], MD [facility]  09/26/2014 4:34 PM

## 2015-04-16 ENCOUNTER — Ambulatory Visit: Payer: Medicare Other | Admitting: Cardiology

## 2015-04-17 ENCOUNTER — Encounter: Payer: Self-pay | Admitting: Cardiology

## 2015-05-20 ENCOUNTER — Ambulatory Visit: Payer: Medicare Other | Admitting: Cardiology

## 2015-05-28 ENCOUNTER — Encounter: Payer: Self-pay | Admitting: Cardiology

## 2015-05-28 ENCOUNTER — Ambulatory Visit: Payer: Medicare Other | Admitting: Cardiology

## 2015-05-29 ENCOUNTER — Encounter: Payer: Self-pay | Admitting: Cardiology

## 2015-07-01 ENCOUNTER — Other Ambulatory Visit: Payer: Self-pay | Admitting: Gastroenterology

## 2015-07-01 DIAGNOSIS — R1310 Dysphagia, unspecified: Secondary | ICD-10-CM

## 2015-07-05 ENCOUNTER — Inpatient Hospital Stay: Admission: RE | Admit: 2015-07-05 | Payer: Medicare Other | Source: Ambulatory Visit

## 2015-07-12 ENCOUNTER — Other Ambulatory Visit: Payer: Medicare Other

## 2015-08-13 ENCOUNTER — Other Ambulatory Visit: Payer: Self-pay | Admitting: Gastroenterology

## 2015-08-13 DIAGNOSIS — R1310 Dysphagia, unspecified: Secondary | ICD-10-CM

## 2015-08-19 ENCOUNTER — Ambulatory Visit
Admission: RE | Admit: 2015-08-19 | Discharge: 2015-08-19 | Disposition: A | Discharge status: Home / Self Care | Payer: Medicare Other | Source: Ambulatory Visit | Attending: Gastroenterology | Admitting: Gastroenterology

## 2015-08-19 DIAGNOSIS — R1310 Dysphagia, unspecified: Secondary | ICD-10-CM

## 2015-11-18 ENCOUNTER — Telehealth: Payer: Self-pay | Admitting: Cardiology

## 2015-11-18 DIAGNOSIS — Z79899 Other long term (current) drug therapy: Secondary | ICD-10-CM

## 2015-11-18 DIAGNOSIS — R609 Edema, unspecified: Secondary | ICD-10-CM

## 2015-11-18 MED ORDER — FUROSEMIDE 20 MG PO TABS: 20.0000 mg | ORAL_TABLET | Freq: Every day | ORAL | Status: DC

## 2015-11-18 NOTE — Telephone Encounter (Signed)
Pt aware of orders and will start Furosemide tomorrow.  She will weigh daily and have blood work in 1 week.

## 2015-11-18 NOTE — Telephone Encounter (Signed)
Spoke with pt who is reporting her wt is up 4 lbs since Saturday.  She has been feeling some SOB and a lot of fatigue recently.  Her feet and ankles are swelling and she can see the indention from her socks on her skin.  She has been eating salt but states she has done this all her life and has never been a problem for her in the past.  Advised to decrease NA intake and elevate feet and legs as much as possible.  She used to take Furosemide (in 2013) but has not been on any type of diuretics recently.  Advised I will review with MD.  She is also asking about getting her thyroid checked or to have her Vit B levels checked.  Advised she should f/u with PCP about those concerns.  Pt is aware I will call back once this information has been reviewed by MD.

## 2015-11-18 NOTE — Telephone Encounter (Signed)
Pt c/o swelling: STAT is pt has developed SOB within 24 hours  1. How long have you been experiencing swelling? Since Yesterday   2. Where is the swelling located?Both Legs  3.  Are you currently taking a "fluid pill"?no  4.  Are you currently SOB? Yes a little   5.  Have you traveled recently?no  Have gained 4lbs since Saturday

## 2015-11-18 NOTE — Telephone Encounter (Signed)
N/A and mailbox is full.

## 2015-11-18 NOTE — Telephone Encounter (Signed)
Start Lasix 20 mg once a day. Daily weights. Have her check basic metabolic profile in one week.  [name], MD

## 2015-11-25 ENCOUNTER — Encounter: Payer: Self-pay | Admitting: Cardiology

## 2015-11-25 ENCOUNTER — Other Ambulatory Visit (INDEPENDENT_AMBULATORY_CARE_PROVIDER_SITE_OTHER): Payer: Medicare Other | Admitting: *Deleted

## 2015-11-25 ENCOUNTER — Ambulatory Visit (INDEPENDENT_AMBULATORY_CARE_PROVIDER_SITE_OTHER): Payer: Medicare Other | Admitting: Cardiology

## 2015-11-25 VITALS — BP 128/80 | HR 56 | Ht 59.5 in | Wt 173.8 lb

## 2015-11-25 DIAGNOSIS — I2510 Atherosclerotic heart disease of native coronary artery without angina pectoris: Secondary | ICD-10-CM | POA: Diagnosis not present

## 2015-11-25 DIAGNOSIS — I5032 Chronic diastolic (congestive) heart failure: Secondary | ICD-10-CM

## 2015-11-25 DIAGNOSIS — E785 Hyperlipidemia, unspecified: Secondary | ICD-10-CM

## 2015-11-25 DIAGNOSIS — Z79899 Other long term (current) drug therapy: Secondary | ICD-10-CM | POA: Diagnosis not present

## 2015-11-25 DIAGNOSIS — E669 Obesity, unspecified: Secondary | ICD-10-CM | POA: Diagnosis not present

## 2015-11-25 DIAGNOSIS — R001 Bradycardia, unspecified: Secondary | ICD-10-CM | POA: Diagnosis not present

## 2015-11-25 DIAGNOSIS — I2583 Coronary atherosclerosis due to lipid rich plaque: Secondary | ICD-10-CM

## 2015-11-25 DIAGNOSIS — R609 Edema, unspecified: Secondary | ICD-10-CM

## 2015-11-25 LAB — BASIC METABOLIC PANEL
BUN: 18 mg/dL (ref 7–25)
CALCIUM: 10.2 mg/dL (ref 8.6–10.4)
CO2: 25 mmol/L (ref 20–31)
CREATININE: 1.21 mg/dL — AB (ref 0.60–0.88)
Chloride: 105 mmol/L (ref 98–110)
Glucose, Bld: 90 mg/dL (ref 65–99)
Potassium: 4.6 mmol/L (ref 3.5–5.3)
SODIUM: 140 mmol/L (ref 135–146)

## 2015-11-25 NOTE — Patient Instructions (Signed)

## 2015-11-25 NOTE — Progress Notes (Signed)
[facility]. [address] [place], [place]  [address] Phone: [phone] Fax:  [phone]  Date:  11/25/2015   ID:  Oliver, Kristine Apr 09, 1931, MRN [id number]  PCP:  Irven Shelling, MD   History of Present Illness: Kristine Oliver is a 82 y.o. female with diastolic heart failure, old myocardial infarction in 2006 treated with DES to LAD with followup catheterization in March of 2012 showing patent LAD stent with jailed septal, 30% circumflex, 30% RCA, normal EF, chronic kidney disease stage III with creatinine of 1.3, hypertension, obesity, hyperlipidemia with last nuclear stress test 06/27/13 negative for ischemia, EF 62% here for followup.  Her main complaint today was fatigue. Previously it was insomnia. She is having trouble sleeping through the night. If she gets good rest, she feels quite well for the most part through the day. If she does not get good rest, she states that occasionally she may have an anginal episode. Nitroglycerin always seems to help. She takes half of an Ambien and half of the Xanax.  She was fairly bradycardic despite stopping amlodipine and she wore a 48-hour Holter monitor which demonstrated chronotropic incompetence as well as sinus pauses with average heart rate of 57 beats per minute ranging from 46-80. She had symptomatic sinus bradycardia and was requested to have pacemaker placed but she canceled her procedure.  She still having mild dyspnea on exertion but this is not severe. No syncope. She will occasionally have "anginal symptoms ". She is very happy about having her rolling walker that she sit on. Walks in the park.   Used to lift 45 pound free weights.   She knows she does not have sleep apnea (never had sleep study). She is very proud about her knowledge base. She admitted that she was eating salty potato chips with for proximal 1 month and gained 5 pounds. She was having some swelling in her legs.  Wt Readings from Last 3  Encounters:  11/25/15 173 lb 12.8 oz (78.835 kg)  09/26/14 171 lb 6.4 oz (77.747 kg)  07/19/14 177 lb (80.287 kg)     Past Medical History  Diagnosis Date  . CAD (coronary artery disease) 02/12/2012    a. NSTEMI 2006:  2.7 x 28 mm Taxus stent to LAD 2006;  b.  Cath 3/12 Dr. Marlou Porch  Normal left main, patent LAD stents with jailed septal, 30% circumflex, 30% RCA    . HTN (hypertension)   . Obesity (BMI 30-39.9)   . Chronic pain disorder   . Hyperthyroidism   . Hyperlipidemia   . Lumbar disc disease   . Bipolar affective disorder ([place])   . Scoliosis   . Chronic diastolic heart failure ([place])     a. Echo 7/13:  mod LVH, EF 65-70%, vigorous LV, Gr 1 diast dysfn, mild LAE  . IBS (irritable bowel syndrome)   . GERD (gastroesophageal reflux disease)   . Diabetes mellitus ([place])     , Type II  . Peripheral neuropathy ([facility])     , Associated with DM  . Nonproliferative diabetic retinopathy associated with type 2 diabetes mellitus ([place])   . DDD (degenerative disc disease), lumbosacral     , Spinal stenosis  . Non Q wave myocardial infarction ([place]) 2005  . Bradycardia     Past Surgical History  Procedure Laterality Date  . Cholecystectomy    . Laminotomy Right   . Dilation and curettage of uterus    . Pilonidal cyst  excision    . Rotator cuff repair    . Back surgery    . Cardiac catheterization    . Tonsillectomy    . Induced abortion      , x2  . Total abdominal hysterectomy w/ bilateral salpingoophorectomy    . Lumbar disc surgery  07/1999    L5-S1  . Hemilaminotomy lumbar spine  2002    Rt L5, with decompression and foraminotomy    Current Outpatient Prescriptions  Medication Sig Dispense Refill  . ALPRAZolam (XANAX) 0.25 MG tablet Take 0.25 mg by mouth 3 (three) times daily as needed. anxiety    . antiseptic oral rinse (BIOTENE) LIQD 15 mLs by Mouth Rinse route as needed for dry mouth.    Marland Kitchen aspirin 81 MG tablet Take 81 mg by mouth daily.    . clindamycin (CLEOCIN T) 1 %  lotion Apply 1 application topically daily.    . clopidogrel (PLAVIX) 75 MG tablet Take 75 mg by mouth daily.    Marland Kitchen estrogen-methylTESTOSTERone 0.625-1.25 MG per tablet Take 1 tablet by mouth daily.    . furosemide (LASIX) 20 MG tablet Take 1 tablet (20 mg total) by mouth daily. 30 tablet 6  . gabapentin (NEURONTIN) 300 MG capsule Take 300 mg by mouth 2 (two) times daily.     Marland Kitchen levothyroxine (SYNTHROID, LEVOTHROID) 75 MCG tablet Take 75 mcg by mouth daily.    Marland Kitchen lidocaine (LIDODERM) 5 % Place 2 patches onto the skin as needed. Remove & Discard patch within 12 hours or as directed by MD    . Sunday Corn Mineral Oil-Mineral Oil (RETAINE MGD OP) Apply to eye as directed.     . metFORMIN (GLUCOPHAGE) 1000 MG tablet Take 1,000 mg by mouth 2 (two) times daily with a meal.    . metFORMIN (GLUCOPHAGE) 500 MG tablet   1  . nitroGLYCERIN (NITROSTAT) 0.4 MG SL tablet Place 1 tablet (0.4 mg total) under the tongue every 5 (five) minutes x 3 doses as needed for chest pain. 30 tablet 12  . oxyCODONE (OXY IR/ROXICODONE) 5 MG immediate release tablet Take 5 mg by mouth at bedtime. As needed for pain  0  . Oxycodone HCl 10 MG TABS Take 1 tablet by mouth 4 (four) times daily.  0  . PREMARIN vaginal cream Place 1 Applicatorful vaginally as needed.     . ramipril (ALTACE) 5 MG capsule Take 5 mg by mouth daily.  1  . ramipril (ALTACE) 5 MG tablet Take 5 mg by mouth daily.    . valACYclovir (VALTREX) 500 MG tablet Take 1,000 mg by mouth daily.     . vitamin B-12 (CYANOCOBALAMIN) 1000 MCG tablet Take 1,000 mcg by mouth daily.    Marland Kitchen zolpidem (AMBIEN) 10 MG tablet Take 10 mg by mouth at bedtime.     No current facility-administered medications for this visit.    Allergies:    Allergies  Allergen Reactions  . Pravastatin Other (See Comments)    Muscle aches  . Prednisone Nausea And Vomiting    Social History:  The patient  reports that she has quit smoking. She does not have any smokeless tobacco history on file. She  reports that she does not drink alcohol or use illicit drugs.   Family History  Problem Relation Age of Onset  . Coronary artery disease Mother   . Heart failure Father   . Diabetes Father     ROS:  Please see the history of present illness.   Denies  any fevers, chills, orthopnea, PND   All other systems reviewed and negative.   PHYSICAL EXAM: VS:  BP 128/80 mmHg  Pulse 56  Ht 4' 11.5" (1.511 m)  Wt 173 lb 12.8 oz (78.835 kg)  BMI 34.53 kg/m2 Well nourished, well developed, in no acute distress HEENT: normal, [place]/AT, EOMI Neck: no JVD, normal carotid upstroke, no bruit Cardiac:  normal S1, S2; RRR; no murmur Lungs:  clear to auscultation bilaterally, no wheezing, rhonchi or rales Abd: soft, nontender, no hepatomegaly, no bruits Ext: no edema, 2+ distal pulses Skin: warm and dry GU: deferred Neuro: no focal abnormalities noted, AAO x 3  EKG: EKG was ordered today 11/25/15-sinus bradycardia rate 56 with first-degree AV block EKG personally viewed Previous sinus bradycardia 50s  ASSESSMENT AND PLAN:  1. Sinus bradycardia-opted not to go forward with pacemaker. Avoiding all AV nodal blocking agents. Appreciate Dr. Rayann Heman consultation. Seems stable, no syncopal symptoms. Mild first-degree AV block 246 ms. 2. CAD-DES to LAD, moderate disease elsewhere. Catheterization in 2012-patent stent. Nuclear stress test 11/14, no ischemia. Normal ejection fraction. Excellent.  We will continue to follow. Continue use nitroglycerin on as-needed basis. Very rare episodes of chest pain. 3. Fatigue - on synthroid. She feels as if she is lacking something. She will be discussing this further with Dr. Laurann Montana for further blood work evaluation. 4. Obesity-continue to encourage weight loss. She has done a good job. She states that in her 15s she was very active, [employer] member, lifted weights, 45 pound free weight curls, hurt her back and then could not walk country [employer].  5. 6 month follow  up.  Signed, [name], MD [facility]  11/25/2015 3:22 PM

## 2015-11-26 ENCOUNTER — Other Ambulatory Visit: Payer: Medicare Other

## 2016-03-23 ENCOUNTER — Telehealth: Payer: Self-pay | Admitting: Cardiology

## 2016-03-23 NOTE — Telephone Encounter (Signed)
Pt states yesterday she had an episode of feeling like a band was across her chest associated with chest tightness. Pt states she took 4 NTG and chest pain resolved after about 30 minutes. Pt denies active chest pain at this time. Pt states she has had a hard time getting her BP under control, earlier today it was 154/70, a short time ago it was 128/70  I reviewed with Dr Marlou Porch, he recommended outpt Lexiscan myoview. I spoke with pt to give her Dr Marlou Porch recommendation Pt states she is very tired, fatigued, and doesn't feel quite right now, she feels like something might happen at any time. Pt states in the last month she has had 2 other episodes of chest discomfort.  Pt advised to report to [facility] ED for further evaluation, pt advised not to drive.  Pt agreed with this plan.

## 2016-03-23 NOTE — Telephone Encounter (Signed)
New message    Pt c/o BP issue: STAT if pt c/o blurred vision, one-sided weakness or slurred speech  1. What are your last 5 BP readings? Today 154/80, 164/90 yesterday morning, 174/90 yesterday afternoon  2. Are you having any other symptoms (ex. Dizziness, headache, blurred vision, passed out)? Chest tightness yesterday  3. What is your BP issue? SOB

## 2016-03-26 ENCOUNTER — Ambulatory Visit (INDEPENDENT_AMBULATORY_CARE_PROVIDER_SITE_OTHER): Payer: Medicare Other | Admitting: Cardiology

## 2016-03-26 ENCOUNTER — Encounter: Payer: Self-pay | Admitting: Cardiology

## 2016-03-26 VITALS — BP 137/70 | HR 70 | Ht 59.5 in | Wt 172.8 lb

## 2016-03-26 DIAGNOSIS — I2510 Atherosclerotic heart disease of native coronary artery without angina pectoris: Secondary | ICD-10-CM | POA: Diagnosis not present

## 2016-03-26 DIAGNOSIS — I209 Angina pectoris, unspecified: Secondary | ICD-10-CM | POA: Diagnosis not present

## 2016-03-26 DIAGNOSIS — I2583 Coronary atherosclerosis due to lipid rich plaque: Secondary | ICD-10-CM

## 2016-03-26 DIAGNOSIS — E669 Obesity, unspecified: Secondary | ICD-10-CM

## 2016-03-26 DIAGNOSIS — I5032 Chronic diastolic (congestive) heart failure: Secondary | ICD-10-CM

## 2016-03-26 DIAGNOSIS — R001 Bradycardia, unspecified: Secondary | ICD-10-CM | POA: Diagnosis not present

## 2016-03-26 MED ORDER — ISOSORBIDE MONONITRATE ER 30 MG PO TB24
ORAL_TABLET | ORAL | 6 refills | Status: DC
Start: 2016-03-26 — End: 2016-05-19

## 2016-03-26 NOTE — Patient Instructions (Signed)
Your physician has requested that you have a lexiscan myoview. For further information please visit [email or web address]. Please follow instruction sheet, as given.  Your physician has recommended you make the following change in your medication: start new isosorbide 30 mg prescription. ( take 1/2 tablet daily) this has been sent to your pharmacy.  Your physician recommends that you schedule a follow-up appointment 1 week following lexiscan with Mickel Baas.

## 2016-03-26 NOTE — Progress Notes (Signed)
Cardiology Office Note   Date:  03/26/2016   ID:  Kristine Oliver May 18, 1931, MRN [id number]  PCP:  Irven Shelling, MD  Cardiologist:  Dr. Marlou Porch     Chief Complaint  Patient presents with  . Chest Pain      History of Present Illness: Kristine Oliver is a 82 y.o. female who presents for chest pain.  She has a hx. Of diastolic heart failure, old myocardial infarction in 2006 treated with DES to LAD with followup catheterization in March of 2012 showing patent LAD stent with jailed septal, 30% circumflex, 30% RCA, normal EF, chronic kidney disease stage III with creatinine of 1.3, hypertension, obesity, hyperlipidemia with last nuclear stress test 06/27/13 negative for ischemia, EF 62% here for followup.  She also a hx of bradycardia despite stopping amlodipine and she wore a 48-hour Holter monitor which demonstrated chronotropic incompetence as well as sinus pauses with average heart rate of 57 beats per minute ranging from 46-80. She had symptomatic sinus bradycardia and was requested to have pacemaker placed but she canceled her procedure.  She still having mild dyspnea on exertion but this is not severe. No syncope. She will occasionally have "anginal symptoms ". She is very happy about having her rolling walker that she sit on. Walks in the park.   Used to lift 45 pound free weights.   She knows she does not have sleep apnea (never had sleep study). She is very proud about her knowledge base. She admitted that she was eating salty potato chips with for proximal 1 month and gained 5 pounds. She was having some swelling in her legs.  Today she complains of episodic chest pain and increased fatigue.  She has had 3 total episodes requiring NTG which resolve the pain.  She also has some dizziness.  With her chest pain she has SOB, mild nausea and feels damp.  At times her BP is elevated as well.     Past Medical History:  Diagnosis Date  . Bipolar affective disorder  ([place])   . Bradycardia   . CAD (coronary artery disease) 02/12/2012   a. NSTEMI 2006:  2.7 x 28 mm Taxus stent to LAD 2006;  b.  Cath 3/12 Dr. Marlou Porch  Normal left main, patent LAD stents with jailed septal, 30% circumflex, 30% RCA    . Chronic diastolic heart failure ([place])    a. Echo 7/13:  mod LVH, EF 65-70%, vigorous LV, Gr 1 diast dysfn, mild LAE  . Chronic pain disorder   . DDD (degenerative disc disease), lumbosacral    , Spinal stenosis  . Diabetes mellitus ([place])    , Type II  . GERD (gastroesophageal reflux disease)   . HTN (hypertension)   . Hyperlipidemia   . Hyperthyroidism   . IBS (irritable bowel syndrome)   . Lumbar disc disease   . Non Q wave myocardial infarction ([place]) 2005  . Nonproliferative diabetic retinopathy associated with type 2 diabetes mellitus ([place])   . Obesity (BMI 30-39.9)   . Peripheral neuropathy ([facility])    , Associated with DM  . Scoliosis     Past Surgical History:  Procedure Laterality Date  . BACK SURGERY    . CARDIAC CATHETERIZATION    . CHOLECYSTECTOMY    . DILATION AND CURETTAGE OF UTERUS    . HEMILAMINOTOMY LUMBAR SPINE  2002   Rt L5, with decompression and foraminotomy  . INDUCED ABORTION     , x2  . LAMINOTOMY Right   .  LUMBAR DISC SURGERY  07/1999   L5-S1  . PILONIDAL CYST EXCISION    . ROTATOR CUFF REPAIR    . TONSILLECTOMY    . TOTAL ABDOMINAL HYSTERECTOMY W/ BILATERAL SALPINGOOPHORECTOMY       Current Outpatient Prescriptions  Medication Sig Dispense Refill  . ALPRAZolam (XANAX) 0.25 MG tablet Take 0.25 mg by mouth 3 (three) times daily as needed. anxiety    . antiseptic oral rinse (BIOTENE) LIQD 15 mLs by Mouth Rinse route as needed for dry mouth.    Marland Kitchen aspirin 81 MG tablet Take 81 mg by mouth daily.    . clindamycin (CLEOCIN T) 1 % lotion Apply 1 application topically daily.    . clopidogrel (PLAVIX) 75 MG tablet Take 75 mg by mouth daily.    Marland Kitchen estrogen-methylTESTOSTERone 0.625-1.25 MG per tablet Take 1 tablet by mouth  daily.    Marland Kitchen gabapentin (NEURONTIN) 300 MG capsule Take 300 mg by mouth 2 (two) times daily.     Marland Kitchen levothyroxine (SYNTHROID, LEVOTHROID) 75 MCG tablet Take 75 mcg by mouth daily.    Marland Kitchen lidocaine (LIDODERM) 5 % Place 2 patches onto the skin as needed. Remove & Discard patch within 12 hours or as directed by MD    . Sunday Corn Mineral Oil-Mineral Oil (RETAINE MGD OP) Apply 1 application to eye as directed.     . metFORMIN (GLUCOPHAGE) 500 MG tablet Take 1 tablet by mouth 2 (two) times daily.  0  . nitroGLYCERIN (NITROSTAT) 0.4 MG SL tablet Place 1 tablet (0.4 mg total) under the tongue every 5 (five) minutes x 3 doses as needed for chest pain. 30 tablet 12  . Oxycodone HCl 10 MG TABS Take 1 tablet by mouth 4 (four) times daily.  0  . PREMARIN vaginal cream Place 1 Applicatorful vaginally as needed.     . ramipril (ALTACE) 5 MG tablet Take 5 mg by mouth daily.    . valACYclovir (VALTREX) 500 MG tablet Take 1,000 mg by mouth daily.     . vitamin B-12 (CYANOCOBALAMIN) 1000 MCG tablet Take 1,000 mcg by mouth daily.    Marland Kitchen zolpidem (AMBIEN) 10 MG tablet Take 10 mg by mouth at bedtime.     No current facility-administered medications for this visit.     Allergies:   Pravastatin and Prednisone    Social History:  The patient  reports that she has quit smoking. She does not have any smokeless tobacco history on file. She reports that she does not drink alcohol or use drugs.   Family History:  The patient's family history includes Coronary artery disease in her mother; Diabetes in her father; Heart failure in her father.    ROS:  General:no colds or fevers, no weight changes Skin:no rashes or ulcers HEENT:no blurred vision, no congestion CV:see HPI PUL:see HPI GI:no diarrhea constipation or melena, no indigestion GU:no hematuria, no dysuria MS:no joint pain, no claudication Neuro:no syncope, no lightheadedness Endo:+ diabetes, + thyroid disease  Wt Readings from Last 3 Encounters:  03/26/16 172 lb  12.8 oz (78.4 kg)  11/25/15 173 lb 12.8 oz (78.8 kg)  09/26/14 171 lb 6.4 oz (77.7 kg)     PHYSICAL EXAM: VS:  BP 137/70   Pulse 70   Ht 4' 11.5" (1.511 m)   Wt 172 lb 12.8 oz (78.4 kg)   BMI 34.32 kg/m  , BMI Body mass index is 34.32 kg/m. General:Pleasant affect, NAD Skin:Warm and dry, brisk capillary refill HEENT:normocephalic, sclera clear, mucus membranes moist Neck:supple, no  JVD, no bruits  Heart:S1S2 RRR without murmur, gallup, rub or click Lungs:clear without rales, rhonchi, or wheezes [id number], non tender, + BS, do not palpate liver spleen or masses Ext:no lower ext edema, 2+ pedal pulses, 2+ radial pulses Neuro:alert and oriented, MAE, follows commands, + facial symmetry    EKG:  EKG is ordered today. The ekg ordered today demonstrates SR with LVH no acute changes.     Recent Labs: 11/25/2015: BUN 18; Creat 1.21; Potassium 4.6; Sodium 140    Lipid Panel    Component Value Date/Time   CHOL 155 05/02/2012 0635   TRIG 233 (H) 05/02/2012 0635   HDL 28 (L) 05/02/2012 0635   CHOLHDL 5.5 05/02/2012 0635   VLDL 47 (H) 05/02/2012 0635   LDLCALC 80 05/02/2012 [id number]       Other studies Reviewed: Additional studies/ records that were reviewed today include: . ECHO 06/2013 Study Conclusions  - Left ventricle: The cavity size was normal. There was mild focal basal hypertrophy of the septum. Systolic function was normal. The estimated ejection fraction was in the range of 60% to 65%. Wall motion was normal; there were no regional wall motion abnormalities. There was an increased relative contribution of atrial contraction to ventricular filling. Doppler parameters are consistent with abnormal left ventricular relaxation (grade 1 diastolic dysfunction). - Mitral valve: Mild regurgitation. - Left atrium: The atrium was mildly dilated.  ASSESSMENT AND PLAN: 1.  Chest pain will proceed with lexiscan myoivew- pt and family agreeable.  Will add  Imdur to meds.  15 mg daily.   1. Sinus bradycardia-opted not to go forward with pacemaker. Avoiding all AV nodal blocking agents. Appreciate Dr. Rayann Heman consultation. Seems stable, no syncopal symptoms. Mild first-degree AV block 246 ms. 2. CAD-DES to LAD, moderate disease elsewhere. Catheterization in 2012-patent stent. Nuclear stress test 11/14, no ischemia. Normal ejection fraction. Excellent.  We will continue to follow. Continue use nitroglycerin on as-needed basis. Very rare episodes of chest pain. 3. Fatigue - on synthroid. She feels as if she is lacking something. She will be discussing this further with Dr. Laurann Montana f 4. Obesity-continue to encourage weight loss. She has done a good job. She states that in her 69s she was very active, [employer] club member, lifted weights, 45 pound free weight curls, hurt her back and then could not walk country [employer].  5. 2 week follow up. Post test.  If + we briefly discussed cardiac cath.     Current medicines are reviewed with the patient today.  The patient Has no concerns regarding medicines.  The following changes have been made:  See above Labs/ tests ordered today include:see above  Disposition:   FU:  see above  Signed, Cecilie Kicks, NP  03/26/2016 2:45 PM    [facility] [place], [place], [place], [place] Phone: [phone]; Fax: [phone]

## 2016-03-30 ENCOUNTER — Telehealth (HOSPITAL_COMMUNITY): Payer: Self-pay | Admitting: Radiology

## 2016-03-30 NOTE — Telephone Encounter (Signed)
Patient given detailed instructions per Myocardial Perfusion Study Information Sheet for the test on 03/31/2016 at 7:45. Patient notified to arrive 15 minutes early and that it is imperative to arrive on time for appointment to keep from having the test rescheduled.  If you need to cancel or reschedule your appointment, please call the office within 24 hours of your appointment. Failure to do so may result in a cancellation of your appointment, and a $50 no show fee. Patient verbalized understanding.EHK

## 2016-03-31 ENCOUNTER — Ambulatory Visit (HOSPITAL_COMMUNITY): Payer: Medicare Other | Attending: Cardiology

## 2016-03-31 DIAGNOSIS — I2510 Atherosclerotic heart disease of native coronary artery without angina pectoris: Secondary | ICD-10-CM | POA: Diagnosis not present

## 2016-03-31 DIAGNOSIS — I509 Heart failure, unspecified: Secondary | ICD-10-CM | POA: Insufficient documentation

## 2016-03-31 DIAGNOSIS — I209 Angina pectoris, unspecified: Secondary | ICD-10-CM

## 2016-03-31 DIAGNOSIS — R110 Nausea: Secondary | ICD-10-CM

## 2016-03-31 DIAGNOSIS — I2583 Coronary atherosclerosis due to lipid rich plaque: Secondary | ICD-10-CM

## 2016-03-31 DIAGNOSIS — I110 Hypertensive heart disease with heart failure: Secondary | ICD-10-CM | POA: Diagnosis not present

## 2016-03-31 LAB — MYOCARDIAL PERFUSION IMAGING
CHL CUP NUCLEAR SDS: 6
CHL CUP NUCLEAR SRS: 4
CHL CUP NUCLEAR SSS: 10
LHR: 0.3
LV dias vol: 86 mL (ref 46–106)
LV sys vol: 37 mL
NUC STRESS TID: 1.07
Peak HR: 75 {beats}/min
Rest HR: 51 {beats}/min

## 2016-03-31 MED ORDER — TECHNETIUM TC 99M TETROFOSMIN IV KIT
10.4000 | PACK | Freq: Once | INTRAVENOUS | Status: AC | PRN
  Administered 2016-03-31: 10 via INTRAVENOUS
  Filled 2016-03-31: qty 10

## 2016-03-31 MED ORDER — AMINOPHYLLINE 25 MG/ML IV SOLN
75.0000 mg | Freq: Once | INTRAVENOUS | Status: AC
  Administered 2016-03-31: 75 mg via INTRAVENOUS

## 2016-03-31 MED ORDER — TECHNETIUM TC 99M TETROFOSMIN IV KIT
32.9000 | PACK | Freq: Once | INTRAVENOUS | Status: AC | PRN
  Administered 2016-03-31: 32.9 via INTRAVENOUS
  Filled 2016-03-31: qty 33

## 2016-03-31 MED ORDER — REGADENOSON 0.4 MG/5ML IV SOLN
0.4000 mg | Freq: Once | INTRAVENOUS | Status: AC
  Administered 2016-03-31: 0.4 mg via INTRAVENOUS

## 2016-04-10 ENCOUNTER — Ambulatory Visit (INDEPENDENT_AMBULATORY_CARE_PROVIDER_SITE_OTHER): Payer: Medicare Other | Admitting: Physician Assistant

## 2016-04-10 ENCOUNTER — Encounter: Payer: Self-pay | Admitting: Physician Assistant

## 2016-04-10 VITALS — BP 128/82 | HR 43 | Ht 59.5 in | Wt 175.0 lb

## 2016-04-10 DIAGNOSIS — I25119 Atherosclerotic heart disease of native coronary artery with unspecified angina pectoris: Secondary | ICD-10-CM | POA: Diagnosis not present

## 2016-04-10 DIAGNOSIS — I10 Essential (primary) hypertension: Secondary | ICD-10-CM

## 2016-04-10 DIAGNOSIS — I495 Sick sinus syndrome: Secondary | ICD-10-CM | POA: Diagnosis not present

## 2016-04-10 NOTE — Progress Notes (Signed)
Cardiology Office Note    Date:  04/10/2016   ID:  Kristine, Oliver 02/06/1931, MRN [id number]  PCP:  Irven Shelling, MD  Cardiologist:  Dr. Marlou Porch   CC: follow up after nuc  History of Present Illness:  Kristine Oliver is a 82 y.o. female with a history of CAD s/p DES to LAD (2006), chronic diastolic CHF, CKD, HTN, HLD, obesity and symptomatic sinus brady with chronotropic incompetence (declined PPM) who presents to clinic for follow up.  She has a hx of CAD with myocardial infarction in 2006 treated with DES to LAD with followup catheterization in 10/2010 showing patent LAD stent with jailed septal, 30% circumflex, 30% RCA, normal EF.  She also a hx of bradycardia and she wore a 48-hour Holter monitor 2015 which demonstrated chronotropic incompetence as well as sinus pauses with average heart rate of 57 beats per minute ranging from 46-80. Dr. Rayann Heman recommended PPM but she canceled her procedure.  She was seen by Cecilie Kicks NP in the office on 03/26/16 for evaluation of chest pain and fatigue. A stress test was ordered which was low risk.  Today she presents to clinic for follow up. She just woke up and is a little sleepy today. She has no more chest pain. She thinks the imdur is really helping. She has no LE edema, orthopnea or PND. She does have occasional dizziness but no syncope. No blood in stool or urine. No palpitations. She has been walking a lot with no issues at all. She walks with a walker.   Past Medical History:  Diagnosis Date  . Bipolar affective disorder ([place])   . Bradycardia   . CAD (coronary artery disease) 02/12/2012   a. NSTEMI 2006:  2.7 x 28 mm Taxus stent to LAD 2006;  b.  Cath 3/12 Dr. Marlou Porch  Normal left main, patent LAD stents with jailed septal, 30% circumflex, 30% RCA    . Chronic diastolic heart failure ([place])    a. Echo 7/13:  mod LVH, EF 65-70%, vigorous LV, Gr 1 diast dysfn, mild LAE  . Chronic pain disorder   . DDD (degenerative disc  disease), lumbosacral    , Spinal stenosis  . Diabetes mellitus ([place])    , Type II  . GERD (gastroesophageal reflux disease)   . HTN (hypertension)   . Hyperlipidemia   . Hyperthyroidism   . IBS (irritable bowel syndrome)   . Lumbar disc disease   . Non Q wave myocardial infarction ([place]) 2005  . Nonproliferative diabetic retinopathy associated with type 2 diabetes mellitus ([place])   . Obesity (BMI 30-39.9)   . Peripheral neuropathy ([facility])    , Associated with DM  . Scoliosis     Past Surgical History:  Procedure Laterality Date  . BACK SURGERY    . CARDIAC CATHETERIZATION    . CHOLECYSTECTOMY    . DILATION AND CURETTAGE OF UTERUS    . HEMILAMINOTOMY LUMBAR SPINE  2002   Rt L5, with decompression and foraminotomy  . INDUCED ABORTION     , x2  . LAMINOTOMY Right   . LUMBAR DISC SURGERY  07/1999   L5-S1  . PILONIDAL CYST EXCISION    . ROTATOR CUFF REPAIR    . TONSILLECTOMY    . TOTAL ABDOMINAL HYSTERECTOMY W/ BILATERAL SALPINGOOPHORECTOMY      Current Medications: Outpatient Medications Prior to Visit  Medication Sig Dispense Refill  . ALPRAZolam (XANAX) 0.25 MG tablet Take 0.25 mg by mouth 3 (three) times  daily as needed. anxiety    . antiseptic oral rinse (BIOTENE) LIQD 15 mLs by Mouth Rinse route as needed for dry mouth.    Marland Kitchen aspirin 81 MG tablet Take 81 mg by mouth daily.    . clindamycin (CLEOCIN T) 1 % lotion Apply 1 application topically daily.    . clopidogrel (PLAVIX) 75 MG tablet Take 75 mg by mouth daily.    Marland Kitchen estrogen-methylTESTOSTERone 0.625-1.25 MG per tablet Take 1 tablet by mouth daily.    Marland Kitchen gabapentin (NEURONTIN) 300 MG capsule Take 300 mg by mouth 2 (two) times daily.     . isosorbide mononitrate (IMDUR) 30 MG 24 hr tablet Take 1/2-1 tablet daily. 30 tablet 6  . levothyroxine (SYNTHROID, LEVOTHROID) 75 MCG tablet Take 75 mcg by mouth daily.    Marland Kitchen lidocaine (LIDODERM) 5 % Place 2 patches onto the skin as needed. Remove & Discard patch within 12 hours or as  directed by MD    . Sunday Corn Mineral Oil-Mineral Oil (RETAINE MGD OP) Apply 1 application to eye as directed.     . metFORMIN (GLUCOPHAGE) 500 MG tablet Take 1 tablet by mouth 2 (two) times daily.  0  . nitroGLYCERIN (NITROSTAT) 0.4 MG SL tablet Place 1 tablet (0.4 mg total) under the tongue every 5 (five) minutes x 3 doses as needed for chest pain. 30 tablet 12  . Oxycodone HCl 10 MG TABS Take 1 tablet by mouth 4 (four) times daily.  0  . PREMARIN vaginal cream Place 1 Applicatorful vaginally as needed.     . ramipril (ALTACE) 5 MG tablet Take 5 mg by mouth daily.    . valACYclovir (VALTREX) 500 MG tablet Take 1,000 mg by mouth daily.     . vitamin B-12 (CYANOCOBALAMIN) 1000 MCG tablet Take 1,000 mcg by mouth daily.    Marland Kitchen zolpidem (AMBIEN) 10 MG tablet Take 10 mg by mouth at bedtime.     No facility-administered medications prior to visit.      Allergies:   Pravastatin and Prednisone   Social History   Social History  . Marital status: Divorced    Spouse name: N/A  . Number of children: N/A  . Years of education: N/A   Social History Main Topics  . Smoking status: Former Research scientist (life sciences)  . Smokeless tobacco: None  . Alcohol use No     Comment: social drinking, not often  . Drug use: No  . Sexual activity: Not Asked   Other Topics Concern  . None   Social History Narrative   Divorced.     Family History:  The patient's family history includes Coronary artery disease in her mother; Diabetes in her father; Heart failure in her father.     ROS:   Please see the history of present illness.    ROS All other systems reviewed and are negative.   PHYSICAL EXAM:   VS:  BP 128/82   Pulse (!) 43   Ht 4' 11.5" (1.511 m)   Wt 175 lb (79.4 kg)   BMI 34.75 kg/m    GEN: Well nourished, well developed, in no acute distress, obese HEENT: normal  Neck: no JVD, carotid bruits, or masses Cardiac: brady; no murmurs, rubs, or gallops,no edema  Respiratory:  clear to auscultation bilaterally,  normal work of breathing GI: soft, nontender, nondistended, + BS MS: no deformity or atrophy  Skin: warm and dry, no rash Neuro:  Alert and Oriented x 3, Strength and sensation are intact Psych: euthymic mood,  full affect  Wt Readings from Last 3 Encounters:  04/10/16 175 lb (79.4 kg)  03/26/16 172 lb 12.8 oz (78.4 kg)  11/25/15 173 lb 12.8 oz (78.8 kg)      Studies/Labs Reviewed:   EKG:  EKG is NOT ordered today.   Recent Labs: 11/25/2015: BUN 18; Creat 1.21; Potassium 4.6; Sodium 140   Lipid Panel    Component Value Date/Time   CHOL 155 05/02/2012 0635   TRIG 233 (H) 05/02/2012 0635   HDL 28 (L) 05/02/2012 0635   CHOLHDL 5.5 05/02/2012 0635   VLDL 47 (H) 05/02/2012 0635   LDLCALC 80 05/02/2012 0635    Additional studies/ records that were reviewed today include:  Nuclear stress test 03/31/16 Study Highlights   Nuclear stress EF: 57%.  There was no ST segment deviation noted during stress.  The study is normal.  This is a low risk study.  The left ventricular ejection fraction is normal (55-65%).      ECHO 06/2013 Study Conclusions - Left ventricle: The cavity size was normal. There was mild focal basal hypertrophy of the septum. Systolic function was normal. The estimated ejection fraction was in the range of 60% to 65%. Wall motion was normal; there were no regional wall motion abnormalities. There was an increased relative contribution of atrial contraction to ventricular filling. Doppler parameters are consistent with abnormal left ventricular relaxation (grade 1 diastolic dysfunction). - Mitral valve: Mild regurgitation. - Left atrium: The atrium was mildly dilated.  ASSESSMENT & PLAN:   Chest pain: resolved. Recent normal nuclear stress test  CAD s/p DES to LAD: stable. Continue ASA/plavix, imdur. Intolerant to statins. No BB due to bradycardia  Hx of sinus brady and chronotropic intolerance: offered PPM in past but decline. She  has been doing well. Avoid AVN blocking agents.   HTN: BP well controlled on current regimen  Obesity: diet and weight loss recommended. Body mass index is 34.75 kg/m.  Medication Adjustments/Labs and Tests Ordered: Current medicines are reviewed at length with the patient today.  Concerns regarding medicines are outlined above.  Medication changes, Labs and Tests ordered today are listed in the Patient Instructions below. Patient Instructions  Medication Instructions:  Your physician recommends that you continue on your current medications as directed. Please refer to the Current Medication list given to you today.   Labwork: None ordered  Testing/Procedures: None ordered  Follow-Up: Your physician wants you to follow-up in: [place] will receive a reminder letter in the mail two months in advance. If you don't receive a letter, please call our office to schedule the follow-up appointment.     Any Other Special Instructions Will Be Listed Below (If Applicable).     If you need a refill on your cardiac medications before your next appointment, please call your pharmacy.      Signed, Angelena Form, PA-C  04/10/2016 4:03 PM    [facility] [place], [place], [place]  [address] Phone: [phone]; Fax: [phone]

## 2016-04-10 NOTE — Patient Instructions (Addendum)

## 2016-05-18 ENCOUNTER — Observation Stay (HOSPITAL_COMMUNITY)
Admission: EM | Admit: 2016-05-18 | Discharge: 2016-05-19 | Disposition: A | Discharge status: Home / Self Care | Payer: Medicare Other | Attending: Internal Medicine | Admitting: Internal Medicine

## 2016-05-18 ENCOUNTER — Emergency Department (HOSPITAL_COMMUNITY): Payer: Medicare Other

## 2016-05-18 ENCOUNTER — Encounter (HOSPITAL_COMMUNITY): Payer: Self-pay | Admitting: Emergency Medicine

## 2016-05-18 DIAGNOSIS — F3171 Bipolar disorder, in partial remission, most recent episode hypomanic: Secondary | ICD-10-CM | POA: Diagnosis not present

## 2016-05-18 DIAGNOSIS — F319 Bipolar disorder, unspecified: Secondary | ICD-10-CM | POA: Diagnosis not present

## 2016-05-18 DIAGNOSIS — E669 Obesity, unspecified: Secondary | ICD-10-CM | POA: Diagnosis not present

## 2016-05-18 DIAGNOSIS — Z87891 Personal history of nicotine dependence: Secondary | ICD-10-CM | POA: Diagnosis not present

## 2016-05-18 DIAGNOSIS — I10 Essential (primary) hypertension: Secondary | ICD-10-CM | POA: Diagnosis not present

## 2016-05-18 DIAGNOSIS — E1169 Type 2 diabetes mellitus with other specified complication: Secondary | ICD-10-CM | POA: Diagnosis present

## 2016-05-18 DIAGNOSIS — I252 Old myocardial infarction: Secondary | ICD-10-CM | POA: Insufficient documentation

## 2016-05-18 DIAGNOSIS — I5032 Chronic diastolic (congestive) heart failure: Secondary | ICD-10-CM | POA: Diagnosis not present

## 2016-05-18 DIAGNOSIS — Z6834 Body mass index (BMI) 34.0-34.9, adult: Secondary | ICD-10-CM | POA: Insufficient documentation

## 2016-05-18 DIAGNOSIS — R079 Chest pain, unspecified: Secondary | ICD-10-CM | POA: Diagnosis present

## 2016-05-18 DIAGNOSIS — E1122 Type 2 diabetes mellitus with diabetic chronic kidney disease: Secondary | ICD-10-CM | POA: Diagnosis not present

## 2016-05-18 DIAGNOSIS — E1142 Type 2 diabetes mellitus with diabetic polyneuropathy: Secondary | ICD-10-CM | POA: Diagnosis not present

## 2016-05-18 DIAGNOSIS — I130 Hypertensive heart and chronic kidney disease with heart failure and stage 1 through stage 4 chronic kidney disease, or unspecified chronic kidney disease: Secondary | ICD-10-CM | POA: Insufficient documentation

## 2016-05-18 DIAGNOSIS — Z955 Presence of coronary angioplasty implant and graft: Secondary | ICD-10-CM | POA: Insufficient documentation

## 2016-05-18 DIAGNOSIS — E785 Hyperlipidemia, unspecified: Secondary | ICD-10-CM | POA: Insufficient documentation

## 2016-05-18 DIAGNOSIS — E039 Hypothyroidism, unspecified: Secondary | ICD-10-CM | POA: Diagnosis not present

## 2016-05-18 DIAGNOSIS — Z9049 Acquired absence of other specified parts of digestive tract: Secondary | ICD-10-CM | POA: Insufficient documentation

## 2016-05-18 DIAGNOSIS — Z79899 Other long term (current) drug therapy: Secondary | ICD-10-CM | POA: Diagnosis not present

## 2016-05-18 DIAGNOSIS — N189 Chronic kidney disease, unspecified: Secondary | ICD-10-CM | POA: Insufficient documentation

## 2016-05-18 DIAGNOSIS — Z7902 Long term (current) use of antithrombotics/antiplatelets: Secondary | ICD-10-CM | POA: Insufficient documentation

## 2016-05-18 DIAGNOSIS — I200 Unstable angina: Secondary | ICD-10-CM

## 2016-05-18 DIAGNOSIS — I2510 Atherosclerotic heart disease of native coronary artery without angina pectoris: Secondary | ICD-10-CM | POA: Insufficient documentation

## 2016-05-18 DIAGNOSIS — R072 Precordial pain: Secondary | ICD-10-CM | POA: Diagnosis not present

## 2016-05-18 DIAGNOSIS — Z7982 Long term (current) use of aspirin: Secondary | ICD-10-CM | POA: Insufficient documentation

## 2016-05-18 DIAGNOSIS — Z7984 Long term (current) use of oral hypoglycemic drugs: Secondary | ICD-10-CM | POA: Insufficient documentation

## 2016-05-18 DIAGNOSIS — G8929 Other chronic pain: Secondary | ICD-10-CM | POA: Insufficient documentation

## 2016-05-18 DIAGNOSIS — G894 Chronic pain syndrome: Secondary | ICD-10-CM | POA: Diagnosis not present

## 2016-05-18 DIAGNOSIS — E1159 Type 2 diabetes mellitus with other circulatory complications: Secondary | ICD-10-CM | POA: Diagnosis present

## 2016-05-18 LAB — I-STAT TROPONIN, ED: TROPONIN I, POC: 0.01 ng/mL (ref 0.00–0.08)

## 2016-05-18 LAB — CBC
HCT: 38.5 % (ref 36.0–46.0)
Hemoglobin: 12.5 g/dL (ref 12.0–15.0)
MCH: 30.3 pg (ref 26.0–34.0)
MCHC: 32.5 g/dL (ref 30.0–36.0)
MCV: 93.2 fL (ref 78.0–100.0)
PLATELETS: 225 10*3/uL (ref 150–400)
RBC: 4.13 MIL/uL (ref 3.87–5.11)
RDW: 15.5 % (ref 11.5–15.5)
WBC: 10 10*3/uL (ref 4.0–10.5)

## 2016-05-18 LAB — GLUCOSE, CAPILLARY: Glucose-Capillary: 149 mg/dL — ABNORMAL HIGH (ref 65–99)

## 2016-05-18 LAB — BASIC METABOLIC PANEL
ANION GAP: 7 (ref 5–15)
BUN: 15 mg/dL (ref 6–20)
CALCIUM: 10.7 mg/dL — AB (ref 8.9–10.3)
CO2: 21 mmol/L — ABNORMAL LOW (ref 22–32)
CREATININE: 1.34 mg/dL — AB (ref 0.44–1.00)
Chloride: 111 mmol/L (ref 101–111)
GFR calc Af Amer: 41 mL/min — ABNORMAL LOW (ref >60)
GFR, EST NON AFRICAN AMERICAN: 36 mL/min — AB (ref >60)
GLUCOSE: 88 mg/dL (ref 65–99)
Potassium: 4.4 mmol/L (ref 3.5–5.1)
Sodium: 139 mmol/L (ref 135–145)

## 2016-05-18 LAB — TROPONIN I: Troponin I: <0.03 ng/mL (ref <0.03)

## 2016-05-18 MED ORDER — ONDANSETRON HCL 4 MG/2ML IJ SOLN: 4.0000 mg | Freq: Four times a day (QID) | INTRAMUSCULAR | Status: DC | PRN

## 2016-05-18 MED ORDER — VALACYCLOVIR HCL 500 MG PO TABS
1000.0000 mg | ORAL_TABLET | Freq: Every day | ORAL | Status: DC
  Administered 2016-05-19: 1000 mg via ORAL
  Filled 2016-05-18: qty 2

## 2016-05-18 MED ORDER — MORPHINE SULFATE (PF) 2 MG/ML IV SOLN: 2.0000 mg | INTRAVENOUS | Status: DC | PRN

## 2016-05-18 MED ORDER — ISOSORBIDE MONONITRATE ER 30 MG PO TB24: 15.0000 mg | ORAL_TABLET | Freq: Every day | ORAL | Status: DC

## 2016-05-18 MED ORDER — OXYCODONE HCL 5 MG PO TABS: 10.0000 mg | ORAL_TABLET | Freq: Three times a day (TID) | ORAL | Status: DC

## 2016-05-18 MED ORDER — ACETAMINOPHEN 325 MG PO TABS: 650.0000 mg | ORAL_TABLET | ORAL | Status: DC | PRN

## 2016-05-18 MED ORDER — INSULIN ASPART 100 UNIT/ML ~~LOC~~ SOLN: 0.0000 [IU] | Freq: Three times a day (TID) | SUBCUTANEOUS | Status: DC

## 2016-05-18 MED ORDER — LEVOTHYROXINE SODIUM 75 MCG PO TABS
75.0000 ug | ORAL_TABLET | Freq: Every day | ORAL | Status: DC
  Administered 2016-05-19: 75 ug via ORAL
  Filled 2016-05-18: qty 1

## 2016-05-18 MED ORDER — GI COCKTAIL ~~LOC~~: 30.0000 mL | Freq: Four times a day (QID) | ORAL | Status: DC | PRN

## 2016-05-18 MED ORDER — OXYCODONE-ACETAMINOPHEN 5-325 MG PO TABS
1.0000 | ORAL_TABLET | Freq: Once | ORAL | Status: AC
  Administered 2016-05-18: 1 via ORAL
  Filled 2016-05-18: qty 1

## 2016-05-18 MED ORDER — RAMIPRIL 5 MG PO CAPS: 5.0000 mg | ORAL_CAPSULE | Freq: Every day | ORAL | Status: DC

## 2016-05-18 MED ORDER — ENOXAPARIN SODIUM 30 MG/0.3ML ~~LOC~~ SOLN
30.0000 mg | Freq: Every day | SUBCUTANEOUS | Status: DC
Start: 2016-05-19 — End: 2016-05-19

## 2016-05-18 MED ORDER — ALPRAZOLAM 0.25 MG PO TABS: 0.2500 mg | ORAL_TABLET | Freq: Three times a day (TID) | ORAL | Status: DC | PRN

## 2016-05-18 MED ORDER — ZOLPIDEM TARTRATE 5 MG PO TABS
5.0000 mg | ORAL_TABLET | Freq: Every day | ORAL | Status: DC
Start: 2016-05-18 — End: 2016-05-19
  Administered 2016-05-18: 5 mg via ORAL
  Filled 2016-05-18: qty 1

## 2016-05-18 MED ORDER — GABAPENTIN 300 MG PO CAPS
300.0000 mg | ORAL_CAPSULE | Freq: Two times a day (BID) | ORAL | Status: DC
  Administered 2016-05-18 – 2016-05-19 (×2): 300 mg via ORAL
  Filled 2016-05-18 (×2): qty 1

## 2016-05-18 MED ORDER — OXYCODONE HCL 5 MG PO TABS
10.0000 mg | ORAL_TABLET | Freq: Three times a day (TID) | ORAL | Status: DC
  Administered 2016-05-18 – 2016-05-19 (×2): 10 mg via ORAL
  Filled 2016-05-18 (×2): qty 2

## 2016-05-18 MED ORDER — CLOPIDOGREL BISULFATE 75 MG PO TABS
75.0000 mg | ORAL_TABLET | Freq: Every day | ORAL | Status: DC
  Administered 2016-05-19: 75 mg via ORAL
  Filled 2016-05-18: qty 1

## 2016-05-18 MED ORDER — ASPIRIN EC 81 MG PO TBEC
81.0000 mg | DELAYED_RELEASE_TABLET | Freq: Every day | ORAL | Status: DC
  Administered 2016-05-19: 81 mg via ORAL
  Filled 2016-05-18: qty 1

## 2016-05-18 MED ORDER — CYCLOSPORINE 0.05 % OP EMUL
1.0000 [drp] | Freq: Two times a day (BID) | OPHTHALMIC | Status: DC
  Filled 2016-05-18 (×2): qty 1

## 2016-05-18 NOTE — H&P (Signed)
History and Physical    Kristine Oliver NTI:[id number] DOB: [birth year] DOA: 05/18/2016  PCP: Irven Shelling, MD Consultants:  Marlou Porch - Cardiology [facility]) Patient coming from: home - lives with nephew; Kristine Oliver is son - [phone]  Chief Complaint: chest pain  HPI: Kristine Oliver is a 82 y.o. female with medical history significant of CAD s/p stenting, HTN, HLD, DM, diastolic CHF, chronic pain, and bipolar who presented with chest pain.  Patient woke up after a good night sleep around 7am.  Felt good, washed hair and took a long shower.  Got ready to take her car to the shop and forgot to take Imdur - takes 1/2 pill daily for angina control.  Took all other medications.  Gets fatigued after 1 task per day normally.  Sat down about 2:30-3 at the car place and started feeling tightness in the chest.  Worsened.  Radiated down both arms.  Took 2 NTG with slight improvement.  Drove home and symptoms got worse, took 2 more NTG.  Finally with terrible gripping substernal pain and radiation down both arms with nausea and diaphoresis and SOB.  Finally called 911.  Took Imdur, unsure if that made her better but pain finally relented.  Now just feeling very tired.     ED Course: Per Dr. Canary Brim: Pt presenting with c/o chest pain that is most c/w anginal chest pain.  Pt with hx of CAD s/p cardiac stenting.  Recent negative stress test.  She is chest pain free in the ED and troponin is negative.  TWI in lateral leads are more prominent compared to prior EKG, pt admitted to triad for further management and evaluation.    Review of Systems: As per HPI; otherwise 10 point review of systems reviewed and negative.   Ambulatory Status:  Ambulates with walker  Past Medical History:  Diagnosis Date  . Bipolar affective disorder ([place])   . Bradycardia   . CAD (coronary artery disease) 02/12/2012   a. NSTEMI 2006:  2.7 x 28 mm Taxus stent to LAD 2006;  b.  Cath 3/12 Dr. Marlou Porch  Normal left main, patent LAD stents  with jailed septal, 30% circumflex, 30% RCA    . Chronic diastolic heart failure ([place])    a. Echo 7/13:  mod LVH, EF 65-70%, vigorous LV, Gr 1 diast dysfn, mild LAE  . Chronic pain disorder   . DDD (degenerative disc disease), lumbosacral    , Spinal stenosis  . Diabetes mellitus ([place])    , Type II  . GERD (gastroesophageal reflux disease)   . HTN (hypertension)   . Hyperlipidemia   . Hyperthyroidism   . IBS (irritable bowel syndrome)   . Lumbar disc disease   . Non Q wave myocardial infarction ([place]) 2005  . Nonproliferative diabetic retinopathy associated with type 2 diabetes mellitus ([place])   . Obesity (BMI 30-39.9)   . Peripheral neuropathy (HCC)    , Associated with DM  . Scoliosis     Past Surgical History:  Procedure Laterality Date  . BACK SURGERY    . CARDIAC CATHETERIZATION    . CHOLECYSTECTOMY    . DILATION AND CURETTAGE OF UTERUS    . HEMILAMINOTOMY LUMBAR SPINE  2002   Rt L5, with decompression and foraminotomy  . INDUCED ABORTION     , x2  . LAMINOTOMY Right   . LUMBAR DISC SURGERY  07/1999   L5-S1  . PILONIDAL CYST EXCISION    . ROTATOR CUFF REPAIR    .  TONSILLECTOMY    . TOTAL ABDOMINAL HYSTERECTOMY W/ BILATERAL SALPINGOOPHORECTOMY      Social History   Social History  . Marital status: Divorced    Spouse name: N/A  . Number of children: N/A  . Years of education: N/A   Occupational History  . retired    Social History Main Topics  . Smoking status: Former Smoker    Years: 35.00  . Smokeless tobacco: Never Used  . Alcohol use No     Comment: social drinking, not often  . Drug use: No  . Sexual activity: Not on file   Other Topics Concern  . Not on file   Social History Narrative   Divorced.    Allergies  Allergen Reactions  . Pravastatin Other (See Comments)    Muscle aches  . Prednisone Nausea And Vomiting    Family History  Problem Relation Age of Onset  . Heart failure Father   . Diabetes Father   . Coronary artery  disease Mother     Prior to Admission medications   Medication Sig Start Date End Date Taking? Authorizing Provider  ALPRAZolam (XANAX) 0.25 MG tablet Take 0.25 mg by mouth 3 (three) times daily as needed. anxiety   Yes Historical Provider, MD  antiseptic oral rinse (BIOTENE) LIQD 15 mLs by Mouth Rinse route as needed for dry mouth.   Yes Historical Provider, MD  aspirin 81 MG tablet Take 81 mg by mouth daily.   Yes Historical Provider, MD  clindamycin (CLEOCIN T) 1 % lotion Apply 1 application topically daily. 11/16/13  Yes Historical Provider, MD  clopidogrel (PLAVIX) 75 MG tablet Take 75 mg by mouth daily.   Yes Historical Provider, MD  cycloSPORINE (RESTASIS) 0.05 % ophthalmic emulsion Place 1 drop into both eyes 2 (two) times daily.   Yes Historical Provider, MD  estrogen-methylTESTOSTERone 0.625-1.25 MG per tablet Take 1 tablet by mouth daily.   Yes Historical Provider, MD  gabapentin (NEURONTIN) 300 MG capsule Take 300 mg by mouth 2 (two) times daily.    Yes Historical Provider, MD  isosorbide mononitrate (IMDUR) 30 MG 24 hr tablet Take 1/2-1 tablet daily. 03/26/16  Yes Isaiah Serge, NP  levothyroxine (SYNTHROID, LEVOTHROID) 75 MCG tablet Take 75 mcg by mouth daily.   Yes Historical Provider, MD  lidocaine (LIDODERM) 5 % Place 2 patches onto the skin as needed. Remove & Discard patch within 12 hours or as directed by MD   Yes Historical Provider, MD  metFORMIN (GLUCOPHAGE) 500 MG tablet Take 1 tablet by mouth 2 (two) times daily. 03/17/16  Yes Historical Provider, MD  nitroGLYCERIN (NITROSTAT) 0.4 MG SL tablet Place 1 tablet (0.4 mg total) under the tongue every 5 (five) minutes x 3 doses as needed for chest pain. 05/02/12  Yes Jerline Pain, MD  Oxycodone HCl 10 MG TABS Take 1 tablet by mouth 4 (four) times daily. 05/22/15  Yes Historical Provider, MD  PREMARIN vaginal cream Place 1 Applicatorful vaginally as needed.  10/06/13  Yes Historical Provider, MD  ramipril (ALTACE) 5 MG tablet Take 5  mg by mouth daily.   Yes Historical Provider, MD  valACYclovir (VALTREX) 500 MG tablet Take 1,000 mg by mouth daily.    Yes Historical Provider, MD  vitamin B-12 (CYANOCOBALAMIN) 1000 MCG tablet Take 1,000 mcg by mouth daily.   Yes Historical Provider, MD  zolpidem (AMBIEN) 10 MG tablet Take 10 mg by mouth at bedtime.   Yes Historical Provider, MD    Physical Exam:  Vitals:   05/18/16 2030 05/18/16 2045 05/18/16 2208 05/18/16 2305  BP: 132/83 178/96 (!) 166/53 (!) 164/56  Pulse: (!) 53 (!) 58 60 (!) 56  Resp: 23 18 18 18   Temp:    97.7 F (36.5 C)  TempSrc:    Oral  SpO2: 99% 100% 97% 100%  Weight:    77 kg (169 lb 11.2 oz)  Height:         General: Appears calm and comfortable and is NAD Eyes:  PERRL, EOMI, normal lids, iris ENT:  grossly normal hearing, lips & tongue, mmm Neck:  no LAD, masses or thyromegaly Cardiovascular:  RRR, mild bradycardia, no m/r/g. No LE edema.  Respiratory:  CTA bilaterally, no w/r/r. Normal respiratory effort. Abdomen:  soft, ntnd, NABS Skin:  no rash or induration seen on limited exam Musculoskeletal:  grossly normal tone BUE/BLE, good ROM, no bony abnormality Psychiatric:  grossly normal mood and affect, speech fluent and appropriate, AOx3, grandiose, somewhat manic (described her sex life in detail at one point, raved about how nice everyone here is...) Neurologic:  CN 2-12 grossly intact, moves all extremities in coordinated fashion, sensation intact  Labs on Admission: I have personally reviewed following labs and imaging studies  CBC:  Recent Labs Lab 05/18/16 1755  WBC 10.0  HGB 12.5  HCT 38.5  MCV 93.2  PLT 706   Basic Metabolic Panel:  Recent Labs Lab 05/18/16 1755  NA 139  K 4.4  CL 111  CO2 21*  GLUCOSE 88  BUN 15  CREATININE 1.34*  CALCIUM 10.7*   GFR: Estimated Creatinine Clearance: 28.5 mL/min (by C-G formula based on SCr of 1.34 mg/dL (H)). Liver Function Tests: No results for input(s): AST, ALT, ALKPHOS,  BILITOT, PROT, ALBUMIN in the last 168 hours. No results for input(s): LIPASE, AMYLASE in the last 168 hours. No results for input(s): AMMONIA in the last 168 hours. Coagulation Profile: No results for input(s): INR, PROTIME in the last 168 hours. Cardiac Enzymes:  Recent Labs Lab 05/18/16 2308  TROPONINI <0.03   BNP (last 3 results) No results for input(s): PROBNP in the last 8760 hours. HbA1C: No results for input(s): HGBA1C in the last 72 hours. CBG:  Recent Labs Lab 05/18/16 2303  GLUCAP 149*   Lipid Profile: No results for input(s): CHOL, HDL, LDLCALC, TRIG, CHOLHDL, LDLDIRECT in the last 72 hours. Thyroid Function Tests: No results for input(s): TSH, T4TOTAL, FREET4, T3FREE, THYROIDAB in the last 72 hours. Anemia Panel: No results for input(s): VITAMINB12, FOLATE, FERRITIN, TIBC, IRON, RETICCTPCT in the last 72 hours. Urine analysis:    Component Value Date/Time   COLORURINE YELLOW 05/01/2012 1913   APPEARANCEUR CLOUDY (A) 05/01/2012 1913   LABSPEC 1.006 05/01/2012 1913   PHURINE 7.0 05/01/2012 1913   GLUCOSEU NEGATIVE 05/01/2012 1913   HGBUR NEGATIVE 05/01/2012 1913   BILIRUBINUR NEGATIVE 05/01/2012 1913   KETONESUR NEGATIVE 05/01/2012 1913   PROTEINUR NEGATIVE 05/01/2012 1913   UROBILINOGEN 0.2 05/01/2012 1913   NITRITE NEGATIVE 05/01/2012 1913   LEUKOCYTESUR SMALL (A) 05/01/2012 1913    Creatinine Clearance: Estimated Creatinine Clearance: 28.5 mL/min (by C-G formula based on SCr of 1.34 mg/dL (H)).  Sepsis Labs: @LABRCNTIP (procalcitonin:4,lacticidven:4) )No results found for this or any previous visit (from the past 240 hour(s)).   Radiological Exams on Admission: Dg Chest 2 View  Result Date: 05/18/2016 CLINICAL DATA:  Chest pain EXAM: CHEST  2 VIEW COMPARISON:  Chest radiograph 06/25/2013 FINDINGS: Normal cardiomediastinal contours. No pleural effusion or pneumothorax. There  are diffusely increased pulmonary markings throughout both lungs. No focal  airspace consolidation or pulmonary edema. IMPRESSION: No acute cardiopulmonary disease. Electronically Signed   By: Ulyses Jarred M.D.   On: 05/18/2016 19:21    EKG: Independently reviewed.  NSR with rate 60; nonspecific ST changes with no evidence of acute ischemia  Assessment/Plan Principal Problem:   Chest pain Active Problems:   Chronic pain disorder   Hypothyroidism   Type 2 diabetes mellitus with vascular disease (HCC)   Hyperlipidemia   Bipolar affective disorder ([place])   Essential hypertension   Chest pain --Patient with substernal chest pain that came on at rest and was accompanied by other symptoms that resolved with Imdur/NTG - fairly typical for anginal pain. -CXR unremarkable.   -Initial cardiac enzymes negative.   -EKG not indicative of acute ischemia.   -TIMI risk score is 5; which predicts a 14 days risk of death, recurrent MI, or urgent revascularization of 26.2%.  -Will plan to place in observation status on telemetry to rule out ACS by overnight observation.  -cycle cardiac enzymes q6h x 3 and repeat EKG in AM -ASA PO daily -morphine given -Does not apparently take statin due to h/o myalgias with pravastatin -Hold testosterone/estrogen/Premarin - she very likely needs to discontinue these medications -Risk factor stratification with FLP and HgbA1c; will also check TSH -MPS on 03/31/16 that was negative, so seems unnecessary to repeat.  For definitive diagnosis, patient may benefit from catheterization. -Cardiology consultation in AM - Dr. Marlou Porch' group notified via Inbox message to the [place]  DM -[place] with SSI -Check A1c  HTN -Continue Altace -Follow  HLD -Does not appear to be taking statin therapy, presumably due to myalgias from Pravastatin in the past -Will check FLP -Suggest trial of another statin (pending results of lipid profile - she is 85 and so this may not be necessary)  Bipolar -Patient reports that she cured  herself and so she does not take medications -Takes Xanax TID prn -Appears hypomanic  Chronic pain -I have reviewed this patient in the [place] Controlled Substances Reporting System.  She is receiving medications from only one provider and appears to be taking them as prescribed.  Hypothyroidism -Check TSH -Continue Synthroid at current dose for now   DVT prophylaxis: Lovenox  Code Status: Full - confirmed with patient Family Communication: None present Disposition Plan:  Home once clinically improved Consults called: Cardiology (via [place] message)  Admission status: It is my clinical opinion that referral for OBSERVATION is reasonable and necessary in this patient based on the above information provided. The aforementioned taken together are felt to place the patient at high risk for further clinical deterioration. However it is anticipated that the patient may be medically stable for discharge from the hospital within 24 to 48 hours.     Karmen Bongo MD [facility]  If 7PM-7AM, please contact night-coverage [email or web address] Password [email or web address]  05/19/2016, 1:45 AM

## 2016-05-18 NOTE — ED Provider Notes (Signed)
[facility] DEPT Provider Note   CSN: [id number] Arrival date & time: 05/18/16  1745     History   Chief Complaint Chief Complaint  Patient presents with  . Chest Pain    HPI Kristine Oliver is a 82 y.o. female.  HPI  Pt with hx of CAD, HTN presenting with c/o chest pain.  She states this feels like her anginal symptoms that are worse than usual.  Pt states she had sternal chest pain that radiated to both shoulders and felt like a tightness. She took 4 nitroglycerin and the pain subsided.  Mild nausea and mild diaphoresis associated.  Symptoms occurred at rest.  She states she did not take her imdur this morning and feels this must be the reason for her symptoms.  EMS was called and she received 325mg  Aspirin.  There are no other associated systemic symptoms, there are no other alleviating or modifying factors.  She states she had recent stress test earlier this year that was normal.  She does have hx of cardiac stent in the past.    Past Medical History:  Diagnosis Date  . Bipolar affective disorder ([place])   . Bradycardia   . CAD (coronary artery disease) 02/12/2012   a. NSTEMI 2006:  2.7 x 28 mm Taxus stent to LAD 2006;  b.  Cath 3/12 Dr. Marlou Porch  Normal left main, patent LAD stents with jailed septal, 30% circumflex, 30% RCA    . Chronic diastolic heart failure ([place])    a. Echo 7/13:  mod LVH, EF 65-70%, vigorous LV, Gr 1 diast dysfn, mild LAE  . Chronic pain disorder   . DDD (degenerative disc disease), lumbosacral    , Spinal stenosis  . Diabetes mellitus ([place])    , Type II  . GERD (gastroesophageal reflux disease)   . HTN (hypertension)   . Hyperlipidemia   . Hyperthyroidism   . IBS (irritable bowel syndrome)   . Lumbar disc disease   . Non Q wave myocardial infarction ([place]) 2005  . Nonproliferative diabetic retinopathy associated with type 2 diabetes mellitus ([place])   . Obesity (BMI 30-39.9)   . Peripheral neuropathy (HCC)    , Associated with DM  . Scoliosis      Patient Active Problem List   Diagnosis Date Noted  . Chest pain 05/18/2016  . Bradycardia 09/26/2014  . Sinus bradycardia 10/30/2013  . Fatigue 10/30/2013  . Acute on chronic diastolic congestive heart failure ([place]) 06/26/2013  . Chest pain, unspecified 05/02/2012  . CAD (coronary artery disease) 02/12/2012  . Hypercalcemia 02/12/2012  . Chronic pain disorder   . Hypothyroidism   . Type 2 diabetes mellitus with vascular disease ([place])   . Hyperlipidemia   . Lumbar disc disease   . Obesity (BMI 30-39.9)   . Hypertensive heart disease without CHF   . Bipolar affective disorder ([place])   . Scoliosis     Past Surgical History:  Procedure Laterality Date  . BACK SURGERY    . CARDIAC CATHETERIZATION    . CHOLECYSTECTOMY    . DILATION AND CURETTAGE OF UTERUS    . HEMILAMINOTOMY LUMBAR SPINE  2002   Rt L5, with decompression and foraminotomy  . INDUCED ABORTION     , x2  . LAMINOTOMY Right   . LUMBAR DISC SURGERY  07/1999   L5-S1  . PILONIDAL CYST EXCISION    . ROTATOR CUFF REPAIR    . TONSILLECTOMY    . TOTAL ABDOMINAL HYSTERECTOMY W/ BILATERAL SALPINGOOPHORECTOMY  OB History    No data available       Home Medications    Prior to Admission medications   Medication Sig Start Date End Date Taking? Authorizing Provider  ALPRAZolam (XANAX) 0.25 MG tablet Take 0.25 mg by mouth 3 (three) times daily as needed. anxiety   Yes Historical Provider, MD  antiseptic oral rinse (BIOTENE) LIQD 15 mLs by Mouth Rinse route as needed for dry mouth.   Yes Historical Provider, MD  aspirin 81 MG tablet Take 81 mg by mouth daily.   Yes Historical Provider, MD  clindamycin (CLEOCIN T) 1 % lotion Apply 1 application topically daily. 11/16/13  Yes Historical Provider, MD  clopidogrel (PLAVIX) 75 MG tablet Take 75 mg by mouth daily.   Yes Historical Provider, MD  cycloSPORINE (RESTASIS) 0.05 % ophthalmic emulsion Place 1 drop into both eyes 2 (two) times daily.   Yes Historical  Provider, MD  estrogen-methylTESTOSTERone 0.625-1.25 MG per tablet Take 1 tablet by mouth daily.   Yes Historical Provider, MD  gabapentin (NEURONTIN) 300 MG capsule Take 300 mg by mouth 2 (two) times daily.    Yes Historical Provider, MD  isosorbide mononitrate (IMDUR) 30 MG 24 hr tablet Take 1/2-1 tablet daily. 03/26/16  Yes Isaiah Serge, NP  levothyroxine (SYNTHROID, LEVOTHROID) 75 MCG tablet Take 75 mcg by mouth daily.   Yes Historical Provider, MD  lidocaine (LIDODERM) 5 % Place 2 patches onto the skin as needed. Remove & Discard patch within 12 hours or as directed by MD   Yes Historical Provider, MD  metFORMIN (GLUCOPHAGE) 500 MG tablet Take 1 tablet by mouth 2 (two) times daily. 03/17/16  Yes Historical Provider, MD  nitroGLYCERIN (NITROSTAT) 0.4 MG SL tablet Place 1 tablet (0.4 mg total) under the tongue every 5 (five) minutes x 3 doses as needed for chest pain. 05/02/12  Yes Jerline Pain, MD  Oxycodone HCl 10 MG TABS Take 1 tablet by mouth 4 (four) times daily. 05/22/15  Yes Historical Provider, MD  PREMARIN vaginal cream Place 1 Applicatorful vaginally as needed.  10/06/13  Yes Historical Provider, MD  ramipril (ALTACE) 5 MG tablet Take 5 mg by mouth daily.   Yes Historical Provider, MD  valACYclovir (VALTREX) 500 MG tablet Take 1,000 mg by mouth daily.    Yes Historical Provider, MD  vitamin B-12 (CYANOCOBALAMIN) 1000 MCG tablet Take 1,000 mcg by mouth daily.   Yes Historical Provider, MD  zolpidem (AMBIEN) 10 MG tablet Take 10 mg by mouth at bedtime.   Yes Historical Provider, MD    Family History Family History  Problem Relation Age of Onset  . Heart failure Father   . Diabetes Father   . Coronary artery disease Mother     Social History Social History  Substance Use Topics  . Smoking status: Former Smoker    Years: 35.00  . Smokeless tobacco: Never Used  . Alcohol use No     Comment: social drinking, not often     Allergies   Pravastatin and Prednisone   Review of  Systems Review of Systems  ROS reviewed and all otherwise negative except for mentioned in HPI   Physical Exam Updated Vital Signs BP (!) 164/56 (BP Location: Right Arm)   Pulse (!) 56   Temp 97.7 F (36.5 C) (Oral)   Resp 18   Ht 4\' 11"  (1.499 m)   Wt 77 kg   SpO2 100%   BMI 34.28 kg/m  Vitals reviewed Physical Exam  Physical  Examination: General appearance - alert, well appearing, and in no distress Mental status - alert, oriented to person, place, and time Eyes - no conjunctival injection, no scleral icterus Mouth - mucous membranes moist, pharynx normal without lesions Neck - supple, no significant adenopathy Chest - clear to auscultation, no wheezes, rales or rhonchi, symmetric air entry Heart - normal rate, regular rhythm, normal S1, S2, no murmurs, rubs, clicks or gallops Abdomen - soft, nontender, nondistended, no masses or organomegaly Neurological - alert, oriented, normal speech Extremities - peripheral pulses normal, no pedal edema, no clubbing or cyanosis Skin - normal coloration and turgor, no rashes   ED Treatments / Results  Labs (all labs ordered are listed, but only abnormal results are displayed) Labs Reviewed  BASIC METABOLIC PANEL - Abnormal; Notable for the following:       Result Value   CO2 21 (*)    Creatinine, Ser 1.34 (*)    Calcium 10.7 (*)    GFR calc non Af Amer 36 (*)    GFR calc Af Amer 41 (*)    All other components within normal limits  GLUCOSE, CAPILLARY - Abnormal; Notable for the following:    Glucose-Capillary 149 (*)    All other components within normal limits  CBC  TROPONIN I  TROPONIN I  TROPONIN I  I-STAT TROPOININ, ED    EKG  EKG Interpretation  Date/Time:  Monday May 18 2016 17:52:54 EDT Ventricular Rate:  60 PR Interval:    QRS Duration: 88 QT Interval:  409 QTC Calculation: 409 R Axis:   -9 Text Interpretation:  Sinus or ectopic atrial rhythm Prolonged PR interval Abnormal T, consider ischemia, lateral  leads t wave inversions in lateral leads are more prominent Confirmed by Canary Brim  MD, MARTHA [id number]) on 05/18/2016 8:18:49 PM       Radiology Dg Chest 2 View  Result Date: 05/18/2016 CLINICAL DATA:  Chest pain EXAM: CHEST  2 VIEW COMPARISON:  Chest radiograph 06/25/2013 FINDINGS: Normal cardiomediastinal contours. No pleural effusion or pneumothorax. There are diffusely increased pulmonary markings throughout both lungs. No focal airspace consolidation or pulmonary edema. IMPRESSION: No acute cardiopulmonary disease. Electronically Signed   By: Ulyses Jarred M.D.   On: 05/18/2016 19:21    Procedures Procedures (including critical care time)  Medications Ordered in ED Medications  cycloSPORINE (RESTASIS) 0.05 % ophthalmic emulsion 1 drop (not administered)  isosorbide mononitrate (IMDUR) 24 hr tablet 15 mg (not administered)  aspirin EC tablet 81 mg (not administered)  ALPRAZolam (XANAX) tablet 0.25 mg (not administered)  clopidogrel (PLAVIX) tablet 75 mg (not administered)  gabapentin (NEURONTIN) capsule 300 mg (300 mg Oral Given 05/18/16 2327)  levothyroxine (SYNTHROID, LEVOTHROID) tablet 75 mcg (not administered)  ramipril (ALTACE) capsule 5 mg (not administered)  valACYclovir (VALTREX) tablet 1,000 mg (not administered)  zolpidem (AMBIEN) tablet 5 mg (5 mg Oral Given 05/18/16 2327)  insulin aspart (novoLOG) injection 0-15 Units (not administered)  enoxaparin (LOVENOX) injection 30 mg (not administered)  morphine 2 MG/ML injection 2 mg (not administered)  gi cocktail (Maalox,Lidocaine,Donnatal) (not administered)  acetaminophen (TYLENOL) tablet 650 mg (not administered)  ondansetron (ZOFRAN) injection 4 mg (not administered)  oxyCODONE (Oxy IR/ROXICODONE) immediate release tablet 10 mg (10 mg Oral Given 05/18/16 2359)  oxyCODONE-acetaminophen (PERCOCET/ROXICET) 5-325 MG per tablet 1 tablet (1 tablet Oral Given 05/18/16 1948)     Initial Impression / Assessment and Plan / ED  Course  I have reviewed the triage vital signs and the nursing notes.  Pertinent labs &  imaging results that were available during my care of the patient were reviewed by me and considered in my medical decision making (see chart for details).  Clinical Course    Pt presenting with c/o chest pain that is most c/w anginal chest pain.  Pt with hx of CAD s/p cardiac stenting.  Recent negative stress test.  She is chest pain free in the ED and troponin is negative.  TWI in lateral leads are more prominent compared to prior EKG, pt admitted to triad for further management and evaluation.    Final Clinical Impressions(s) / ED Diagnoses   Final diagnoses:  Unstable angina [facility])    New Prescriptions Current Discharge Medication List       Alfonzo Beers, MD 05/19/16 0005

## 2016-05-18 NOTE — ED Triage Notes (Signed)
Onset today chest pain took own nitro total of 4 tabs. States did not take her Imdur today as schedule and thinks that is why she has chest pain. States had a busy day today and forgot.  Currently pain 3/10 pressure achy tightness.  Arrived via EMS EKG NSR. Alert answering and following commands appropriate.

## 2016-05-18 NOTE — ED Notes (Signed)
Attempted to give report floor nurse to call back

## 2016-05-18 NOTE — Progress Notes (Signed)
Attempted to call R.N. In E.R. She is unavailable at this time

## 2016-05-19 DIAGNOSIS — R0789 Other chest pain: Secondary | ICD-10-CM | POA: Diagnosis not present

## 2016-05-19 DIAGNOSIS — G8929 Other chronic pain: Secondary | ICD-10-CM | POA: Diagnosis not present

## 2016-05-19 DIAGNOSIS — I10 Essential (primary) hypertension: Secondary | ICD-10-CM | POA: Diagnosis not present

## 2016-05-19 DIAGNOSIS — I200 Unstable angina: Secondary | ICD-10-CM | POA: Diagnosis not present

## 2016-05-19 DIAGNOSIS — E7800 Pure hypercholesterolemia, unspecified: Secondary | ICD-10-CM | POA: Diagnosis not present

## 2016-05-19 DIAGNOSIS — R072 Precordial pain: Secondary | ICD-10-CM | POA: Diagnosis not present

## 2016-05-19 DIAGNOSIS — G894 Chronic pain syndrome: Secondary | ICD-10-CM | POA: Diagnosis not present

## 2016-05-19 DIAGNOSIS — E039 Hypothyroidism, unspecified: Secondary | ICD-10-CM | POA: Diagnosis not present

## 2016-05-19 DIAGNOSIS — E785 Hyperlipidemia, unspecified: Secondary | ICD-10-CM | POA: Diagnosis not present

## 2016-05-19 LAB — LIPID PANEL
Cholesterol: 162 mg/dL (ref 0–200)
HDL: 31 mg/dL — ABNORMAL LOW (ref >40)
LDL CALC: 103 mg/dL — AB (ref 0–99)
TRIGLYCERIDES: 138 mg/dL (ref <150)
Total CHOL/HDL Ratio: 5.2 RATIO
VLDL: 28 mg/dL (ref 0–40)

## 2016-05-19 LAB — TSH: TSH: 1.903 u[IU]/mL (ref 0.350–4.500)

## 2016-05-19 LAB — GLUCOSE, CAPILLARY: Glucose-Capillary: 89 mg/dL (ref 65–99)

## 2016-05-19 MED ORDER — ISOSORBIDE MONONITRATE ER 30 MG PO TB24
30.0000 mg | ORAL_TABLET | Freq: Every day | ORAL | Status: DC
  Administered 2016-05-19: 30 mg via ORAL
  Filled 2016-05-19: qty 1

## 2016-05-19 MED ORDER — RAMIPRIL 10 MG PO CAPS: 10.0000 mg | ORAL_CAPSULE | Freq: Every day | ORAL | 1 refills | Status: DC

## 2016-05-19 MED ORDER — RAMIPRIL 10 MG PO CAPS
10.0000 mg | ORAL_CAPSULE | Freq: Every day | ORAL | Status: DC
  Administered 2016-05-19: 10 mg via ORAL
  Filled 2016-05-19: qty 1

## 2016-05-19 MED ORDER — OXYCODONE HCL 5 MG PO TABS
5.0000 mg | ORAL_TABLET | Freq: Once | ORAL | Status: AC
  Administered 2016-05-19: 5 mg via ORAL
  Filled 2016-05-19: qty 1

## 2016-05-19 MED ORDER — ISOSORBIDE MONONITRATE ER 30 MG PO TB24: 30.0000 mg | ORAL_TABLET | Freq: Every day | ORAL | 1 refills | Status: DC

## 2016-05-19 NOTE — Progress Notes (Signed)
Order received to discharge patient.  Patient expresses readiness to discharge.  Telemetry monitor removed and CCMD notified.  PIV access removed without difficulty.  Discharge instructions, follow up, medications and instructions for their use were discussed with patient and patient voiced understanding.  

## 2016-05-19 NOTE — Progress Notes (Signed)
Nutrition Brief Note  Patient identified on the Malnutrition Screening Tool (MST) Report.  Wt Readings from Last 15 Encounters:  05/19/16 169 lb 11.2 oz (77 kg)  04/10/16 175 lb (79.4 kg)  03/26/16 172 lb 12.8 oz (78.4 kg)  11/25/15 173 lb 12.8 oz (78.8 kg)  09/26/14 171 lb 6.4 oz (77.7 kg)  07/19/14 177 lb (80.3 kg)  01/03/14 183 lb 9.6 oz (83.3 kg)  11/29/13 186 lb (84.4 kg)  11/01/13 186 lb (84.4 kg)  10/30/13 186 lb (84.4 kg)  06/25/13 192 lb 3.2 oz (87.2 kg)  06/01/13 191 lb (86.6 kg)  05/01/12 197 lb 4.8 oz (89.5 kg)  02/15/12 199 lb 11.8 oz (90.6 kg)  02/10/12 205 lb (93 kg)    Body mass index is 34.28 kg/m. Patient meets criteria for Obesity Class I based on current BMI.   Current diet order is Heart Healthy/Carbohydrate Modifie, patient is consuming approximately 100% of meals at this time. Labs and medications reviewed.   No nutrition interventions warranted at this time. If nutrition issues arise, please consult RD.   Arthur Holms, RD, LDN Pager #: [phone] After-Hours Pager #: [phone]

## 2016-05-19 NOTE — Discharge Summary (Signed)
Physician Discharge Summary  Kristine Oliver MRN: [id number] DOB/AGE: 11/24/1930 82 y.o.  PCP: Irven Shelling, MD   Admit date: 05/18/2016 Discharge date: 05/19/2016  Discharge Diagnoses:    Principal Problem:   Chest pain Active Problems:   Chronic pain disorder   Hypothyroidism   Type 2 diabetes mellitus with vascular disease ([place])   Hyperlipidemia   Bipolar affective disorder ([place])   Essential hypertension    Follow-up recommendations Follow-up with PCP in 3-5 days , including all  additional recommended appointments as below Follow-up CBC, CMP in 3-5 days Follow-up with cardiology as recommended       Current Discharge Medication List    START taking these medications   Details  ramipril (ALTACE) 10 MG capsule Take 1 capsule (10 mg total) by mouth daily. Qty: 30 capsule, Refills: 1      CONTINUE these medications which have CHANGED   Details  isosorbide mononitrate (IMDUR) 30 MG 24 hr tablet Take 1 tablet (30 mg total) by mouth daily. Qty: 30 tablet, Refills: 1      CONTINUE these medications which have NOT CHANGED   Details  ALPRAZolam (XANAX) 0.25 MG tablet Take 0.25 mg by mouth 3 (three) times daily as needed. anxiety    antiseptic oral rinse (BIOTENE) LIQD 15 mLs by Mouth Rinse route as needed for dry mouth.    aspirin 81 MG tablet Take 81 mg by mouth daily.    clindamycin (CLEOCIN T) 1 % lotion Apply 1 application topically daily.    clopidogrel (PLAVIX) 75 MG tablet Take 75 mg by mouth daily.    cycloSPORINE (RESTASIS) 0.05 % ophthalmic emulsion Place 1 drop into both eyes 2 (two) times daily.    estrogen-methylTESTOSTERone 0.625-1.25 MG per tablet Take 1 tablet by mouth daily.    gabapentin (NEURONTIN) 300 MG capsule Take 300 mg by mouth 2 (two) times daily.     levothyroxine (SYNTHROID, LEVOTHROID) 75 MCG tablet Take 75 mcg by mouth daily.    lidocaine (LIDODERM) 5 % Place 2 patches onto the skin as needed. Remove & Discard patch  within 12 hours or as directed by MD    metFORMIN (GLUCOPHAGE) 500 MG tablet Take 1 tablet by mouth 2 (two) times daily. Refills: 0    nitroGLYCERIN (NITROSTAT) 0.4 MG SL tablet Place 1 tablet (0.4 mg total) under the tongue every 5 (five) minutes x 3 doses as needed for chest pain. Qty: 30 tablet, Refills: 12    Oxycodone HCl 10 MG TABS Take 1 tablet by mouth 4 (four) times daily. Refills: 0    PREMARIN vaginal cream Place 1 Applicatorful vaginally as needed.     valACYclovir (VALTREX) 500 MG tablet Take 1,000 mg by mouth daily.     vitamin B-12 (CYANOCOBALAMIN) 1000 MCG tablet Take 1,000 mcg by mouth daily.    zolpidem (AMBIEN) 10 MG tablet Take 10 mg by mouth at bedtime.      STOP taking these medications     ramipril (ALTACE) 5 MG tablet          Discharge Condition: Stable    Discharge Instructions Get Medicines reviewed and adjusted: Please take all your medications with you for your next visit with your Primary MD  Please request your Primary MD to go over all hospital tests and procedure/radiological results at the follow up, please ask your Primary MD to get all Hospital records sent to his/her office.  If you experience worsening of your admission symptoms, develop shortness of breath, life threatening  emergency, suicidal or homicidal thoughts you must seek medical attention immediately by calling 911 or calling your MD immediately if symptoms less severe.  You must read complete instructions/literature along with all the possible adverse reactions/side effects for all the Medicines you take and that have been prescribed to you. Take any new Medicines after you have completely understood and accpet all the possible adverse reactions/side effects.   Do not drive when taking Pain medications.   Do not take more than prescribed Pain, Sleep and Anxiety Medications  Special Instructions: If you have smoked or chewed Tobacco in the last 2 yrs please stop smoking,  stop any regular Alcohol and or any Recreational drug use.  Wear Seat belts while driving.  Please note  You were cared for by a hospitalist during your hospital stay. Once you are discharged, your primary care physician will handle any further medical issues. Please note that NO REFILLS for any discharge medications will be authorized once you are discharged, as it is imperative that you return to your primary care physician (or establish a relationship with a primary care physician if you do not have one) for your aftercare needs so that they can reassess your need for medications and monitor your lab values.  Discharge Instructions    Diet - low sodium heart healthy    Complete by:  As directed    Increase activity slowly    Complete by:  As directed        Allergies  Allergen Reactions  . Pravastatin Other (See Comments)    Muscle aches  . Prednisone Nausea And Vomiting      Disposition: 01-Home or Self Care   Consults:  Cardiology     Significant Diagnostic Studies:  Dg Chest 2 View  Result Date: 05/18/2016 CLINICAL DATA:  Chest pain EXAM: CHEST  2 VIEW COMPARISON:  Chest radiograph 06/25/2013 FINDINGS: Normal cardiomediastinal contours. No pleural effusion or pneumothorax. There are diffusely increased pulmonary markings throughout both lungs. No focal airspace consolidation or pulmonary edema. IMPRESSION: No acute cardiopulmonary disease. Electronically Signed   By: Ulyses Jarred M.D.   On: 05/18/2016 19:21       Filed Weights   05/18/16 1755 05/18/16 2305 05/19/16 0514  Weight: 77.1 kg (170 lb) 77 kg (169 lb 11.2 oz) 77 kg (169 lb 11.2 oz)     Microbiology: No results found for this or any previous visit (from the past 240 hour(s)).     Blood Culture No results found for: SDES, [place], CULT, REPTSTATUS    Labs: Results for orders placed or performed during the hospital encounter of 05/18/16 (from the past 48 hour(s))  Basic metabolic panel      Status: Abnormal   Collection Time: 05/18/16  5:55 PM  Result Value Ref Range   Sodium 139 135 - 145 mmol/L   Potassium 4.4 3.5 - 5.1 mmol/L   Chloride 111 101 - 111 mmol/L   CO2 21 (L) 22 - 32 mmol/L   Glucose, Bld 88 65 - 99 mg/dL   BUN 15 6 - 20 mg/dL   Creatinine, Ser 1.34 (H) 0.44 - 1.00 mg/dL   Calcium 10.7 (H) 8.9 - 10.3 mg/dL   GFR calc non Af Amer 36 (L) >60 mL/min   GFR calc Af Amer 41 (L) >60 mL/min    Comment: (NOTE) The eGFR has been calculated using the CKD EPI equation. This calculation has not been validated in all clinical situations. eGFR's persistently <60 mL/min signify possible Chronic  Kidney Disease.    Anion gap 7 5 - 15  CBC     Status: None   Collection Time: 05/18/16  5:55 PM  Result Value Ref Range   WBC 10.0 4.0 - 10.5 K/uL   RBC 4.13 3.87 - 5.11 MIL/uL   Hemoglobin 12.5 12.0 - 15.0 g/dL   HCT 38.5 36.0 - 46.0 %   MCV 93.2 78.0 - 100.0 fL   MCH 30.3 26.0 - 34.0 pg   MCHC 32.5 30.0 - 36.0 g/dL   RDW 15.5 11.5 - 15.5 %   Platelets 225 150 - 400 K/uL  I-stat troponin, ED     Status: None   Collection Time: 05/18/16  6:04 PM  Result Value Ref Range   Troponin i, poc 0.01 0.00 - 0.08 ng/mL   Comment 3            Comment: Due to the release kinetics of cTnI, a negative result within the first hours of the onset of symptoms does not rule out myocardial infarction with certainty. If myocardial infarction is still suspected, repeat the test at appropriate intervals.   Glucose, capillary     Status: Abnormal   Collection Time: 05/18/16 11:03 PM  Result Value Ref Range   Glucose-Capillary 149 (H) 65 - 99 mg/dL  Troponin I-serum (0, 3, 6 hours)     Status: None   Collection Time: 05/18/16 11:08 PM  Result Value Ref Range   Troponin I <0.03 <0.03 ng/mL  Lipid panel     Status: Abnormal   Collection Time: 05/19/16  5:16 AM  Result Value Ref Range   Cholesterol 162 0 - 200 mg/dL   Triglycerides 138 <150 mg/dL   HDL 31 (L) >40 mg/dL   Total  CHOL/HDL Ratio 5.2 RATIO   VLDL 28 0 - 40 mg/dL   LDL Cholesterol 103 (H) 0 - 99 mg/dL    Comment:        Total Cholesterol/HDL:CHD Risk Coronary Heart Disease Risk Table                     Men   Women  1/2 Average Risk   3.4   3.3  Average Risk       5.0   4.4  2 X Average Risk   9.6   7.1  3 X Average Risk  23.4   11.0        Use the calculated Patient Ratio above and the CHD Risk Table to determine the patient's CHD Risk.        ATP III CLASSIFICATION (LDL):  <100     mg/dL   Optimal  100-129  mg/dL   Near or Above                    Optimal  130-159  mg/dL   Borderline  160-189  mg/dL   High  >190     mg/dL   Very High   TSH     Status: None   Collection Time: 05/19/16  5:16 AM  Result Value Ref Range   TSH 1.903 0.350 - 4.500 uIU/mL    Comment: Performed by a 3rd Generation assay with a functional sensitivity of <=0.01 uIU/mL.  Glucose, capillary     Status: None   Collection Time: 05/19/16  6:17 AM  Result Value Ref Range   Glucose-Capillary 89 65 - 99 mg/dL     Lipid Panel     Component Value Date/Time  CHOL 162 05/19/2016 0516   TRIG 138 05/19/2016 0516   HDL 31 (L) 05/19/2016 0516   CHOLHDL 5.2 05/19/2016 0516   VLDL 28 05/19/2016 0516   LDLCALC 103 (H) 05/19/2016 0516     Lab Results  Component Value Date   HGBA1C 6.7 (H) 02/13/2012   HGBA1C (H) 10/26/2010    6.8 (NOTE)                                                                       According to the ADA Clinical Practice Recommendations for 2011, when HbA1c is used as a screening test:   >=6.5%   Diagnostic of Diabetes Mellitus           (if abnormal result  is confirmed)  5.7-6.4%   Increased risk of developing Diabetes Mellitus  References:Diagnosis and Classification of Diabetes Mellitus,Diabetes OACZ,6606,30(ZSWFU 1):S62-S69 and Standards of Medical Care in         Diabetes - 2011,Diabetes Care,2011,34  (Suppl 1):S11-S61.     Lab Results  Component Value Date   LDLCALC 103 (H)  05/19/2016   CREATININE 1.34 (H) 05/18/2016     HPI :  Kristine Oliver is a 82 y.o. 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   She has a hx of CAD with myocardial infarction in 2006 treated with DES to [facility] followup catheterization in 10/2010 showing patent LAD stent with jailed septal, 30% circumflex, 30% RCA, normal EF.  She also a hx of bradycardia and she wore a 48-hour Holter monitor 2015 which demonstrated chronotropic incompetence as well as sinus pauses with average heart rate of 57 beats per minute ranging from 46-80. Dr. Rayann Heman recommended PPM butshe canceled her procedure.  She was seen by Cecilie Kicks NP in the office on 03/26/16 for evaluation of chest pain and fatigue. Imdur was added. A stress test was ordered which was low risk.   She was in her usual state of health until yesterday while sitting at the car dealership to have her car inspected. She had forgot to take her imdur that AM. While she was sitting there she had sudden onset of intense chest pressure like someone was "sitting on her chest." There was radiation to both arms and into her throat. This eased off with 2 SL NTG. She drove home and felt nauseated and diaphoretic. When she got home she laid in bed and again had another episode of intense chest pressure and she called EMS. She took more SL NTG as well as her imdur that she missed that AM. Since being transported to the hospital she has felt much better with no recurrence of chest pain. No LE edema, orthopnea or PND. No dizziness or syncope. No palpitations.    HOSPITAL COURSE:   Chest pain --Patient with substernal chest pain that came on at rest and was accompanied by other symptoms that resolved with Imdur/NTG - fairly typical for  anginal pain. -CXR unremarkable.  -Initial cardiac enzymes negative.  -EKG not indicative of acute ischemia.  -TIMI risk score is  5; -cardiac enzymes cycled and were found to be negative -ASA PO daily -morphine given -Does not apparently take statin due to h/o myalgias with pravastatin -Hold testosterone/estrogen/Premarin - she very likely needs to discontinue these medications A1c pending, LDL 103  -MPS on 03/31/16 that was negative, so seems unnecessary to repeat.  For definitive diagnosis, patient may benefit from catheterization. -Cardiology consultation obtained, patient seen by - Dr. Marlou Porch' recent nuclear stress test was low risk, no evidence of ischemia, normal EF, cardiology increase the dose of Imdur and ramipril. Okay to discharge from a cardiac standpoint    DM Resume home medications   HTN -Continue Altace, dose increased  -Follow  HLD -Does not appear to be taking statin therapy, presumably due to myalgias from Pravastatin in the past -Will check FLP -Suggest trial of another statin (pending results of lipid profile - she is 54 and so this may not be necessary)  Bipolar -Patient reports that she cured herself and so she does not take medications -Takes Xanax TID prn -Appears hypomanic  Chronic pain -I have reviewed this patient in the [place] Controlled Substances Reporting System.  She is receiving medications from only one provider and appears to be taking them as prescribed.  Hypothyroidism  TSH 1.9 -Continue Synthroid at current dose for now  Obesity: diet and weight loss recommended. Body mass index is 34.75 kg/m.   Discharge Exam:   Blood pressure (!) 185/60, pulse 60, temperature 97.5 F (36.4 C), temperature source Oral, resp. rate 18, height '4\' 11"'  (1.499 m), weight 77 kg (169 lb 11.2 oz), SpO2 100 %.    General: Appears calm and comfortable and is NAD  Eyes:  PERRL, EOMI, normal lids, iris  ENT:  grossly normal hearing, lips & tongue,  mmm  Neck:  no LAD, masses or thyromegaly  Cardiovascular:  RRR, mild bradycardia, no m/r/g. No LE edema.   Respiratory:  CTA bilaterally, no w/r/r. Normal respiratory effort.  Abdomen:  soft, ntnd, NABS  Skin:  no rash or induration seen on limited exam  Musculoskeletal:  grossly normal tone BUE/BLE, good ROM, no bony abnormality  Psychiatric:  grossly normal mood and affect, speech fluent and appropriate, AOx3, grandiose, somewhat manic (described her sex life in detail at one point, raved about how nice everyone here is...)  Neurologic:  CN 2-12 grossly intact, moves all extremities in coordinated fashion, sensation intact     Signed: Connell Bognar 05/19/2016, 8:37 AM        Time spent >45 mins

## 2016-05-19 NOTE — Consult Note (Signed)
CARDIOLOGY CONSULT NOTE   Patient ID: Kristine Oliver MRN: [id number] DOB/AGE: June 08, 1931 82 y.o.  Admit date: 05/18/2016  Requesting Physician: Dr. Lorin Mercy Primary Physician:   Irven Shelling, MD Primary Cardiologist:  Dr. Marlou Porch Reason for Consultation:   Chest pain  HPI: Kristine Oliver is a 82 y.o. female with a history of CAD s/p DES to LAD (2006), chronic diastolic CHF, CKD, HTN, HLD, obesity, bipolar disorder, chronic pain on narcotics and symptomatic sinus brady with chronotropic incompetence (declined PPM) who presented to [facility] ER last night with chest pain.   She has a hx of CAD with myocardial infarction in 2006 treated with DES to LAD with followup catheterization in 10/2010 showing patent LAD stent with jailed septal, 30% circumflex, 30% RCA, normal EF.  She also a hx of bradycardia and she wore a 48-hour Holter monitor 2015 which demonstrated chronotropic incompetence as well as sinus pauses with average heart rate of 57 beats per minute ranging from 46-80. Dr. Rayann Heman recommended PPM but she canceled her procedure.  She was seen by Cecilie Kicks NP in the office on 03/26/16 for evaluation of chest pain and fatigue. Imdur was added. A stress test was ordered which was low risk.  I saw her in follow up on 04/10/16. She was feel much better on imdur and no further work up was persured.   She was in her usual state of health until yesterday while sitting at the car dealership to have her car inspected. She had forgot to take her imdur that AM. While she was sitting there she had sudden onset of intense chest pressure like someone was "sitting on her chest." There was radiation to both arms and into her throat. This eased off with 2 SL NTG. She drove home and felt nauseated and diaphoretic. When she got home she laid in bed and again had another episode of intense chest pressure and she called EMS. She took more SL NTG as well as her imdur that she missed that AM. Since being  transported to the hospital she has felt much better with no recurrence of chest pain. No LE edema, orthopnea or PND. No dizziness or syncope. No palpitations.    Past Medical History:  Diagnosis Date  . Bipolar affective disorder ([place])   . Bradycardia   . CAD (coronary artery disease) 02/12/2012   a. NSTEMI 2006:  2.7 x 28 mm Taxus stent to LAD 2006;  b.  Cath 3/12 Dr. Marlou Porch  Normal left main, patent LAD stents with jailed septal, 30% circumflex, 30% RCA    . Chronic diastolic heart failure ([place])    a. Echo 7/13:  mod LVH, EF 65-70%, vigorous LV, Gr 1 diast dysfn, mild LAE  . Chronic pain disorder   . DDD (degenerative disc disease), lumbosacral    , Spinal stenosis  . Diabetes mellitus ([place])    , Type II  . GERD (gastroesophageal reflux disease)   . HTN (hypertension)   . Hyperlipidemia   . Hyperthyroidism   . IBS (irritable bowel syndrome)   . Lumbar disc disease   . Non Q wave myocardial infarction ([place]) 2005  . Nonproliferative diabetic retinopathy associated with type 2 diabetes mellitus ([place])   . Obesity (BMI 30-39.9)   . Peripheral neuropathy ([facility])    , Associated with DM  . Scoliosis      Past Surgical History:  Procedure Laterality Date  . BACK SURGERY    . CARDIAC CATHETERIZATION    .  CHOLECYSTECTOMY    . DILATION AND CURETTAGE OF UTERUS    . HEMILAMINOTOMY LUMBAR SPINE  2002   Rt L5, with decompression and foraminotomy  . INDUCED ABORTION     , x2  . LAMINOTOMY Right   . LUMBAR DISC SURGERY  07/1999   L5-S1  . PILONIDAL CYST EXCISION    . ROTATOR CUFF REPAIR    . TONSILLECTOMY    . TOTAL ABDOMINAL HYSTERECTOMY W/ BILATERAL SALPINGOOPHORECTOMY      Allergies  Allergen Reactions  . Pravastatin Other (See Comments)    Muscle aches  . Prednisone Nausea And Vomiting    I have reviewed the patient's current medications . aspirin EC  81 mg Oral Daily  . clopidogrel  75 mg Oral Daily  . cycloSPORINE  1 drop Both Eyes BID  . enoxaparin (LOVENOX)  injection  30 mg Subcutaneous Daily  . gabapentin  300 mg Oral BID  . insulin aspart  0-15 Units Subcutaneous TID WC  . isosorbide mononitrate  15 mg Oral Daily  . levothyroxine  75 mcg Oral QAC breakfast  . oxyCODONE  10 mg Oral TID AC & HS  . ramipril  5 mg Oral Daily  . valACYclovir  1,000 mg Oral Daily  . zolpidem  5 mg Oral QHS     acetaminophen, ALPRAZolam, gi cocktail, morphine injection, ondansetron (ZOFRAN) IV  Prior to Admission medications   Medication Sig Start Date End Date Taking? Authorizing Provider  ALPRAZolam (XANAX) 0.25 MG tablet Take 0.25 mg by mouth 3 (three) times daily as needed. anxiety   Yes Historical Provider, MD  antiseptic oral rinse (BIOTENE) LIQD 15 mLs by Mouth Rinse route as needed for dry mouth.   Yes Historical Provider, MD  aspirin 81 MG tablet Take 81 mg by mouth daily.   Yes Historical Provider, MD  clindamycin (CLEOCIN T) 1 % lotion Apply 1 application topically daily. 11/16/13  Yes Historical Provider, MD  clopidogrel (PLAVIX) 75 MG tablet Take 75 mg by mouth daily.   Yes Historical Provider, MD  cycloSPORINE (RESTASIS) 0.05 % ophthalmic emulsion Place 1 drop into both eyes 2 (two) times daily.   Yes Historical Provider, MD  estrogen-methylTESTOSTERone 0.625-1.25 MG per tablet Take 1 tablet by mouth daily.   Yes Historical Provider, MD  gabapentin (NEURONTIN) 300 MG capsule Take 300 mg by mouth 2 (two) times daily.    Yes Historical Provider, MD  isosorbide mononitrate (IMDUR) 30 MG 24 hr tablet Take 1/2-1 tablet daily. 03/26/16  Yes Isaiah Serge, NP  levothyroxine (SYNTHROID, LEVOTHROID) 75 MCG tablet Take 75 mcg by mouth daily.   Yes Historical Provider, MD  lidocaine (LIDODERM) 5 % Place 2 patches onto the skin as needed. Remove & Discard patch within 12 hours or as directed by MD   Yes Historical Provider, MD  metFORMIN (GLUCOPHAGE) 500 MG tablet Take 1 tablet by mouth 2 (two) times daily. 03/17/16  Yes Historical Provider, MD  nitroGLYCERIN  (NITROSTAT) 0.4 MG SL tablet Place 1 tablet (0.4 mg total) under the tongue every 5 (five) minutes x 3 doses as needed for chest pain. 05/02/12  Yes Jerline Pain, MD  Oxycodone HCl 10 MG TABS Take 1 tablet by mouth 4 (four) times daily. 05/22/15  Yes Historical Provider, MD  PREMARIN vaginal cream Place 1 Applicatorful vaginally as needed.  10/06/13  Yes Historical Provider, MD  ramipril (ALTACE) 5 MG tablet Take 5 mg by mouth daily.   Yes Historical Provider, MD  valACYclovir (VALTREX)  500 MG tablet Take 1,000 mg by mouth daily.    Yes Historical Provider, MD  vitamin B-12 (CYANOCOBALAMIN) 1000 MCG tablet Take 1,000 mcg by mouth daily.   Yes Historical Provider, MD  zolpidem (AMBIEN) 10 MG tablet Take 10 mg by mouth at bedtime.   Yes Historical Provider, MD   Current Meds Scheduled Meds: . aspirin EC  81 mg Oral Daily  . clopidogrel  75 mg Oral Daily  . cycloSPORINE  1 drop Both Eyes BID  . enoxaparin (LOVENOX) injection  30 mg Subcutaneous Daily  . gabapentin  300 mg Oral BID  . insulin aspart  0-15 Units Subcutaneous TID WC  . isosorbide mononitrate  30 mg Oral Daily  . levothyroxine  75 mcg Oral QAC breakfast  . oxyCODONE  10 mg Oral TID AC & HS  . ramipril  5 mg Oral Daily  . valACYclovir  1,000 mg Oral Daily  . zolpidem  5 mg Oral QHS   Continuous Infusions:  PRN Meds:.acetaminophen, ALPRAZolam, gi cocktail, morphine injection, ondansetron (ZOFRAN) IV    Social History   Social History  . Marital status: Divorced    Spouse name: N/A  . Number of children: N/A  . Years of education: N/A   Occupational History  . retired    Social History Main Topics  . Smoking status: Former Smoker    Years: 35.00  . Smokeless tobacco: Never Used  . Alcohol use No     Comment: social drinking, not often  . Drug use: No  . Sexual activity: Not on file   Other Topics Concern  . Not on file   Social History Narrative   Divorced.    Family Status  Relation Status  . Father  Deceased at age 41   died of stroke  . Mother Deceased at age 59   died of old age, had CABG at age 48  . Sister Alive  . Maternal Grandmother Deceased  . Maternal Grandfather Deceased  . Paternal Grandmother Deceased  . Paternal Grandfather Deceased   Family History  Problem Relation Age of Onset  . Heart failure Father   . Diabetes Father   . Coronary artery disease Mother     ROS:  Full 14 point review of systems complete and found to be negative unless listed above.  Physical Exam: Blood pressure (!) 185/60, pulse 60, temperature 97.5 F (36.4 C), temperature source Oral, resp. rate 18, height 4\' 11"  (1.499 m), weight 169 lb 11.2 oz (77 kg), SpO2 100 %.  General: Well developed, well nourished, female in no acute distress, obese Head: Eyes PERRLA, No xanthomas.   Normocephalic and atraumatic, oropharynx without edema or exudate.  Lungs: CTAB Heart: HRRR S1 S2, no rub/gallop, Heart regular rate and rhythm with S1, S2  murmur. pulses are 2+ extrem.   Neck: No carotid bruits. No lymphadenopathy.  No JVD. Abdomen: Bowel sounds present, abdomen soft and non-tender without masses or hernias noted. Msk:  No spine or cva tenderness. No weakness, no joint deformities or effusions. Extremities: No clubbing or cyanosis.  No LE edema.  Neuro: Alert and oriented X 3. No focal deficits noted. Psych:  Good affect, responds appropriately Skin: No rashes or lesions noted.  Labs:   Lab Results  Component Value Date   WBC 10.0 05/18/2016   HGB 12.5 05/18/2016   HCT 38.5 05/18/2016   MCV 93.2 05/18/2016   PLT 225 05/18/2016   No results for input(s): INR in the last 72  hours.  Recent Labs Lab 05/18/16 1755  NA 139  K 4.4  CL 111  CO2 21*  BUN 15  CREATININE 1.34*  CALCIUM 10.7*  GLUCOSE 88   Magnesium  Date Value Ref Range Status  10/26/2010 1.8 1.5 - 2.5 mg/dL Final    Recent Labs  05/18/16 2308  TROPONINI <0.03    Recent Labs  05/18/16 1804  TROPIPOC 0.01    Pro B Natriuretic peptide (BNP)  Date/Time Value Ref Range Status  06/25/2013 06:32 PM 1,083.0 (H) 0 - 450 pg/mL Final  05/01/2012 09:12 PM 504.7 (H) 0 - 450 pg/mL Final   Lab Results  Component Value Date   CHOL 162 05/19/2016   HDL 31 (L) 05/19/2016   LDLCALC 103 (H) 05/19/2016   TRIG 138 05/19/2016   No results found for: DDIMER Lipase  Date/Time Value Ref Range Status  02/18/2008 02:00 AM 29  Final   TSH  Date/Time Value Ref Range Status  05/19/2016 05:16 AM 1.903 0.350 - 4.500 uIU/mL Final    Comment:    Performed by a 3rd Generation assay with a functional sensitivity of <=0.01 uIU/mL.  10/30/2013 03:24 PM 1.37 0.35 - 5.50 uIU/mL Final   No results found for: VITAMINB12, FOLATE, FERRITIN, TIBC, IRON, RETICCTPCT  Nuclear stress test 03/31/16 Study Highlights   Nuclear stress EF: 57%.  There was no ST segment deviation noted during stress.  The study is normal.  This is a low risk study.  The left ventricular ejection fraction is normal (55-65%).    ECHO 06/2013 Study Conclusions - Left ventricle: The cavity size was normal. There was mild focal basal hypertrophy of the septum. Systolic function was normal. The estimated ejection fraction was in the range of 60% to 65%. Wall motion was normal; there were no regional wall motion abnormalities. There was an increased relative contribution of atrial contraction to ventricular filling. Doppler parameters are consistent with abnormal left ventricular relaxation (grade 1 diastolic dysfunction). - Mitral valve: Mild regurgitation. - Left atrium: The atrium was mildly dilated   ECG:  Sinus HR 60. TWI in lead I and AVL that are similar to previous tracings. ECG this am with normalization of TWI in AVL.  Radiology:  Dg Chest 2 View  Result Date: 05/18/2016 CLINICAL DATA:  Chest pain EXAM: CHEST  2 VIEW COMPARISON:  Chest radiograph 06/25/2013 FINDINGS: Normal cardiomediastinal contours. No  pleural effusion or pneumothorax. There are diffusely increased pulmonary markings throughout both lungs. No focal airspace consolidation or pulmonary edema. IMPRESSION: No acute cardiopulmonary disease. Electronically Signed   By: Ulyses Jarred M.D.   On: 05/18/2016 19:21    ASSESSMENT AND PLAN:    Principal Problem:   Chest pain Active Problems:   Chronic pain disorder   Hypothyroidism   Type 2 diabetes mellitus with vascular disease (HCC)   Hyperlipidemia   Bipolar affective disorder ([place])   Essential hypertension  Chest pain: now resolved. She had a recent normal nuclear stress test in 03/2016. She had been doing a lot better on imdur and then forgot to take this yesterday AM. She had over an hour of intense chest pressure that is worrisome for angina but interestingly her troponin has remained negative and ECG with some non specific lateral changes that have improved. No objective signs of ischemia at this point and she is feeling better. Will defer to Dr. Marlou Porch on whether we will pursue continued observation or further ischemic work up.   CAD s/p DES to LAD: stable.  Continue ASA/plavix, imdur. Intolerant to statins. No BB due to bradycardia  Hx of sinus brady and chronotropic intolerance: offered PPM in past but decline. She has been doing well. Avoid AVN blocking agents.   HTN: BP has been elevated. Will increase imdur from 15--> 30mg  daily. We may need to increase Ramipril as well.   Obesity: diet and weight loss recommended. Body mass index is 34.75 kg/m.    Signed: Angelena Form, PA-C 05/19/2016 6:43 AM  Pager [phone]  Co-Sign MD  Personally seen and examined. Agree with above.  Recent NUC stress low risk, no ischemia Normal EF Imdur increased. At home forgot dose>>>CP HTN - increased ramipril CAD - last cath LAD stent patent Bradycardia stable  Reg brady, lungs clear.   OK with DC home. Trop normal.   [name], MD

## 2016-05-22 ENCOUNTER — Telehealth: Payer: Self-pay | Admitting: Cardiology

## 2016-05-22 NOTE — Telephone Encounter (Signed)
Spoke with nephew (Andy-DPR) at length RE: pt, her Hx, recent hospitalization, d/c medications and her s/s now.  Jonni Sanger reports today the pt did not eat breakfast before taking her medications and has become very fatigued and had some dizziness throughout the day.  He reports she did increase her Imdur to 30 mg a day but has not increased her Ramipril to 10 as instructed because she has not been to the pharmacy to pick it up.  This afternoon they went to the movies but had to come home before seeing the movie d/t fatigue and dizziness.  Vitals this PM has been from 142/52 to 99/48 with HR ranging from 55 to 60 bpm.  Varney Daily to continue to monitor VS but would do so only about 1 hour after her AM medications and if any further s/s.  Explained how isosorbide works and that I would expect to see a decrease in BP with the increased dose.  Advised to make sure she is eating appropriately especially since she is diabetic and to make she sure is staying hydrated.  Advised to hold off on starting the increased dose of Ramipril at this time.  He is aware to contact MD on call this weekend if continued concerns.  Aware I will review with Dr Marlou Porch and call back on Monday to f/u.  He states understanding and thanked me for my time and explaination/information.

## 2016-05-22 NOTE — Telephone Encounter (Signed)
New Message  Pt c/o medication issue:  1. Name of Medication: Imdur   2. How are you currently taking this medication (dosage and times per day)?  30mg   3. Are you having a reaction (difficulty breathing--STAT)? very Fatigue, dizzy   4. What is your medication issue? Pt nephew request to speak with RN. Pt nephew states started syptoms after taking med. Pt nephew take pt out and she started to feel really tired so he brought her back home. Pt nephew would like to speak with Rn to discuss. Please call back to discuss

## 2016-05-24 NOTE — Telephone Encounter (Signed)
Agree. Do not take increased ramipril (continue with older dose, 5mg ) If BP continues to be low, OK to reduce Imdur back to 15 as well.  Thanks  [name], MD

## 2016-05-25 NOTE — Telephone Encounter (Signed)
Spoke with pt who reports feeling very tired.  She has decreased her Imdur to 15 mg a day and has remained on the 5 mg of Ramipril.  Not further changes at this time.  She will c/b if further issues.

## 2016-09-30 ENCOUNTER — Other Ambulatory Visit: Payer: Self-pay | Admitting: *Deleted

## 2016-09-30 MED ORDER — ISOSORBIDE MONONITRATE ER 30 MG PO TB24: 15.0000 mg | ORAL_TABLET | Freq: Every day | ORAL | 1 refills | Status: DC

## 2017-03-20 ENCOUNTER — Other Ambulatory Visit: Payer: Self-pay | Admitting: Cardiology

## 2017-05-06 ENCOUNTER — Encounter (INDEPENDENT_AMBULATORY_CARE_PROVIDER_SITE_OTHER): Payer: Self-pay

## 2017-05-06 ENCOUNTER — Encounter: Payer: Self-pay | Admitting: Cardiology

## 2017-05-06 ENCOUNTER — Ambulatory Visit (INDEPENDENT_AMBULATORY_CARE_PROVIDER_SITE_OTHER): Payer: Medicare Other | Admitting: Cardiology

## 2017-05-06 VITALS — BP 140/70 | HR 65 | Resp 16 | Ht 59.0 in | Wt 171.0 lb

## 2017-05-06 DIAGNOSIS — I10 Essential (primary) hypertension: Secondary | ICD-10-CM

## 2017-05-06 DIAGNOSIS — I5032 Chronic diastolic (congestive) heart failure: Secondary | ICD-10-CM

## 2017-05-06 DIAGNOSIS — R001 Bradycardia, unspecified: Secondary | ICD-10-CM | POA: Diagnosis not present

## 2017-05-06 DIAGNOSIS — I2510 Atherosclerotic heart disease of native coronary artery without angina pectoris: Secondary | ICD-10-CM

## 2017-05-06 DIAGNOSIS — E669 Obesity, unspecified: Secondary | ICD-10-CM | POA: Diagnosis not present

## 2017-05-06 MED ORDER — FUROSEMIDE 20 MG PO TABS: 20.0000 mg | ORAL_TABLET | Freq: Every day | ORAL | 3 refills | Status: DC | PRN

## 2017-05-06 NOTE — Patient Instructions (Signed)
Medication Instructions:  Your physician has recommended you make the following change in your medication:  1.  Start taking furosemide 20 mg (one tablet) by mouth once a day as needed.  Do not take more than one tablet a day. Indications:  Take for increasing shortness of breath, if you have increasing edema, if you have a weight gain of 3-5 pounds.    Labwork: None ordered.  Testing/Procedures: Your physician has requested that you have an echocardiogram. Echocardiography is a painless test that uses sound waves to create images of your heart. It provides your doctor with information about the size and shape of your heart and how well your heart's chambers and valves are working. This procedure takes approximately one hour. There are no restrictions for this procedure.  Please schedule for an echocardiogram.  Follow-Up: Your physician recommends that you schedule a follow-up appointment in with Dr. Marlou Porch or one of his APP's within 3-4 months.   Any Other Special Instructions Will Be Listed Below (If Applicable).   Heart Failure Heart failure means your heart has trouble pumping blood. This makes it hard for your body to work well. Heart failure is usually a long-term (chronic) condition. You must take good care of yourself and follow your doctor's treatment plan. Follow these instructions at home:  Take your heart medicine as told by your doctor. ? Do not stop taking medicine unless your doctor tells you to. ? Do not skip any dose of medicine. ? Refill your medicines before they run out. ? Take other medicines only as told by your doctor or pharmacist.  Stay active if told by your doctor. The elderly and people with severe heart failure should talk with a doctor about physical activity.  Eat heart-healthy foods. Choose foods that are without trans fat and are low in saturated fat, cholesterol, and salt (sodium). This includes fresh or frozen fruits and vegetables, fish, lean meats,  fat-free or low-fat dairy foods, whole grains, and high-fiber foods. Lentils and dried peas and beans (legumes) are also good choices.  Limit salt if told by your doctor.  Cook in a healthy way. Roast, grill, broil, bake, poach, steam, or stir-fry foods.  Limit fluids as told by your doctor.  Weigh yourself every morning. Do this after you pee (urinate) and before you eat breakfast. Write down your weight to give to your doctor.  Take your blood pressure and write it down if your doctor tells you to.  Ask your doctor how to check your pulse. Check your pulse as told.  Lose weight if told by your doctor.  Stop smoking or chewing tobacco. Do not use gum or patches that help you quit without your doctor's approval.  Schedule and go to doctor visits as told.  Nonpregnant women should have no more than 1 drink a day. Men should have no more than 2 drinks a day. Talk to your doctor about drinking alcohol.  Stop illegal drug use.  Stay current with shots (immunizations).  Manage your health conditions as told by your doctor.  Learn to manage your stress.  Rest when you are tired.  If it is really hot outside: ? Avoid intense activities. ? Use air conditioning or fans, or get in a cooler place. ? Avoid caffeine and alcohol. ? Wear loose-fitting, lightweight, and light-colored clothing.  If it is really cold outside: ? Avoid intense activities. ? Layer your clothing. ? Wear mittens or gloves, a hat, and a scarf when going outside. ? Avoid  alcohol.  Learn about heart failure and get support as needed.  Get help to maintain or improve your quality of life and your ability to care for yourself as needed. Contact a doctor if:  You gain weight quickly.  You are more short of breath than usual.  You cannot do your normal activities.  You tire easily.  You cough more than normal, especially with activity.  You have any or more puffiness (swelling) in areas such as your  hands, feet, ankles, or belly (abdomen).  You cannot sleep because it is hard to breathe.  You feel like your heart is beating fast (palpitations).  You get dizzy or light-headed when you stand up. Get help right away if:  You have trouble breathing.  There is a change in mental status, such as becoming less alert or not being able to focus.  You have chest pain or discomfort.  You faint. This information is not intended to replace advice given to you by your health care provider. Make sure you discuss any questions you have with your health care provider. Document Released: 04/28/2008 Document Revised: 12/26/2015 Document Reviewed: 09/05/2012 Elsevier Interactive Patient Education  2017 [employer].    Low-Sodium Eating Plan Sodium, which is an element that makes up salt, helps you maintain a healthy balance of fluids in your body. Too much sodium can increase your blood pressure and cause fluid and waste to be held in your body. Your health care provider or dietitian may recommend following this plan if you have high blood pressure (hypertension), kidney disease, liver disease, or heart failure. Eating less sodium can help lower your blood pressure, reduce swelling, and protect your heart, liver, and kidneys. What are tips for following this plan? General guidelines  Most people on this plan should limit their sodium intake to 1,500-2,000 mg (milligrams) of sodium each day. Reading food labels  The Nutrition Facts label lists the amount of sodium in one serving of the food. If you eat more than one serving, you must multiply the listed amount of sodium by the number of servings.  Choose foods with less than 140 mg of sodium per serving.  Avoid foods with 300 mg of sodium or more per serving. Shopping  Look for lower-sodium products, often labeled as "low-sodium" or "no salt added."  Always check the sodium content even if foods are labeled as "unsalted" or "no salt  added".  Buy fresh foods. ? Avoid canned foods and premade or frozen meals. ? Avoid canned, cured, or processed meats  Buy breads that have less than 80 mg of sodium per slice. Cooking  Eat more home-cooked food and less restaurant, buffet, and fast food.  Avoid adding salt when cooking. Use salt-free seasonings or herbs instead of table salt or sea salt. Check with your health care provider or pharmacist before using salt substitutes.  Cook with plant-based oils, such as canola, sunflower, or olive oil. Meal planning  When eating at a restaurant, ask that your food be prepared with less salt or no salt, if possible.  Avoid foods that contain MSG (monosodium glutamate). MSG is sometimes added to [place] food, bouillon, and some canned foods. What foods are recommended? The items listed may not be a complete list. Talk with your dietitian about what dietary choices are best for you. Grains Low-sodium cereals, including oats, puffed wheat and rice, and shredded wheat. Low-sodium crackers. Unsalted rice. Unsalted pasta. Low-sodium bread. Whole-grain breads and whole-grain pasta. Vegetables Fresh or frozen vegetables. "  No salt added" canned vegetables. "No salt added" tomato sauce and paste. Low-sodium or reduced-sodium tomato and vegetable juice. Fruits Fresh, frozen, or canned fruit. Fruit juice. Meats and other protein foods Fresh or frozen (no salt added) meat, poultry, seafood, and fish. Low-sodium canned tuna and salmon. Unsalted nuts. Dried peas, beans, and lentils without added salt. Unsalted canned beans. Eggs. Unsalted nut butters. Dairy Milk. Soy milk. Cheese that is naturally low in sodium, such as ricotta cheese, fresh mozzarella, or Swiss cheese Low-sodium or reduced-sodium cheese. Cream cheese. Yogurt. Fats and oils Unsalted butter. Unsalted margarine with no trans fat. Vegetable oils such as canola or olive oils. Seasonings and other foods Fresh and dried herbs and  spices. Salt-free seasonings. Low-sodium mustard and ketchup. Sodium-free salad dressing. Sodium-free light mayonnaise. Fresh or refrigerated horseradish. Lemon juice. Vinegar. Homemade, reduced-sodium, or low-sodium soups. Unsalted popcorn and pretzels. Low-salt or salt-free chips. What foods are not recommended? The items listed may not be a complete list. Talk with your dietitian about what dietary choices are best for you. Grains Instant hot cereals. Bread stuffing, pancake, and biscuit mixes. Croutons. Seasoned rice or pasta mixes. Noodle soup cups. Boxed or frozen macaroni and cheese. Regular salted crackers. Self-rising flour. Vegetables Sauerkraut, pickled vegetables, and relishes. Olives. [place] fries. Onion rings. Regular canned vegetables (not low-sodium or reduced-sodium). Regular canned tomato sauce and paste (not low-sodium or reduced-sodium). Regular tomato and vegetable juice (not low-sodium or reduced-sodium). Frozen vegetables in sauces. Meats and other protein foods Meat or fish that is salted, canned, smoked, spiced, or pickled. Bacon, ham, sausage, hotdogs, corned beef, chipped beef, packaged lunch meats, salt pork, jerky, pickled herring, anchovies, regular canned tuna, sardines, salted nuts. Dairy Processed cheese and cheese spreads. Cheese curds. Blue cheese. Feta cheese. String cheese. Regular cottage cheese. Buttermilk. Canned milk. Fats and oils Salted butter. Regular margarine. Ghee. Bacon fat. Seasonings and other foods Onion salt, garlic salt, seasoned salt, table salt, and sea salt. Canned and packaged gravies. Worcestershire sauce. Tartar sauce. Barbecue sauce. Teriyaki sauce. Soy sauce, including reduced-sodium. Steak sauce. Fish sauce. Oyster sauce. Cocktail sauce. Horseradish that you find on the shelf. Regular ketchup and mustard. Meat flavorings and tenderizers. Bouillon cubes. Hot sauce and Tabasco sauce. Premade or packaged marinades. Premade or packaged taco  seasonings. Relishes. Regular salad dressings. Salsa. Potato and tortilla chips. Corn chips and puffs. Salted popcorn and pretzels. Canned or dried soups. Pizza. Frozen entrees and pot pies. Summary  Eating less sodium can help lower your blood pressure, reduce swelling, and protect your heart, liver, and kidneys.  Most people on this plan should limit their sodium intake to 1,500-2,000 mg (milligrams) of sodium each day.  Canned, boxed, and frozen foods are high in sodium. Restaurant foods, fast foods, and pizza are also very high in sodium. You also get sodium by adding salt to food.  Try to cook at home, eat more fresh fruits and vegetables, and eat less fast food, canned, processed, or prepared foods. This information is not intended to replace advice given to you by your health care provider. Make sure you discuss any questions you have with your health care provider. Document Released: 01/09/2002 Document Revised: 07/13/2016 Document Reviewed: 07/13/2016 Elsevier Interactive Patient Education  2017 [employer].    If you need a refill on your cardiac medications before your next appointment, please call your pharmacy.

## 2017-05-06 NOTE — Progress Notes (Signed)
Cardiology Office Note:    Date:  05/06/2017   ID:  MONITA SWIER, DOB [birth year], MRN [id number]  PCP:  Lavone Orn, MD  Cardiologist:  Dr. Marlou Porch  Referring MD: Lavone Orn, MD   Chief Complaint  Patient presents with  . Shortness of Breath    History of Present Illness:    Kristine Oliver is a 83 y.o. female with a past medical history significant for CAD S/P DES to LAD (2006), chronic diastolic CHF, CKD, HTN, HLD, obesity and symptomatic bradycardia with chronotropic incompetence (declined PPM). She is here today for annual follow-up.    She has a history of CAD with myocardial infarction in 2006 treated with DES to LAD with follow-up catheterization in 10/2010 showing patent LAD stent was jailed septal, 30% circumflex, 30% RCA, normal EF.   She also has a history of bradycardia and she wore a 48 hour Holter monitor in 2015 which demonstrated chronotropic incompetence as well as sinus pauses with average heart rate of 57 bpm ranging from 46-80. Dr. Rayann Heman recommended PPM but she canceled her procedure.  The patient did have some complaints of chest pain and 03/2016 and subsequently had a normal, low risk Myoview. She was started on isosorbide which Radiated her chest discomfort.  She was last seen in the office by Nell Range, PA on 04/10/2016 at which time the patient was doing well without any chest pain, dyspnea or other complaints. She did have occasional dizziness but no syncope..  She is here today alone after being seen yesterday by her PCP with volume overload. The patient reports that 2 days ago she noted her blood pressure to be in the 90s/40's which was low for her and she also had shortness of breath with walking, mild swelling and orthopnea. She had noted an increase in her weight about 6 pounds. She attributes all of this to her recent eating of lots of potato chips and tomatoes that she had gotten from the former smoker with lots of salt. Her PCP yesterday gave her  one dose of Lasix to take and she states that she urinated all last evening. She had no orthopnea last night and her symptoms are resolved today. She was able to walk in from the parking lot without shortness of breath. She says that her weight is down 5 pounds today. Her PCP took blood and urine yesterday. An EKG at her PCP office showed bradycardia with a heart rate of 42 bpm. The patient says that she was dizzy when she was having symptoms 2 days ago but she has had no dizziness today. She feels better and feels that her symptoms were related to overuse of salt.  The patient usually walks almost daily at [facility] and prior to this week she had had no exertional chest discomfort or shortness of breath..  Past Medical History:  Diagnosis Date  . Bipolar affective disorder ([place])   . Bradycardia   . CAD (coronary artery disease) 02/12/2012   a. NSTEMI 2006:  2.7 x 28 mm Taxus stent to LAD 2006;  b.  Cath 3/12 Dr. Marlou Porch  Normal left main, patent LAD stents with jailed septal, 30% circumflex, 30% RCA    . Chronic diastolic heart failure ([place])    a. Echo 7/13:  mod LVH, EF 65-70%, vigorous LV, Gr 1 diast dysfn, mild LAE  . Chronic pain disorder   . DDD (degenerative disc disease), lumbosacral    , Spinal stenosis  . Diabetes mellitus ([place])    ,  Type II  . GERD (gastroesophageal reflux disease)   . HTN (hypertension)   . Hyperlipidemia   . Hyperthyroidism   . IBS (irritable bowel syndrome)   . Lumbar disc disease   . Non Q wave myocardial infarction ([place]) 2005  . Nonproliferative diabetic retinopathy associated with type 2 diabetes mellitus ([place])   . Obesity (BMI 30-39.9)   . Peripheral neuropathy    , Associated with DM  . Scoliosis     Past Surgical History:  Procedure Laterality Date  . BACK SURGERY    . CARDIAC CATHETERIZATION    . CHOLECYSTECTOMY    . DILATION AND CURETTAGE OF UTERUS    . HEMILAMINOTOMY LUMBAR SPINE  2002   Rt L5, with decompression and foraminotomy  .  INDUCED ABORTION     , x2  . LAMINOTOMY Right   . LUMBAR DISC SURGERY  07/1999   L5-S1  . PILONIDAL CYST EXCISION    . ROTATOR CUFF REPAIR    . TONSILLECTOMY    . TOTAL ABDOMINAL HYSTERECTOMY W/ BILATERAL SALPINGOOPHORECTOMY      Current Medications: Current Meds  Medication Sig  . ALPRAZolam (XANAX) 0.25 MG tablet Take 0.25 mg by mouth 3 (three) times daily as needed. anxiety  . antiseptic oral rinse (BIOTENE) LIQD 15 mLs by Mouth Rinse route as needed for dry mouth.  Marland Kitchen aspirin 81 MG tablet Take 81 mg by mouth daily.  . clindamycin (CLEOCIN T) 1 % lotion Apply 1 application topically daily.  . clopidogrel (PLAVIX) 75 MG tablet Take 75 mg by mouth daily.  . cycloSPORINE (RESTASIS) 0.05 % ophthalmic emulsion Place 1 drop into both eyes 2 (two) times daily.  Marland Kitchen estrogen-methylTESTOSTERone 0.625-1.25 MG per tablet Take 1 tablet by mouth daily.  Marland Kitchen gabapentin (NEURONTIN) 300 MG capsule Take 300 mg by mouth 2 (two) times daily.   . isosorbide mononitrate (IMDUR) 30 MG 24 hr tablet TAKE 1/2 TABLET BY MOUTH ONCE DAILY  . levothyroxine (SYNTHROID, LEVOTHROID) 75 MCG tablet Take 75 mcg by mouth daily.  Marland Kitchen lidocaine (LIDODERM) 5 % Place 2 patches onto the skin as needed. Remove & Discard patch within 12 hours or as directed by MD  . metFORMIN (GLUCOPHAGE) 500 MG tablet Take 1 tablet by mouth 2 (two) times daily.  . nitroGLYCERIN (NITROSTAT) 0.4 MG SL tablet Place 1 tablet (0.4 mg total) under the tongue every 5 (five) minutes x 3 doses as needed for chest pain.  Marland Kitchen oxyCODONE (OXY IR/ROXICODONE) 5 MG immediate release tablet Take 5 mg by mouth at bedtime.  . Oxycodone HCl 10 MG TABS Take 1 tablet by mouth 4 (four) times daily.  Marland Kitchen PREMARIN vaginal cream Place 1 Applicatorful vaginally as needed.   . ramipril (ALTACE) 10 MG capsule Take 1 capsule (10 mg total) by mouth daily.  . valACYclovir (VALTREX) 500 MG tablet Take 1,000 mg by mouth daily.   . vitamin B-12 (CYANOCOBALAMIN) 1000 MCG tablet Take  1,000 mcg by mouth daily.  Marland Kitchen zolpidem (AMBIEN) 10 MG tablet Take 10 mg by mouth at bedtime.     Allergies:   Pravastatin and Prednisone   Social History   Social History  . Marital status: Divorced    Spouse name: N/A  . Number of children: N/A  . Years of education: N/A   Occupational History  . retired    Social History Main Topics  . Smoking status: Former Smoker    Years: 35.00  . Smokeless tobacco: Never Used  . Alcohol use No  Comment: social drinking, not often  . Drug use: No  . Sexual activity: Not Asked   Other Topics Concern  . None   Social History Narrative   Divorced.     Family History: The patient's family history includes Coronary artery disease in her mother; Diabetes in her father; Heart failure in her father. ROS:   Please see the history of present illness.     All other systems reviewed and are negative.  EKGs/Labs/Other Studies Reviewed:    The following studies were reviewed today:  Nuclear stress test 03/31/2016 Study Highlights   Nuclear stress EF: 57%.  There was no ST segment deviation noted during stress.  The study is normal.  This is a low risk study.  The left ventricular ejection fraction is normal (55-65%).   Echocardiogram 06/26/2013 Study Conclusions - Left ventricle: The cavity size was normal. There was mild focal basal hypertrophy of the septum. Systolic function was normal. The estimated ejection fraction was in the range of 60% to 65%. Wall motion was normal; there were no regional wall motion abnormalities. There was an increased relative contribution of atrial contraction to ventricular filling. Doppler parameters are consistent with abnormal left ventricular relaxation (grade 1 diastolic dysfunction). - Mitral valve: Mild regurgitation. - Left atrium: The atrium was mildly dilated.  EKG:  EKG is ordered today.  The ekg ordered today demonstrates sinus rhythm at 63 bpm  Recent  Labs: 05/18/2016: BUN 15; Creatinine, Ser 1.34; Hemoglobin 12.5; Platelets 225; Potassium 4.4; Sodium 139 05/19/2016: TSH 1.903   Recent Lipid Panel    Component Value Date/Time   CHOL 162 05/19/2016 0516   TRIG 138 05/19/2016 0516   HDL 31 (L) 05/19/2016 0516   CHOLHDL 5.2 05/19/2016 0516   VLDL 28 05/19/2016 0516   LDLCALC 103 (H) 05/19/2016 0516    Physical Exam:    VS:  BP 140/70 (BP Location: Right Arm, Patient Position: Sitting, Cuff Size: Normal)   Pulse 65   Resp 16   Ht 4\' 11"  (1.499 m)   Wt 171 lb (77.6 kg)   SpO2 96%   BMI 34.54 kg/m     Wt Readings from Last 3 Encounters:  05/06/17 171 lb (77.6 kg)  05/19/16 169 lb 11.2 oz (77 kg)  04/10/16 175 lb (79.4 kg)     Physical Exam  Constitutional: She is oriented to person, place, and time. She appears well-developed and well-nourished.  HENT:  Head: Normocephalic and atraumatic.  Neck: Normal range of motion. Neck supple. No JVD present.  Cardiovascular: Normal rate, regular rhythm and normal heart sounds.   Pulmonary/Chest: Effort normal and breath sounds normal. No respiratory distress. She has no wheezes. She has no rales.  Abdominal: Soft. Bowel sounds are normal. She exhibits no distension.  Musculoskeletal: Normal range of motion. She exhibits no edema.  Recent using rolling walker  Neurological: She is alert and oriented to person, place, and time.  Skin: Skin is warm and dry.  Psychiatric: She has a normal mood and affect. Her behavior is normal. Thought content normal.     ASSESSMENT:    1. Chronic diastolic heart failure ([place])   2. Sinus bradycardia   3. Coronary artery disease involving native coronary artery of native heart without angina pectoris   4. Essential hypertension   5. Obesity (BMI 30-39.9)    PLAN:    In order of problems listed above:  Chronic diastolic heart failure: The patient had episode of fluid overload 2 days ago with  DOE, orthopnea, increased weight of about 6 pounds  after dietary indiscretions with excess salt intake on potato chips and tomatoes. She was given one dose of Lasix at her primary care yesterday with weight down 5 pounds today and much improvement in her dyspnea. I will recheck echocardiogram to assess LV function and provide Lasix 20 mg as needed for increased weight, edema or shortness of breath. Patient is advised to limit her sodium intake, weight daily and report any symptoms that do not respond to Lasix.   CAD: History of MI in 2006 with DES to LAD. Cath in 2012 showed patent stent in other nonobstructive disease. Patient was placed on Imdur in 2017 for chest pain. She had a low risk Myoview at that time. She denies any recent chest pain.  History of sinus bradycardia and chronotropic intolerance: Heart rate was noted to be 42 bpm yesterday at her PCP. Today she is in sinus rhythm at 63 bpm. She had some lightheadedness/dizziness 2 days ago when she was fluid overloaded but reports that has resolved, none today. I discussed her past diagnosis of chronotropic intolerance and plan for pacemaker in the past which she had ultimately declined. She states that she does not have frequent symptoms and is not inclined to revisit a pacemaker at this time. Avoiding AV nodal blocking agents. Patient will let us know if she develops symptoms.   Hypertension: Controlled on current regimen.  Obesity: Patient is walking most days of the week at [facility] however she is limited by physical joint issues. She uses a rolling walker. He is trying to follow a heart healthy diet and she has cut her portion sizes.  Medication Adjustments/Labs and Tests Ordered: Current medicines are reviewed at length with the patient today.  Concerns regarding medicines are outlined above. Labs and tests ordered and medication changes are outlined in the patient instructions below:  Patient Instructions  Medication Instructions:  Your physician has recommended you make the  following change in your medication:  1.  Start taking furosemide 20 mg (one tablet) by mouth once a day as needed.  Do not take more than one tablet a day. Indications:  Take for increasing shortness of breath, if you have increasing edema, if you have a weight gain of 3-5 pounds.    Labwork: None ordered.  Testing/Procedures: Your physician has requested that you have an echocardiogram. Echocardiography is a painless test that uses sound waves to create images of your heart. It provides your doctor with information about the size and shape of your heart and how well your heart's chambers and valves are working. This procedure takes approximately one hour. There are no restrictions for this procedure.  Please schedule for an echocardiogram.  Follow-Up: Your physician recommends that you schedule a follow-up appointment in with Dr. Marlou Porch or one of his APP's within 3-4 months.   Any Other Special Instructions Will Be Listed Below (If Applicable).   Heart Failure Heart failure means your heart has trouble pumping blood. This makes it hard for your body to work well. Heart failure is usually a long-term (chronic) condition. You must take good care of yourself and follow your doctor's treatment plan. Follow these instructions at home:  Take your heart medicine as told by your doctor. ? Do not stop taking medicine unless your doctor tells you to. ? Do not skip any dose of medicine. ? Refill your medicines before they run out. ? Take other medicines only as told by your doctor or  pharmacist.  Stay active if told by your doctor. The elderly and people with severe heart failure should talk with a doctor about physical activity.  Eat heart-healthy foods. Choose foods that are without trans fat and are low in saturated fat, cholesterol, and salt (sodium). This includes fresh or frozen fruits and vegetables, fish, lean meats, fat-free or low-fat dairy foods, whole grains, and high-fiber foods.  Lentils and dried peas and beans (legumes) are also good choices.  Limit salt if told by your doctor.  Cook in a healthy way. Roast, grill, broil, bake, poach, steam, or stir-fry foods.  Limit fluids as told by your doctor.  Weigh yourself every morning. Do this after you pee (urinate) and before you eat breakfast. Write down your weight to give to your doctor.  Take your blood pressure and write it down if your doctor tells you to.  Ask your doctor how to check your pulse. Check your pulse as told.  Lose weight if told by your doctor.  Stop smoking or chewing tobacco. Do not use gum or patches that help you quit without your doctor's approval.  Schedule and go to doctor visits as told.  Nonpregnant women should have no more than 1 drink a day. Men should have no more than 2 drinks a day. Talk to your doctor about drinking alcohol.  Stop illegal drug use.  Stay current with shots (immunizations).  Manage your health conditions as told by your doctor.  Learn to manage your stress.  Rest when you are tired.  If it is really hot outside: ? Avoid intense activities. ? Use air conditioning or fans, or get in a cooler place. ? Avoid caffeine and alcohol. ? Wear loose-fitting, lightweight, and light-colored clothing.  If it is really cold outside: ? Avoid intense activities. ? Layer your clothing. ? Wear mittens or gloves, a hat, and a scarf when going outside. ? Avoid alcohol.  Learn about heart failure and get support as needed.  Get help to maintain or improve your quality of life and your ability to care for yourself as needed. Contact a doctor if:  You gain weight quickly.  You are more short of breath than usual.  You cannot do your normal activities.  You tire easily.  You cough more than normal, especially with activity.  You have any or more puffiness (swelling) in areas such as your hands, feet, ankles, or belly (abdomen).  You cannot sleep because it  is hard to breathe.  You feel like your heart is beating fast (palpitations).  You get dizzy or light-headed when you stand up. Get help right away if:  You have trouble breathing.  There is a change in mental status, such as becoming less alert or not being able to focus.  You have chest pain or discomfort.  You faint. This information is not intended to replace advice given to you by your health care provider. Make sure you discuss any questions you have with your health care provider. Document Released: 04/28/2008 Document Revised: 12/26/2015 Document Reviewed: 09/05/2012 Elsevier Interactive Patient Education  2017 [employer].    Low-Sodium Eating Plan Sodium, which is an element that makes up salt, helps you maintain a healthy balance of fluids in your body. Too much sodium can increase your blood pressure and cause fluid and waste to be held in your body. Your health care provider or dietitian may recommend following this plan if you have high blood pressure (hypertension), kidney disease, liver disease, or  heart failure. Eating less sodium can help lower your blood pressure, reduce swelling, and protect your heart, liver, and kidneys. What are tips for following this plan? General guidelines  Most people on this plan should limit their sodium intake to 1,500-2,000 mg (milligrams) of sodium each day. Reading food labels  The Nutrition Facts label lists the amount of sodium in one serving of the food. If you eat more than one serving, you must multiply the listed amount of sodium by the number of servings.  Choose foods with less than 140 mg of sodium per serving.  Avoid foods with 300 mg of sodium or more per serving. Shopping  Look for lower-sodium products, often labeled as "low-sodium" or "no salt added."  Always check the sodium content even if foods are labeled as "unsalted" or "no salt added".  Buy fresh foods. ? Avoid canned foods and premade or frozen  meals. ? Avoid canned, cured, or processed meats  Buy breads that have less than 80 mg of sodium per slice. Cooking  Eat more home-cooked food and less restaurant, buffet, and fast food.  Avoid adding salt when cooking. Use salt-free seasonings or herbs instead of table salt or sea salt. Check with your health care provider or pharmacist before using salt substitutes.  Cook with plant-based oils, such as canola, sunflower, or olive oil. Meal planning  When eating at a restaurant, ask that your food be prepared with less salt or no salt, if possible.  Avoid foods that contain MSG (monosodium glutamate). MSG is sometimes added to [place] food, bouillon, and some canned foods. What foods are recommended? The items listed may not be a complete list. Talk with your dietitian about what dietary choices are best for you. Grains Low-sodium cereals, including oats, puffed wheat and rice, and shredded wheat. Low-sodium crackers. Unsalted rice. Unsalted pasta. Low-sodium bread. Whole-grain breads and whole-grain pasta. Vegetables Fresh or frozen vegetables. "No salt added" canned vegetables. "No salt added" tomato sauce and paste. Low-sodium or reduced-sodium tomato and vegetable juice. Fruits Fresh, frozen, or canned fruit. Fruit juice. Meats and other protein foods Fresh or frozen (no salt added) meat, poultry, seafood, and fish. Low-sodium canned tuna and salmon. Unsalted nuts. Dried peas, beans, and lentils without added salt. Unsalted canned beans. Eggs. Unsalted nut butters. Dairy Milk. Soy milk. Cheese that is naturally low in sodium, such as ricotta cheese, fresh mozzarella, or Swiss cheese Low-sodium or reduced-sodium cheese. Cream cheese. Yogurt. Fats and oils Unsalted butter. Unsalted margarine with no trans fat. Vegetable oils such as canola or olive oils. Seasonings and other foods Fresh and dried herbs and spices. Salt-free seasonings. Low-sodium mustard and ketchup. Sodium-free  salad dressing. Sodium-free light mayonnaise. Fresh or refrigerated horseradish. Lemon juice. Vinegar. Homemade, reduced-sodium, or low-sodium soups. Unsalted popcorn and pretzels. Low-salt or salt-free chips. What foods are not recommended? The items listed may not be a complete list. Talk with your dietitian about what dietary choices are best for you. Grains Instant hot cereals. Bread stuffing, pancake, and biscuit mixes. Croutons. Seasoned rice or pasta mixes. Noodle soup cups. Boxed or frozen macaroni and cheese. Regular salted crackers. Self-rising flour. Vegetables Sauerkraut, pickled vegetables, and relishes. Olives. [place] fries. Onion rings. Regular canned vegetables (not low-sodium or reduced-sodium). Regular canned tomato sauce and paste (not low-sodium or reduced-sodium). Regular tomato and vegetable juice (not low-sodium or reduced-sodium). Frozen vegetables in sauces. Meats and other protein foods Meat or fish that is salted, canned, smoked, spiced, or pickled. Bacon, ham, sausage, hotdogs, corned  beef, chipped beef, packaged lunch meats, salt pork, jerky, pickled herring, anchovies, regular canned tuna, sardines, salted nuts. Dairy Processed cheese and cheese spreads. Cheese curds. Blue cheese. Feta cheese. String cheese. Regular cottage cheese. Buttermilk. Canned milk. Fats and oils Salted butter. Regular margarine. Ghee. Bacon fat. Seasonings and other foods Onion salt, garlic salt, seasoned salt, table salt, and sea salt. Canned and packaged gravies. Worcestershire sauce. Tartar sauce. Barbecue sauce. Teriyaki sauce. Soy sauce, including reduced-sodium. Steak sauce. Fish sauce. Oyster sauce. Cocktail sauce. Horseradish that you find on the shelf. Regular ketchup and mustard. Meat flavorings and tenderizers. Bouillon cubes. Hot sauce and Tabasco sauce. Premade or packaged marinades. Premade or packaged taco seasonings. Relishes. Regular salad dressings. Salsa. Potato and tortilla  chips. Corn chips and puffs. Salted popcorn and pretzels. Canned or dried soups. Pizza. Frozen entrees and pot pies. Summary  Eating less sodium can help lower your blood pressure, reduce swelling, and protect your heart, liver, and kidneys.  Most people on this plan should limit their sodium intake to 1,500-2,000 mg (milligrams) of sodium each day.  Canned, boxed, and frozen foods are high in sodium. Restaurant foods, fast foods, and pizza are also very high in sodium. You also get sodium by adding salt to food.  Try to cook at home, eat more fresh fruits and vegetables, and eat less fast food, canned, processed, or prepared foods. This information is not intended to replace advice given to you by your health care provider. Make sure you discuss any questions you have with your health care provider. Document Released: 01/09/2002 Document Revised: 07/13/2016 Document Reviewed: 07/13/2016 Elsevier Interactive Patient Education  2017 [employer].    If you need a refill on your cardiac medications before your next appointment, please call your pharmacy.      Signed, Daune Perch, NP  05/06/2017 5:04 PM    [facility]

## 2017-05-13 ENCOUNTER — Other Ambulatory Visit (HOSPITAL_COMMUNITY): Payer: Medicare Other

## 2017-05-17 ENCOUNTER — Ambulatory Visit (HOSPITAL_COMMUNITY): Payer: Medicare Other | Attending: Cardiovascular Disease

## 2017-05-17 ENCOUNTER — Other Ambulatory Visit: Payer: Self-pay

## 2017-05-17 DIAGNOSIS — E669 Obesity, unspecified: Secondary | ICD-10-CM | POA: Diagnosis not present

## 2017-05-17 DIAGNOSIS — Z87891 Personal history of nicotine dependence: Secondary | ICD-10-CM | POA: Diagnosis not present

## 2017-05-17 DIAGNOSIS — Z6834 Body mass index (BMI) 34.0-34.9, adult: Secondary | ICD-10-CM | POA: Insufficient documentation

## 2017-05-17 DIAGNOSIS — I5032 Chronic diastolic (congestive) heart failure: Secondary | ICD-10-CM | POA: Diagnosis present

## 2017-05-17 DIAGNOSIS — E119 Type 2 diabetes mellitus without complications: Secondary | ICD-10-CM | POA: Insufficient documentation

## 2017-05-17 DIAGNOSIS — I2510 Atherosclerotic heart disease of native coronary artery without angina pectoris: Secondary | ICD-10-CM | POA: Insufficient documentation

## 2017-05-17 DIAGNOSIS — I110 Hypertensive heart disease with heart failure: Secondary | ICD-10-CM | POA: Insufficient documentation

## 2017-05-17 DIAGNOSIS — E785 Hyperlipidemia, unspecified: Secondary | ICD-10-CM | POA: Insufficient documentation

## 2017-05-18 ENCOUNTER — Telehealth: Payer: Self-pay | Admitting: *Deleted

## 2017-05-18 NOTE — Telephone Encounter (Signed)
Patient informed. 

## 2017-05-18 NOTE — Telephone Encounter (Signed)
-----   Message from Daune Perch, NP sent at 05/17/2017  5:25 PM EDT ----- Please inform pt that her echocardiogram showed normal pumping function. She has a moderately enlarged heart that has been known for many years. Essentially no new changes. Continue current therapy. Hopefully the prn lasix is working for her.   Daune Perch, NP

## 2017-06-30 ENCOUNTER — Other Ambulatory Visit: Payer: Self-pay | Admitting: Cardiology

## 2017-12-30 ENCOUNTER — Other Ambulatory Visit: Payer: Self-pay | Admitting: Internal Medicine

## 2017-12-30 ENCOUNTER — Ambulatory Visit
Admission: RE | Admit: 2017-12-30 | Discharge: 2017-12-30 | Disposition: A | Discharge status: Home / Self Care | Payer: Medicare Other | Source: Ambulatory Visit | Attending: Internal Medicine | Admitting: Internal Medicine

## 2017-12-30 DIAGNOSIS — R0609 Other forms of dyspnea: Principal | ICD-10-CM

## 2018-03-18 ENCOUNTER — Inpatient Hospital Stay (HOSPITAL_COMMUNITY)
Admission: EM | Admit: 2018-03-18 | Discharge: 2018-03-22 | DRG: 247 | Disposition: A | Discharge status: Home / Self Care | Payer: Medicare Other | Attending: Cardiovascular Disease | Admitting: Cardiovascular Disease

## 2018-03-18 ENCOUNTER — Other Ambulatory Visit: Payer: Self-pay

## 2018-03-18 ENCOUNTER — Encounter (HOSPITAL_COMMUNITY): Payer: Self-pay

## 2018-03-18 ENCOUNTER — Emergency Department (HOSPITAL_COMMUNITY): Payer: Medicare Other

## 2018-03-18 ENCOUNTER — Telehealth: Payer: Self-pay

## 2018-03-18 DIAGNOSIS — E113299 Type 2 diabetes mellitus with mild nonproliferative diabetic retinopathy without macular edema, unspecified eye: Secondary | ICD-10-CM | POA: Diagnosis present

## 2018-03-18 DIAGNOSIS — I10 Essential (primary) hypertension: Secondary | ICD-10-CM | POA: Diagnosis not present

## 2018-03-18 DIAGNOSIS — I440 Atrioventricular block, first degree: Secondary | ICD-10-CM | POA: Diagnosis present

## 2018-03-18 DIAGNOSIS — Z79899 Other long term (current) drug therapy: Secondary | ICD-10-CM

## 2018-03-18 DIAGNOSIS — Z955 Presence of coronary angioplasty implant and graft: Secondary | ICD-10-CM

## 2018-03-18 DIAGNOSIS — E785 Hyperlipidemia, unspecified: Secondary | ICD-10-CM | POA: Diagnosis present

## 2018-03-18 DIAGNOSIS — I214 Non-ST elevation (NSTEMI) myocardial infarction: Secondary | ICD-10-CM | POA: Diagnosis present

## 2018-03-18 DIAGNOSIS — E1122 Type 2 diabetes mellitus with diabetic chronic kidney disease: Secondary | ICD-10-CM | POA: Diagnosis present

## 2018-03-18 DIAGNOSIS — E1159 Type 2 diabetes mellitus with other circulatory complications: Secondary | ICD-10-CM | POA: Diagnosis present

## 2018-03-18 DIAGNOSIS — N183 Chronic kidney disease, stage 3 (moderate): Secondary | ICD-10-CM | POA: Diagnosis present

## 2018-03-18 DIAGNOSIS — I2510 Atherosclerotic heart disease of native coronary artery without angina pectoris: Secondary | ICD-10-CM | POA: Diagnosis present

## 2018-03-18 DIAGNOSIS — Z87891 Personal history of nicotine dependence: Secondary | ICD-10-CM

## 2018-03-18 DIAGNOSIS — I130 Hypertensive heart and chronic kidney disease with heart failure and stage 1 through stage 4 chronic kidney disease, or unspecified chronic kidney disease: Secondary | ICD-10-CM | POA: Diagnosis present

## 2018-03-18 DIAGNOSIS — Z7902 Long term (current) use of antithrombotics/antiplatelets: Secondary | ICD-10-CM

## 2018-03-18 DIAGNOSIS — Z7984 Long term (current) use of oral hypoglycemic drugs: Secondary | ICD-10-CM

## 2018-03-18 DIAGNOSIS — I25119 Atherosclerotic heart disease of native coronary artery with unspecified angina pectoris: Secondary | ICD-10-CM | POA: Diagnosis not present

## 2018-03-18 DIAGNOSIS — M40205 Unspecified kyphosis, thoracolumbar region: Secondary | ICD-10-CM | POA: Diagnosis present

## 2018-03-18 DIAGNOSIS — I5032 Chronic diastolic (congestive) heart failure: Secondary | ICD-10-CM | POA: Diagnosis present

## 2018-03-18 DIAGNOSIS — I252 Old myocardial infarction: Secondary | ICD-10-CM | POA: Diagnosis not present

## 2018-03-18 DIAGNOSIS — E1142 Type 2 diabetes mellitus with diabetic polyneuropathy: Secondary | ICD-10-CM | POA: Diagnosis present

## 2018-03-18 DIAGNOSIS — Z7982 Long term (current) use of aspirin: Secondary | ICD-10-CM | POA: Diagnosis not present

## 2018-03-18 DIAGNOSIS — K219 Gastro-esophageal reflux disease without esophagitis: Secondary | ICD-10-CM | POA: Diagnosis present

## 2018-03-18 DIAGNOSIS — E7800 Pure hypercholesterolemia, unspecified: Secondary | ICD-10-CM | POA: Diagnosis not present

## 2018-03-18 DIAGNOSIS — G8929 Other chronic pain: Secondary | ICD-10-CM | POA: Diagnosis present

## 2018-03-18 DIAGNOSIS — Z79890 Hormone replacement therapy (postmenopausal): Secondary | ICD-10-CM

## 2018-03-18 DIAGNOSIS — I4589 Other specified conduction disorders: Secondary | ICD-10-CM | POA: Diagnosis present

## 2018-03-18 DIAGNOSIS — I361 Nonrheumatic tricuspid (valve) insufficiency: Secondary | ICD-10-CM | POA: Diagnosis not present

## 2018-03-18 DIAGNOSIS — M4800 Spinal stenosis, site unspecified: Secondary | ICD-10-CM | POA: Diagnosis present

## 2018-03-18 DIAGNOSIS — Z888 Allergy status to other drugs, medicaments and biological substances status: Secondary | ICD-10-CM | POA: Diagnosis not present

## 2018-03-18 DIAGNOSIS — E1169 Type 2 diabetes mellitus with other specified complication: Secondary | ICD-10-CM | POA: Diagnosis present

## 2018-03-18 HISTORY — DX: Chronic kidney disease, stage 3 (moderate): N18.3

## 2018-03-18 HISTORY — DX: Chronic kidney disease, stage 3 unspecified: N18.30

## 2018-03-18 LAB — CBC
HCT: 41 % (ref 36.0–46.0)
Hemoglobin: 12.4 g/dL (ref 12.0–15.0)
MCH: 29.2 pg (ref 26.0–34.0)
MCHC: 30.2 g/dL (ref 30.0–36.0)
MCV: 96.7 fL (ref 78.0–100.0)
PLATELETS: 268 10*3/uL (ref 150–400)
RBC: 4.24 MIL/uL (ref 3.87–5.11)
RDW: 15.8 % — ABNORMAL HIGH (ref 11.5–15.5)
WBC: 9.3 10*3/uL (ref 4.0–10.5)

## 2018-03-18 LAB — TROPONIN I: Troponin I: 0.58 ng/mL (ref <0.03)

## 2018-03-18 LAB — BASIC METABOLIC PANEL
Anion gap: 8 (ref 5–15)
BUN: 16 mg/dL (ref 8–23)
CALCIUM: 10.8 mg/dL — AB (ref 8.9–10.3)
CO2: 22 mmol/L (ref 22–32)
CREATININE: 1.28 mg/dL — AB (ref 0.44–1.00)
Chloride: 107 mmol/L (ref 98–111)
GFR, EST AFRICAN AMERICAN: 43 mL/min — AB (ref >60)
GFR, EST NON AFRICAN AMERICAN: 37 mL/min — AB (ref >60)
Glucose, Bld: 101 mg/dL — ABNORMAL HIGH (ref 70–99)
Potassium: 5.1 mmol/L (ref 3.5–5.1)
SODIUM: 137 mmol/L (ref 135–145)

## 2018-03-18 LAB — GLUCOSE, CAPILLARY
Glucose-Capillary: 84 mg/dL (ref 70–99)
Glucose-Capillary: 106 mg/dL — ABNORMAL HIGH (ref 70–99)

## 2018-03-18 LAB — I-STAT TROPONIN, ED: TROPONIN I, POC: 0.57 ng/mL — AB (ref 0.00–0.08)

## 2018-03-18 MED ORDER — ISOSORBIDE MONONITRATE ER 30 MG PO TB24
30.0000 mg | ORAL_TABLET | Freq: Every day | ORAL | Status: DC
  Administered 2018-03-18 – 2018-03-19 (×2): 30 mg via ORAL
  Filled 2018-03-18 (×3): qty 1

## 2018-03-18 MED ORDER — ROSUVASTATIN CALCIUM 10 MG PO TABS
10.0000 mg | ORAL_TABLET | Freq: Every day | ORAL | Status: DC
  Administered 2018-03-18 – 2018-03-21 (×4): 10 mg via ORAL
  Filled 2018-03-18 (×4): qty 1

## 2018-03-18 MED ORDER — ALPRAZOLAM 0.25 MG PO TABS
0.2500 mg | ORAL_TABLET | Freq: Three times a day (TID) | ORAL | Status: DC | PRN
  Administered 2018-03-19 – 2018-03-22 (×3): 0.25 mg via ORAL
  Filled 2018-03-18 (×3): qty 1

## 2018-03-18 MED ORDER — SODIUM CHLORIDE 0.9% FLUSH
3.0000 mL | Freq: Two times a day (BID) | INTRAVENOUS | Status: DC
  Administered 2018-03-18 – 2018-03-20 (×3): 3 mL via INTRAVENOUS

## 2018-03-18 MED ORDER — CYCLOSPORINE 0.05 % OP EMUL
1.0000 [drp] | Freq: Two times a day (BID) | OPHTHALMIC | Status: DC
  Administered 2018-03-18 – 2018-03-21 (×4): 1 [drp] via OPHTHALMIC
  Filled 2018-03-18 (×8): qty 1

## 2018-03-18 MED ORDER — LEVOTHYROXINE SODIUM 75 MCG PO TABS
75.0000 ug | ORAL_TABLET | Freq: Every day | ORAL | Status: DC
  Administered 2018-03-19 – 2018-03-22 (×4): 75 ug via ORAL
  Filled 2018-03-18 (×4): qty 1

## 2018-03-18 MED ORDER — OXYCODONE-ACETAMINOPHEN 5-325 MG PO TABS
2.0000 | ORAL_TABLET | Freq: Once | ORAL | Status: AC
  Administered 2018-03-18: 2 via ORAL
  Filled 2018-03-18: qty 2

## 2018-03-18 MED ORDER — NITROGLYCERIN 0.4 MG SL SUBL
0.4000 mg | SUBLINGUAL_TABLET | SUBLINGUAL | Status: DC | PRN
  Administered 2018-03-20 (×3): 0.4 mg via SUBLINGUAL

## 2018-03-18 MED ORDER — SODIUM CHLORIDE 0.9 % WEIGHT BASED INFUSION
3.0000 mL/kg/h | INTRAVENOUS | Status: DC
  Administered 2018-03-21: 3 mL/kg/h via INTRAVENOUS

## 2018-03-18 MED ORDER — ASPIRIN 81 MG PO CHEW: 81.0000 mg | CHEWABLE_TABLET | ORAL | Status: AC

## 2018-03-18 MED ORDER — ASPIRIN 81 MG PO CHEW
81.0000 mg | CHEWABLE_TABLET | Freq: Every day | ORAL | Status: DC
  Administered 2018-03-18 – 2018-03-22 (×6): 81 mg via ORAL
  Filled 2018-03-18 (×5): qty 1

## 2018-03-18 MED ORDER — HEPARIN BOLUS VIA INFUSION
3800.0000 [IU] | Freq: Once | INTRAVENOUS | Status: AC
  Administered 2018-03-18: 3800 [IU] via INTRAVENOUS
  Filled 2018-03-18: qty 3800

## 2018-03-18 MED ORDER — ZOLPIDEM TARTRATE 5 MG PO TABS
5.0000 mg | ORAL_TABLET | Freq: Every day | ORAL | Status: DC
  Administered 2018-03-19: 5 mg via ORAL
  Filled 2018-03-18 (×3): qty 1

## 2018-03-18 MED ORDER — INSULIN ASPART 100 UNIT/ML ~~LOC~~ SOLN: 0.0000 [IU] | Freq: Three times a day (TID) | SUBCUTANEOUS | Status: DC

## 2018-03-18 MED ORDER — SODIUM CHLORIDE 0.9 % WEIGHT BASED INFUSION: 1.0000 mL/kg/h | INTRAVENOUS | Status: DC

## 2018-03-18 MED ORDER — LORAZEPAM 2 MG/ML IJ SOLN
1.0000 mg | Freq: Once | INTRAMUSCULAR | Status: AC
  Administered 2018-03-18: 1 mg via INTRAVENOUS
  Filled 2018-03-18: qty 1

## 2018-03-18 MED ORDER — SODIUM CHLORIDE 0.9% FLUSH
3.0000 mL | INTRAVENOUS | Status: DC | PRN
Start: 2018-03-18 — End: 2018-03-21

## 2018-03-18 MED ORDER — VALACYCLOVIR HCL 500 MG PO TABS
1000.0000 mg | ORAL_TABLET | Freq: Every day | ORAL | Status: DC
  Administered 2018-03-18 – 2018-03-19 (×2): 1000 mg via ORAL
  Filled 2018-03-18 (×2): qty 2

## 2018-03-18 MED ORDER — NITROGLYCERIN 2 % TD OINT
1.0000 [in_us] | TOPICAL_OINTMENT | Freq: Once | TRANSDERMAL | Status: AC
  Administered 2018-03-18: 1 [in_us] via TOPICAL
  Filled 2018-03-18: qty 1

## 2018-03-18 MED ORDER — ONDANSETRON HCL 4 MG/2ML IJ SOLN
4.0000 mg | Freq: Four times a day (QID) | INTRAMUSCULAR | Status: DC | PRN
  Administered 2018-03-20: 4 mg via INTRAVENOUS
  Filled 2018-03-18: qty 2

## 2018-03-18 MED ORDER — GABAPENTIN 300 MG PO CAPS
300.0000 mg | ORAL_CAPSULE | Freq: Two times a day (BID) | ORAL | Status: DC
  Administered 2018-03-18 – 2018-03-22 (×8): 300 mg via ORAL
  Filled 2018-03-18 (×7): qty 1

## 2018-03-18 MED ORDER — ASPIRIN 81 MG PO CHEW
324.0000 mg | CHEWABLE_TABLET | Freq: Once | ORAL | Status: AC
  Administered 2018-03-18: 324 mg via ORAL
  Filled 2018-03-18: qty 4

## 2018-03-18 MED ORDER — HEPARIN (PORCINE) IN NACL 100-0.45 UNIT/ML-% IJ SOLN
1000.0000 [IU]/h | INTRAMUSCULAR | Status: DC
  Administered 2018-03-18: 800 [IU]/h via INTRAVENOUS
  Administered 2018-03-19: 950 [IU]/h via INTRAVENOUS
  Administered 2018-03-20: 1000 [IU]/h via INTRAVENOUS
  Filled 2018-03-18 (×3): qty 250

## 2018-03-18 MED ORDER — SODIUM CHLORIDE 0.9 % IV SOLN: 250.0000 mL | INTRAVENOUS | Status: DC | PRN

## 2018-03-18 MED ORDER — OXYCODONE HCL 5 MG PO TABS
10.0000 mg | ORAL_TABLET | Freq: Four times a day (QID) | ORAL | Status: DC | PRN
  Administered 2018-03-18 – 2018-03-22 (×11): 10 mg via ORAL
  Filled 2018-03-18 (×11): qty 2

## 2018-03-18 MED ORDER — RAMIPRIL 10 MG PO CAPS
10.0000 mg | ORAL_CAPSULE | Freq: Every day | ORAL | Status: DC
  Administered 2018-03-18 – 2018-03-20 (×3): 10 mg via ORAL
  Filled 2018-03-18 (×3): qty 1

## 2018-03-18 MED ORDER — CLOPIDOGREL BISULFATE 75 MG PO TABS
75.0000 mg | ORAL_TABLET | Freq: Every day | ORAL | Status: DC
  Administered 2018-03-18 – 2018-03-22 (×5): 75 mg via ORAL
  Filled 2018-03-18 (×5): qty 1

## 2018-03-18 MED ORDER — OXYCODONE HCL 5 MG PO TABS
5.0000 mg | ORAL_TABLET | Freq: Every day | ORAL | Status: DC
  Administered 2018-03-18 – 2018-03-21 (×4): 5 mg via ORAL
  Filled 2018-03-18 (×4): qty 1

## 2018-03-18 NOTE — ED Triage Notes (Signed)
Pt reports she has had 3 angina attacks in the last 3 days. She reports resolved with 2 nitro. Last episode was yesterday. Pt denies any chest pain today. AOX4, skin warm and dry. Pt does report fatigue.

## 2018-03-18 NOTE — ED Provider Notes (Signed)
[facility] EMERGENCY DEPARTMENT Provider Note   CSN: [id number] Arrival date & time: 03/18/18  1208     History   Chief Complaint Chief Complaint  Patient presents with  . Chest Pain    HPI Kristine Oliver is a 84 y.o. female.  84 year old female who PMH including CAD, dCHF, T2DM, chronic pain, bipolar d/o who p/w chest pain.  Patient states that over the past several days she has had 3 different episodes of anginal pain which she describes as left-sided chest pain that radiates down her left arm and up into her neck.  It feels like a tightness like she is in a vice.  She had 2 separate to mild episodes that resolved after a few doses of nitroglycerin.  Yesterday she took a walk and after the walk she had a more severe episode of chest pain associated with shortness of breath and nausea.  She took 4 doses of nitroglycerin before it eased off.  Currently she states that she has a very mild discomfort in her left arm but she denies any severe symptoms.  She reports compliance with all of her medications.  No vomiting, cough/cold symptoms or recent illness.  The history is provided by the patient.  Chest Pain      Past Medical History:  Diagnosis Date  . Bipolar affective disorder ([place])   . Bradycardia   . CAD (coronary artery disease) 02/12/2012   a. NSTEMI 2006:  2.7 x 28 mm Taxus stent to LAD 2006;  b.  Cath 3/12 Dr. Marlou Porch  Normal left main, patent LAD stents with jailed septal, 30% circumflex, 30% RCA    . Chronic diastolic heart failure ([place])    a. Echo 7/13:  mod LVH, EF 65-70%, vigorous LV, Gr 1 diast dysfn, mild LAE  . Chronic pain disorder   . DDD (degenerative disc disease), lumbosacral    , Spinal stenosis  . Diabetes mellitus ([place])    , Type II  . GERD (gastroesophageal reflux disease)   . HTN (hypertension)   . Hyperlipidemia   . Hyperthyroidism   . IBS (irritable bowel syndrome)   . Lumbar disc disease   . Non Q wave myocardial infarction  ([place]) 2005  . Nonproliferative diabetic retinopathy associated with type 2 diabetes mellitus ([place])   . Obesity (BMI 30-39.9)   . Peripheral neuropathy    , Associated with DM  . Scoliosis     Patient Active Problem List   Diagnosis Date Noted  . Essential hypertension 05/19/2016  . Chest pain 05/18/2016  . Bradycardia 09/26/2014  . Sinus bradycardia 10/30/2013  . Fatigue 10/30/2013  . Acute on chronic diastolic congestive heart failure ([place]) 06/26/2013  . CAD (coronary artery disease) 02/12/2012  . Hypercalcemia 02/12/2012  . Chronic pain disorder   . Hypothyroidism   . Type 2 diabetes mellitus with vascular disease ([place])   . Hyperlipidemia   . Lumbar disc disease   . Obesity (BMI 30-39.9)   . Hypertensive heart disease without CHF   . Bipolar affective disorder ([place])   . Scoliosis     Past Surgical History:  Procedure Laterality Date  . BACK SURGERY    . CARDIAC CATHETERIZATION    . CHOLECYSTECTOMY    . DILATION AND CURETTAGE OF UTERUS    . HEMILAMINOTOMY LUMBAR SPINE  2002   Rt L5, with decompression and foraminotomy  . INDUCED ABORTION     , x2  . LAMINOTOMY Right   . LUMBAR DISC  SURGERY  07/1999   L5-S1  . PILONIDAL CYST EXCISION    . ROTATOR CUFF REPAIR    . TONSILLECTOMY    . TOTAL ABDOMINAL HYSTERECTOMY W/ BILATERAL SALPINGOOPHORECTOMY       OB History   None      Home Medications    Prior to Admission medications   Medication Sig Start Date End Date Taking? Authorizing Provider  ALPRAZolam (XANAX) 0.25 MG tablet Take 0.25 mg by mouth 3 (three) times daily as needed. anxiety    [provider]  antiseptic oral rinse (BIOTENE) LIQD 15 mLs by Mouth Rinse route as needed for dry mouth.    [provider]  aspirin 81 MG tablet Take 81 mg by mouth daily.    [provider]  clindamycin (CLEOCIN T) 1 % lotion Apply 1 application topically daily. 11/16/13   [provider]  clopidogrel (PLAVIX) 75 MG tablet Take 75 mg by  mouth daily.    [provider]  cycloSPORINE (RESTASIS) 0.05 % ophthalmic emulsion Place 1 drop into both eyes 2 (two) times daily.    [provider]  estrogen-methylTESTOSTERone 0.625-1.25 MG per tablet Take 1 tablet by mouth daily.    [provider]  furosemide (LASIX) 20 MG tablet Take 1 tablet (20 mg total) by mouth daily as needed. Do not take more than 1 tablet within a 24 hour period. 05/06/17 08/04/17  Daune Perch, NP  gabapentin (NEURONTIN) 300 MG capsule Take 300 mg by mouth 2 (two) times daily.     [provider]  isosorbide mononitrate (IMDUR) 30 MG 24 hr tablet TAKE 1/2 TABLET BY MOUTH ONCE DAILY 06/30/17   Jerline Pain, MD  levothyroxine (SYNTHROID, LEVOTHROID) 75 MCG tablet Take 75 mcg by mouth daily.    [provider]  lidocaine (LIDODERM) 5 % Place 2 patches onto the skin as needed. Remove & Discard patch within 12 hours or as directed by MD    [provider]  metFORMIN (GLUCOPHAGE) 500 MG tablet Take 1 tablet by mouth 2 (two) times daily. 03/17/16   [provider]  nitroGLYCERIN (NITROSTAT) 0.4 MG SL tablet Place 1 tablet (0.4 mg total) under the tongue every 5 (five) minutes x 3 doses as needed for chest pain. 05/02/12   Jerline Pain, MD  oxyCODONE (OXY IR/ROXICODONE) 5 MG immediate release tablet Take 5 mg by mouth at bedtime.    [provider]  Oxycodone HCl 10 MG TABS Take 1 tablet by mouth 4 (four) times daily. 05/22/15   [provider]  PREMARIN vaginal cream Place 1 Applicatorful vaginally as needed.  10/06/13   [provider]  ramipril (ALTACE) 10 MG capsule Take 1 capsule (10 mg total) by mouth daily. 05/19/16   Reyne Dumas, MD  valACYclovir (VALTREX) 500 MG tablet Take 1,000 mg by mouth daily.     [provider]  vitamin B-12 (CYANOCOBALAMIN) 1000 MCG tablet Take 1,000 mcg by mouth daily.    [provider]  zolpidem (AMBIEN) 10 MG tablet Take 10 mg by  mouth at bedtime.    [provider]    Family History Family History  Problem Relation Age of Onset  . Heart failure Father   . Diabetes Father   . Coronary artery disease Mother     Social History Social History   Tobacco Use  . Smoking status: Former Smoker    Years: 35.00  . Smokeless tobacco: Never Used  Substance Use Topics  .  Alcohol use: No    Comment: social drinking, not often  . Drug use: No     Allergies   Pravastatin and Prednisone   Review of Systems Review of Systems  Cardiovascular: Positive for chest pain.   All other systems reviewed and are negative except that which was mentioned in HPI   Physical Exam Updated Vital Signs BP (!) 157/66 (BP Location: Right Arm)   Pulse 61   Temp 98.3 F (36.8 C) (Oral)   Resp 18   Ht 4' 11.5" (1.511 m)   Wt 81.6 kg   SpO2 96%   BMI 35.75 kg/m   Physical Exam  Constitutional: She is oriented to person, place, and time. She appears well-developed and well-nourished. No distress.  HENT:  Head: Normocephalic and atraumatic.  Moist mucous membranes  Eyes: Pupils are equal, round, and reactive to light. Conjunctivae are normal.  Neck: Neck supple.  Cardiovascular: Normal rate and regular rhythm.  Murmur heard. Pulmonary/Chest: Effort normal and breath sounds normal.  Abdominal: Soft. Bowel sounds are normal. She exhibits no distension. There is no tenderness.  Musculoskeletal: She exhibits no edema.  Neurological: She is alert and oriented to person, place, and time.  Fluent speech  Skin: Skin is warm and dry.  Psychiatric: She has a normal mood and affect. Judgment normal.  Nursing note and vitals reviewed.    ED Treatments / Results  Labs (all labs ordered are listed, but only abnormal results are displayed) Labs Reviewed  BASIC METABOLIC PANEL - Abnormal; Notable for the following components:      Result Value   Glucose, Bld 101 (*)    Creatinine, Ser 1.28 (*)    Calcium 10.8 (*)      GFR calc non Af Amer 37 (*)    GFR calc Af Amer 43 (*)    All other components within normal limits  CBC - Abnormal; Notable for the following components:   RDW 15.8 (*)    All other components within normal limits  I-STAT TROPONIN, ED - Abnormal; Notable for the following components:   Troponin i, poc 0.57 (*)    All other components within normal limits    EKG EKG Interpretation  Date/Time:  Friday March 18 2018 12:14:51 EDT Ventricular Rate:  57 PR Interval:  268 QRS Duration: 76 QT Interval:  374 QTC Calculation: 364 R Axis:     Text Interpretation:  Sinus bradycardia with marked sinus arrhythmia with 1st degree A-V block Minimal voltage criteria for LVH, may be normal variant Cannot rule out Anterior infarct , age undetermined Abnormal ECG No significant change since last tracing Confirmed by Theotis Burrow [phone]) on 03/18/2018 1:00:32 PM   Radiology Dg Chest 2 View  Result Date: 03/18/2018 CLINICAL DATA:  Chest pain EXAM: CHEST - 2 VIEW COMPARISON:  12/30/2017 FINDINGS: Borderline heart size. Long-standing right mediastinal convexity likely from the ascending aorta tortuosity. There is generalized interstitial coarsening that is chronic based on priors. No acute opacity. Spondylosis and exaggerated thoracolumbar kyphosis. IMPRESSION: 1. No acute finding. 2. Stable interstitial coarsening, suspect interstitial lung disease. Consider chest CT characterization. 3. Enlarged heart Electronically Signed   By: Monte Fantasia M.D.   On: 03/18/2018 12:54    Procedures .Critical Care Performed by: Sharlett Iles, MD Authorized by: Sharlett Iles, MD   Critical care provider statement:    Critical care time (minutes):  30   Critical care time was exclusive of:  Separately billable procedures and treating other patients  Critical care was necessary to treat or prevent imminent or life-threatening deterioration of the following conditions:  Cardiac failure    Critical care was time spent personally by me on the following activities:  Development of treatment plan with patient or surrogate, discussions with consultants, examination of patient, ordering and performing treatments and interventions, ordering and review of laboratory studies, ordering and review of radiographic studies, obtaining history from patient or surrogate and review of old charts   (including critical care time)  Medications Ordered in ED Medications  nitroGLYCERIN (NITROGLYN) 2 % ointment 1 inch (has no administration in time range)  aspirin chewable tablet 324 mg (324 mg Oral Given 03/18/18 1322)  oxyCODONE-acetaminophen (PERCOCET/ROXICET) 5-325 MG per tablet 2 tablet (2 tablets Oral Given 03/18/18 1322)     Initial Impression / Assessment and Plan / ED Course  I have reviewed the triage vital signs and the nursing notes.  Pertinent labs & imaging results that were available during my care of the patient were reviewed by me and considered in my medical decision making (see chart for details).     PT comfortable on exam, VS stable, EKG without ST elevation. Gave ASA and NTG paste for mild discomfort. Labs show trop 0.57, cr 1.28. Contacted cardiology due to unstable angina and concern for NSTEMI. Pt admitted to cardiology service for further w/u and treatment.  Final Clinical Impressions(s) / ED Diagnoses   Final diagnoses:  None    ED Discharge Orders    None       Ashwath Lasch, Wenda Overland, MD 03/18/18 1622

## 2018-03-18 NOTE — H&P (Addendum)
History & Physical    Patient ID: Kristine Oliver MRN: [id number], DOB/AGE: Jan 21, 1931   Admit date: 03/18/2018   Primary Physician: Kristine Orn, MD Primary Cardiologist: Kristine Furbish, MD  Patient Profile    84 y/o ? with a h/o CAD s/p NSTEMI in 2005 w/ LAD DES, HTN, HL, DM II, HFpEF, and bradycardia w/ chronotropic incompetence (refused PPM), who presented to the ED today with a 10 day h/o intermittent chest pain and has been found to have a troponin of 0.57.  Past Medical History    Past Medical History:  Diagnosis Date  . Bipolar affective disorder (Kristine Oliver)   . Bradycardia   . CAD (coronary artery disease)    a. 08/2004 NSTEMI/PCI: LAD 37m/d (2.75 x 28 mm Taxus 2 DES);  b. 10/2010 Cath: LM nl, LAD patent stents w/ jailed septal, LCX 30, RCA 30; c. 06/2013 MV: No ischemia. Small fixed anteroseptal defect, ? scar vs atten. EF 62%; d. 03/2016 MV: EF 57%, Low risk.  . Chronic diastolic heart failure (Kristine Oliver)    a. Echo 7/13:  mod LVH, EF 65-70%, vigorous LV, Gr 1 diast dysfn, mild LAE; b. 05/2017 Echo: EF 60-65%, mod LVH, no rwma, Gr1 DD, mildly dil LA.  Marland Kitchen Chronic pain disorder   . CKD (chronic kidney disease), stage III (Kristine Oliver)   . DDD (degenerative disc disease), lumbosacral    , Spinal stenosis  . Diabetes mellitus ([place])    , Type II  . GERD (gastroesophageal reflux disease)   . HTN (hypertension)   . Hyperlipidemia   . Hyperthyroidism   . IBS (irritable bowel syndrome)   . Lumbar disc disease   . Nonproliferative diabetic retinopathy associated with type 2 diabetes mellitus (Kristine Oliver)   . Obesity (BMI 30-39.9)   . Peripheral neuropathy    , Associated with DM  . Scoliosis     Past Surgical History:  Procedure Laterality Date  . BACK SURGERY    . CARDIAC CATHETERIZATION    . CHOLECYSTECTOMY    . DILATION AND CURETTAGE OF UTERUS    . HEMILAMINOTOMY LUMBAR SPINE  2002   Rt L5, with decompression and foraminotomy  . INDUCED ABORTION     , x2  . LAMINOTOMY Right   . LUMBAR DISC  SURGERY  07/1999   L5-S1  . PILONIDAL CYST EXCISION    . ROTATOR CUFF REPAIR    . TONSILLECTOMY    . TOTAL ABDOMINAL HYSTERECTOMY W/ BILATERAL SALPINGOOPHORECTOMY       Allergies  Allergies  Allergen Reactions  . Pravastatin Other (See Comments)    Muscle aches  . Prednisone Nausea And Vomiting    History of Present Illness    84 y/o ? with a h/o CAD s/p NSTEMI in 2005 w/ LAD DES, THN, HL, DM II, HFpEF, and bradycardia w/ chronotropic incompetence.  As noted, she suffered a non-ST segment elevation myocardial infarction in 2005 and underwent diagnostic catheterization revealing severe LAD disease which was successfully treated with a Taxus drug-eluting stent.  She subsequently underwent repeat catheterization March 2012 which showed patent LAD stent with a jailed septal perforator.  She otherwise had nonobstructive disease and was medically managed.  She has since undergone stress testing x2 with a nonischemic Myoview in November 2014, and a low risk Myoview in August 2017.  Other history includes bradycardia with first-degree AV block and chronotropic incompetence for which she was previously seen by elective physiology with recommendation for permanent pacemaker however she has declined.  She has  not had any issues with presyncope or syncope.  She also has a history of hypertension, hyperlipidemia, GERD, diabetes, and HFpEF.  Last echo was performed in October 2018 and showed normal LV function with moderate LVH.  She lives with her nephew here in [place].  She walks most days the week for about 45 minutes.  She says she walks very slowly and typically covers about a half a mile and that time.  She has been walking some this week and has not had any chest pain with activity.  [name] was in her usual state of health until approximately 10 days ago.  Over the past 10 days, she has had 3 distinct episodes of left arm and left-sided chest pain that radiates across her chest, down both arms,  and into her neck and jaw.  All episodes occurred at rest.  The first 2 episodes resolved after taking 2 sublingual nitroglycerin tablets and lasted under an hour.  On August 15 however, she had an episode that occurred in the late afternoon and lasted several hours.  She took a total of 4 sublingual nitroglycerin tablets.  Symptoms eventually resolved by early evening.  She had an uneventful night and this morning presented to our office as a walk-in.  Upon reporting symptoms, she was referred to the emergency department for further evaluation.  She has not had any recurrent chest pain today.  In the ED, twelve-lead is nonacute however troponin is elevated at 0.57.  She is currently hemodynamically stable.  Home Medications    Prior to Admission medications   Medication Sig Start Date End Date Taking? Authorizing Provider  ALPRAZolam (XANAX) 0.25 MG tablet Take 0.25 mg by mouth 3 (three) times daily as needed. anxiety    [provider]  antiseptic oral rinse (BIOTENE) LIQD 15 mLs by Mouth Rinse route as needed for dry mouth.    [provider]  aspirin 81 MG tablet Take 81 mg by mouth daily.    [provider]  clindamycin (CLEOCIN T) 1 % lotion Apply 1 application topically daily. 11/16/13   [provider]  clopidogrel (PLAVIX) 75 MG tablet Take 75 mg by mouth daily.    [provider]  cycloSPORINE (RESTASIS) 0.05 % ophthalmic emulsion Place 1 drop into both eyes 2 (two) times daily.    [provider]  estrogen-methylTESTOSTERone 0.625-1.25 MG per tablet Take 1 tablet by mouth daily.    [provider]  furosemide (LASIX) 20 MG tablet Take 1 tablet (20 mg total) by mouth daily as needed. Do not take more than 1 tablet within a 24 hour period. 05/06/17 08/04/17  Daune Perch, NP  gabapentin (NEURONTIN) 300 MG capsule Take 300 mg by mouth 2 (two) times daily.     [provider]  isosorbide mononitrate (IMDUR) 30 MG 24 hr  tablet TAKE 1/2 TABLET BY MOUTH ONCE DAILY 06/30/17   Jerline Pain, MD  levothyroxine (SYNTHROID, LEVOTHROID) 75 MCG tablet Take 75 mcg by mouth daily.    [provider]  lidocaine (LIDODERM) 5 % Place 2 patches onto the skin as needed. Remove & Discard patch within 12 hours or as directed by MD    [provider]  metFORMIN (GLUCOPHAGE) 500 MG tablet Take 1 tablet by mouth 2 (two) times daily. 03/17/16   [provider]  nitroGLYCERIN (NITROSTAT) 0.4 MG SL tablet Place 1 tablet (0.4 mg total) under the tongue every 5 (five) minutes x 3 doses as needed for chest  pain. 05/02/12   Jerline Pain, MD  oxyCODONE (OXY IR/ROXICODONE) 5 MG immediate release tablet Take 5 mg by mouth at bedtime.    [provider]  Oxycodone HCl 10 MG TABS Take 1 tablet by mouth 4 (four) times daily. 05/22/15   [provider]  PREMARIN vaginal cream Place 1 Applicatorful vaginally as needed.  10/06/13   [provider]  ramipril (ALTACE) 10 MG capsule Take 1 capsule (10 mg total) by mouth daily. 05/19/16   Reyne Dumas, MD  valACYclovir (VALTREX) 500 MG tablet Take 1,000 mg by mouth daily.     [provider]  vitamin B-12 (CYANOCOBALAMIN) 1000 MCG tablet Take 1,000 mcg by mouth daily.    [provider]  zolpidem (AMBIEN) 10 MG tablet Take 10 mg by mouth at bedtime.    [provider]    Family History    Family History  Problem Relation Age of Onset  . Heart failure Father   . Diabetes Father   . Coronary artery disease Mother    She indicated that her mother is deceased. She indicated that her father is deceased. She indicated that her sister is alive. She indicated that her maternal grandmother is deceased. She indicated that her maternal grandfather is deceased. She indicated that her paternal grandmother is deceased. She indicated that her paternal grandfather is deceased.   Social History    Social History   Socioeconomic  History  . Marital status: Divorced    Spouse name: Not on file  . Number of children: Not on file  . Years of education: Not on file  . Highest education level: Not on file  Occupational History  . Occupation: retired  Scientific laboratory technician  . Financial resource strain: Not on file  . Food insecurity:    Worry: Not on file    Inability: Not on file  . Transportation needs:    Medical: Not on file    Non-medical: Not on file  Tobacco Use  . Smoking status: Former Smoker    Years: 35.00  . Smokeless tobacco: Never Used  Substance and Sexual Activity  . Alcohol use: No    Comment: social drinking, not often  . Drug use: No  . Sexual activity: Not on file  Lifestyle  . Physical activity:    Days per week: Not on file    Minutes per session: Not on file  . Stress: Not on file  Relationships  . Social connections:    Talks on phone: Not on file    Gets together: Not on file    Attends religious service: Not on file    Active member of club or organization: Not on file    Attends meetings of clubs or organizations: Not on file    Relationship status: Not on file  . Intimate partner violence:    Fear of current or ex partner: Not on file    Emotionally abused: Not on file    Physically abused: Not on file    Forced sexual activity: Not on file  Other Topics Concern  . Not on file  Social History Narrative   Divorced.  Lives with her nephew in [place].  She walks most days of the week.     Review of Systems    General:  No chills, fever, night sweats or weight changes.  Cardiovascular:  +++ chest pain, +++ occasional dyspnea on exertion, no edema, orthopnea, palpitations, paroxysmal nocturnal dyspnea. Dermatological: No rash, lesions/masses Respiratory:  No cough, +++ occasional dyspnea Urologic: No hematuria, dysuria Abdominal:   No nausea, vomiting, diarrhea, bright red blood per rectum, melena, or hematemesis Neurologic:  No visual changes, wkns, changes in mental  status. All other systems reviewed and are otherwise negative except as noted above.  Physical Exam    Blood pressure (!) 168/58, pulse 60, temperature 98.3 F (36.8 C), temperature source Oral, resp. rate 16, height 4' 11.5" (1.511 m), weight 81.6 kg, SpO2 96 %.  General: Pleasant, NAD Psych: Normal affect. Neuro: Alert and oriented X 3. Moves all extremities spontaneously. HEENT: Normal  Neck: Supple without bruits or JVD. Lungs:  Resp regular and unlabored, CTA. Heart: RRR no s3, s4, or murmurs. Abdomen: Soft, non-tender, non-distended, BS + x 4.  Extremities: No clubbing, cyanosis or edema. DP/PT/Radials 2+ and equal bilaterally.  Labs     Recent Labs    03/18/18 1225  TROPIPOC 0.57*   Lab Results  Component Value Date   WBC 9.3 03/18/2018   HGB 12.4 03/18/2018   HCT 41.0 03/18/2018   MCV 96.7 03/18/2018   PLT 268 03/18/2018    Recent Labs  Lab 03/18/18 1221  NA 137  K 5.1  CL 107  CO2 22  BUN 16  CREATININE 1.28*  CALCIUM 10.8*  GLUCOSE 101*   Lab Results  Component Value Date   CHOL 162 05/19/2016   HDL 31 (L) 05/19/2016   LDLCALC 103 (H) 05/19/2016   TRIG 138 05/19/2016    Radiology Studies    Dg Chest 2 View  Result Date: 03/18/2018 CLINICAL DATA:  Chest pain EXAM: CHEST - 2 VIEW COMPARISON:  12/30/2017 FINDINGS: Borderline heart size. Long-standing right mediastinal convexity likely from the ascending aorta tortuosity. There is generalized interstitial coarsening that is chronic based on priors. No acute opacity. Spondylosis and exaggerated thoracolumbar kyphosis. IMPRESSION: 1. No acute finding. 2. Stable interstitial coarsening, suspect interstitial lung disease. Consider chest CT characterization. 3. Enlarged heart Electronically Signed   By: Monte Fantasia M.D.   On: 03/18/2018 12:54    ECG & Cardiac Imaging    Sinus brady, 57, 1st deg avb, leftward axis, no acute st/t changes.  Assessment & Plan    1.  Non-STEMI/coronary artery disease:  Patient with prior history of CAD status post LAD stenting in 2006 with patent stent and jailed septal perforator on catheterization in 2012.  She had a nonischemic Myoview's in 2014 and again in 2017.  Echocardiogram last year showed normal LV function without wall motion abnormalities.  She walks regularly without significant symptoms or limitations and over the past 10 days, has had 3 episodes of prolonged chest discomfort at rest for which she has been taking nitroglycerin.  She had an episode yesterday that lasted several hours prior to resolution.  She took a total of 4 nitroglycerin yesterday.  She presented to our office this morning and was referred to the ED for evaluation.  Here, ECG is nonacute however troponin is elevated 0.57.  She is currently chest pain-free in fact has not had any pain since yesterday.  Plan to admit and continue to cycle cardiac enzymes.  Heparinize and continue aspirin, Plavix, nitrate.  Does not appear that she is on a statin in the setting of prior intolerance.  No beta-blocker in the setting of baseline bradycardia with chronotropic incompetence.  Gently hydrate this week and with plan for diagnostic catheterization on Monday or sooner if she has recurrent symptoms.  2.  Bradycardia/chronotropic incompetence: Previously advised that she  would require permanent pacemaker.  She refused.  She has not been having any presyncope or syncope.  Rhythm stable today.  Avoid AV nodal blocking agent.  3.  Hyperlipidemia: LDL was 103 in October 2017.  Not currently on a statin in the setting of prior intolerance to pravastatin.  In the setting of acute coronary syndrome, I will add low-dose Crestor.  4.  Essential hypertension: Blood pressure elevated in the emergency department.  Continue nitrate and ACE inhibitor and adjust as necessary.  5.  Type 2 diabetes mellitus: Hold metformin.  + Scale insulin.  6.  Stage III chronic kidney disease: Creatinine 1.28, which is about her  baseline.  Follow.  Signed, Murray Hodgkins, NP 03/18/2018, 2:29 PM   I have seen and examined the patient along with Murray Hodgkins, NP.  I have reviewed the chart, notes and new data.  I agree with PA/NP's note.  Key new complaints: Currently chest pain-free.  Had prolonged episode of anginal chest pain at rest 2 nights ago.  Denies dyspnea or other cardiac complaints. Key examination changes: Normal cardiovascular exam Key new findings / data: Low risk ECG, abnormal cardiac troponin I.  PLAN: She will need coronary angiography and percutaneous revascularization with angioplasty/stent if appropriate.  From a logistical standpoint this will be performed on Monday, she develops symptoms and evidence of ST segment changes while in the hospital.  Sanda Klein, MD, [place] [phone] 03/18/2018, 3:23 PM

## 2018-03-18 NOTE — Progress Notes (Signed)
ANTICOAGULATION CONSULT NOTE - Initial Consult  Pharmacy Consult for heparin Indication: chest pain/ACS  Allergies  Allergen Reactions  . Pravastatin Other (See Comments)    Muscle aches  . Prednisone Nausea And Vomiting    Patient Measurements: Height: 4' 11.5" (151.1 cm) Weight: 180 lb (81.6 kg) IBW/kg (Calculated) : 44.35 Heparin Dosing Weight: 63.3kg  Vital Signs: Temp: 98.3 F (36.8 C) (08/16 1221) Temp Source: Oral (08/16 1221) BP: 179/80 (08/16 1446) Pulse Rate: 63 (08/16 1446)  Labs: Recent Labs    03/18/18 1221  HGB 12.4  HCT 41.0  PLT 268  CREATININE 1.28*    Estimated Creatinine Clearance: 30.1 mL/min (A) (by C-G formula based on SCr of 1.28 mg/dL (H)).   Medical History: Past Medical History:  Diagnosis Date  . Bipolar affective disorder ([place])   . Bradycardia   . CAD (coronary artery disease)    a. 08/2004 NSTEMI/PCI: LAD 52m/d (2.75 x 28 mm Taxus 2 DES);  b. 10/2010 Cath: LM nl, LAD patent stents w/ jailed septal, LCX 30, RCA 30; c. 06/2013 MV: No ischemia. Small fixed anteroseptal defect, ? scar vs atten. EF 62%; d. 03/2016 MV: EF 57%, Low risk.  . Chronic diastolic heart failure ([place])    a. Echo 7/13:  mod LVH, EF 65-70%, vigorous LV, Gr 1 diast dysfn, mild LAE; b. 05/2017 Echo: EF 60-65%, mod LVH, no rwma, Gr1 DD, mildly dil LA.  Marland Kitchen Chronic pain disorder   . CKD (chronic kidney disease), stage III ([place])   . DDD (degenerative disc disease), lumbosacral    , Spinal stenosis  . Diabetes mellitus ([place])    , Type II  . GERD (gastroesophageal reflux disease)   . HTN (hypertension)   . Hyperlipidemia   . Hyperthyroidism   . IBS (irritable bowel syndrome)   . Lumbar disc disease   . Nonproliferative diabetic retinopathy associated with type 2 diabetes mellitus ([place])   . Obesity (BMI 30-39.9)   . Peripheral neuropathy    , Associated with DM  . Scoliosis     Medications:  Infusions:  . heparin      Assessment: 29 yof presented to the ED with  CP. Troponin elevated and now starting IV heparin. Baseline CBC is WNL and she is not on anticoagulation PTA.   Goal of Therapy:  Heparin level 0.3-0.7 units/ml Monitor platelets by anticoagulation protocol: Yes   Plan:  Heparin bolus 3800 units IV x 1 Heparin gtt 800 units/hr Check an 8 hr heparin level Daily heparin level and CBC  Jacksen Isip, Rande Lawman 03/18/2018,2:53 PM

## 2018-03-18 NOTE — Telephone Encounter (Signed)
Incoming call from Hadassah Pais PA in regards to Pt.  Per APP- Pt with h/o stent placed 2013 Pt of Dr. Marlou Porch.  Pt presents with chest pain x 10 days.  Pt took 4 nitro tabs yesterday.  She has been taking 1-2 nitro tabs daily over last 10 days.  Pt took extra furosemide thinking it may help. Chest pain is rated at 2/10 today, but Pt reports has been painful as 5/10 over the last 10 days.   BP today 140/100.   EKG - SR with 1st degree AV block.  APP discussed with DOD.  DOD advised Pt go to hospital. APP will advise Pt.  No further action at this time.

## 2018-03-19 ENCOUNTER — Inpatient Hospital Stay (HOSPITAL_COMMUNITY): Payer: Medicare Other

## 2018-03-19 DIAGNOSIS — I361 Nonrheumatic tricuspid (valve) insufficiency: Secondary | ICD-10-CM

## 2018-03-19 LAB — BASIC METABOLIC PANEL
Anion gap: 8 (ref 5–15)
BUN: 16 mg/dL (ref 8–23)
CO2: 22 mmol/L (ref 22–32)
Calcium: 10.4 mg/dL — ABNORMAL HIGH (ref 8.9–10.3)
Chloride: 109 mmol/L (ref 98–111)
Creatinine, Ser: 1.47 mg/dL — ABNORMAL HIGH (ref 0.44–1.00)
GFR calc Af Amer: 36 mL/min — ABNORMAL LOW (ref >60)
GFR calc non Af Amer: 31 mL/min — ABNORMAL LOW (ref >60)
Glucose, Bld: 89 mg/dL (ref 70–99)
Potassium: 4.9 mmol/L (ref 3.5–5.1)
Sodium: 139 mmol/L (ref 135–145)

## 2018-03-19 LAB — CBC
HCT: 34.9 % — ABNORMAL LOW (ref 36.0–46.0)
HEMOGLOBIN: 11 g/dL — AB (ref 12.0–15.0)
MCH: 29.6 pg (ref 26.0–34.0)
MCHC: 31.5 g/dL (ref 30.0–36.0)
MCV: 94.1 fL (ref 78.0–100.0)
Platelets: 212 10*3/uL (ref 150–400)
RBC: 3.71 MIL/uL — ABNORMAL LOW (ref 3.87–5.11)
RDW: 15.4 % (ref 11.5–15.5)
WBC: 6.8 10*3/uL (ref 4.0–10.5)

## 2018-03-19 LAB — TROPONIN I
Troponin I: 0.34 ng/mL (ref <0.03)
Troponin I: 0.34 ng/mL (ref <0.03)

## 2018-03-19 LAB — LIPID PANEL
Cholesterol: 142 mg/dL (ref 0–200)
HDL: 30 mg/dL — ABNORMAL LOW (ref >40)
LDL Cholesterol: 91 mg/dL (ref 0–99)
Total CHOL/HDL Ratio: 4.7 RATIO
Triglycerides: 103 mg/dL (ref <150)
VLDL: 21 mg/dL (ref 0–40)

## 2018-03-19 LAB — HEPARIN LEVEL (UNFRACTIONATED)
Heparin Unfractionated: 0.17 IU/mL — ABNORMAL LOW (ref 0.30–0.70)
Heparin Unfractionated: 0.3 IU/mL (ref 0.30–0.70)
Heparin Unfractionated: 0.45 IU/mL (ref 0.30–0.70)

## 2018-03-19 LAB — ECHOCARDIOGRAM COMPLETE
HEIGHTINCHES: 59.5 in
WEIGHTICAEL: 2801.6 [oz_av]

## 2018-03-19 LAB — GLUCOSE, CAPILLARY
Glucose-Capillary: 90 mg/dL (ref 70–99)
Glucose-Capillary: 95 mg/dL (ref 70–99)

## 2018-03-19 MED ORDER — HEPARIN BOLUS VIA INFUSION
1500.0000 [IU] | Freq: Once | INTRAVENOUS | Status: AC
  Administered 2018-03-19: 1500 [IU] via INTRAVENOUS
  Filled 2018-03-19: qty 1500

## 2018-03-19 NOTE — Progress Notes (Signed)
  Echocardiogram 2D Echocardiogram has been performed.  Kristine Oliver 03/19/2018, 8:59 AM

## 2018-03-19 NOTE — Progress Notes (Signed)
[place] for heparin Indication: chest pain/ACS  Allergies  Allergen Reactions  . Pravastatin Other (See Comments)    Muscle aches  . Prednisone Nausea And Vomiting    Patient Measurements: Height: 4' 11.5" (151.1 cm) Weight: 180 lb (81.6 kg) IBW/kg (Calculated) : 44.35 Heparin Dosing Weight: 63.3kg  Vital Signs: Temp: 97.6 F (36.4 C) (08/17 0006) Temp Source: Oral (08/17 0006) BP: 123/49 (08/17 0006) Pulse Rate: 52 (08/17 0006)  Labs: Recent Labs    03/18/18 1221 03/18/18 1645 03/18/18 2358  HGB 12.4  --   --   HCT 41.0  --   --   PLT 268  --   --   HEPARINUNFRC  --   --  0.17*  CREATININE 1.28*  --   --   TROPONINI  --  0.58*  --     Estimated Creatinine Clearance: 30.1 mL/min (A) (by C-G formula based on SCr of 1.28 mg/dL (H)).  Assessment: 84 y.o. female with chest pain for heparin   Goal of Therapy:  Heparin level 0.3-0.7 units/ml Monitor platelets by anticoagulation protocol: Yes   Plan:  Heparin 1500 units IV bolus, then increase heparin 950 units/hr Check heparin level in 8 hours.   Loi Rennaker, Bronson Curb 03/19/2018,12:53 AM

## 2018-03-19 NOTE — Progress Notes (Signed)
Progress Note  Patient Name: Kristine Oliver Date of Encounter: 03/19/2018  Primary Cardiologist: [name], MD   Subjective   Patient very distraught this morning. She states that she needs a firm mattress with a soft top, and she can't sleep on the hospital bed. Tried to redirect and offer solutions, but she just became more upset. She is able to tell me that she is not having chest pain, but her back is bothering her a great deal.  Inpatient Medications    Scheduled Meds: . aspirin  81 mg Oral Daily  . aspirin  81 mg Oral Pre-Cath  . clopidogrel  75 mg Oral Daily  . cycloSPORINE  1 drop Both Eyes BID  . gabapentin  300 mg Oral BID  . insulin aspart  0-15 Units Subcutaneous TID WC  . isosorbide mononitrate  30 mg Oral Daily  . levothyroxine  75 mcg Oral QAC breakfast  . oxyCODONE  5 mg Oral QHS  . ramipril  10 mg Oral Daily  . rosuvastatin  10 mg Oral q1800  . sodium chloride flush  3 mL Intravenous Q12H  . zolpidem  5 mg Oral QHS   Continuous Infusions: . sodium chloride    . [START ON 03/21/2018] sodium chloride     Followed by  . [START ON 03/21/2018] sodium chloride    . heparin 950 Units/hr (03/19/18 1303)   PRN Meds: sodium chloride, ALPRAZolam, nitroGLYCERIN, ondansetron (ZOFRAN) IV, oxyCODONE, sodium chloride flush   Vital Signs    Vitals:   03/19/18 0934 03/19/18 1152 03/19/18 1300 03/19/18 1346  BP: (!) 197/60 (!) 165/74  (!) 140/55  Pulse: (!) 53 (!) 58  (!) 51  Resp:   18   Temp: 97.9 F (36.6 C) 98.2 F (36.8 C)  98 F (36.7 C)  TempSrc: Oral Oral  Oral  SpO2: 95% 97%  95%  Weight:      Height:        Intake/Output Summary (Last 24 hours) at 03/19/2018 1350 Last data filed at 03/19/2018 1303 Gross per 24 hour  Intake 825.8 ml  Output 1200 ml  Net -374.2 ml   Filed Weights   03/18/18 1221 03/19/18 0327  Weight: 81.6 kg 79.4 kg    Telemetry    Sinus bradycardia, 1st degree AV block - Personally Reviewed  ECG    Sinus bradycardia,  1st degree AV block - Personally Reviewed  Physical Exam   GEN: Agitated, upset Neck: supple, no apparent Cardiac: regular S1 and S2, no murmurs, rubs, or gallops.  Respiratory: difficult to hear as she continued to be upset throughout interview, but sounded clear. GI: Soft, nontender, non-distended. Bowel sounds normal MS: No edema; No deformity. Neuro:  Nonfocal, moves all limbs independently Psych: Normal affect   Labs    Chemistry Recent Labs  Lab 03/18/18 1221 03/19/18 0423  NA 137 139  K 5.1 4.9  CL 107 109  CO2 22 22  GLUCOSE 101* 89  BUN 16 16  CREATININE 1.28* 1.47*  CALCIUM 10.8* 10.4*  GFRNONAA 37* 31*  GFRAA 43* 36*  ANIONGAP 8 8     Hematology Recent Labs  Lab 03/18/18 1221 03/19/18 0423  WBC 9.3 6.8  RBC 4.24 3.71*  HGB 12.4 11.0*  HCT 41.0 34.9*  MCV 96.7 94.1  MCH 29.2 29.6  MCHC 30.2 31.5  RDW 15.8* 15.4  PLT 268 212    Cardiac Enzymes Recent Labs  Lab 03/18/18 1645 03/18/18 2358 03/19/18 0423  TROPONINI  0.58* 0.34* 0.34*    Recent Labs  Lab 03/18/18 1225  TROPIPOC 0.57*     BNPNo results for input(s): BNP, PROBNP in the last 168 hours.   DDimer No results for input(s): DDIMER in the last 168 hours.   Radiology    Dg Chest 2 View  Result Date: 03/18/2018 CLINICAL DATA:  Chest pain EXAM: CHEST - 2 VIEW COMPARISON:  12/30/2017 FINDINGS: Borderline heart size. Long-standing right mediastinal convexity likely from the ascending aorta tortuosity. There is generalized interstitial coarsening that is chronic based on priors. No acute opacity. Spondylosis and exaggerated thoracolumbar kyphosis. IMPRESSION: 1. No acute finding. 2. Stable interstitial coarsening, suspect interstitial lung disease. Consider chest CT characterization. 3. Enlarged heart Electronically Signed   By: Monte Fantasia M.D.   On: 03/18/2018 12:54    Cardiac Studies   Echo 03/19/18 Left ventricle: The cavity size was normal. There was mild   concentric  hypertrophy. Systolic function was normal. The   estimated ejection fraction was in the range of 60% to 65%. Wall   motion was normal; there were no regional wall motion   abnormalities. Doppler parameters are consistent with abnormal   left ventricular relaxation (grade 1 diastolic dysfunction).   Doppler parameters are consistent with elevated ventricular   end-diastolic filling pressure. - Aortic valve: There was trivial regurgitation. - Mitral valve: There was mild regurgitation. - Left atrium: The atrium was mildly dilated. - Right ventricle: Systolic function was normal. - Tricuspid valve: There was mild regurgitation. - Pulmonic valve: There was no regurgitation. - Pulmonary arteries: Systolic pressure was within the normal   range. - Inferior vena cava: The vessel was normal in size. - Pericardium, extracardiac: There was no pericardial effusion.  Patient Profile     84 y.o. female with a history of CAD (NSTEMI 2005) s/p DES to LAD, HTN, HLD, type II diabetes, HFpEF, bradycardia (declined pacemaker) who presented with 10 days of chest pain and had an elevated troponin, consistent with NSTEMI.  Assessment & Plan    NSTEMI: troponin has now leveled at 0.34. -continue heparin, aspirin, clopidogrel -continue imdur -has been intolerate of statins in the past, attempting low dose rosuvastatin. LDL 91. -no beta blocker given her baseline bradycardia with chronotropic incompetence -hypertension management: very upset today, had elevated blood pressures when agitated. Improved later. Continue to monitor. Continue ramipril for now, if Cr rises will need to hold and treat BP with another agent  Diabetes: holding metformin, continue SSI CKD stage III: monitor given plans for cath. Cr 1.47.  Plan is for cath on Monday.     Time Spent Directly with Patient: I have spent a total of 25 minutes with the patient reviewing hospital notes, telemetry, EKGs, labs and examining the patient as  well as establishing an assessment and plan that was discussed personally with the patient.  > 50% of time was spent in direct patient care.  Length of Stay:  LOS: 1 day   Buford Dresser, MD, PhD [facility]  [facility]   03/19/2018, 1:50 PM   For questions or updates, please contact [place] Please consult [email or web address] for contact info under Cardiology/STEMI.

## 2018-03-19 NOTE — Progress Notes (Addendum)
[place] for heparin Indication: chest pain/ACS  Allergies  Allergen Reactions  . Pravastatin Other (See Comments)    Muscle aches  . Prednisone Nausea And Vomiting    Patient Measurements: Height: 4' 11.5" (151.1 cm) Weight: 175 lb 1.6 oz (79.4 kg) IBW/kg (Calculated) : 44.35 Heparin Dosing Weight: 63.3kg  Vital Signs: Temp: 97.9 F (36.6 C) (08/17 0934) Temp Source: Oral (08/17 0934) BP: 197/60 (08/17 0934) Pulse Rate: 53 (08/17 0934)  Labs: Recent Labs    03/18/18 1221 03/18/18 1645 03/18/18 2358 03/19/18 0423 03/19/18 0755  HGB 12.4  --   --  11.0*  --   HCT 41.0  --   --  34.9*  --   PLT 268  --   --  212  --   HEPARINUNFRC  --   --  0.17*  --  0.45  CREATININE 1.28*  --   --  1.47*  --   TROPONINI  --  0.58* 0.34* 0.34*  --     Estimated Creatinine Clearance: 25.8 mL/min (A) (by C-G formula based on SCr of 1.47 mg/dL (H)).  Assessment: 84 y.o. female presented to the ED with CP. Troponin increased 0.57 to 0.34. Pharmacy consulted to start heparin infusion. Not on anticoagulation PTA.   Heparin level after bolus and rate increase came back at 0.45, on 950 units/hr. Hgb 11, plt 212. No infusion issues. No s/sx of bleeding.   Goal of Therapy:  Heparin level 0.3-0.7 units/ml Monitor platelets by anticoagulation protocol: Yes   Plan:  Continue heparin infusion at 950 units/hr Check heparin level in 8 hours Monitor daily heparin level, CBC, and for s/sx of bleeding  Doylene Canard, PharmD Clinical Pharmacist  Pager: [phone] Phone: [phone] 03/19/2018,10:44 AM  ADDENDUM Heparin level came back at 0.3, on 950 units/hr. No s/sx of bleeding. No infusion issues per nursing. Will increase slightly to 1000 units/hr to keep in goal range since on lower end. Will monitor daily HL.  Doylene Canard, PharmD Clinical Pharmacist

## 2018-03-19 NOTE — Plan of Care (Signed)
  Problem: Clinical Measurements: Goal: Will remain free from infection Outcome: Progressing Note:  No s/s of infection noted. Goal: Respiratory complications will improve Outcome: Progressing Note:  No s/s of respiratory complications noted.   Problem: Activity: Goal: Risk for activity intolerance will decrease Outcome: Progressing Note:  Ambulates to bathroom with standby assist without difficulty.   Problem: Elimination: Goal: Will not experience complications related to urinary retention Outcome: Progressing Note:  Voids without difficulty.  No s/s of urinary retention.   Problem: Pain Managment: Goal: General experience of comfort will improve Outcome: Progressing Note:  PO meds effective.

## 2018-03-19 NOTE — Progress Notes (Signed)
Called by pharmacy who recommended stopping Valcyclovir based on pt's age and renal function. Recommend this be followed up by her PCP.  Kerin Ransom PA-C 03/19/2018 11:15 AM

## 2018-03-19 NOTE — Progress Notes (Signed)
Report received from [name], RN; states that patient is restless and agitated, threatening to leave AMA.  Cardiology fellow paged by [name], RN, and the MD talked to patient over the phone, convincing patient to stay.  Ativan 1 mg IV given per MD order.  Will continue to monitor.  Kristine Oliver

## 2018-03-20 LAB — CBC
HEMATOCRIT: 37.6 % (ref 36.0–46.0)
HEMOGLOBIN: 11.7 g/dL — AB (ref 12.0–15.0)
MCH: 29.1 pg (ref 26.0–34.0)
MCHC: 31.1 g/dL (ref 30.0–36.0)
MCV: 93.5 fL (ref 78.0–100.0)
Platelets: 238 10*3/uL (ref 150–400)
RBC: 4.02 MIL/uL (ref 3.87–5.11)
RDW: 15.1 % (ref 11.5–15.5)
WBC: 7.2 10*3/uL (ref 4.0–10.5)

## 2018-03-20 LAB — BASIC METABOLIC PANEL
Anion gap: 8 (ref 5–15)
BUN: 15 mg/dL (ref 8–23)
CALCIUM: 10.4 mg/dL — AB (ref 8.9–10.3)
CO2: 21 mmol/L — ABNORMAL LOW (ref 22–32)
Chloride: 110 mmol/L (ref 98–111)
Creatinine, Ser: 1.25 mg/dL — ABNORMAL HIGH (ref 0.44–1.00)
GFR calc Af Amer: 44 mL/min — ABNORMAL LOW (ref >60)
GFR, EST NON AFRICAN AMERICAN: 38 mL/min — AB (ref >60)
GLUCOSE: 92 mg/dL (ref 70–99)
Potassium: 4.3 mmol/L (ref 3.5–5.1)
SODIUM: 139 mmol/L (ref 135–145)

## 2018-03-20 LAB — HEPARIN LEVEL (UNFRACTIONATED): Heparin Unfractionated: 0.42 IU/mL (ref 0.30–0.70)

## 2018-03-20 MED ORDER — MORPHINE SULFATE (PF) 2 MG/ML IV SOLN: 2.0000 mg | INTRAVENOUS | Status: DC | PRN

## 2018-03-20 MED ORDER — HYDRALAZINE HCL 25 MG PO TABS
25.0000 mg | ORAL_TABLET | Freq: Three times a day (TID) | ORAL | Status: DC
  Administered 2018-03-20 – 2018-03-22 (×6): 25 mg via ORAL
  Filled 2018-03-20 (×7): qty 1

## 2018-03-20 MED ORDER — HYDRALAZINE HCL 10 MG PO TABS: 10.0000 mg | ORAL_TABLET | Freq: Three times a day (TID) | ORAL | Status: DC | PRN

## 2018-03-20 MED ORDER — NITROGLYCERIN IN D5W 200-5 MCG/ML-% IV SOLN
0.0000 ug/min | INTRAVENOUS | Status: DC
  Administered 2018-03-20: 5 ug/min via INTRAVENOUS
  Administered 2018-03-21 (×2): 20 ug/min via INTRAVENOUS

## 2018-03-20 MED ORDER — MAGNESIUM HYDROXIDE 400 MG/5ML PO SUSP: 15.0000 mL | Freq: Once | ORAL | Status: DC

## 2018-03-20 MED ORDER — NITROGLYCERIN 0.4 MG SL SUBL
SUBLINGUAL_TABLET | SUBLINGUAL | Status: AC
  Filled 2018-03-20: qty 1

## 2018-03-20 MED ORDER — BISACODYL 10 MG RE SUPP
10.0000 mg | Freq: Every day | RECTAL | Status: DC | PRN
Start: 2018-03-20 — End: 2018-03-20
  Filled 2018-03-20: qty 1

## 2018-03-20 MED ORDER — HYDRALAZINE HCL 20 MG/ML IJ SOLN: 5.0000 mg | Freq: Four times a day (QID) | INTRAMUSCULAR | Status: DC | PRN

## 2018-03-20 MED ORDER — ISOSORBIDE MONONITRATE ER 60 MG PO TB24
60.0000 mg | ORAL_TABLET | Freq: Every day | ORAL | Status: DC
  Administered 2018-03-20: 60 mg via ORAL
  Filled 2018-03-20: qty 1

## 2018-03-20 MED ORDER — MORPHINE SULFATE (PF) 2 MG/ML IV SOLN
2.0000 mg | Freq: Once | INTRAVENOUS | Status: AC
  Administered 2018-03-20: 2 mg via INTRAVENOUS
  Filled 2018-03-20: qty 1

## 2018-03-20 MED ORDER — NITROGLYCERIN IN D5W 200-5 MCG/ML-% IV SOLN
INTRAVENOUS | Status: AC
  Filled 2018-03-20: qty 250

## 2018-03-20 MED ORDER — HYDROCORTISONE 0.5 % EX CREA
TOPICAL_CREAM | Freq: Three times a day (TID) | CUTANEOUS | Status: DC
  Filled 2018-03-20: qty 28.35

## 2018-03-20 MED ORDER — HYDROCORTISONE 1 % EX CREA
TOPICAL_CREAM | Freq: Three times a day (TID) | CUTANEOUS | Status: DC
  Administered 2018-03-20 (×2): via TOPICAL
  Filled 2018-03-20: qty 28

## 2018-03-20 NOTE — Progress Notes (Signed)
Pt refusing to have her CBG checked at breakfast. Pt educated on the importance. Pt still refused. Will cont to monitor pt.

## 2018-03-20 NOTE — Progress Notes (Addendum)
Progress Note  Patient Name: Kristine Oliver Date of Encounter: 03/20/2018  Primary Cardiologist: [name], MD   Subjective   Called to see secondary to chest pain. Pt yelling out in pain. "the worst pain ever", "I'm dying". She says she has had chest pain all night but didn't tell Rn till this am when her chest pain became worse. She tells me its the same as her pre PCI pain- severe mid sternal pain that radiates down her arms. No diaphoresis, no vomiting. Her B/P is 951 systolic. Troponin has been positive but flat- 0.34. Her EKG shows no acute changes.   Inpatient Medications    Scheduled Meds: . aspirin  81 mg Oral Daily  . clopidogrel  75 mg Oral Daily  . cycloSPORINE  1 drop Both Eyes BID  . gabapentin  300 mg Oral BID  . insulin aspart  0-15 Units Subcutaneous TID WC  . isosorbide mononitrate  60 mg Oral Daily  . levothyroxine  75 mcg Oral QAC breakfast  . nitroGLYCERIN      . oxyCODONE  5 mg Oral QHS  . ramipril  10 mg Oral Daily  . rosuvastatin  10 mg Oral q1800  . sodium chloride flush  3 mL Intravenous Q12H  . zolpidem  5 mg Oral QHS   Continuous Infusions: . sodium chloride    . [START ON 03/21/2018] sodium chloride     Followed by  . [START ON 03/21/2018] sodium chloride    . heparin 1,000 Units/hr (03/19/18 1830)   PRN Meds: sodium chloride, ALPRAZolam, hydrALAZINE, nitroGLYCERIN, ondansetron (ZOFRAN) IV, oxyCODONE, sodium chloride flush   Vital Signs    Vitals:   03/19/18 2016 03/20/18 0604 03/20/18 0615 03/20/18 0822  BP:   (!) 209/70 (!) 234/73  Pulse:  (!) 59 (!) 57   Resp:  15 17   Temp: 98 F (36.7 C)  98 F (36.7 C)   TempSrc: Oral  Oral   SpO2:   99%   Weight:  77.9 kg    Height:        Intake/Output Summary (Last 24 hours) at 03/20/2018 0843 Last data filed at 03/20/2018 0700 Gross per 24 hour  Intake 243 ml  Output 1600 ml  Net -1357 ml   Filed Weights   03/18/18 1221 03/19/18 0327 03/20/18 0604  Weight: 81.6 kg 79.4 kg 77.9 kg      Telemetry    NSR - Personally Reviewed  ECG    NSR- TWI 1, AVL, ST depression V5-V6.  - Personally Reviewed  Physical Exam   GEN: yelling- c/o severe chest pain Neck: No JVD Cardiac: RRR, no murmurs, rubs, or gallops.  Respiratory: Clear to auscultation bilaterally. GI: Soft, nontender, non-distended  MS: No edema; No deformity. Neuro:  Nonfocal  Psych: Normal affect   Labs    Chemistry Recent Labs  Lab 03/18/18 1221 03/19/18 0423  NA 137 139  K 5.1 4.9  CL 107 109  CO2 22 22  GLUCOSE 101* 89  BUN 16 16  CREATININE 1.28* 1.47*  CALCIUM 10.8* 10.4*  GFRNONAA 37* 31*  GFRAA 43* 36*  ANIONGAP 8 8     Hematology Recent Labs  Lab 03/18/18 1221 03/19/18 0423 03/20/18 0351  WBC 9.3 6.8 7.2  RBC 4.24 3.71* 4.02  HGB 12.4 11.0* 11.7*  HCT 41.0 34.9* 37.6  MCV 96.7 94.1 93.5  MCH 29.2 29.6 29.1  MCHC 30.2 31.5 31.1  RDW 15.8* 15.4 15.1  PLT 268 212 238  Cardiac Enzymes Recent Labs  Lab 03/18/18 1645 03/18/18 2358 03/19/18 0423  TROPONINI 0.58* 0.34* 0.34*    Recent Labs  Lab 03/18/18 1225  TROPIPOC 0.57*     BNPNo results for input(s): BNP, PROBNP in the last 168 hours.   DDimer No results for input(s): DDIMER in the last 168 hours.   Radiology    Dg Chest 2 View  Result Date: 03/18/2018 CLINICAL DATA:  Chest pain EXAM: CHEST - 2 VIEW COMPARISON:  12/30/2017 FINDINGS: Borderline heart size. Long-standing right mediastinal convexity likely from the ascending aorta tortuosity. There is generalized interstitial coarsening that is chronic based on priors. No acute opacity. Spondylosis and exaggerated thoracolumbar kyphosis. IMPRESSION: 1. No acute finding. 2. Stable interstitial coarsening, suspect interstitial lung disease. Consider chest CT characterization. 3. Enlarged heart Electronically Signed   By: Monte Fantasia M.D.   On: 03/18/2018 12:54    Cardiac Studies   03/19/18- Echo Study Conclusions  - Left ventricle: The cavity size  was normal. There was mild   concentric hypertrophy. Systolic function was normal. The   estimated ejection fraction was in the range of 60% to 65%. Wall   motion was normal; there were no regional wall motion   abnormalities. Doppler parameters are consistent with abnormal   left ventricular relaxation (grade 1 diastolic dysfunction).   Doppler parameters are consistent with elevated ventricular   end-diastolic filling pressure. - Aortic valve: There was trivial regurgitation. - Mitral valve: There was mild regurgitation. - Left atrium: The atrium was mildly dilated. - Right ventricle: Systolic function was normal. - Tricuspid valve: There was mild regurgitation. - Pulmonic valve: There was no regurgitation. - Pulmonary arteries: Systolic pressure was within the normal   range. - Inferior vena cava: The vessel was normal in size. - Pericardium, extracardiac: There was no pericardial effusion  Patient Profile     84 y.o. female with a h/o CAD s/p NSTEMI in 2005 w/ LAD DES, HTN, HL, DM II, HFpEF, and bradycardia w/ chronotropic incompetence (refused PPM), who presented to the ED 03/18/18 with a 10 day h/o intermittent chest pain and has been found to have a troponin of 0.57.  Assessment & Plan    [place]- No ST elevation on her EKG  Known CAD- H/O LAD PCI with DES  Spinal stenosis and kyphosis With severity of her symptoms and lack of EKG changes I wonder if this could be referred pain  Chronic pain- On Oxycodone  HTN- Poor control-add Hydralazine   IDDM- Controlled  Renal insufficiency SCr bumped-1.47-Hold ACE  Bi Polar history On no medications- I think she is acting out secondary to back pain  Plan:  I spoke with Dr Julianne Handler- with no EKG changes we will not rush her off to the cath lab. Will try pain control and IV NTG.     For questions or updates, please contact [place] Please consult [email or web address] for contact info under Cardiology/STEMI.       Angelena Form, PA-C  03/20/2018, 8:43 AM

## 2018-03-20 NOTE — Progress Notes (Signed)
Pt complained of chest pain 9/10. EKG obtained. 3 sl nitro given. BP in the 200's. PA on call made aware. Pt started on a nitro drip, IV morphine given and IV Zofran. Follow up EKG obtained. Pt resting in bed pt states her pain is going down to a 3.  Will cont to monitor pt.

## 2018-03-20 NOTE — Progress Notes (Signed)
Patient refused blood sugar check at bedtime.

## 2018-03-20 NOTE — Progress Notes (Signed)
[place] for heparin Indication: chest pain/ACS  Allergies  Allergen Reactions  . Pravastatin Other (See Comments)    Muscle aches  . Prednisone Nausea And Vomiting    Patient Measurements: Height: 4' 11.5" (151.1 cm) Weight: 171 lb 11.2 oz (77.9 kg) IBW/kg (Calculated) : 44.35 Heparin Dosing Weight: 63.3kg  Vital Signs: Temp: 97.7 F (36.5 C) (08/18 0902) Temp Source: Axillary (08/18 0902) BP: 129/107 (08/18 0956) Pulse Rate: 62 (08/18 0902)  Labs: Recent Labs    03/18/18 1221 03/18/18 1645  03/18/18 2358 03/19/18 0423 03/19/18 0755 03/19/18 1547 03/20/18 0351 03/20/18 0855  HGB 12.4  --   --   --  11.0*  --   --  11.7*  --   HCT 41.0  --   --   --  34.9*  --   --  37.6  --   PLT 268  --   --   --  212  --   --  238  --   HEPARINUNFRC  --   --    < > 0.17*  --  0.45 0.30 0.42  --   CREATININE 1.28*  --   --   --  1.47*  --   --   --  1.25*  TROPONINI  --  0.58*  --  0.34* 0.34*  --   --   --   --    < > = values in this interval not displayed.    Estimated Creatinine Clearance: 30 mL/min (A) (by C-G formula based on SCr of 1.25 mg/dL (H)).  Assessment: 84 y.o. female presented to the ED with CP. Troponin increased 0.57 to 0.34. Pharmacy consulted to start heparin infusion. Not on anticoagulation PTA.   Heparin level continues to remain therapeutic at 0.42, on 1000 units/hr. Hgb 11.7, plt 238. No infusion issues. No s/sx of bleeding.   Goal of Therapy:  Heparin level 0.3-0.7 units/ml Monitor platelets by anticoagulation protocol: Yes   Plan:  Continue heparin infusion at 1000 units/hr Monitor daily heparin level, CBC, and for s/sx of bleeding  Doylene Canard, PharmD Clinical Pharmacist  Pager: [phone] Phone: [phone] 03/20/2018,12:05 PM

## 2018-03-20 NOTE — H&P (View-Only) (Signed)
Progress Note  Patient Name: Kristine Oliver Date of Encounter: 03/20/2018  Primary Cardiologist: [name], MD   Subjective   Called to see secondary to chest pain. Pt yelling out in pain. "the worst pain ever", "I'm dying". She says she has had chest pain all night but didn't tell Rn till this am when her chest pain became worse. She tells me its the same as her pre PCI pain- severe mid sternal pain that radiates down her arms. No diaphoresis, no vomiting. Her B/P is 656 systolic. Troponin has been positive but flat- 0.34. Her EKG shows no acute changes.   Inpatient Medications    Scheduled Meds: . aspirin  81 mg Oral Daily  . clopidogrel  75 mg Oral Daily  . cycloSPORINE  1 drop Both Eyes BID  . gabapentin  300 mg Oral BID  . insulin aspart  0-15 Units Subcutaneous TID WC  . isosorbide mononitrate  60 mg Oral Daily  . levothyroxine  75 mcg Oral QAC breakfast  . nitroGLYCERIN      . oxyCODONE  5 mg Oral QHS  . ramipril  10 mg Oral Daily  . rosuvastatin  10 mg Oral q1800  . sodium chloride flush  3 mL Intravenous Q12H  . zolpidem  5 mg Oral QHS   Continuous Infusions: . sodium chloride    . [START ON 03/21/2018] sodium chloride     Followed by  . [START ON 03/21/2018] sodium chloride    . heparin 1,000 Units/hr (03/19/18 1830)   PRN Meds: sodium chloride, ALPRAZolam, hydrALAZINE, nitroGLYCERIN, ondansetron (ZOFRAN) IV, oxyCODONE, sodium chloride flush   Vital Signs    Vitals:   03/19/18 2016 03/20/18 0604 03/20/18 0615 03/20/18 0822  BP:   (!) 209/70 (!) 234/73  Pulse:  (!) 59 (!) 57   Resp:  15 17   Temp: 98 F (36.7 C)  98 F (36.7 C)   TempSrc: Oral  Oral   SpO2:   99%   Weight:  77.9 kg    Height:        Intake/Output Summary (Last 24 hours) at 03/20/2018 0843 Last data filed at 03/20/2018 0700 Gross per 24 hour  Intake 243 ml  Output 1600 ml  Net -1357 ml   Filed Weights   03/18/18 1221 03/19/18 0327 03/20/18 0604  Weight: 81.6 kg 79.4 kg 77.9 kg      Telemetry    NSR - Personally Reviewed  ECG    NSR- TWI 1, AVL, ST depression V5-V6.  - Personally Reviewed  Physical Exam   GEN: yelling- c/o severe chest pain Neck: No JVD Cardiac: RRR, no murmurs, rubs, or gallops.  Respiratory: Clear to auscultation bilaterally. GI: Soft, nontender, non-distended  MS: No edema; No deformity. Neuro:  Nonfocal  Psych: Normal affect   Labs    Chemistry Recent Labs  Lab 03/18/18 1221 03/19/18 0423  NA 137 139  K 5.1 4.9  CL 107 109  CO2 22 22  GLUCOSE 101* 89  BUN 16 16  CREATININE 1.28* 1.47*  CALCIUM 10.8* 10.4*  GFRNONAA 37* 31*  GFRAA 43* 36*  ANIONGAP 8 8     Hematology Recent Labs  Lab 03/18/18 1221 03/19/18 0423 03/20/18 0351  WBC 9.3 6.8 7.2  RBC 4.24 3.71* 4.02  HGB 12.4 11.0* 11.7*  HCT 41.0 34.9* 37.6  MCV 96.7 94.1 93.5  MCH 29.2 29.6 29.1  MCHC 30.2 31.5 31.1  RDW 15.8* 15.4 15.1  PLT 268 212 238  Cardiac Enzymes Recent Labs  Lab 03/18/18 1645 03/18/18 2358 03/19/18 0423  TROPONINI 0.58* 0.34* 0.34*    Recent Labs  Lab 03/18/18 1225  TROPIPOC 0.57*     BNPNo results for input(s): BNP, PROBNP in the last 168 hours.   DDimer No results for input(s): DDIMER in the last 168 hours.   Radiology    Dg Chest 2 View  Result Date: 03/18/2018 CLINICAL DATA:  Chest pain EXAM: CHEST - 2 VIEW COMPARISON:  12/30/2017 FINDINGS: Borderline heart size. Long-standing right mediastinal convexity likely from the ascending aorta tortuosity. There is generalized interstitial coarsening that is chronic based on priors. No acute opacity. Spondylosis and exaggerated thoracolumbar kyphosis. IMPRESSION: 1. No acute finding. 2. Stable interstitial coarsening, suspect interstitial lung disease. Consider chest CT characterization. 3. Enlarged heart Electronically Signed   By: Monte Fantasia M.D.   On: 03/18/2018 12:54    Cardiac Studies   03/19/18- Echo Study Conclusions  - Left ventricle: The cavity size  was normal. There was mild   concentric hypertrophy. Systolic function was normal. The   estimated ejection fraction was in the range of 60% to 65%. Wall   motion was normal; there were no regional wall motion   abnormalities. Doppler parameters are consistent with abnormal   left ventricular relaxation (grade 1 diastolic dysfunction).   Doppler parameters are consistent with elevated ventricular   end-diastolic filling pressure. - Aortic valve: There was trivial regurgitation. - Mitral valve: There was mild regurgitation. - Left atrium: The atrium was mildly dilated. - Right ventricle: Systolic function was normal. - Tricuspid valve: There was mild regurgitation. - Pulmonic valve: There was no regurgitation. - Pulmonary arteries: Systolic pressure was within the normal   range. - Inferior vena cava: The vessel was normal in size. - Pericardium, extracardiac: There was no pericardial effusion  Patient Profile     84 y.o. female with a h/o CAD s/p NSTEMI in 2005 w/ LAD DES, HTN, HL, DM II, HFpEF, and bradycardia w/ chronotropic incompetence (refused PPM), who presented to the ED 03/18/18 with a 10 day h/o intermittent chest pain and has been found to have a troponin of 0.57.  Assessment & Plan    [place]- No ST elevation on her EKG  Known CAD- H/O LAD PCI with DES  Spinal stenosis and kyphosis With severity of her symptoms and lack of EKG changes I wonder if this could be referred pain  Chronic pain- On Oxycodone  HTN- Poor control-add Hydralazine   IDDM- Controlled  Renal insufficiency SCr bumped-1.47-Hold ACE  Bi Polar history On no medications- I think she is acting out secondary to back pain  Plan:  I spoke with Dr Julianne Handler- with no EKG changes we will not rush her off to the cath lab. Will try pain control and IV NTG.     For questions or updates, please contact [place] Please consult [email or web address] for contact info under Cardiology/STEMI.       Angelena Form, PA-C  03/20/2018, 8:43 AM

## 2018-03-21 ENCOUNTER — Encounter (HOSPITAL_COMMUNITY)
Admission: EM | Disposition: A | Discharge status: Home / Self Care | Payer: Self-pay | Source: Home / Self Care | Attending: Cardiovascular Disease

## 2018-03-21 ENCOUNTER — Other Ambulatory Visit: Payer: Self-pay

## 2018-03-21 ENCOUNTER — Encounter (HOSPITAL_COMMUNITY): Payer: Self-pay | Admitting: Interventional Cardiology

## 2018-03-21 DIAGNOSIS — I10 Essential (primary) hypertension: Secondary | ICD-10-CM

## 2018-03-21 DIAGNOSIS — I2510 Atherosclerotic heart disease of native coronary artery without angina pectoris: Secondary | ICD-10-CM

## 2018-03-21 HISTORY — PX: CORONARY STENT INTERVENTION: CATH118234

## 2018-03-21 HISTORY — PX: LEFT HEART CATH AND CORONARY ANGIOGRAPHY: CATH118249

## 2018-03-21 LAB — BASIC METABOLIC PANEL
ANION GAP: 7 (ref 5–15)
BUN: 18 mg/dL (ref 8–23)
CALCIUM: 10.7 mg/dL — AB (ref 8.9–10.3)
CHLORIDE: 110 mmol/L (ref 98–111)
CO2: 22 mmol/L (ref 22–32)
Creatinine, Ser: 1.37 mg/dL — ABNORMAL HIGH (ref 0.44–1.00)
GFR calc non Af Amer: 34 mL/min — ABNORMAL LOW (ref >60)
GFR, EST AFRICAN AMERICAN: 40 mL/min — AB (ref >60)
Glucose, Bld: 105 mg/dL — ABNORMAL HIGH (ref 70–99)
Potassium: 5 mmol/L (ref 3.5–5.1)
SODIUM: 139 mmol/L (ref 135–145)

## 2018-03-21 LAB — HEPARIN LEVEL (UNFRACTIONATED): Heparin Unfractionated: 0.29 IU/mL — ABNORMAL LOW (ref 0.30–0.70)

## 2018-03-21 LAB — CBC
HCT: 39.9 % (ref 36.0–46.0)
Hemoglobin: 12.2 g/dL (ref 12.0–15.0)
MCH: 29.5 pg (ref 26.0–34.0)
MCHC: 30.6 g/dL (ref 30.0–36.0)
MCV: 96.4 fL (ref 78.0–100.0)
Platelets: 250 10*3/uL (ref 150–400)
RBC: 4.14 MIL/uL (ref 3.87–5.11)
RDW: 15.7 % — ABNORMAL HIGH (ref 11.5–15.5)
WBC: 8.2 10*3/uL (ref 4.0–10.5)

## 2018-03-21 LAB — POCT ACTIVATED CLOTTING TIME
Activated Clotting Time: 285 seconds
Activated Clotting Time: 367 seconds

## 2018-03-21 LAB — GLUCOSE, CAPILLARY: Glucose-Capillary: 107 mg/dL — ABNORMAL HIGH (ref 70–99)

## 2018-03-21 SURGERY — LEFT HEART CATH AND CORONARY ANGIOGRAPHY
Anesthesia: LOCAL

## 2018-03-21 MED ORDER — HYDRALAZINE HCL 20 MG/ML IJ SOLN
INTRAMUSCULAR | Status: DC | PRN
  Administered 2018-03-21: 10 mg via INTRAVENOUS

## 2018-03-21 MED ORDER — HEPARIN SODIUM (PORCINE) 1000 UNIT/ML IJ SOLN
INTRAMUSCULAR | Status: AC
  Filled 2018-03-21: qty 1

## 2018-03-21 MED ORDER — ACETAMINOPHEN 325 MG PO TABS: 650.0000 mg | ORAL_TABLET | ORAL | Status: DC | PRN

## 2018-03-21 MED ORDER — LIDOCAINE HCL (PF) 1 % IJ SOLN
INTRAMUSCULAR | Status: DC | PRN
  Administered 2018-03-21: 2 mL

## 2018-03-21 MED ORDER — NITROGLYCERIN 1 MG/10 ML FOR IR/CATH LAB
INTRA_ARTERIAL | Status: AC
  Filled 2018-03-21: qty 10

## 2018-03-21 MED ORDER — SODIUM CHLORIDE 0.9 % IV SOLN: 250.0000 mL | INTRAVENOUS | Status: DC | PRN

## 2018-03-21 MED ORDER — HEPARIN SODIUM (PORCINE) 1000 UNIT/ML IJ SOLN
INTRAMUSCULAR | Status: DC | PRN
  Administered 2018-03-21: 5000 [IU] via INTRAVENOUS
  Administered 2018-03-21: 3000 [IU] via INTRAVENOUS
  Administered 2018-03-21: 4000 [IU] via INTRAVENOUS

## 2018-03-21 MED ORDER — LABETALOL HCL 5 MG/ML IV SOLN: 10.0000 mg | INTRAVENOUS | Status: AC | PRN

## 2018-03-21 MED ORDER — SODIUM CHLORIDE 0.9% FLUSH: 3.0000 mL | Freq: Two times a day (BID) | INTRAVENOUS | Status: DC

## 2018-03-21 MED ORDER — FENTANYL CITRATE (PF) 100 MCG/2ML IJ SOLN
INTRAMUSCULAR | Status: DC | PRN
  Administered 2018-03-21 (×3): 25 ug via INTRAVENOUS

## 2018-03-21 MED ORDER — FLEET ENEMA 7-19 GM/118ML RE ENEM
1.0000 | ENEMA | Freq: Once | RECTAL | Status: AC
  Administered 2018-03-22: 01:00:00 1 via RECTAL
  Filled 2018-03-21: qty 1

## 2018-03-21 MED ORDER — ANGIOPLASTY BOOK
Freq: Once | Status: AC
  Administered 2018-03-22: 01:00:00
  Filled 2018-03-21: qty 1

## 2018-03-21 MED ORDER — FENTANYL CITRATE (PF) 100 MCG/2ML IJ SOLN
INTRAMUSCULAR | Status: AC
  Filled 2018-03-21: qty 2

## 2018-03-21 MED ORDER — VERAPAMIL HCL 2.5 MG/ML IV SOLN
INTRAVENOUS | Status: AC
  Filled 2018-03-21: qty 2

## 2018-03-21 MED ORDER — MIDAZOLAM HCL 2 MG/2ML IJ SOLN
INTRAMUSCULAR | Status: DC | PRN
  Administered 2018-03-21 (×2): 1 mg via INTRAVENOUS

## 2018-03-21 MED ORDER — HEART ATTACK BOUNCING BOOK
Freq: Once | Status: AC
  Administered 2018-03-22: 01:00:00
  Filled 2018-03-21: qty 1

## 2018-03-21 MED ORDER — SODIUM CHLORIDE 0.9 % IV SOLN
INTRAVENOUS | Status: AC
  Administered 2018-03-21 (×2): via INTRAVENOUS

## 2018-03-21 MED ORDER — HYDRALAZINE HCL 20 MG/ML IJ SOLN
5.0000 mg | Freq: Four times a day (QID) | INTRAMUSCULAR | Status: DC | PRN
  Filled 2018-03-21: qty 1

## 2018-03-21 MED ORDER — IOHEXOL 350 MG/ML SOLN
INTRAVENOUS | Status: DC | PRN
  Administered 2018-03-21: 100 mL via INTRA_ARTERIAL

## 2018-03-21 MED ORDER — CLOPIDOGREL BISULFATE 75 MG PO TABS: 75.0000 mg | ORAL_TABLET | Freq: Every day | ORAL | Status: DC

## 2018-03-21 MED ORDER — ONDANSETRON HCL 4 MG/2ML IJ SOLN: 4.0000 mg | Freq: Four times a day (QID) | INTRAMUSCULAR | Status: DC | PRN

## 2018-03-21 MED ORDER — HEPARIN (PORCINE) IN NACL 1000-0.9 UT/500ML-% IV SOLN
INTRAVENOUS | Status: AC
  Filled 2018-03-21: qty 1000

## 2018-03-21 MED ORDER — ONDANSETRON HCL 4 MG/2ML IJ SOLN
INTRAMUSCULAR | Status: AC
  Filled 2018-03-21: qty 2

## 2018-03-21 MED ORDER — DOCUSATE SODIUM 100 MG PO CAPS
100.0000 mg | ORAL_CAPSULE | Freq: Every day | ORAL | Status: DC
  Administered 2018-03-21 – 2018-03-22 (×3): 100 mg via ORAL
  Filled 2018-03-21 (×3): qty 1

## 2018-03-21 MED ORDER — NITROGLYCERIN 1 MG/10 ML FOR IR/CATH LAB
INTRA_ARTERIAL | Status: DC | PRN
  Administered 2018-03-21: 200 ug via INTRA_ARTERIAL
  Administered 2018-03-21: 400 ug via INTRA_ARTERIAL

## 2018-03-21 MED ORDER — HYDRALAZINE HCL 20 MG/ML IJ SOLN
INTRAMUSCULAR | Status: AC
  Filled 2018-03-21: qty 1

## 2018-03-21 MED ORDER — ONDANSETRON HCL 4 MG/2ML IJ SOLN
INTRAMUSCULAR | Status: DC | PRN
  Administered 2018-03-21: 4 mg via INTRAVENOUS

## 2018-03-21 MED ORDER — HEPARIN (PORCINE) IN NACL 1000-0.9 UT/500ML-% IV SOLN
INTRAVENOUS | Status: DC | PRN
  Administered 2018-03-21 (×2): 500 mL

## 2018-03-21 MED ORDER — LIDOCAINE HCL (PF) 1 % IJ SOLN
INTRAMUSCULAR | Status: AC
  Filled 2018-03-21: qty 30

## 2018-03-21 MED ORDER — ASPIRIN 81 MG PO CHEW: 81.0000 mg | CHEWABLE_TABLET | Freq: Every day | ORAL | Status: DC

## 2018-03-21 MED ORDER — PANTOPRAZOLE SODIUM 40 MG PO TBEC
40.0000 mg | DELAYED_RELEASE_TABLET | Freq: Every day | ORAL | Status: DC
  Administered 2018-03-21 – 2018-03-22 (×2): 40 mg via ORAL
  Filled 2018-03-21 (×2): qty 1

## 2018-03-21 MED ORDER — SODIUM CHLORIDE 0.9% FLUSH: 3.0000 mL | INTRAVENOUS | Status: DC | PRN

## 2018-03-21 MED ORDER — VERAPAMIL HCL 2.5 MG/ML IV SOLN
INTRAVENOUS | Status: DC | PRN
  Administered 2018-03-21 (×2): 10 mL via INTRA_ARTERIAL

## 2018-03-21 MED ORDER — MIDAZOLAM HCL 2 MG/2ML IJ SOLN
INTRAMUSCULAR | Status: AC
  Filled 2018-03-21: qty 2

## 2018-03-21 MED ORDER — HYDRALAZINE HCL 20 MG/ML IJ SOLN: 5.0000 mg | INTRAMUSCULAR | Status: AC | PRN

## 2018-03-21 SURGICAL SUPPLY — 19 items
BALLN SAPPHIRE 2.5X15 (BALLOONS) ×2
BALLN SAPPHIRE ~~LOC~~ 3.0X18 (BALLOONS) ×1 IMPLANT
BALLOON SAPPHIRE 2.5X15 (BALLOONS) IMPLANT
CATH 5FR JL3.5 JR4 ANG PIG MP (CATHETERS) ×1 IMPLANT
CATH LAUNCHER 6FR EBU3.5 (CATHETERS) ×1 IMPLANT
DEVICE RAD COMP TR BAND LRG (VASCULAR PRODUCTS) ×1 IMPLANT
GLIDESHEATH SLEND SS 6F .021 (SHEATH) ×1 IMPLANT
GUIDEWIRE INQWIRE 1.5J.035X260 (WIRE) IMPLANT
INQWIRE 1.5J .035X260CM (WIRE) ×2
KIT ENCORE 26 ADVANTAGE (KITS) ×1 IMPLANT
KIT HEART LEFT (KITS) ×2 IMPLANT
KIT HEMO VALVE WATCHDOG (MISCELLANEOUS) ×1 IMPLANT
PACK CARDIAC CATHETERIZATION (CUSTOM PROCEDURE TRAY) ×2 IMPLANT
STENT SIERRA 2.75 X 28 MM (Permanent Stent) ×1 IMPLANT
TRANSDUCER W/STOPCOCK (MISCELLANEOUS) ×2 IMPLANT
TUBING CIL FLEX 10 FLL-RA (TUBING) ×2 IMPLANT
WIRE ASAHI PROWATER 180CM (WIRE) ×1 IMPLANT
WIRE HI TORQ BMW 190CM (WIRE) ×1 IMPLANT
WIRE HI TORQ VERSACORE-J 145CM (WIRE) ×1 IMPLANT

## 2018-03-21 NOTE — Interval H&P Note (Signed)
Cath Lab Visit (complete for each Cath Lab visit)  Clinical Evaluation Leading to the Procedure:   ACS: Yes.    Non-ACS:    Anginal Classification: CCS IV  Anti-ischemic medical therapy: Minimal Therapy (1 class of medications)  Non-Invasive Test Results: No non-invasive testing performed  Prior CABG: No previous CABG      History and Physical Interval Note:  03/21/2018 10:33 AM  Kristine Oliver  has presented today for surgery, with the diagnosis of chest pain  The various methods of treatment have been discussed with the patient and family. After consideration of risks, benefits and other options for treatment, the patient has consented to  Procedure(s): LEFT HEART CATH AND CORONARY ANGIOGRAPHY (N/A) as a surgical intervention .  The patient's history has been reviewed, patient examined, no change in status, stable for surgery.  I have reviewed the patient's chart and labs.  Questions were answered to the patient's satisfaction.     Larae Grooms

## 2018-03-21 NOTE — Care Management Important Message (Signed)
Important Message  Patient Details  Name: Kristine Oliver MRN: [id number] Date of Birth: November 23, 1930   Medicare Important Message Given:  Yes    Erenest Rasher, RN 03/21/2018, 4:21 PM

## 2018-03-21 NOTE — Progress Notes (Signed)
TR BAND REMOVAL  LOCATION:    right radial  DEFLATED PER PROTOCOL:    Yes.    TIME BAND OFF / DRESSING APPLIED:    2030   SITE UPON ARRIVAL:    Level 0  SITE AFTER BAND REMOVAL:    Level 0  CIRCULATION SENSATION AND MOVEMENT:    Within Normal Limits   Yes.    COMMENTS:   Post TRband instructions given, good cap refill

## 2018-03-21 NOTE — Progress Notes (Signed)
Patient arrived to unit at 1215 with complaints of 8/10 chest pain radiating down both arms. Patient on nitroglycerin infusion at 20 mcg and given emotional support and assured that the nitroglycerin would help the pain. Also requested oxycodone for chronic back pain. Patient stated that the pain in chest and arms was subsiding. Stated that the pain was reoccuring and blood pressure was increasing,  Given oxycodone and hydralazine po for back pain and high blood pressure. (IV hydralazine not given because po was due) Increased IV nitroglycerin for blood pressure and recurring pain from 20 mcg to 25 mcg. Pain in back, chest and arms subsided and patient sleeping.  Blood pressure decreased to SBP of 88, decreased nitroglycerin to 20 mcg. Blood pressure increased to 130's to 140's which is where patients blood pressure I usually (per patient) No further complaints of pain, Patient requested oxycodone be given at 1900 to prevent chronic back pain from reoccurring.

## 2018-03-21 NOTE — Progress Notes (Signed)
Progress Note  Patient Name: Kristine Oliver Date of Encounter: 03/21/2018  Primary Cardiologist: [name], MD   Subjective   Patient now post-cath, see results below. Resting comfortably in bed, no concerns.  Inpatient Medications    Scheduled Meds: . aspirin  81 mg Oral Daily  . clopidogrel  75 mg Oral Daily  . cycloSPORINE  1 drop Both Eyes BID  . docusate sodium  100 mg Oral Daily  . gabapentin  300 mg Oral BID  . hydrALAZINE  25 mg Oral Q8H  . hydrocortisone cream   Topical TID  . insulin aspart  0-15 Units Subcutaneous TID WC  . levothyroxine  75 mcg Oral QAC breakfast  . magnesium hydroxide  15 mL Oral Once  . oxyCODONE  5 mg Oral QHS  . pantoprazole  40 mg Oral Daily  . rosuvastatin  10 mg Oral q1800  . sodium chloride flush  3 mL Intravenous Q12H  . zolpidem  5 mg Oral QHS   Continuous Infusions: . sodium chloride 100 mL/hr at 03/21/18 1527  . sodium chloride    . nitroGLYCERIN 20 mcg/min (03/21/18 1527)   PRN Meds: sodium chloride, acetaminophen, ALPRAZolam, hydrALAZINE, hydrALAZINE, labetalol, morphine injection, nitroGLYCERIN, ondansetron (ZOFRAN) IV, oxyCODONE, sodium chloride flush   Vital Signs    Vitals:   03/21/18 1016 03/21/18 1044 03/21/18 1224 03/21/18 1520  BP: (!) 158/73  (!) 160/40 (!) 119/31  Pulse: (!) 58  (!) 58 60  Resp:   14 (!) 9  Temp: 98 F (36.7 C)  97.6 F (36.4 C) 97.6 F (36.4 C)  TempSrc: Oral  Oral Oral  SpO2: 100% 100%  95%  Weight:      Height:        Intake/Output Summary (Last 24 hours) at 03/21/2018 1647 Last data filed at 03/21/2018 1357 Gross per 24 hour  Intake 1017.16 ml  Output 250 ml  Net 767.16 ml   Filed Weights   03/19/18 0327 03/20/18 0604 03/21/18 0300  Weight: 79.4 kg 77.9 kg 77.9 kg    Telemetry    NSR - Personally Reviewed  ECG    NSR- TWI 1, AVL, ST depression V5-V6.  - Personally Reviewed  Physical Exam   GEN: resting in bed Neck: No JVD Cardiac: RRR, no murmurs, rubs, or  gallops.  Respiratory: Clear to auscultation bilaterally. GI: Soft, nontender, non-distended  MS: No edema; No deformity. Neuro:  Nonfocal  Psych: Normal affect   Labs    Chemistry Recent Labs  Lab 03/19/18 0423 03/20/18 0855 03/21/18 0602  NA 139 139 139  K 4.9 4.3 5.0  CL 109 110 110  CO2 22 21* 22  GLUCOSE 89 92 105*  BUN 16 15 18   CREATININE 1.47* 1.25* 1.37*  CALCIUM 10.4* 10.4* 10.7*  GFRNONAA 31* 38* 34*  GFRAA 36* 44* 40*  ANIONGAP 8 8 7      Hematology Recent Labs  Lab 03/19/18 0423 03/20/18 0351 03/21/18 0602  WBC 6.8 7.2 8.2  RBC 3.71* 4.02 4.14  HGB 11.0* 11.7* 12.2  HCT 34.9* 37.6 39.9  MCV 94.1 93.5 96.4  MCH 29.6 29.1 29.5  MCHC 31.5 31.1 30.6  RDW 15.4 15.1 15.7*  PLT 212 238 250    Cardiac Enzymes Recent Labs  Lab 03/18/18 1645 03/18/18 2358 03/19/18 0423  TROPONINI 0.58* 0.34* 0.34*    Recent Labs  Lab 03/18/18 1225  TROPIPOC 0.57*     BNPNo results for input(s): BNP, PROBNP in the last 168 hours.  DDimer No results for input(s): DDIMER in the last 168 hours.   Radiology    No results found.  Cardiac Studies   03/19/18- Echo Study Conclusions  - Left ventricle: The cavity size was normal. There was mild   concentric hypertrophy. Systolic function was normal. The   estimated ejection fraction was in the range of 60% to 65%. Wall   motion was normal; there were no regional wall motion   abnormalities. Doppler parameters are consistent with abnormal   left ventricular relaxation (grade 1 diastolic dysfunction).   Doppler parameters are consistent with elevated ventricular   end-diastolic filling pressure. - Aortic valve: There was trivial regurgitation. - Mitral valve: There was mild regurgitation. - Left atrium: The atrium was mildly dilated. - Right ventricle: Systolic function was normal. - Tricuspid valve: There was mild regurgitation. - Pulmonic valve: There was no regurgitation. - Pulmonary arteries: Systolic  pressure was within the normal   range. - Inferior vena cava: The vessel was normal in size. - Pericardium, extracardiac: There was no pericardial effusion  Cath 03/21/18  Prox RCA to Mid RCA lesion is 25% stenosed.  Mid Cx lesion is 90% stenosed.  A drug-eluting stent was successfully placed using a STENT SIERRA 2.75 X 28 MM.  Post intervention, there is a 0% residual stenosis.  Ost Cx to Prox Cx lesion is 50% stenosed.  Previously placed Mid LAD stent (unknown type) is widely patent.  Prox LAD lesion is 25% stenosed.  LV end diastolic pressure is mildly elevated. LVEDP 17 mm Hg.  There is no aortic valve stenosis.  Vasospasm and discomfort in the right arm from the radial approach.   Recommend uninterrupted dual antiplatelet therapy with Aspirin 81mg  daily and Clopidogrel 75mg  daily for a minimum of 12 months (ACS - Class I recommendation).   Patient was already on Plavix.  COuld consdier Brilinta instead if bleeding risk is low.   Patient Profile     84 y.o. female with a h/o CAD s/p NSTEMI in 2005 w/ LAD DES, HTN, HL, DM II, HFpEF, and bradycardia w/ chronotropic incompetence (refused PPM), who presented to the ED 03/18/18 with a 10 day h/o intermittent chest pain and has been found to have a troponin of 0.57.  Assessment & Plan    NSTEMI: now s/p mid-LCx stent for 90% lesion.  Known CAD- H/O LAD PCI with DES  -continue dual antiplatelet therapy  Chronic pain, back pain- On Oxycodone  HTN- Currently at goal, but has had significantly lability. Will continue to monitor and adjust regimen.  IDDM- Controlled  Renal insufficiency Hold ACE  For questions or updates, please contact [facility] Please consult [email or web address] for contact info under Cardiology/STEMI.      Signed, Buford Dresser, MD  03/21/2018, 4:47 PM

## 2018-03-21 NOTE — Research (Signed)
[facility] Research study protocol presented to patient and family. Questions encouraged and answered.ICF left for review. Research will follow up tomorrow.

## 2018-03-21 NOTE — Progress Notes (Signed)
[facility] for heparin Indication: chest pain/ACS  Allergies  Allergen Reactions  . Pravastatin Other (See Comments)    Muscle aches  . Prednisone Nausea And Vomiting    Patient Measurements: Height: 4' 11.5" (151.1 cm) Weight: 171 lb 12.8 oz (77.9 kg) IBW/kg (Calculated) : 44.35 Heparin Dosing Weight: 63.3kg  Vital Signs: Temp: 98 F (36.7 C) (08/19 0300) Temp Source: Oral (08/19 0030) BP: 130/45 (08/19 0030) Pulse Rate: 61 (08/19 0030)  Labs: Recent Labs    03/18/18 1645 03/18/18 2358 03/19/18 0423  03/19/18 1547 03/20/18 0351 03/20/18 0855 03/21/18 0602  HGB  --   --  11.0*  --   --  11.7*  --  12.2  HCT  --   --  34.9*  --   --  37.6  --  39.9  PLT  --   --  212  --   --  238  --  250  HEPARINUNFRC  --  0.17*  --    < > 0.30 0.42  --  0.29*  CREATININE  --   --  1.47*  --   --   --  1.25* 1.37*  TROPONINI 0.58* 0.34* 0.34*  --   --   --   --   --    < > = values in this interval not displayed.    Estimated Creatinine Clearance: 27.4 mL/min (A) (by C-G formula based on SCr of 1.37 mg/dL (H)).  Assessment: 84 y.o. female presented to the ED with CP. Troponin increased 0.57 to 0.34. Pharmacy consulted to start heparin infusion. Not on anticoagulation PTA.   Heparin level slightly subtherapeutic this AM at 0.29. Per RN, IV heparin has just been turned off for LHC this AM. H/H and Plt wnl. No issues noted.    Goal of Therapy:  Heparin level 0.3-0.7 units/ml Monitor platelets by anticoagulation protocol: Yes   Plan:  F/u [facility] plans after cath   Albertina Parr, PharmD., BCPS Clinical Pharmacist Clinical phone for 03/21/18 until 3:30pm: [phone] If after 3:30pm, please refer to [facility] for unit-specific pharmacist

## 2018-03-22 DIAGNOSIS — Z955 Presence of coronary angioplasty implant and graft: Secondary | ICD-10-CM

## 2018-03-22 DIAGNOSIS — I25119 Atherosclerotic heart disease of native coronary artery with unspecified angina pectoris: Secondary | ICD-10-CM

## 2018-03-22 DIAGNOSIS — E7800 Pure hypercholesterolemia, unspecified: Secondary | ICD-10-CM

## 2018-03-22 LAB — BASIC METABOLIC PANEL
Anion gap: 9 (ref 5–15)
BUN: 14 mg/dL (ref 8–23)
CALCIUM: 10.6 mg/dL — AB (ref 8.9–10.3)
CO2: 22 mmol/L (ref 22–32)
CREATININE: 1.16 mg/dL — AB (ref 0.44–1.00)
Chloride: 109 mmol/L (ref 98–111)
GFR calc Af Amer: 48 mL/min — ABNORMAL LOW (ref >60)
GFR calc non Af Amer: 42 mL/min — ABNORMAL LOW (ref >60)
GLUCOSE: 112 mg/dL — AB (ref 70–99)
Potassium: 4.3 mmol/L (ref 3.5–5.1)
Sodium: 140 mmol/L (ref 135–145)

## 2018-03-22 LAB — CBC
HEMATOCRIT: 37.1 % (ref 36.0–46.0)
Hemoglobin: 11.4 g/dL — ABNORMAL LOW (ref 12.0–15.0)
MCH: 29.2 pg (ref 26.0–34.0)
MCHC: 30.7 g/dL (ref 30.0–36.0)
MCV: 95.1 fL (ref 78.0–100.0)
Platelets: 200 10*3/uL (ref 150–400)
RBC: 3.9 MIL/uL (ref 3.87–5.11)
RDW: 15.5 % (ref 11.5–15.5)
WBC: 6.6 10*3/uL (ref 4.0–10.5)

## 2018-03-22 MED ORDER — BISACODYL 10 MG RE SUPP: 10.0000 mg | Freq: Once | RECTAL | Status: DC

## 2018-03-22 MED ORDER — ISOSORBIDE MONONITRATE ER 30 MG PO TB24
30.0000 mg | ORAL_TABLET | Freq: Every day | ORAL | Status: DC
  Administered 2018-03-22: 30 mg via ORAL
  Filled 2018-03-22: qty 1

## 2018-03-22 MED ORDER — CLOPIDOGREL BISULFATE 75 MG PO TABS: 75.0000 mg | ORAL_TABLET | Freq: Every day | ORAL | 3 refills | Status: AC

## 2018-03-22 MED ORDER — RAMIPRIL 5 MG PO CAPS: 5.0000 mg | ORAL_CAPSULE | Freq: Every day | ORAL | 3 refills | Status: DC

## 2018-03-22 MED ORDER — NITROGLYCERIN 0.4 MG SL SUBL: 0.4000 mg | SUBLINGUAL_TABLET | SUBLINGUAL | 3 refills | Status: DC | PRN

## 2018-03-22 MED ORDER — AMLODIPINE BESYLATE 5 MG PO TABS: 5.0000 mg | ORAL_TABLET | Freq: Every day | ORAL | 3 refills | Status: DC

## 2018-03-22 MED ORDER — ROSUVASTATIN CALCIUM 20 MG PO TABS: 20.0000 mg | ORAL_TABLET | Freq: Every day | ORAL | Status: DC

## 2018-03-22 MED ORDER — ISOSORBIDE MONONITRATE ER 30 MG PO TB24: 30.0000 mg | ORAL_TABLET | Freq: Every day | ORAL | 3 refills | Status: DC

## 2018-03-22 MED ORDER — PANTOPRAZOLE SODIUM 40 MG PO TBEC: 40.0000 mg | DELAYED_RELEASE_TABLET | Freq: Every day | ORAL | 3 refills | Status: DC

## 2018-03-22 MED ORDER — ROSUVASTATIN CALCIUM 20 MG PO TABS: 20.0000 mg | ORAL_TABLET | Freq: Every day | ORAL | 3 refills | Status: DC

## 2018-03-22 MED ORDER — HYDRALAZINE HCL 50 MG PO TABS: 50.0000 mg | ORAL_TABLET | Freq: Three times a day (TID) | ORAL | Status: DC

## 2018-03-22 MED ORDER — AMLODIPINE BESYLATE 5 MG PO TABS
5.0000 mg | ORAL_TABLET | Freq: Every day | ORAL | Status: DC
  Administered 2018-03-22: 5 mg via ORAL
  Filled 2018-03-22: qty 1

## 2018-03-22 NOTE — Progress Notes (Signed)
Pt nephew here to take pt home, pt d/c'd with her belongings via wheelchair escorted by  hospital volunteer.

## 2018-03-22 NOTE — Progress Notes (Signed)
Reviewed AVS discharge instructions with patient/caregiver. Patient/caregiver verbalizes understanding of instructions received. AVS and prescriptions received by patient/caregiver. If present, telemetry box removed and central cardiac monitoring department notified of discharge. Peripheral IV removed, site benign with tip intact.  

## 2018-03-22 NOTE — Discharge Instructions (Signed)
PLEASE REMEMBER TO BRING ALL OF YOUR MEDICATIONS TO EACH OF YOUR FOLLOW-UP OFFICE VISITS.  PLEASE ATTEND ALL SCHEDULED FOLLOW-UP APPOINTMENTS.   Activity: Increase activity slowly as tolerated. You may shower, but no soaking baths (or swimming) for 1 week. No driving for 24 hours. No lifting over 5 lbs for 1 week. No sexual activity for 1 week.   You May Return to Work: in 1 week (if applicable)  Wound Care: You may wash cath site gently with soap and water. Keep cath site clean and dry. If you notice pain, swelling, bleeding or pus at your cath site, please call [phone].   It is important that you take your aspirin and plavix every day and avoid missing doses. Missing doses of these medications puts you at risk of forming blockages in your stent and can cause another heart attack.  Please do not take omeprazole for stomach acid reduction. This medication can decrease the effectiveness of plavix. You have been prescribed pantoprazole (protonix) for an alternative acid reducer.   You can restart your metformin on 03/24/18.

## 2018-03-22 NOTE — Discharge Summary (Signed)
Discharge Summary    Patient ID: Kristine Oliver,  MRN: [id number], DOB/AGE: 1931/01/24 84 y.o.  Admit date: 03/18/2018 Discharge date: 03/22/2018  Primary Care Provider: Lavone Oliver Primary Cardiologist: Kristine Furbish, MD  Discharge Diagnoses    Principal Problem:   NSTEMI (non-ST elevated myocardial infarction) [facility]) Active Problems:   CAD (coronary artery disease)   Type 2 diabetes mellitus with vascular disease ([place])   Hyperlipidemia   Essential hypertension   Allergies Allergies  Allergen Reactions  . Pravastatin Other (See Comments)    Muscle aches  . Prednisone Nausea And Vomiting    Diagnostic Studies/Procedures    Echocardiogram 03/19/18: Study Conclusions  - Left ventricle: The cavity size was normal. There was mild   concentric hypertrophy. Systolic function was normal. The   estimated ejection fraction was in the range of 60% to 65%. Wall   motion was normal; there were no regional wall motion   abnormalities. Doppler parameters are consistent with abnormal   left ventricular relaxation (grade 1 diastolic dysfunction).   Doppler parameters are consistent with elevated ventricular   end-diastolic filling pressure. - Aortic valve: There was trivial regurgitation. - Mitral valve: There was mild regurgitation. - Left atrium: The atrium was mildly dilated. - Right ventricle: Systolic function was normal. - Tricuspid valve: There was mild regurgitation. - Pulmonic valve: There was no regurgitation. - Pulmonary arteries: Systolic pressure was within the normal   range. - Inferior vena cava: The vessel was normal in size. - Pericardium, extracardiac: There was no pericardial effusion.   Left heart catheterization 03/21/18:  Prox RCA to Mid RCA lesion is 25% stenosed.  Mid Cx lesion is 90% stenosed.  A drug-eluting stent was successfully placed using a STENT SIERRA 2.75 X 28 MM.  Post intervention, there is a 0% residual stenosis.  Ost Cx to Prox  Cx lesion is 50% stenosed.  Previously placed Mid LAD stent (unknown type) is widely patent.  Prox LAD lesion is 25% stenosed.  LV end diastolic pressure is mildly elevated. LVEDP 17 mm Hg.  There is no aortic valve stenosis.  Vasospasm and discomfort in the right arm from the radial approach.   Recommend uninterrupted dual antiplatelet therapy with Aspirin 81mg  daily and Clopidogrel 75mg  daily for a minimum of 12 months (ACS - Class I recommendation).   Patient was already on Plavix.  COuld consdier Brilinta instead if bleeding risk is low.   Continue aggressive secondary prevention.  _____________   History of Present Illness     84 y/o ? with a h/o CAD s/p NSTEMI in 2005 w/ LAD DES, HTN, HL, DM II, HFpEF, and bradycardia w/ chronotropic incompetence.  As noted, she suffered a non-ST segment elevation myocardial infarction in 2005 and underwent diagnostic catheterization revealing severe LAD disease which was successfully treated with a Taxus drug-eluting stent.  She subsequently underwent repeat catheterization March 2012 which showed patent LAD stent with a jailed septal perforator.  She otherwise had nonobstructive disease and was medically managed.  She has since undergone stress testing x2 with a nonischemic Myoview in November 2014, and a low risk Myoview in August 2017.  Other history includes bradycardia with first-degree AV block and chronotropic incompetence for which she was previously seen by electrophysiology with recommendation for permanent pacemaker however she has declined.  She has not had any issues with presyncope or syncope.  She also has a history of hypertension, hyperlipidemia, GERD, diabetes, and HFpEF.  Last echo was performed in October 2018  and showed normal LV function with moderate LVH.  She lives with her nephew here in [place].  She walks most days the week for about 45 minutes.  She says she walks very slowly and typically covers about a half a mile at  that time.  She has been walking some this week and has not had any chest pain with activity.  Kristine Oliver was in her usual state of health until approximately 10 days ago.  Over the past 10 days, she has had 3 distinct episodes of left arm and left-sided chest pain that radiates across her chest, down both arms, and into her neck and jaw.  All episodes occurred at rest.  The first 2 episodes resolved after taking 2 sublingual nitroglycerin tablets and lasted under an hour.  On August 15 however, she had an episode that occurred in the late afternoon and lasted several hours.  She took a total of 4 sublingual nitroglycerin tablets.  Symptoms eventually resolved by early evening.  She had an uneventful night and this morning presented to our office as a walk-in.  Upon reporting symptoms, she was referred to the emergency department for further evaluation.  She has not had any recurrent chest pain today.  In the ED, twelve-lead is nonacute however troponin is elevated at 0.57.    Hospital Course     Consultants: None   1. NSTEMI in patient with CAD s/p prior NSTEMI with PCI/DES to LAD: patient presented with intermittent chest pain at rest for the past 10 days. Symptoms improved with nitro. She presented outpatient with these complaints and was referred to the ED for further evaluation. She was found to have elevated troponins with peak 0.58. EKG was non-ischemic. She underwent LHC 03/21/18 which revealed 90% stenosis of the mid circumflex managed with PCI/DES, as well as 50% stenosis of the ostial-proximal circumflex, 25% stenosis of the prox-mid RCA, 25% stenosis of the prox LAD, and patent mid LAD stent. Echo 03/19/18 revealed EF 60-65%, G1DD, no regional wall motion abnormalities, and no significant valvular abnormalities. She was recommended for DAPT with aspirin and plavix for a minimum of 12 months. Her crestor was increased to 20mg  daily. Her imdur was increased to 30mg  daily. - Continue aspirin and  plavix  - Continue high intensity statin - Continue imdur 30mg  daily  2. HTN: BP persistently elevated this admission. She was maintained on hydralazine 25mg  TID inpatient but concerns for compliance prompted transition to home ramipril 5mg  daily with the addition of amlodipine 5mg  daily and increased home imdur to 30mg  daily. - Continue ramipril and uptitrate outpatient as Cr/BP tolerate - Continue amlodipine and imdur - Consider adding spironolactone outpatient if Cr/BP tolerate  3. HLD: LDL 91 this admission. Crestor increased to 20mg  daily  - Continue high intensity statin  4. DM type 2: no recent A1C. On insulin  - Resumed home regimen at discharge  5. Chronic renal insufficiency: Cr monitored closely throughout admission and improved to 1.16 post LHC. Home ramipril resumed at discharge - Continue to monitor routinely outpatient.   _____________  Discharge Vitals Blood pressure (!) 148/72, pulse 63, temperature 98.1 F (36.7 C), temperature source Oral, resp. rate 13, height 4' 11.5" (1.511 m), weight 79.3 kg, SpO2 100 %.  Filed Weights   03/20/18 0604 03/21/18 0300 03/22/18 0304  Weight: 77.9 kg 77.9 kg 79.3 kg   Physical exam on the day of discharge:  GEN: Sitting upright on the edge of the hospital bed in no acute distress.  Neck: No JVD, no carotid bruits Cardiac: RRR, no murmurs, rubs, or gallops. Right radial cath site without significant ecchymosis or hematoma Respiratory: Clear to auscultation bilaterally, no wheezes/ rales/ rhonchi GI: NABS, Soft, nontender, non-distended  MS: No edema; No deformity. Neuro:  Nonfocal, moving all extremities spontaneously Psych: Normal affect     Labs & Radiologic Studies    CBC Recent Labs    03/21/18 0602 03/22/18 0244  WBC 8.2 6.6  HGB 12.2 11.4*  HCT 39.9 37.1  MCV 96.4 95.1  PLT 250 630   Basic Metabolic Panel Recent Labs    03/21/18 0602 03/22/18 0244  NA 139 140  K 5.0 4.3  CL 110 109  CO2 22 22    GLUCOSE 105* 112*  BUN 18 14  CREATININE 1.37* 1.16*  CALCIUM 10.7* 10.6*   Liver Function Tests No results for input(s): AST, ALT, ALKPHOS, BILITOT, PROT, ALBUMIN in the last 72 hours. No results for input(s): LIPASE, AMYLASE in the last 72 hours. Cardiac Enzymes No results for input(s): CKTOTAL, CKMB, CKMBINDEX, TROPONINI in the last 72 hours. BNP Invalid input(s): POCBNP D-Dimer No results for input(s): DDIMER in the last 72 hours. Hemoglobin A1C No results for input(s): HGBA1C in the last 72 hours. Fasting Lipid Panel No results for input(s): CHOL, HDL, LDLCALC, TRIG, CHOLHDL, LDLDIRECT in the last 72 hours. Thyroid Function Tests No results for input(s): TSH, T4TOTAL, T3FREE, THYROIDAB in the last 72 hours.  Invalid input(s): FREET3 _____________  Dg Chest 2 View  Result Date: 03/18/2018 CLINICAL DATA:  Chest pain EXAM: CHEST - 2 VIEW COMPARISON:  12/30/2017 FINDINGS: Borderline heart size. Long-standing right mediastinal convexity likely from the ascending aorta tortuosity. There is generalized interstitial coarsening that is chronic based on priors. No acute opacity. Spondylosis and exaggerated thoracolumbar kyphosis. IMPRESSION: 1. No acute finding. 2. Stable interstitial coarsening, suspect interstitial lung disease. Consider chest CT characterization. 3. Enlarged heart Electronically Signed   By: Monte Fantasia M.D.   On: 03/18/2018 12:54   Disposition   Patient was seen and examined by Dr. Harrell Gave who deemed patient as stable for discharge. Follow-up has been arranged. Discharge medications as listed below.   Follow-up Plans & Appointments    Follow-up Information    Jerline Pain, MD Follow up on 03/29/2018.   Specialty:  Cardiology Why:  Please arrive 15 minutes early for your 2:20pm post-hospital cardiology follow-up Contact information: [address] [place] [address] [phone]          Discharge Instructions    AMB  Referral to Cardiac Rehabilitation - Phase II   Complete by:  As directed    Diagnosis:   NSTEMI Coronary Stents     Diet - low sodium heart healthy   Complete by:  As directed    Increase activity slowly   Complete by:  As directed       Discharge Medications   Allergies as of 03/22/2018      Reactions   Pravastatin Other (See Comments)   Muscle aches   Prednisone Nausea And Vomiting      Medication List    TAKE these medications   ALPRAZolam 0.25 MG tablet Commonly known as:  XANAX Take 0.25 mg by mouth 3 (three) times daily as needed. anxiety   amLODipine 5 MG tablet Commonly known as:  NORVASC Take 1 tablet (5 mg total) by mouth daily. Start taking on:  03/23/2018   antiseptic oral rinse Liqd 15 mLs by Mouth Rinse  route as needed for dry mouth.   aspirin 81 MG tablet Take 81 mg by mouth daily.   clindamycin 1 % lotion Commonly known as:  CLEOCIN T Apply 1 application topically daily.   clopidogrel 75 MG tablet Commonly known as:  PLAVIX Take 1 tablet (75 mg total) by mouth daily. Start taking on:  03/23/2018 What changed:  when to take this   cycloSPORINE 0.05 % ophthalmic emulsion Commonly known as:  RESTASIS Place 1 drop into both eyes as needed.   estrogen-methylTESTOSTERone 0.625-1.25 MG tablet Take 1 tablet by mouth daily.   furosemide 20 MG tablet Commonly known as:  LASIX Take 1 tablet (20 mg total) by mouth daily as needed. Do not take more than 1 tablet within a 24 hour period.   gabapentin 300 MG capsule Commonly known as:  NEURONTIN Take 300 mg by mouth 2 (two) times daily.   isosorbide mononitrate 30 MG 24 hr tablet Commonly known as:  IMDUR Take 1 tablet (30 mg total) by mouth daily. Start taking on:  03/23/2018 What changed:  how much to take   levothyroxine 75 MCG tablet Commonly known as:  SYNTHROID, LEVOTHROID Take 75 mcg by mouth daily.   metFORMIN 500 MG tablet Commonly known as:  GLUCOPHAGE Take 1 tablet by mouth 2 (two)  times daily.   nitroGLYCERIN 0.4 MG SL tablet Commonly known as:  NITROSTAT Place 1 tablet (0.4 mg total) under the tongue every 5 (five) minutes x 3 doses as needed for chest pain.   oxyCODONE 5 MG immediate release tablet Commonly known as:  Oxy IR/ROXICODONE Take 5 mg by mouth at bedtime.   Oxycodone HCl 10 MG Tabs Take 1 tablet by mouth 4 (four) times daily.   pantoprazole 40 MG tablet Commonly known as:  PROTONIX Take 1 tablet (40 mg total) by mouth daily. Start taking on:  03/23/2018   PREMARIN vaginal cream Generic drug:  conjugated estrogens Place 1 Applicatorful vaginally once a week.   ramipril 5 MG capsule Commonly known as:  ALTACE Take 1 capsule (5 mg total) by mouth daily. What changed:    medication strength  how much to take   rosuvastatin 20 MG tablet Commonly known as:  CRESTOR Take 1 tablet (20 mg total) by mouth daily at 6 PM.   valACYclovir 500 MG tablet Commonly known as:  VALTREX Take 500 mg by mouth 2 (two) times daily.   vitamin B-12 1000 MCG tablet Commonly known as:  CYANOCOBALAMIN Take 1,000 mcg by mouth daily.   zolpidem 10 MG tablet Commonly known as:  AMBIEN Take 10 mg by mouth at bedtime as needed.        Aspirin prescribed at discharge?  Yes High Intensity Statin Prescribed? (Lipitor 40-80mg  or Crestor 20-40mg ): Yes Beta Blocker Prescribed? No: bradycardic at baseline For EF <40%, was ACEI/ARB Prescribed? Yes ADP Receptor Inhibitor Prescribed? (i.e. Plavix etc.-Includes Medically Managed Patients): Yes For EF <40%, Aldosterone Inhibitor Prescribed? No: EF >40% Was EF assessed during THIS hospitalization? Yes Was Cardiac Rehab II ordered? (Included Medically managed Patients): Yes   Outstanding Labs/Studies   Continue to titrate BP medications outpatient  Duration of Discharge Encounter   Greater than 30 minutes including physician time.  Signed, Abigail Butts PA-C 03/22/2018, 10:12 AM

## 2018-03-22 NOTE — Progress Notes (Signed)
CARDIAC REHAB PHASE I   PRE:  Rate/Rhythm: 54 SR with missed beats  BP:  Sitting: 167/133      SaO2: 98 RA  MODE:  Ambulation: 500 ft   POST:  Rate/Rhythm: 97 Afib  BP:  Sitting: 136/115    SaO2: 97 RA  Pt ambulated 532ft in hallway standby assist with rollator. Pt had slight SOB upon return to room, but denied CP. Pt educated on importance of Plavix, ASA, and NTG. Stent card and MI book at bedside. Pt given heart healthy and diabetic diets. Reviewed restrictions and exercise guidelines. Will refer to CRP II [place].  [id number] Rufina Falco, RN BSN 03/22/2018 10:28 AM

## 2018-03-23 ENCOUNTER — Other Ambulatory Visit: Payer: Self-pay

## 2018-03-23 ENCOUNTER — Telehealth (HOSPITAL_COMMUNITY): Payer: Self-pay

## 2018-03-23 ENCOUNTER — Emergency Department (HOSPITAL_COMMUNITY)
Admission: EM | Admit: 2018-03-23 | Discharge: 2018-03-23 | Disposition: A | Discharge status: Home / Self Care | Payer: Medicare Other | Attending: Emergency Medicine | Admitting: Emergency Medicine

## 2018-03-23 ENCOUNTER — Encounter (HOSPITAL_COMMUNITY): Payer: Self-pay | Admitting: Emergency Medicine

## 2018-03-23 DIAGNOSIS — K5900 Constipation, unspecified: Secondary | ICD-10-CM | POA: Diagnosis present

## 2018-03-23 DIAGNOSIS — Z79899 Other long term (current) drug therapy: Secondary | ICD-10-CM | POA: Insufficient documentation

## 2018-03-23 DIAGNOSIS — I130 Hypertensive heart and chronic kidney disease with heart failure and stage 1 through stage 4 chronic kidney disease, or unspecified chronic kidney disease: Secondary | ICD-10-CM | POA: Insufficient documentation

## 2018-03-23 DIAGNOSIS — E039 Hypothyroidism, unspecified: Secondary | ICD-10-CM | POA: Diagnosis not present

## 2018-03-23 DIAGNOSIS — Z7982 Long term (current) use of aspirin: Secondary | ICD-10-CM | POA: Insufficient documentation

## 2018-03-23 DIAGNOSIS — Z87891 Personal history of nicotine dependence: Secondary | ICD-10-CM | POA: Diagnosis not present

## 2018-03-23 DIAGNOSIS — E119 Type 2 diabetes mellitus without complications: Secondary | ICD-10-CM | POA: Insufficient documentation

## 2018-03-23 DIAGNOSIS — N183 Chronic kidney disease, stage 3 (moderate): Secondary | ICD-10-CM | POA: Diagnosis not present

## 2018-03-23 DIAGNOSIS — Z7984 Long term (current) use of oral hypoglycemic drugs: Secondary | ICD-10-CM | POA: Diagnosis not present

## 2018-03-23 DIAGNOSIS — I5032 Chronic diastolic (congestive) heart failure: Secondary | ICD-10-CM | POA: Insufficient documentation

## 2018-03-23 MED ORDER — MILK AND MOLASSES ENEMA
1.0000 | Freq: Once | RECTAL | Status: AC
  Administered 2018-03-23: 250 mL via RECTAL
  Filled 2018-03-23 (×2): qty 250

## 2018-03-23 MED ORDER — ALPRAZOLAM 0.25 MG PO TABS
0.2500 mg | ORAL_TABLET | Freq: Once | ORAL | Status: AC
  Administered 2018-03-23: 0.25 mg via ORAL
  Filled 2018-03-23: qty 1

## 2018-03-23 MED ORDER — MAGNESIUM CITRATE PO SOLN
1.0000 | Freq: Once | ORAL | Status: AC
  Administered 2018-03-23: 1 via ORAL
  Filled 2018-03-23: qty 296

## 2018-03-23 MED ORDER — OXYCODONE HCL 5 MG PO TABS
10.0000 mg | ORAL_TABLET | Freq: Once | ORAL | Status: AC
  Administered 2018-03-23: 10 mg via ORAL
  Filled 2018-03-23: qty 2

## 2018-03-23 NOTE — Discharge Instructions (Addendum)
Take colace and miralax daily as prescribed on bottle for constipation.

## 2018-03-23 NOTE — ED Provider Notes (Signed)
[facility] EMERGENCY DEPARTMENT Provider Note   CSN: [id number] Arrival date & time: 03/23/18  0138     History   Chief Complaint Chief Complaint  Patient presents with  . Constipation    Fecal Impaction     HPI Kristine Oliver is a 84 y.o. female.  84 yo female with past medical history including CAD, bipolar disorder, chronic pain, type 2 diabetes mellitus, CKD, hypertension who presents with constipation.  The patient was admitted to the hospital last week for NSTEMI, had stent placed.  She has had constipation since her hospitalization, last bowel movement was approximately 4 to 5 days ago.  She has tried suppositories and a fleets enema at home, passed a very small amount of hard stool but feels impacted.  She does not want to strain because of her heart.  She took a dose of Colace earlier today.  The history is provided by the patient.  Constipation      Past Medical History:  Diagnosis Date  . Bipolar affective disorder ([place])   . Bradycardia   . CAD (coronary artery disease)    a. 08/2004 NSTEMI/PCI: LAD 26m/d (2.75 x 28 mm Taxus 2 DES);  b. 10/2010 Cath: LM nl, LAD patent stents w/ jailed septal, LCX 30, RCA 30; c. 06/2013 MV: No ischemia. Small fixed anteroseptal defect, ? scar vs atten. EF 62%; d. 03/2016 MV: EF 57%, Low risk.  . Chronic diastolic heart failure ([place])    a. Echo 7/13:  mod LVH, EF 65-70%, vigorous LV, Gr 1 diast dysfn, mild LAE; b. 05/2017 Echo: EF 60-65%, mod LVH, no rwma, Gr1 DD, mildly dil LA.  Marland Kitchen Chronic pain disorder   . CKD (chronic kidney disease), stage III ([place])   . DDD (degenerative disc disease), lumbosacral    , Spinal stenosis  . Diabetes mellitus ([place])    , Type II  . GERD (gastroesophageal reflux disease)   . HTN (hypertension)   . Hyperlipidemia   . Hyperthyroidism   . IBS (irritable bowel syndrome)   . Lumbar disc disease   . Nonproliferative diabetic retinopathy associated with type 2 diabetes mellitus ([place])     . Obesity (BMI 30-39.9)   . Peripheral neuropathy    , Associated with DM  . Scoliosis     Patient Active Problem List   Diagnosis Date Noted  . Status post coronary artery stent placement   . NSTEMI (non-ST elevated myocardial infarction) ([place]) 03/18/2018  . Essential hypertension 05/19/2016  . Chest pain 05/18/2016  . Bradycardia 09/26/2014  . Sinus bradycardia 10/30/2013  . Fatigue 10/30/2013  . Acute on chronic diastolic congestive heart failure ([place]) 06/26/2013  . CAD (coronary artery disease) 02/12/2012  . Hypercalcemia 02/12/2012  . Chronic pain disorder   . Hypothyroidism   . Type 2 diabetes mellitus with vascular disease ([place])   . Hyperlipidemia   . Lumbar disc disease   . Obesity (BMI 30-39.9)   . Hypertensive heart disease without CHF   . Bipolar affective disorder ([place])   . Scoliosis     Past Surgical History:  Procedure Laterality Date  . BACK SURGERY    . CARDIAC CATHETERIZATION    . CHOLECYSTECTOMY    . CORONARY STENT INTERVENTION N/A 03/21/2018   Procedure: CORONARY STENT INTERVENTION;  Surgeon: Jettie Booze, MD;  Location: [facility];  Service: Cardiovascular;  Laterality: N/A;  . DILATION AND CURETTAGE OF UTERUS    . HEMILAMINOTOMY LUMBAR SPINE  2002  Rt L5, with decompression and foraminotomy  . INDUCED ABORTION     , x2  . LAMINOTOMY Right   . LEFT HEART CATH AND CORONARY ANGIOGRAPHY N/A 03/21/2018   Procedure: LEFT HEART CATH AND CORONARY ANGIOGRAPHY;  Surgeon: Jettie Booze, MD;  Location: [facility];  Service: Cardiovascular;  Laterality: N/A;  . LUMBAR [place] SURGERY  07/1999   L5-S1  . PILONIDAL CYST EXCISION    . ROTATOR CUFF REPAIR    . TONSILLECTOMY    . TOTAL ABDOMINAL HYSTERECTOMY W/ BILATERAL SALPINGOOPHORECTOMY       OB History   None      Home Medications    Prior to Admission medications   Medication Sig Start Date End Date Taking? Authorizing Provider  ALPRAZolam (XANAX) 0.25 MG tablet Take  0.25 mg by mouth 3 (three) times daily as needed. anxiety    [provider]  amLODipine (NORVASC) 5 MG tablet Take 1 tablet (5 mg total) by mouth daily. 03/23/18   Kroeger, Lorelee Cover., PA-C  antiseptic oral rinse (BIOTENE) LIQD 15 mLs by Mouth Rinse route as needed for dry mouth.    [provider]  aspirin 81 MG tablet Take 81 mg by mouth daily.    [provider]  clindamycin (CLEOCIN T) 1 % lotion Apply 1 application topically daily. 11/16/13   [provider]  clopidogrel (PLAVIX) 75 MG tablet Take 1 tablet (75 mg total) by mouth daily. 03/23/18   Kroeger, Lorelee Cover., PA-C  cycloSPORINE (RESTASIS) 0.05 % ophthalmic emulsion Place 1 drop into both eyes as needed.     [provider]  estrogen-methylTESTOSTERone 0.625-1.25 MG per tablet Take 1 tablet by mouth daily.    [provider]  furosemide (LASIX) 20 MG tablet Take 1 tablet (20 mg total) by mouth daily as needed. Do not take more than 1 tablet within a 24 hour period. 05/06/17 03/18/18  Daune Perch, NP  gabapentin (NEURONTIN) 300 MG capsule Take 300 mg by mouth 2 (two) times daily.     [provider]  isosorbide mononitrate (IMDUR) 30 MG 24 hr tablet Take 1 tablet (30 mg total) by mouth daily. 03/23/18   Kroeger, Lorelee Cover., PA-C  levothyroxine (SYNTHROID, LEVOTHROID) 75 MCG tablet Take 75 mcg by mouth daily.    [provider]  metFORMIN (GLUCOPHAGE) 500 MG tablet Take 1 tablet by mouth 2 (two) times daily. 03/17/16   [provider]  nitroGLYCERIN (NITROSTAT) 0.4 MG SL tablet Place 1 tablet (0.4 mg total) under the tongue every 5 (five) minutes x 3 doses as needed for chest pain. 03/22/18   Kroeger, Lorelee Cover., PA-C  oxyCODONE (OXY IR/ROXICODONE) 5 MG immediate release tablet Take 5 mg by mouth at bedtime.    [provider]  Oxycodone HCl 10 MG TABS Take 1 tablet by mouth 4 (four) times daily. 05/22/15   [provider]  pantoprazole (PROTONIX) 40  MG tablet Take 1 tablet (40 mg total) by mouth daily. 03/23/18   Kroeger, Lorelee Cover., PA-C  PREMARIN vaginal cream Place 1 Applicatorful vaginally once a week.  10/06/13   [provider]  ramipril (ALTACE) 5 MG capsule Take 1 capsule (5 mg total) by mouth daily. 03/22/18   Kroeger, Lorelee Cover., PA-C  rosuvastatin (CRESTOR) 20 MG tablet Take 1 tablet (20 mg total) by mouth daily at 6 PM. 03/22/18   Kroeger, Lorelee Cover., PA-C  valACYclovir (VALTREX) 500 MG tablet Take 500 mg by mouth 2 (two) times  daily.     [provider]  vitamin B-12 (CYANOCOBALAMIN) 1000 MCG tablet Take 1,000 mcg by mouth daily.    [provider]  zolpidem (AMBIEN) 10 MG tablet Take 10 mg by mouth at bedtime as needed.     [provider]    Family History Family History  Problem Relation Age of Onset  . Heart failure Father   . Diabetes Father   . Coronary artery disease Mother     Social History Social History   Tobacco Use  . Smoking status: Former Smoker    Years: 35.00  . Smokeless tobacco: Never Used  Substance Use Topics  . Alcohol use: No    Comment: social drinking, not often  . Drug use: No     Allergies   Pravastatin and Prednisone   Review of Systems Review of Systems  Gastrointestinal: Positive for constipation. Negative for vomiting.  All other systems reviewed and are negative.    Physical Exam Updated Vital Signs BP (!) 142/55   Pulse 70   Temp 98.1 F (36.7 C) (Oral)   Resp 18   SpO2 98%   Physical Exam  Constitutional: She is oriented to person, place, and time. She appears well-developed and well-nourished. No distress.  HENT:  Head: Normocephalic and atraumatic.  Eyes: Conjunctivae are normal.  Neck: Neck supple.  Abdominal: Soft. Bowel sounds are normal. She exhibits no distension. There is no tenderness.  Genitourinary:  Genitourinary Comments: No stool in rectal vault  Neurological: She is alert and oriented to person, place, and time.    Skin: Skin is warm and dry.  Psychiatric: She has a normal mood and affect.  Nursing note and vitals reviewed. Chaperone was present during exam.    ED Treatments / Results  Labs (all labs ordered are listed, but only abnormal results are displayed) Labs Reviewed - No data to display  EKG None  Radiology No results found.  Procedures Procedures (including critical care time)  Medications Ordered in ED Medications  milk and molasses enema (250 mLs Rectal Given 03/23/18 0515)  magnesium citrate solution 1 Bottle (1 Bottle Oral Given 03/23/18 0513)  oxyCODONE (Oxy IR/ROXICODONE) immediate release tablet 10 mg (10 mg Oral Given 03/23/18 0511)  ALPRAZolam Duanne Moron) tablet 0.25 mg (0.25 mg Oral Given 03/23/18 0511)     Initial Impression / Assessment and Plan / ED Course  I have reviewed the triage vital signs and the nursing notes.     No stool in vault on DRE. Gave milk and molasses enema and magnesium citrate. PT had small amount of output in ED and based on no stool on rectal exam, I feel she will be adequately cleaned out after mag citrate. Discussed constipation management at home with MiraLAX and Colace. Pt voiced understanding.   Final Clinical Impressions(s) / ED Diagnoses   Final diagnoses:  Constipation, unspecified constipation type    ED Discharge Orders    None       Little, Wenda Overland, MD 03/23/18 [phone]

## 2018-03-23 NOTE — ED Notes (Addendum)
Pt has a small pieces of stool when pt went to bathroom pt unsure if she still feel impacted will notify edp.

## 2018-03-23 NOTE — Telephone Encounter (Signed)
Pt insurance is active and benefits verified through [facility]. Co-pay $20.00, DED $0.00/$0.00 met, out of pocket $3,300.00/$55.00 met, co-insurance 0%. No pre-authorization. Passport, 03/23/18 @ 8:57AM, [id number]  Will contact patient to see if she is interested in the Cardiac Rehab Program. If interested, patient will need to complete follow up appt. Once completed, patient will be contacted for scheduling upon review by the RN Navigator.

## 2018-03-23 NOTE — ED Triage Notes (Addendum)
Patient reports constipation/fecal impaction unrelieved by stool softener/laxatives , last BM last week , recent coronary stent placement 03/21/18 .Hypertensive at triage .

## 2018-03-29 ENCOUNTER — Encounter: Payer: Self-pay | Admitting: Cardiology

## 2018-03-29 ENCOUNTER — Ambulatory Visit: Payer: Medicare Other | Admitting: Cardiology

## 2018-03-29 VITALS — BP 112/60 | HR 60 | Ht 59.5 in | Wt 174.8 lb

## 2018-03-29 DIAGNOSIS — I5032 Chronic diastolic (congestive) heart failure: Secondary | ICD-10-CM | POA: Diagnosis not present

## 2018-03-29 DIAGNOSIS — E669 Obesity, unspecified: Secondary | ICD-10-CM | POA: Diagnosis not present

## 2018-03-29 DIAGNOSIS — I2510 Atherosclerotic heart disease of native coronary artery without angina pectoris: Secondary | ICD-10-CM | POA: Diagnosis not present

## 2018-03-29 NOTE — Patient Instructions (Signed)
Medication Instructions:  The current medical regimen is effective;  continue present plan and medications.  Follow-Up: Follow up in 4 months with Truitt Merle, NP.  If you need a refill on your cardiac medications before your next appointment, please call your pharmacy.  Thank you for choosing [facility]!!

## 2018-03-29 NOTE — Progress Notes (Signed)
Kristine Oliver. [address], [place]  [address] Phone: [phone] Fax:  [phone]  Date:  03/29/2018   ID:  Kristine, Oliver 08-11-1930, MRN [id number]  PCP:  Lavone Orn, MD   History of Present Illness: Kristine Oliver is a 84 y.o. female with diastolic heart failure, old myocardial infarction in 2006 treated with DES to LAD with followup catheterization in March of 2012 showing patent LAD stent with jailed septal, 30% circumflex, 30% RCA, normal EF, chronic kidney disease stage III with creatinine of 1.3, hypertension, obesity, hyperlipidemia with last nuclear stress test 06/27/13 negative for ischemia, EF 62% here for followup.  Her main complaint today was fatigue. Previously it was insomnia. She is having trouble sleeping through the night. If she gets good rest, she feels quite well for the most part through the day. If she does not get good rest, she states that occasionally she may have an anginal episode. Nitroglycerin always seems to help. She takes half of an Ambien and half of the Xanax.  She was fairly bradycardic despite stopping amlodipine and she wore a 48-hour Holter monitor which demonstrated chronotropic incompetence as well as sinus pauses with average heart rate of 57 beats per minute ranging from 46-80. She had symptomatic sinus bradycardia and was requested to have pacemaker placed but she canceled her procedure.  She still having mild dyspnea on exertion but this is not severe. No syncope. She will occasionally have "anginal symptoms ". She is very happy about having her rolling walker that she sit on. Walks in the park.   Used to lift 45 pound free weights.   She knows she does not have sleep apnea (never had sleep study). She is very proud about her knowledge base. She admitted that she was eating salty potato chips with for proximal 1 month and gained 5 pounds. She was having some swelling in her legs.  03/29/2018- here for hospital follow-up,  discharged on 03/22/2018 after non-ST elevation myocardial infarction.  She is continuing with aspirin and Plavix.  She had previously declined pacemaker.  No beta-blocker.  High intensity statin.  Increased isosorbide.  Radial cath site.  Stent placement to mid circumflex lesion.  She is doing very well.  No issues.  No fevers chills nausea vomiting.  Radial site excellent.  Wt Readings from Last 3 Encounters:  03/29/18 174 lb 12.8 oz (79.3 kg)  03/22/18 174 lb 13.2 oz (79.3 kg)  05/06/17 171 lb (77.6 kg)     Past Medical History:  Diagnosis Date  . Bipolar affective disorder ([place])   . Bradycardia   . CAD (coronary artery disease)    a. 08/2004 NSTEMI/PCI: LAD 27m/d (2.75 x 28 mm Taxus 2 DES);  b. 10/2010 Cath: LM nl, LAD patent stents w/ jailed septal, LCX 30, RCA 30; c. 06/2013 MV: No ischemia. Small fixed anteroseptal defect, ? scar vs atten. EF 62%; d. 03/2016 MV: EF 57%, Low risk.  . Chronic diastolic heart failure ([place])    a. Echo 7/13:  mod LVH, EF 65-70%, vigorous LV, Gr 1 diast dysfn, mild LAE; b. 05/2017 Echo: EF 60-65%, mod LVH, no rwma, Gr1 DD, mildly dil LA.  Marland Kitchen Chronic pain disorder   . CKD (chronic kidney disease), stage III ([place])   . DDD (degenerative disc disease), lumbosacral    , Spinal stenosis  . Diabetes mellitus ([place])    , Type II  . GERD (gastroesophageal reflux disease)   .  HTN (hypertension)   . Hyperlipidemia   . Hyperthyroidism   . IBS (irritable bowel syndrome)   . Lumbar disc disease   . Nonproliferative diabetic retinopathy associated with type 2 diabetes mellitus ([place])   . Obesity (BMI 30-39.9)   . Peripheral neuropathy    , Associated with DM  . Scoliosis     Past Surgical History:  Procedure Laterality Date  . BACK SURGERY    . CARDIAC CATHETERIZATION    . CHOLECYSTECTOMY    . CORONARY STENT INTERVENTION N/A 03/21/2018   Procedure: CORONARY STENT INTERVENTION;  Surgeon: Jettie Booze, MD;  Location: [facility];  Service:  Cardiovascular;  Laterality: N/A;  . DILATION AND CURETTAGE OF UTERUS    . HEMILAMINOTOMY LUMBAR SPINE  2002   Rt L5, with decompression and foraminotomy  . INDUCED ABORTION     , x2  . LAMINOTOMY Right   . LEFT HEART CATH AND CORONARY ANGIOGRAPHY N/A 03/21/2018   Procedure: LEFT HEART CATH AND CORONARY ANGIOGRAPHY;  Surgeon: Jettie Booze, MD;  Location: [facility];  Service: Cardiovascular;  Laterality: N/A;  . LUMBAR [place] SURGERY  07/1999   L5-S1  . PILONIDAL CYST EXCISION    . ROTATOR CUFF REPAIR    . TONSILLECTOMY    . TOTAL ABDOMINAL HYSTERECTOMY W/ BILATERAL SALPINGOOPHORECTOMY      Current Outpatient Medications  Medication Sig Dispense Refill  . ALPRAZolam (XANAX) 0.25 MG tablet Take 0.25 mg by mouth 3 (three) times daily as needed. anxiety    . amLODipine (NORVASC) 5 MG tablet Take 1 tablet (5 mg total) by mouth daily. 30 tablet 3  . antiseptic oral rinse (BIOTENE) LIQD 15 mLs by Mouth Rinse route as needed for dry mouth.    Marland Kitchen aspirin 81 MG tablet Take 81 mg by mouth daily.    . clindamycin (CLEOCIN T) 1 % lotion Apply 1 application topically daily.    . clopidogrel (PLAVIX) 75 MG tablet Take 1 tablet (75 mg total) by mouth daily. 90 tablet 3  . cycloSPORINE (RESTASIS) 0.05 % ophthalmic emulsion Place 1 drop into both eyes as needed.     Marland Kitchen estrogen-methylTESTOSTERone 0.625-1.25 MG per tablet Take 1 tablet by mouth daily.    Marland Kitchen gabapentin (NEURONTIN) 300 MG capsule Take 300 mg by mouth 2 (two) times daily.     . isosorbide mononitrate (IMDUR) 30 MG 24 hr tablet Take 1 tablet (30 mg total) by mouth daily. 30 tablet 3  . levothyroxine (SYNTHROID, LEVOTHROID) 75 MCG tablet Take 75 mcg by mouth daily.    . metFORMIN (GLUCOPHAGE) 500 MG tablet Take 1 tablet by mouth 2 (two) times daily.  0  . nitroGLYCERIN (NITROSTAT) 0.4 MG SL tablet Place 1 tablet (0.4 mg total) under the tongue every 5 (five) minutes x 3 doses as needed for chest pain. 25 tablet 3  . oxyCODONE (OXY  IR/ROXICODONE) 5 MG immediate release tablet Take 5 mg by mouth at bedtime.    . Oxycodone HCl 10 MG TABS Take 1 tablet by mouth 4 (four) times daily.  0  . pantoprazole (PROTONIX) 40 MG tablet Take 1 tablet (40 mg total) by mouth daily. 90 tablet 3  . PREMARIN vaginal cream Place 1 Applicatorful vaginally once a week.     . ramipril (ALTACE) 5 MG capsule Take 1 capsule (5 mg total) by mouth daily. 30 capsule 3  . rosuvastatin (CRESTOR) 20 MG tablet Take 1 tablet (20 mg total) by mouth daily at  6 PM. 90 tablet 3  . valACYclovir (VALTREX) 500 MG tablet Take 500 mg by mouth 2 (two) times daily.     . vitamin B-12 (CYANOCOBALAMIN) 1000 MCG tablet Take 1,000 mcg by mouth daily.    Marland Kitchen zolpidem (AMBIEN) 10 MG tablet Take 10 mg by mouth at bedtime as needed.     . furosemide (LASIX) 20 MG tablet Take 1 tablet (20 mg total) by mouth daily as needed. Do not take more than 1 tablet within a 24 hour period. 30 tablet 3   No current facility-administered medications for this visit.     Allergies:    Allergies  Allergen Reactions  . Pravastatin Other (See Comments)    Muscle aches  . Prednisone Nausea And Vomiting    Social History:  The patient  reports that she has quit smoking. She quit after 35.00 years of use. She has never used smokeless tobacco. She reports that she does not drink alcohol or use drugs.   Family History  Problem Relation Age of Onset  . Heart failure Father   . Diabetes Father   . Coronary artery disease Mother     ROS:  Please see the history of present illness.     PHYSICAL EXAM: VS:  BP 112/60   Pulse 60   Ht 4' 11.5" (1.511 m)   Wt 174 lb 12.8 oz (79.3 kg)   SpO2 97%   BMI 34.71 kg/m  GEN: Well nourished, well developed, in no acute distress  HEENT: normal  Neck: no JVD, carotid bruits, or masses Cardiac: RRR; no murmurs, rubs, or gallops,no edema  Respiratory:  clear to auscultation bilaterally, normal work of breathing GI: soft, nontender, nondistended, +  BS MS: no deformity or atrophy  Skin: warm and dry, no rash Neuro:  Alert and Oriented x 3, Strength and sensation are intact Psych: euthymic mood, full affect   EKG: EKG was ordered today 11/25/15-sinus bradycardia rate 56 with first-degree AV block EKG personally viewed Previous sinus bradycardia 50s  Echocardiogram 03/19/18: Study Conclusions  - Left ventricle: The cavity size was normal. There was mild concentric hypertrophy. Systolic function was normal. The estimated ejection fraction was in the range of 60% to 65%. Wall motion was normal; there were no regional wall motion abnormalities. Doppler parameters are consistent with abnormal left ventricular relaxation (grade 1 diastolic dysfunction). Doppler parameters are consistent with elevated ventricular end-diastolic filling pressure. - Aortic valve: There was trivial regurgitation. - Mitral valve: There was mild regurgitation. - Left atrium: The atrium was mildly dilated. - Right ventricle: Systolic function was normal. - Tricuspid valve: There was mild regurgitation. - Pulmonic valve: There was no regurgitation. - Pulmonary arteries: Systolic pressure was within the normal range. - Inferior vena cava: The vessel was normal in size. - Pericardium, extracardiac: There was no pericardial effusion.   Left heart catheterization 03/21/18:  Prox RCA to Mid RCA lesion is 25% stenosed.  Mid Cx lesion is 90% stenosed.  A drug-eluting stent was successfully placed using a STENT SIERRA 2.75 X 28 MM.  Post intervention, there is a 0% residual stenosis.  Ost Cx to Prox Cx lesion is 50% stenosed.  Previously placed Mid LAD stent (unknown type) is widely patent.  Prox LAD lesion is 25% stenosed.  LV end diastolic pressure is mildly elevated. LVEDP 17 mm Hg.  There is no aortic valve stenosis.  Vasospasm and discomfort in the right arm from the radial approach.  Recommend uninterrupted dual antiplatelet  therapy with  Aspirin 81mg  daily and Clopidogrel 75mg  daily for a minimum of 12 months (ACS - Class I recommendation).  Patient was already on Plavix. COuld consdier Brilinta instead if bleeding risk is low.   Continue aggressive secondary prevention.  _____________    ASSESSMENT AND PLAN:  1. Hypotension-occasionally will feel some hypotension.  She is going to hold the amlodipine. 2. Sinus bradycardia-opted not to go forward with pacemaker. Avoiding all AV nodal blocking agents. Appreciate Dr. Rayann Heman consultation. Seems stable, no syncopal symptoms. Mild first-degree AV block 246 ms. 3. NSTEMI/CAD-DES to circ. Plavix and ASA in setting of NSTEMI. DES to LAD. Catheterization in 2012-patent stent. Nuclear stress test 11/14, no ischemia. Normal ejection fraction.  4. Angina-doing very well. 5. Obesity-continue to encourage weight loss. 6. 6 month follow up.  Signed, [name], MD [facility]  03/29/2018 4:35 PM

## 2018-04-01 ENCOUNTER — Telehealth (HOSPITAL_COMMUNITY): Payer: Self-pay

## 2018-04-01 NOTE — Telephone Encounter (Signed)
Attempted to contact patient in regards to Cardiac Rehab - lm on vm

## 2018-04-08 ENCOUNTER — Telehealth (HOSPITAL_COMMUNITY): Payer: Self-pay

## 2018-04-08 ENCOUNTER — Encounter (HOSPITAL_COMMUNITY): Payer: Self-pay

## 2018-04-08 NOTE — Telephone Encounter (Signed)
2nd attempt contact patient in regards to Cardiac Rehab - lm on vm. Sending letter.

## 2018-04-15 ENCOUNTER — Telehealth (HOSPITAL_COMMUNITY): Payer: Self-pay

## 2018-04-15 NOTE — Telephone Encounter (Signed)
3rd attempt to contact patient in regards to Cardiac Rehab - lm on vm

## 2018-04-20 ENCOUNTER — Telehealth (HOSPITAL_COMMUNITY): Payer: Self-pay

## 2018-04-20 NOTE — Telephone Encounter (Signed)
Patient returned phone call in regards to Cardiac Rehab - Scheduled orientation on 06/14/18 @ 1:30pm. Patient will attend the 2:45pm exc class. Mailed packet.

## 2018-04-22 ENCOUNTER — Emergency Department (HOSPITAL_COMMUNITY): Payer: Medicare Other

## 2018-04-22 ENCOUNTER — Encounter (HOSPITAL_COMMUNITY): Payer: Self-pay

## 2018-04-22 ENCOUNTER — Emergency Department (HOSPITAL_COMMUNITY)
Admission: EM | Admit: 2018-04-22 | Discharge: 2018-04-22 | Disposition: A | Discharge status: Home / Self Care | Payer: Medicare Other | Attending: Emergency Medicine | Admitting: Emergency Medicine

## 2018-04-22 DIAGNOSIS — I2510 Atherosclerotic heart disease of native coronary artery without angina pectoris: Secondary | ICD-10-CM | POA: Diagnosis not present

## 2018-04-22 DIAGNOSIS — I5032 Chronic diastolic (congestive) heart failure: Secondary | ICD-10-CM | POA: Diagnosis not present

## 2018-04-22 DIAGNOSIS — E119 Type 2 diabetes mellitus without complications: Secondary | ICD-10-CM | POA: Diagnosis not present

## 2018-04-22 DIAGNOSIS — Z79899 Other long term (current) drug therapy: Secondary | ICD-10-CM | POA: Insufficient documentation

## 2018-04-22 DIAGNOSIS — I130 Hypertensive heart and chronic kidney disease with heart failure and stage 1 through stage 4 chronic kidney disease, or unspecified chronic kidney disease: Secondary | ICD-10-CM | POA: Insufficient documentation

## 2018-04-22 DIAGNOSIS — Z7984 Long term (current) use of oral hypoglycemic drugs: Secondary | ICD-10-CM | POA: Diagnosis not present

## 2018-04-22 DIAGNOSIS — Z7901 Long term (current) use of anticoagulants: Secondary | ICD-10-CM | POA: Insufficient documentation

## 2018-04-22 DIAGNOSIS — N183 Chronic kidney disease, stage 3 (moderate): Secondary | ICD-10-CM | POA: Diagnosis not present

## 2018-04-22 DIAGNOSIS — R0789 Other chest pain: Secondary | ICD-10-CM | POA: Insufficient documentation

## 2018-04-22 DIAGNOSIS — Z87891 Personal history of nicotine dependence: Secondary | ICD-10-CM | POA: Insufficient documentation

## 2018-04-22 DIAGNOSIS — R079 Chest pain, unspecified: Secondary | ICD-10-CM

## 2018-04-22 LAB — BASIC METABOLIC PANEL
ANION GAP: 8 (ref 5–15)
BUN: 17 mg/dL (ref 8–23)
CO2: 21 mmol/L — ABNORMAL LOW (ref 22–32)
Calcium: 10.9 mg/dL — ABNORMAL HIGH (ref 8.9–10.3)
Chloride: 109 mmol/L (ref 98–111)
Creatinine, Ser: 1.26 mg/dL — ABNORMAL HIGH (ref 0.44–1.00)
GFR calc Af Amer: 44 mL/min — ABNORMAL LOW (ref >60)
GFR, EST NON AFRICAN AMERICAN: 38 mL/min — AB (ref >60)
GLUCOSE: 161 mg/dL — AB (ref 70–99)
POTASSIUM: 4.3 mmol/L (ref 3.5–5.1)
Sodium: 138 mmol/L (ref 135–145)

## 2018-04-22 LAB — CBC
HEMATOCRIT: 36.3 % (ref 36.0–46.0)
HEMOGLOBIN: 11.2 g/dL — AB (ref 12.0–15.0)
MCH: 29.9 pg (ref 26.0–34.0)
MCHC: 30.9 g/dL (ref 30.0–36.0)
MCV: 97.1 fL (ref 78.0–100.0)
Platelets: 228 10*3/uL (ref 150–400)
RBC: 3.74 MIL/uL — ABNORMAL LOW (ref 3.87–5.11)
RDW: 15.9 % — ABNORMAL HIGH (ref 11.5–15.5)
WBC: 9.1 10*3/uL (ref 4.0–10.5)

## 2018-04-22 LAB — I-STAT TROPONIN, ED
Troponin i, poc: 0.02 ng/mL (ref 0.00–0.08)
Troponin i, poc: 0.03 ng/mL (ref 0.00–0.08)

## 2018-04-22 MED ORDER — CLOPIDOGREL BISULFATE 75 MG PO TABS
75.0000 mg | ORAL_TABLET | Freq: Once | ORAL | Status: AC
  Administered 2018-04-22: 75 mg via ORAL
  Filled 2018-04-22: qty 1

## 2018-04-22 MED ORDER — OXYCODONE HCL 5 MG PO TABS
10.0000 mg | ORAL_TABLET | Freq: Once | ORAL | Status: AC
  Administered 2018-04-22: 10 mg via ORAL
  Filled 2018-04-22: qty 2

## 2018-04-22 NOTE — Discharge Instructions (Addendum)
Remember to take your medications as prescribed.  Get rechecked immediately if you have any new or concerning symptoms.

## 2018-04-22 NOTE — ED Notes (Signed)
Attempted to draw labs x 2 w/o success.  Phlebotomy at bedside.

## 2018-04-22 NOTE — ED Notes (Signed)
Patient transported to X-ray 

## 2018-04-22 NOTE — ED Triage Notes (Signed)
Pt from home via EMS; MI x 2 weeks ago, stent placed, discharged w/ in past week; c/o pain across chest raidiating to neck; CP started around 1700; pt took 4 nitro at home, 2 given w/ ems; improvement w/ 6th nitro; 6/10- 2/10; sinus w/ ems; Hx Dm, HTN, MI  144/44 98% RA R 66 RR 16

## 2018-04-22 NOTE — ED Provider Notes (Signed)
[facility] EMERGENCY DEPARTMENT Provider Note   CSN: [id number] Arrival date & time: 04/22/18  1847     History   Chief Complaint Chief Complaint  Patient presents with  . Chest Pain    HPI Kristine Oliver is a 84 y.o. female.  The history is provided by the patient. No language interpreter was used.   Kristine Oliver is a 84 y.o. female who presents to the Emergency Department complaining of chest pain.  She presents for evaluation of chest pain that began after going shopping.  Pain was across her entire chest and described as pressure sensation radiating to the neck.  She had associated nausea. One month ago she was treated for an nSTEMI with a stent placement. She states that since that time she has been compliant with her medications but thinks she may have forgotten them today. She is not hundred percent sure if she forgot them. She took for nitro at home. The nitroglycerin would temporarily improve her pain and that it would worsen. 911 was called due to ongoing pain for about an hour. She did deceive additional nitroglycerin with EMS and now she is completely pain free at this time. She denies any fevers, cough, vomiting, leg swelling or pain. Past Medical History:  Diagnosis Date  . Bipolar affective disorder ([place])   . Bradycardia   . CAD (coronary artery disease)    a. 08/2004 NSTEMI/PCI: LAD 73m/d (2.75 x 28 mm Taxus 2 DES);  b. 10/2010 Cath: LM nl, LAD patent stents w/ jailed septal, LCX 30, RCA 30; c. 06/2013 MV: No ischemia. Small fixed anteroseptal defect, ? scar vs atten. EF 62%; d. 03/2016 MV: EF 57%, Low risk.  . Chronic diastolic heart failure ([place])    a. Echo 7/13:  mod LVH, EF 65-70%, vigorous LV, Gr 1 diast dysfn, mild LAE; b. 05/2017 Echo: EF 60-65%, mod LVH, no rwma, Gr1 DD, mildly dil LA.  Marland Kitchen Chronic pain disorder   . CKD (chronic kidney disease), stage III ([place])   . DDD (degenerative disc disease), lumbosacral    , Spinal stenosis  . Diabetes  mellitus ([place])    , Type II  . GERD (gastroesophageal reflux disease)   . HTN (hypertension)   . Hyperlipidemia   . Hyperthyroidism   . IBS (irritable bowel syndrome)   . Lumbar disc disease   . Nonproliferative diabetic retinopathy associated with type 2 diabetes mellitus ([place])   . Obesity (BMI 30-39.9)   . Peripheral neuropathy    , Associated with DM  . Scoliosis     Patient Active Problem List   Diagnosis Date Noted  . Status post coronary artery stent placement   . NSTEMI (non-ST elevated myocardial infarction) ([place]) 03/18/2018  . Essential hypertension 05/19/2016  . Chest pain 05/18/2016  . Bradycardia 09/26/2014  . Sinus bradycardia 10/30/2013  . Fatigue 10/30/2013  . Acute on chronic diastolic congestive heart failure ([place]) 06/26/2013  . CAD (coronary artery disease) 02/12/2012  . Hypercalcemia 02/12/2012  . Chronic pain disorder   . Hypothyroidism   . Type 2 diabetes mellitus with vascular disease ([place])   . Hyperlipidemia   . Lumbar disc disease   . Obesity (BMI 30-39.9)   . Hypertensive heart disease without CHF   . Bipolar affective disorder ([place])   . Scoliosis     Past Surgical History:  Procedure Laterality Date  . BACK SURGERY    . CARDIAC CATHETERIZATION    . CHOLECYSTECTOMY    .  CORONARY STENT INTERVENTION N/A 03/21/2018   Procedure: CORONARY STENT INTERVENTION;  Surgeon: Jettie Booze, MD;  Location: [facility];  Service: Cardiovascular;  Laterality: N/A;  . DILATION AND CURETTAGE OF UTERUS    . HEMILAMINOTOMY LUMBAR SPINE  2002   Rt L5, with decompression and foraminotomy  . INDUCED ABORTION     , x2  . LAMINOTOMY Right   . LEFT HEART CATH AND CORONARY ANGIOGRAPHY N/A 03/21/2018   Procedure: LEFT HEART CATH AND CORONARY ANGIOGRAPHY;  Surgeon: Jettie Booze, MD;  Location: [facility];  Service: Cardiovascular;  Laterality: N/A;  . LUMBAR [place] SURGERY  07/1999   L5-S1  . PILONIDAL CYST EXCISION    . ROTATOR CUFF  REPAIR    . TONSILLECTOMY    . TOTAL ABDOMINAL HYSTERECTOMY W/ BILATERAL SALPINGOOPHORECTOMY       OB History   None      Home Medications    Prior to Admission medications   Medication Sig Start Date End Date Taking? Authorizing Provider  ALPRAZolam (XANAX) 0.25 MG tablet Take 0.25 mg by mouth 3 (three) times daily as needed. anxiety    [provider]  amLODipine (NORVASC) 5 MG tablet Take 1 tablet (5 mg total) by mouth daily. 03/23/18   Kroeger, Lorelee Cover., PA-C  antiseptic oral rinse (BIOTENE) LIQD 15 mLs by Mouth Rinse route as needed for dry mouth.    [provider]  aspirin 81 MG tablet Take 81 mg by mouth daily.    [provider]  clindamycin (CLEOCIN T) 1 % lotion Apply 1 application topically daily. 11/16/13   [provider]  clopidogrel (PLAVIX) 75 MG tablet Take 1 tablet (75 mg total) by mouth daily. 03/23/18   Kroeger, Lorelee Cover., PA-C  cycloSPORINE (RESTASIS) 0.05 % ophthalmic emulsion Place 1 drop into both eyes as needed.     [provider]  estrogen-methylTESTOSTERone 0.625-1.25 MG per tablet Take 1 tablet by mouth daily.    [provider]  furosemide (LASIX) 20 MG tablet Take 1 tablet (20 mg total) by mouth daily as needed. Do not take more than 1 tablet within a 24 hour period. 05/06/17 03/18/18  Daune Perch, NP  gabapentin (NEURONTIN) 300 MG capsule Take 300 mg by mouth 2 (two) times daily.     [provider]  isosorbide mononitrate (IMDUR) 30 MG 24 hr tablet Take 1 tablet (30 mg total) by mouth daily. 03/23/18   Kroeger, Lorelee Cover., PA-C  levothyroxine (SYNTHROID, LEVOTHROID) 75 MCG tablet Take 75 mcg by mouth daily.    [provider]  metFORMIN (GLUCOPHAGE) 500 MG tablet Take 1 tablet by mouth 2 (two) times daily. 03/17/16   [provider]  nitroGLYCERIN (NITROSTAT) 0.4 MG SL tablet Place 1 tablet (0.4 mg total) under the tongue every 5 (five) minutes x 3 doses as needed for chest  pain. 03/22/18   Kroeger, Lorelee Cover., PA-C  oxyCODONE (OXY IR/ROXICODONE) 5 MG immediate release tablet Take 5 mg by mouth at bedtime.    [provider]  Oxycodone HCl 10 MG TABS Take 1 tablet by mouth 4 (four) times daily. 05/22/15   [provider]  pantoprazole (PROTONIX) 40 MG tablet Take 1 tablet (40 mg total) by mouth daily. 03/23/18   Kroeger, Lorelee Cover., PA-C  PREMARIN vaginal cream Place 1 Applicatorful vaginally once a week.  10/06/13   [provider]  ramipril (ALTACE) 5 MG capsule Take 1 capsule (5 mg total) by mouth  daily. 03/22/18   Kroeger, Lorelee Cover., PA-C  rosuvastatin (CRESTOR) 20 MG tablet Take 1 tablet (20 mg total) by mouth daily at 6 PM. 03/22/18   Kroeger, Lorelee Cover., PA-C  valACYclovir (VALTREX) 500 MG tablet Take 500 mg by mouth 2 (two) times daily.     [provider]  vitamin B-12 (CYANOCOBALAMIN) 1000 MCG tablet Take 1,000 mcg by mouth daily.    [provider]  zolpidem (AMBIEN) 10 MG tablet Take 10 mg by mouth at bedtime as needed.     [provider]    Family History Family History  Problem Relation Age of Onset  . Heart failure Father   . Diabetes Father   . Coronary artery disease Mother     Social History Social History   Tobacco Use  . Smoking status: Former Smoker    Years: 35.00  . Smokeless tobacco: Never Used  Substance Use Topics  . Alcohol use: No    Comment: social drinking, not often  . Drug use: No     Allergies   Pravastatin and Prednisone   Review of Systems Review of Systems  All other systems reviewed and are negative.    Physical Exam Updated Vital Signs BP (!) 173/63 (BP Location: Right Arm)   Pulse (!) 56   Temp 97.6 F (36.4 C) (Oral)   Resp 16   SpO2 96%   Physical Exam  Constitutional: She is oriented to person, place, and time. She appears well-developed and well-nourished.  HENT:  Head: Normocephalic and atraumatic.  Cardiovascular: Normal rate and regular  rhythm.  No murmur heard. Pulmonary/Chest: Effort normal and breath sounds normal. No respiratory distress.  Abdominal: Soft. There is no tenderness. There is no rebound and no guarding.  Musculoskeletal: She exhibits no edema or tenderness.  Neurological: She is alert and oriented to person, place, and time.  Skin: Skin is warm and dry.  Psychiatric: She has a normal mood and affect. Her behavior is normal.  Nursing note and vitals reviewed.    ED Treatments / Results  Labs (all labs ordered are listed, but only abnormal results are displayed) Labs Reviewed  BASIC METABOLIC PANEL - Abnormal; Notable for the following components:      Result Value   CO2 21 (*)    Glucose, Bld 161 (*)    Creatinine, Ser 1.26 (*)    Calcium 10.9 (*)    GFR calc non Af Amer 38 (*)    GFR calc Af Amer 44 (*)    All other components within normal limits  CBC - Abnormal; Notable for the following components:   RBC 3.74 (*)    Hemoglobin 11.2 (*)    RDW 15.9 (*)    All other components within normal limits  I-STAT TROPONIN, ED  I-STAT TROPONIN, ED    EKG EKG Interpretation  Date/Time:  Friday April 22 2018 18:54:53 EDT Ventricular Rate:  61 PR Interval:    QRS Duration: 88 QT Interval:  411 QTC Calculation: 414 R Axis:   8 Text Interpretation:  Sinus rhythm Prolonged PR interval Borderline T abnormalities, lateral leads Confirmed by Quintella Reichert [phone]) on 04/22/2018 7:37:00 PM   Radiology Dg Chest 2 View  Result Date: 04/22/2018 CLINICAL DATA:  Recent myocardial infarction, now with chest pain. EXAM: CHEST - 2 VIEW COMPARISON:  03/18/2018. FINDINGS: The heart is enlarged. Coronary stent projects over the LEFT heart. There is mild vascular congestion. No consolidation or edema. No effusion or pneumothorax. Skeletal osteopenia  with increased kyphosis. IMPRESSION: Cardiomegaly. Mild vascular congestion. No frank edema or consolidation. Electronically Signed   By: Staci Righter M.D.    On: 04/22/2018 19:42    Procedures Procedures (including critical care time)  Medications Ordered in ED Medications  clopidogrel (PLAVIX) tablet 75 mg (75 mg Oral Given 04/22/18 2337)  oxyCODONE (Oxy IR/ROXICODONE) immediate release tablet 10 mg (10 mg Oral Given 04/22/18 2337)     Initial Impression / Assessment and Plan / ED Course  I have reviewed the triage vital signs and the nursing notes.  Pertinent labs & imaging results that were available during my care of the patient were reviewed by me and considered in my medical decision making (see chart for details).     Patient with drug-eluting stent placed to the left circumflex one month ago here for evaluation of episode of chest pain that lasted about one hour. She thinks she forgot to take her aspirin, Plavix and M nurse today. She did take aspirin prior to ED arrival. She is pain free on ED arrival and throughout her ED evaluation. EKG is without acute ischemic changes. Troponin is negative times two. Plan to discharge home with reminders to take her daily medicines. Discussed importance of cardiology follow-up as well as return precautions.  Final Clinical Impressions(s) / ED Diagnoses   Final diagnoses:  Nonspecific chest pain    ED Discharge Orders    None       Quintella Reichert, MD 04/23/18 [phone]

## 2018-04-27 ENCOUNTER — Telehealth: Payer: Self-pay | Admitting: Cardiology

## 2018-04-27 ENCOUNTER — Other Ambulatory Visit: Payer: Medicare Other | Admitting: *Deleted

## 2018-04-27 ENCOUNTER — Encounter (INDEPENDENT_AMBULATORY_CARE_PROVIDER_SITE_OTHER): Payer: Self-pay

## 2018-04-27 DIAGNOSIS — R0602 Shortness of breath: Secondary | ICD-10-CM

## 2018-04-27 LAB — D-DIMER, QUANTITATIVE: D-DIMER: 0.55 mg/L FEU — ABNORMAL HIGH (ref 0.00–0.49)

## 2018-04-27 NOTE — Telephone Encounter (Signed)
Spoke with pt today who has c/o SOB. She is audibly SOB over the phone as well. Pt states she has not been able to catch her breath all night and could not sleep. This has made her nervous. Pt denies peripheral edema and weight gain. Pt states she has actually lost 3lbs in the last 2 days. She also states she has been compliant with her DAPT, not skipping a dose since her last visit in the ED. Pt denies CP.  I suggested to pt she be evaluated by the ED for this SOB. Pt has declined and stated she "had something she has to do today."  Situation brought to DOD, Dr Johnsie Cancel. We will have pt come into office today for a STAT D dimer and BNP to evaluate for fluid overload and possible PE. Pt understands if D Dimer is positive, she may have to visit the ED for further evaluation. Pt has agreed with a follow up visit with Dr Marlou Porch on 9/20 to eval her increased SOB.

## 2018-04-27 NOTE — Telephone Encounter (Signed)
New message  Pt c/o Shortness Of Breath: STAT if SOB developed within the last 24 hours or pt is noticeably SOB on the phone  1. Are you currently SOB (can you hear that pt is SOB on the phone)? Yes  2. How long have you been experiencing SOB? Within the last 24 hours   3. Are you SOB when sitting or when up moving around? Sitting   4. Are you currently experiencing any other symptoms? No

## 2018-04-28 LAB — PRO B NATRIURETIC PEPTIDE: NT-Pro BNP: 2300 pg/mL — ABNORMAL HIGH (ref 0–738)

## 2018-05-02 ENCOUNTER — Ambulatory Visit: Payer: Medicare Other | Admitting: Cardiology

## 2018-05-02 ENCOUNTER — Encounter: Payer: Self-pay | Admitting: Cardiology

## 2018-05-02 VITALS — BP 110/80 | HR 60 | Ht 59.5 in | Wt 174.4 lb

## 2018-05-02 DIAGNOSIS — I2510 Atherosclerotic heart disease of native coronary artery without angina pectoris: Secondary | ICD-10-CM

## 2018-05-02 DIAGNOSIS — I25119 Atherosclerotic heart disease of native coronary artery with unspecified angina pectoris: Secondary | ICD-10-CM | POA: Diagnosis not present

## 2018-05-02 DIAGNOSIS — I10 Essential (primary) hypertension: Secondary | ICD-10-CM

## 2018-05-02 DIAGNOSIS — R001 Bradycardia, unspecified: Secondary | ICD-10-CM

## 2018-05-02 DIAGNOSIS — E119 Type 2 diabetes mellitus without complications: Secondary | ICD-10-CM

## 2018-05-02 DIAGNOSIS — I209 Angina pectoris, unspecified: Secondary | ICD-10-CM

## 2018-05-02 NOTE — Patient Instructions (Signed)
Medication Instructions:  The current medical regimen is effective;  continue present plan and medications.  Follow-Up: Follow up in 6 months with Laura Ingold, NP.  You will receive a letter in the mail 2 months before you are due.  Please call us when you receive this letter to schedule your follow up appointment.  Follow up in 1 year with Dr. Skains.  You will receive a letter in the mail 2 months before you are due.  Please call us when you receive this letter to schedule your follow up appointment.  If you need a refill on your cardiac medications before your next appointment, please call your pharmacy.  Thank you for choosing [facility]!!     

## 2018-05-02 NOTE — Progress Notes (Signed)
Cardiology Office Note:    Date:  05/02/2018   ID:  Kristine Oliver, DOB 09-Aug-1930, MRN [id number]  PCP:  Lavone Orn, MD  Cardiologist:  [name], MD  Electrophysiologist:  None   Referring MD: Lavone Orn, MD     History of Present Illness:    Kristine Oliver is a 83 y.o. female here for follow-up of coronary artery disease status post catheterization 03/21/2018, DES placed to mid circumflex lesion, dual antiplatelet therapy with Plavix and aspirin with normal ejection fraction.  821/19 went into the emergency department with constipation and fecal impaction.  On 04/22/2018 went into the emergency room with chest pain.  EKG showed no acute changes.  Troponin was negative.  She states that occasionally she will feel short of breath, main complaint though is fatigue, tiredness.  She has been walking more in [employer] and this seems to be helping.  She lives in [place] with her nephew who is disabled, age 32.  She enjoys watching the deer out of her backyard.   Past Medical History:  Diagnosis Date  . Bipolar affective disorder ([place])   . Bradycardia   . CAD (coronary artery disease)    a. 08/2004 NSTEMI/PCI: LAD 83m/d (2.75 x 28 mm Taxus 2 DES);  b. 10/2010 Cath: LM nl, LAD patent stents w/ jailed septal, LCX 30, RCA 30; c. 06/2013 MV: No ischemia. Small fixed anteroseptal defect, ? scar vs atten. EF 62%; d. 03/2016 MV: EF 57%, Low risk.  . Chronic diastolic heart failure ([place])    a. Echo 7/13:  mod LVH, EF 65-70%, vigorous LV, Gr 1 diast dysfn, mild LAE; b. 05/2017 Echo: EF 60-65%, mod LVH, no rwma, Gr1 DD, mildly dil LA.  Marland Kitchen Chronic pain disorder   . CKD (chronic kidney disease), stage III ([place])   . DDD (degenerative disc disease), lumbosacral    , Spinal stenosis  . Diabetes mellitus ([place])    , Type II  . GERD (gastroesophageal reflux disease)   . HTN (hypertension)   . Hyperlipidemia   . Hyperthyroidism   . IBS (irritable bowel syndrome)   . Lumbar disc  disease   . Nonproliferative diabetic retinopathy associated with type 2 diabetes mellitus ([place])   . Obesity (BMI 30-39.9)   . Peripheral neuropathy    , Associated with DM  . Scoliosis     Past Surgical History:  Procedure Laterality Date  . BACK SURGERY    . CARDIAC CATHETERIZATION    . CHOLECYSTECTOMY    . CORONARY STENT INTERVENTION N/A 03/21/2018   Procedure: CORONARY STENT INTERVENTION;  Surgeon: Jettie Booze, MD;  Location: [facility];  Service: Cardiovascular;  Laterality: N/A;  . DILATION AND CURETTAGE OF UTERUS    . HEMILAMINOTOMY LUMBAR SPINE  2002   Rt L5, with decompression and foraminotomy  . INDUCED ABORTION     , x2  . LAMINOTOMY Right   . LEFT HEART CATH AND CORONARY ANGIOGRAPHY N/A 03/21/2018   Procedure: LEFT HEART CATH AND CORONARY ANGIOGRAPHY;  Surgeon: Jettie Booze, MD;  Location: [facility];  Service: Cardiovascular;  Laterality: N/A;  . LUMBAR [place] SURGERY  07/1999   L5-S1  . PILONIDAL CYST EXCISION    . ROTATOR CUFF REPAIR    . TONSILLECTOMY    . TOTAL ABDOMINAL HYSTERECTOMY W/ BILATERAL SALPINGOOPHORECTOMY      Current Medications: Current Meds  Medication Sig  . ALPRAZolam (XANAX) 0.25 MG tablet Take 0.25 mg by mouth 3 (  three) times daily as needed. anxiety  . amLODipine (NORVASC) 5 MG tablet Take 1 tablet (5 mg total) by mouth daily.  Marland Kitchen antiseptic oral rinse (BIOTENE) LIQD 15 mLs by Mouth Rinse route as needed for dry mouth.  Marland Kitchen aspirin 81 MG tablet Take 81 mg by mouth daily.  . clindamycin (CLEOCIN T) 1 % lotion Apply 1 application topically daily.  . clopidogrel (PLAVIX) 75 MG tablet Take 1 tablet (75 mg total) by mouth daily.  . cycloSPORINE (RESTASIS) 0.05 % ophthalmic emulsion Place 1 drop into both eyes as needed.   Marland Kitchen estrogen-methylTESTOSTERone 0.625-1.25 MG per tablet Take 1 tablet by mouth daily.  Marland Kitchen gabapentin (NEURONTIN) 300 MG capsule Take 300 mg by mouth 2 (two) times daily.   . isosorbide mononitrate  (IMDUR) 30 MG 24 hr tablet Take 1 tablet (30 mg total) by mouth daily.  Marland Kitchen levothyroxine (SYNTHROID, LEVOTHROID) 75 MCG tablet Take 75 mcg by mouth daily.  . metFORMIN (GLUCOPHAGE) 500 MG tablet Take 1 tablet by mouth 2 (two) times daily.  . nitroGLYCERIN (NITROSTAT) 0.4 MG SL tablet Place 1 tablet (0.4 mg total) under the tongue every 5 (five) minutes x 3 doses as needed for chest pain.  Marland Kitchen oxyCODONE (OXY IR/ROXICODONE) 5 MG immediate release tablet Take 5 mg by mouth at bedtime.  . Oxycodone HCl 10 MG TABS Take 1 tablet by mouth 4 (four) times daily.  . pantoprazole (PROTONIX) 40 MG tablet Take 1 tablet (40 mg total) by mouth daily.  Marland Kitchen PREMARIN vaginal cream Place 1 Applicatorful vaginally once a week.   . ramipril (ALTACE) 5 MG capsule Take 1 capsule (5 mg total) by mouth daily.  . rosuvastatin (CRESTOR) 20 MG tablet Take 1 tablet (20 mg total) by mouth daily at 6 PM.  . valACYclovir (VALTREX) 500 MG tablet Take 500 mg by mouth 2 (two) times daily.   . vitamin B-12 (CYANOCOBALAMIN) 1000 MCG tablet Take 1,000 mcg by mouth daily.  Marland Kitchen zolpidem (AMBIEN) 10 MG tablet Take 10 mg by mouth at bedtime as needed.      Allergies:   Pravastatin and Prednisone   Social History   Socioeconomic History  . Marital status: Divorced    Spouse name: Not on file  . Number of children: Not on file  . Years of education: Not on file  . Highest education level: Not on file  Occupational History  . Occupation: retired  Scientific laboratory technician  . Financial resource strain: Not on file  . Food insecurity:    Worry: Not on file    Inability: Not on file  . Transportation needs:    Medical: Not on file    Non-medical: Not on file  Tobacco Use  . Smoking status: Former Smoker    Years: 35.00  . Smokeless tobacco: Never Used  Substance and Sexual Activity  . Alcohol use: No    Comment: social drinking, not often  . Drug use: No  . Sexual activity: Not on file  Lifestyle  . Physical activity:    Days per week:  Not on file    Minutes per session: Not on file  . Stress: Not on file  Relationships  . Social connections:    Talks on phone: Not on file    Gets together: Not on file    Attends religious service: Not on file    Active member of club or organization: Not on file    Attends meetings of clubs or organizations: Not on file  Relationship status: Not on file  Other Topics Concern  . Not on file  Social History Narrative   Divorced.  Lives with her nephew in [place].  She walks most days of the week.     Family History: The patient's family history includes Coronary artery disease in her mother; Diabetes in her father; Heart failure in her father.  ROS:   Please see the history of present illness.    Back pain, shortness of breath, occasional chest pain, fatigue, balance issues.  All other systems reviewed and are negative.  EKGs/Labs/Other Studies Reviewed:    The following studies were reviewed today: Hospital notes catheterization EKG reviewed   EKG: No new  Recent Labs: 04/22/2018: BUN 17; Creatinine, Ser 1.26; Hemoglobin 11.2; Platelets 228; Potassium 4.3; Sodium 138 04/27/2018: NT-Pro BNP 2,300  Recent Lipid Panel    Component Value Date/Time   CHOL 142 03/19/2018 0423   TRIG 103 03/19/2018 0423   HDL 30 (L) 03/19/2018 0423   CHOLHDL 4.7 03/19/2018 0423   VLDL 21 03/19/2018 0423   LDLCALC 91 03/19/2018 0423    Physical Exam:    VS:  BP 110/80   Pulse 60   Ht 4' 11.5" (1.511 m)   Wt 174 lb 6.4 oz (79.1 kg)   BMI 34.64 kg/m     Wt Readings from Last 3 Encounters:  05/02/18 174 lb 6.4 oz (79.1 kg)  03/29/18 174 lb 12.8 oz (79.3 kg)  03/22/18 174 lb 13.2 oz (79.3 kg)     GEN:  Well nourished, well developed in no acute distress, uses walker HEENT: Normal NECK: No JVD; No carotid bruits LYMPHATICS: No lymphadenopathy CARDIAC: RRR, no murmurs, rubs, gallops RESPIRATORY:  Clear to auscultation without rales, wheezing or rhonchi  ABDOMEN: Soft,  non-tender, non-distended MUSCULOSKELETAL:  No edema; No deformity  SKIN: Warm and dry NEUROLOGIC:  Alert and oriented x 3 PSYCHIATRIC:  Normal affect   ASSESSMENT:    1. Coronary artery disease involving native coronary artery of native heart without angina pectoris   2. Sinus bradycardia   3. Angina pectoris ([place])   4. Diabetes mellitus with coincident hypertension (HCC)    PLAN:    In order of problems listed above:  Coronary artery disease/prior non-ST elevation myocardial infarction - Continue with dual antiplatelet therapy, aspirin and Plavix.  03/22/2019 will be 1 year. -Imdur to help with angina. -No beta-blocker because of excessive fatigue - Statin therapy, Crestor. -Continue with walking.  Sinus bradycardia - Has seen Dr. Rayann Heman in the past for consultation for pacemaker but she has not had any syncopal symptoms, mild first-degree AV block, avoiding AV nodal blocking agents normally for fatigue but because of this.  Did not wish to go forward with pacemaker.  Diabetes with hypertension - Metformin.  Doing well. -Norvasc  6 months with Mickel Baas 1 year with me   Medication Adjustments/Labs and Tests Ordered: Current medicines are reviewed at length with the patient today.  Concerns regarding medicines are outlined above.  No orders of the defined types were placed in this encounter.  No orders of the defined types were placed in this encounter.   Patient Instructions  Medication Instructions:  The current medical regimen is effective;  continue present plan and medications.  Follow-Up: Follow up in 6 months with Cecilie Kicks, NP.  You will receive a letter in the mail 2 months before you are due.  Please call us when you receive this letter to schedule your follow up appointment.  Follow  up in 1 year with Dr. Marlou Porch.  You will receive a letter in the mail 2 months before you are due.  Please call us when you receive this letter to schedule your follow up  appointment.  If you need a refill on your cardiac medications before your next appointment, please call your pharmacy.  Thank you for choosing [facility]!!        Signed, [name], MD  05/02/2018 5:05 PM    [place]

## 2018-05-03 ENCOUNTER — Telehealth: Payer: Self-pay

## 2018-05-03 MED ORDER — FUROSEMIDE 20 MG PO TABS: 20.0000 mg | ORAL_TABLET | Freq: Every day | ORAL | 3 refills | Status: DC

## 2018-05-03 NOTE — Telephone Encounter (Signed)
-----   Message from Jerline Pain, MD sent at 04/28/2018  6:20 PM EDT ----- Agree, lets take lasix 20mg  PO QD.  [name], MD

## 2018-05-03 NOTE — Telephone Encounter (Signed)
Placed order for Lasix 20 mg by mouth daily.

## 2018-06-08 ENCOUNTER — Telehealth (HOSPITAL_COMMUNITY): Payer: Self-pay | Admitting: Pharmacist

## 2018-06-08 NOTE — Telephone Encounter (Signed)
Cardiac Rehab Medication Review by a Pharmacist  Does the patient  feel that his/her medications are working for him/her?  yes  Has the patient been experiencing any side effects to the medications prescribed?  no   Does the patient measure his/her own blood pressure or blood glucose at home?  yes   Does the patient have any problems obtaining medications due to transportation or finances?   no  Understanding of regimen: good Understanding of indications: good Potential of compliance: good    Pharmacist comments: Patient reports she does occasionally checks her blood pressure at home. She reports it normally runs between 130-140/60-70   Gwenlyn Found, [place] D PGY1 Pharmacy Resident  Phone [phone] 06/08/2018   10:38 AM

## 2018-06-13 NOTE — Progress Notes (Signed)
Kristine Oliver 84 y.o. female DOB 18-Nov-1930 MRN [id number]       Nutrition  No diagnosis found. Past Medical History:  Diagnosis Date  . Bipolar affective disorder ([place])   . Bradycardia   . CAD (coronary artery disease)    a. 08/2004 NSTEMI/PCI: LAD 25m/d (2.75 x 28 mm Taxus 2 DES);  b. 10/2010 Cath: LM nl, LAD patent stents w/ jailed septal, LCX 30, RCA 30; c. 06/2013 MV: No ischemia. Small fixed anteroseptal defect, ? scar vs atten. EF 62%; d. 03/2016 MV: EF 57%, Low risk.  . Chronic diastolic heart failure ([place])    a. Echo 7/13:  mod LVH, EF 65-70%, vigorous LV, Gr 1 diast dysfn, mild LAE; b. 05/2017 Echo: EF 60-65%, mod LVH, no rwma, Gr1 DD, mildly dil LA.  Marland Kitchen Chronic pain disorder   . CKD (chronic kidney disease), stage III ([place])   . DDD (degenerative disc disease), lumbosacral    , Spinal stenosis  . Diabetes mellitus ([place])    , Type II  . GERD (gastroesophageal reflux disease)   . HTN (hypertension)   . Hyperlipidemia   . Hyperthyroidism   . IBS (irritable bowel syndrome)   . Lumbar disc disease   . Nonproliferative diabetic retinopathy associated with type 2 diabetes mellitus ([place])   . Obesity (BMI 30-39.9)   . Peripheral neuropathy    , Associated with DM  . Scoliosis    Meds reviewed.    Current Outpatient Medications (Endocrine & Metabolic):  .  estrogen-methylTESTOSTERone 0.625-1.25 MG per tablet, Take 1 tablet by mouth daily. Marland Kitchen  levothyroxine (SYNTHROID, LEVOTHROID) 75 MCG tablet, Take 75 mcg by mouth daily. .  metFORMIN (GLUCOPHAGE) 500 MG tablet, Take 1 tablet by mouth 2 (two) times daily.  Current Outpatient Medications (Cardiovascular):  .  amLODipine (NORVASC) 5 MG tablet, Take 1 tablet (5 mg total) by mouth daily. .  furosemide (LASIX) 20 MG tablet, Take 1 tablet (20 mg total) by mouth daily. .  isosorbide mononitrate (IMDUR) 30 MG 24 hr tablet, Take 1 tablet (30 mg total) by mouth daily. .  nitroGLYCERIN (NITROSTAT) 0.4 MG SL tablet, Place 1 tablet (0.4 mg  total) under the tongue every 5 (five) minutes x 3 doses as needed for chest pain. .  ramipril (ALTACE) 5 MG capsule, Take 1 capsule (5 mg total) by mouth daily. .  rosuvastatin (CRESTOR) 20 MG tablet, Take 1 tablet (20 mg total) by mouth daily at 6 PM.   Current Outpatient Medications (Analgesics):  .  aspirin 81 MG tablet, Take 81 mg by mouth daily. Marland Kitchen  oxyCODONE (OXY IR/ROXICODONE) 5 MG immediate release tablet, Take 5 mg by mouth at bedtime. .  Oxycodone HCl 10 MG TABS, Take 1 tablet by mouth 4 (four) times daily.  Current Outpatient Medications (Hematological):  .  clopidogrel (PLAVIX) 75 MG tablet, Take 1 tablet (75 mg total) by mouth daily. .  vitamin B-12 (CYANOCOBALAMIN) 1000 MCG tablet, Take 1,000 mcg by mouth daily.  Current Outpatient Medications (Other):  Marland Kitchen  ALPRAZolam (XANAX) 0.25 MG tablet, Take 0.25 mg by mouth 3 (three) times daily as needed. anxiety .  antiseptic oral rinse (BIOTENE) LIQD, 15 mLs by Mouth Rinse route as needed for dry mouth. .  clindamycin (CLEOCIN T) 1 % lotion, Apply 1 application topically daily. .  cycloSPORINE (RESTASIS) 0.05 % ophthalmic emulsion, Place 1 drop into both eyes as needed.  .  gabapentin (NEURONTIN) 300 MG capsule, Take 300 mg by mouth 2 (two) times daily.  Marland Kitchen  pantoprazole (PROTONIX) 40 MG tablet, Take 1 tablet (40 mg total) by mouth daily. Marland Kitchen  PREMARIN vaginal cream, Place 1 Applicatorful vaginally once a week.  .  valACYclovir (VALTREX) 500 MG tablet, Take 500 mg by mouth 2 (two) times daily.  Marland Kitchen  zolpidem (AMBIEN) 10 MG tablet, Take 10 mg by mouth at bedtime as needed.    HT: Ht Readings from Last 1 Encounters:  05/02/18 4' 11.5" (1.511 m)    WT: Wt Readings from Last 5 Encounters:  05/02/18 174 lb 6.4 oz (79.1 kg)  03/29/18 174 lb 12.8 oz (79.3 kg)  03/22/18 174 lb 13.2 oz (79.3 kg)  05/06/17 171 lb (77.6 kg)  05/19/16 169 lb 11.2 oz (77 kg)     There is no height or weight on file to calculate BMI.   Current tobacco use?  No       Labs:  Lipid Panel     Component Value Date/Time   CHOL 142 03/19/2018 0423   TRIG 103 03/19/2018 0423   HDL 30 (L) 03/19/2018 0423   CHOLHDL 4.7 03/19/2018 0423   VLDL 21 03/19/2018 0423   LDLCALC 91 03/19/2018 0423    Lab Results  Component Value Date   HGBA1C 6.7 (H) 02/13/2012   CBG (last 3)  No results for input(s): GLUCAP in the last 72 hours.  Nutrition Diagnosis ? Food-and nutrition-related knowledge deficit related to lack of exposure to information as related to diagnosis of: ? CVD ? Type 2 Diabetes ? Obese  I = 30-34.9 related to excessive energy intake as evidenced by a BMI 34.65 (05/02/18)  Nutrition Goal(s):  ? To be determined  Plan:  Pt to attend nutrition classes ? Nutrition I ? Nutrition II ? Portion Distortion  ? Diabetes Blitz ? Diabetes Q & A Will provide client-centered nutrition education as part of interdisciplinary care.   Monitor and evaluate progress toward nutrition goal with team.  Laurina Bustle, MS, RD, LDN 06/13/2018 10:54 AM

## 2018-06-14 ENCOUNTER — Encounter (HOSPITAL_COMMUNITY)
Admission: RE | Admit: 2018-06-14 | Discharge: 2018-06-14 | Disposition: A | Discharge status: Home / Self Care | Payer: Medicare Other | Source: Ambulatory Visit | Attending: Cardiology | Admitting: Cardiology

## 2018-06-14 ENCOUNTER — Encounter (HOSPITAL_COMMUNITY): Payer: Self-pay

## 2018-06-14 VITALS — Ht 59.0 in | Wt 175.9 lb

## 2018-06-14 DIAGNOSIS — Z955 Presence of coronary angioplasty implant and graft: Secondary | ICD-10-CM | POA: Diagnosis not present

## 2018-06-14 DIAGNOSIS — I214 Non-ST elevation (NSTEMI) myocardial infarction: Secondary | ICD-10-CM | POA: Diagnosis not present

## 2018-06-14 NOTE — Progress Notes (Signed)
Cardiac Individual Treatment Plan  Patient Details  Name: Kristine Oliver MRN: [id number] Date of Birth: Feb 05, 1931 Referring Provider:   Flowsheet Row CARDIAC REHAB PHASE II ORIENTATION from 06/14/2018 in [facility]  Referring Provider  Dr. Marlou Porch      Initial Encounter Date:  Flowsheet Row CARDIAC REHAB PHASE II ORIENTATION from 06/14/2018 in [place]  Date  06/14/18      Visit Diagnosis: 08/16/2019NSTEMI (non-ST elevated myocardial infarction) [facility])  08/16/19Stented coronary artery  Patient's Home Medications on Admission:  Current Outpatient Medications:  .  ALPRAZolam (XANAX) 0.25 MG tablet, Take 0.25 mg by mouth 3 (three) times daily as needed. anxiety, Disp: , Rfl:  .  amLODipine (NORVASC) 5 MG tablet, Take 1 tablet (5 mg total) by mouth daily., Disp: 30 tablet, Rfl: 3 .  antiseptic oral rinse (BIOTENE) LIQD, 15 mLs by Mouth Rinse route as needed for dry mouth., Disp: , Rfl:  .  aspirin 81 MG tablet, Take 81 mg by mouth daily., Disp: , Rfl:  .  clindamycin (CLEOCIN T) 1 % lotion, Apply 1 application topically daily., Disp: , Rfl:  .  clopidogrel (PLAVIX) 75 MG tablet, Take 1 tablet (75 mg total) by mouth daily., Disp: 90 tablet, Rfl: 3 .  cycloSPORINE (RESTASIS) 0.05 % ophthalmic emulsion, Place 1 drop into both eyes as needed. , Disp: , Rfl:  .  estrogen-methylTESTOSTERone 0.625-1.25 MG per tablet, Take 1 tablet by mouth daily., Disp: , Rfl:  .  furosemide (LASIX) 20 MG tablet, Take 1 tablet (20 mg total) by mouth daily., Disp: 90 tablet, Rfl: 3 .  gabapentin (NEURONTIN) 300 MG capsule, Take 300 mg by mouth 2 (two) times daily. , Disp: , Rfl:  .  isosorbide mononitrate (IMDUR) 30 MG 24 hr tablet, Take 1 tablet (30 mg total) by mouth daily., Disp: 30 tablet, Rfl: 3 .  levothyroxine (SYNTHROID, LEVOTHROID) 75 MCG tablet, Take 75 mcg by mouth daily., Disp: , Rfl:  .  metFORMIN (GLUCOPHAGE) 500 MG tablet, Take 1  tablet by mouth 2 (two) times daily., Disp: , Rfl: 0 .  nitroGLYCERIN (NITROSTAT) 0.4 MG SL tablet, Place 1 tablet (0.4 mg total) under the tongue every 5 (five) minutes x 3 doses as needed for chest pain., Disp: 25 tablet, Rfl: 3 .  oxyCODONE (OXY IR/ROXICODONE) 5 MG immediate release tablet, Take 5 mg by mouth at bedtime., Disp: , Rfl:  .  Oxycodone HCl 10 MG TABS, Take 1 tablet by mouth 4 (four) times daily., Disp: , Rfl: 0 .  pantoprazole (PROTONIX) 40 MG tablet, Take 1 tablet (40 mg total) by mouth daily., Disp: 90 tablet, Rfl: 3 .  PREMARIN vaginal cream, Place 1 Applicatorful vaginally once a week. , Disp: , Rfl:  .  ramipril (ALTACE) 5 MG capsule, Take 1 capsule (5 mg total) by mouth daily., Disp: 30 capsule, Rfl: 3 .  rosuvastatin (CRESTOR) 20 MG tablet, Take 1 tablet (20 mg total) by mouth daily at 6 PM., Disp: 90 tablet, Rfl: 3 .  valACYclovir (VALTREX) 500 MG tablet, Take 500 mg by mouth 2 (two) times daily. , Disp: , Rfl:  .  vitamin B-12 (CYANOCOBALAMIN) 1000 MCG tablet, Take 1,000 mcg by mouth daily., Disp: , Rfl:  .  zolpidem (AMBIEN) 10 MG tablet, Take 10 mg by mouth at bedtime as needed. , Disp: , Rfl:   Past Medical History: Past Medical History:  Diagnosis Date  . Bipolar affective disorder ([place])   .  Bradycardia   . CAD (coronary artery disease)    a. 08/2004 NSTEMI/PCI: LAD 64m/d (2.75 x 28 mm Taxus 2 DES);  b. 10/2010 Cath: LM nl, LAD patent stents w/ jailed septal, LCX 30, RCA 30; c. 06/2013 MV: No ischemia. Small fixed anteroseptal defect, ? scar vs atten. EF 62%; d. 03/2016 MV: EF 57%, Low risk.  . Chronic diastolic heart failure ([place])    a. Echo 7/13:  mod LVH, EF 65-70%, vigorous LV, Gr 1 diast dysfn, mild LAE; b. 05/2017 Echo: EF 60-65%, mod LVH, no rwma, Gr1 DD, mildly dil LA.  Marland Kitchen Chronic pain disorder   . CKD (chronic kidney disease), stage III ([place])   . DDD (degenerative disc disease), lumbosacral    , Spinal stenosis  . Diabetes mellitus ([place])    , Type II  .  GERD (gastroesophageal reflux disease)   . HTN (hypertension)   . Hyperlipidemia   . Hyperthyroidism   . IBS (irritable bowel syndrome)   . Lumbar disc disease   . Nonproliferative diabetic retinopathy associated with type 2 diabetes mellitus ([place])   . Obesity (BMI 30-39.9)   . Peripheral neuropathy    , Associated with DM  . Scoliosis     Tobacco Use: Social History   Tobacco Use  Smoking Status Former Smoker  . Years: 35.00  Smokeless Tobacco Never Used    Labs: Recent Chemical engineer    Labs for ITP Cardiac and Pulmonary Rehab Latest Ref Rng & Units 10/26/2010 02/13/2012 05/02/2012 05/19/2016 03/19/2018   Cholestrol 0 - 200 mg/dL 137 ATP III CLASSIFICATION: <200     mg/dL   Desirable 200-239  mg/dL   Borderline High >=240    mg/dL   High - 155 162 142   LDLCALC 0 - 99 mg/dL 65 Total Cholesterol/HDL:CHD Risk Coronary Heart Disease Risk Table Men   Women 1/2 Average Risk   3.4   3.3 Average Risk       5.0   4.4 2 X Average Risk   9.6   7.1 3 X Average Risk  23.4   11.0 Use the calculated Patient Ratio above and the CHD Risk Table to determine the patient's CHD Risk. ATP III CLASSIFICATION (LDL): <100     mg/dL   Optimal 100-129  mg/dL   Near or Above Optimal 130-159  mg/dL   Borderline 160-189  mg/dL   High >190     mg/dL   Very High - 80 103(H) 91   HDL >40 mg/dL 38(L) - 28(L) 31(L) 30(L)   Trlycerides <150 mg/dL 170(H) - 233(H) 138 103   Hemoglobin A1c <5.7 % 6.8 (NOTE)                                                                       According to the ADA Clinical Practice Recommendations for 2011, when HbA1c is used as a screening test:   >=6.5%   Diagnostic of Diabetes Mellitus           (if abnormal result is confirmed)  5.7-6.4%   Increased risk of developing Diabetes Mellitus  References:Diagnosis and Classification of Diabetes Mellitus,Diabetes DUKG,2542,70(WCBJS 1):S62-S69 and Standards of Medical Care in         Diabetes -  2011,Diabetes  IEPP,2951,88 (Suppl 1):S11-S61.(H) 6.7(H) - - -   TCO2 0 - 100 mmol/L 21 - - - -      Capillary Blood Glucose: Lab Results  Component Value Date   GLUCAP 107 (H) 03/21/2018   GLUCAP 95 03/19/2018   GLUCAP 90 03/19/2018   GLUCAP 106 (H) 03/18/2018   GLUCAP 84 03/18/2018     Exercise Target Goals: Exercise Program Goal: Individual exercise prescription set using results from initial 6 min walk test and THRR while considering  patient's activity barriers and safety.   Exercise Prescription Goal: Initial exercise prescription builds to 30-45 minutes a day of aerobic activity, 2-3 days per week.  Home exercise guidelines will be given to patient during program as part of exercise prescription that the participant will acknowledge.  Activity Barriers & Risk Stratification: Activity Barriers & Cardiac Risk Stratification - 06/14/18 1538    Activity Barriers & Cardiac Risk Stratification          Activity Barriers  Deconditioning;Muscular Weakness;Back Problems;Neck/Spine Problems;Assistive Device;Balance Concerns    Cardiac Risk Stratification  High           6 Minute Walk: 6 Minute Walk    6 Minute Walk    Row Name 06/14/18 1537   Phase  Initial   Distance  800 feet   Walk Time  6 minutes   # of Rest Breaks  2   MPH  1.51   METS  0.42   RPE  13   VO2 Peak  1.48   Symptoms  Yes (comment)   Comments  Fatigue   Resting HR  64 bpm   Resting BP  114/70   Resting Oxygen Saturation   96 %   Max Ex. HR  81 bpm   Max Ex. BP  118/70   2 Minute Post BP  122/68          Oxygen Initial Assessment:   Oxygen Re-Evaluation:   Oxygen Discharge (Final Oxygen Re-Evaluation):   Initial Exercise Prescription: Initial Exercise Prescription - 06/14/18 1600    Date of Initial Exercise RX and Referring Provider          Date  06/14/18    Referring Provider  Dr. Marlou Porch    Expected Discharge Date  09/23/18        NuStep          Level  2    SPM  75    Minutes  10     METs  1        Arm Ergometer          Level  1    Minutes  10    METs  1.3        Track          Laps  6    Minutes  10    METs  2        Prescription Details          Frequency (times per week)  3    Duration  Progress to 30 minutes of continuous aerobic without signs/symptoms of physical distress        Intensity          THRR 40-80% of Max Heartrate  54-108    Ratings of Perceived Exertion  11-13        Progression          Progression  Continue to progress workloads to maintain intensity without signs/symptoms of physical distress.  Resistance Training          Training Prescription  Yes    Weight  2 lbs.    Reps  10-15           Perform Capillary Blood Glucose checks as needed.  Exercise Prescription Changes:   Exercise Comments:   Exercise Goals and Review: Exercise Goals    Exercise Goals    Row Name 06/14/18 1539   Increase Physical Activity  Yes   Intervention  Provide advice, education, support and counseling about physical activity/exercise needs.;Develop an individualized exercise prescription for aerobic and resistive training based on initial evaluation findings, risk stratification, comorbidities and participant's personal goals.   Expected Outcomes  Short Term: Attend rehab on a regular basis to increase amount of physical activity.   Increase Strength and Stamina  Yes   Intervention  Provide advice, education, support and counseling about physical activity/exercise needs.;Develop an individualized exercise prescription for aerobic and resistive training based on initial evaluation findings, risk stratification, comorbidities and participant's personal goals.   Expected Outcomes  Short Term: Increase workloads from initial exercise prescription for resistance, speed, and METs.   Able to understand and use rate of perceived exertion (RPE) scale  Yes   Intervention  Provide education and explanation on how to use RPE scale   Expected  Outcomes  Short Term: Able to use RPE daily in rehab to express subjective intensity level;Long Term:  Able to use RPE to guide intensity level when exercising independently   Knowledge and understanding of Target Heart Rate Range (THRR)  Yes   Intervention  Provide education and explanation of THRR including how the numbers were predicted and where they are located for reference   Expected Outcomes  Short Term: Able to state/look up THRR;Long Term: Able to use THRR to govern intensity when exercising independently;Short Term: Able to use daily as guideline for intensity in rehab   Able to check pulse independently  Yes   Intervention  Provide education and demonstration on how to check pulse in carotid and radial arteries.;Review the importance of being able to check your own pulse for safety during independent exercise   Expected Outcomes  Short Term: Able to explain why pulse checking is important during independent exercise;Long Term: Able to check pulse independently and accurately   Understanding of Exercise Prescription  Yes   Intervention  Provide education, explanation, and written materials on patient's individual exercise prescription   Expected Outcomes  Short Term: Able to explain program exercise prescription;Long Term: Able to explain home exercise prescription to exercise independently          Exercise Goals Re-Evaluation :   Discharge Exercise Prescription (Final Exercise Prescription Changes):   Nutrition:  Target Goals: Understanding of nutrition guidelines, daily intake of sodium 1500mg , cholesterol 200mg , calories 30% from fat and 7% or less from saturated fats, daily to have 5 or more servings of fruits and vegetables.  Biometrics: Pre Biometrics - 06/14/18 1540    Pre Biometrics          Height  4\' 11"  (1.499 m)    Weight  79.8 kg    BMI (Calculated)  35.51            Nutrition Therapy Plan and Nutrition Goals:   Nutrition Assessments:   Nutrition  Goals Re-Evaluation:   Nutrition Goals Re-Evaluation:   Nutrition Goals Discharge (Final Nutrition Goals Re-Evaluation):   Psychosocial: Target Goals: Acknowledge presence or absence of significant depression and/or stress, maximize coping  skills, provide positive support system. Participant is able to verbalize types and ability to use techniques and skills needed for reducing stress and depression.  Initial Review & Psychosocial Screening:   Quality of Life Scores: Quality of Life - 06/14/18 1438    Quality of Life          Select  Quality of Life        Quality of Life Scores          Health/Function Pre  15.7 %    Socioeconomic Pre  22.8 %    Psych/Spiritual Pre  24.4 %    Family Pre  24.6 %    GLOBAL Pre  20.36 %          Scores of 19 and below usually indicate a poorer quality of life in these areas.  A difference of  2-3 points is a clinically meaningful difference.  A difference of 2-3 points in the total score of the Quality of Life Index has been associated with significant improvement in overall quality of life, self-image, physical symptoms, and general health in studies assessing change in quality of life.  PHQ-9: Recent Review Flowsheet Data    There is no flowsheet data to display.     Interpretation of Total Score  Total Score Depression Severity:  1-4 = Minimal depression, 5-9 = Mild depression, 10-14 = Moderate depression, 15-19 = Moderately severe depression, 20-27 = Severe depression   Psychosocial Evaluation and Intervention:   Psychosocial Re-Evaluation:   Psychosocial Discharge (Final Psychosocial Re-Evaluation):   Vocational Rehabilitation: Provide vocational rehab assistance to qualifying candidates.   Vocational Rehab Evaluation & Intervention:   Education: Education Goals: Education classes will be provided on a weekly basis, covering required topics. Participant will state understanding/return demonstration of topics  presented.  Learning Barriers/Preferences: Learning Barriers/Preferences - 06/14/18 1542    Learning Barriers/Preferences          Learning Barriers  Sight    Learning Preferences  Audio;Group Instruction;Individual Instruction;Pictoral;Skilled Demonstration;Verbal Instruction;Video;Written Material           Education Topics: Count Your Pulse:  -Group instruction provided by verbal instruction, demonstration, patient participation and written materials to support subject.  Instructors address importance of being able to find your pulse and how to count your pulse when at home without a heart monitor.  Patients get hands on experience counting their pulse with staff help and individually.   Heart Attack, Angina, and Risk Factor Modification:  -Group instruction provided by verbal instruction, video, and written materials to support subject.  Instructors address signs and symptoms of angina and heart attacks.    Also discuss risk factors for heart disease and how to make changes to improve heart health risk factors.   Functional Fitness:  -Group instruction provided by verbal instruction, demonstration, patient participation, and written materials to support subject.  Instructors address safety measures for doing things around the house.  Discuss how to get up and down off the floor, how to pick things up properly, how to safely get out of a chair without assistance, and balance training.   Meditation and Mindfulness:  -Group instruction provided by verbal instruction, patient participation, and written materials to support subject.  Instructor addresses importance of mindfulness and meditation practice to help reduce stress and improve awareness.  Instructor also leads participants through a meditation exercise.    Stretching for Flexibility and Mobility:  -Group instruction provided by verbal instruction, patient participation, and written materials to support subject.  Instructors  lead participants through series of stretches that are designed to increase flexibility thus improving mobility.  These stretches are additional exercise for major muscle groups that are typically performed during regular warm up and cool down.   Hands Only CPR:  -Group verbal, video, and participation provides a basic overview of AHA guidelines for community CPR. Role-play of emergencies allow participants the opportunity to practice calling for help and chest compression technique with discussion of AED use.   Hypertension: -Group verbal and written instruction that provides a basic overview of hypertension including the most recent diagnostic guidelines, risk factor reduction with self-care instructions and medication management.    Nutrition I class: Heart Healthy Eating:  -Group instruction provided by PowerPoint slides, verbal discussion, and written materials to support subject matter. The instructor gives an explanation and review of the Therapeutic Lifestyle Changes diet recommendations, which includes a discussion on lipid goals, dietary fat, sodium, fiber, plant stanol/sterol esters, sugar, and the components of a well-balanced, healthy diet.   Nutrition II class: Lifestyle Skills:  -Group instruction provided by PowerPoint slides, verbal discussion, and written materials to support subject matter. The instructor gives an explanation and review of label reading, grocery shopping for heart health, heart healthy recipe modifications, and ways to make healthier choices when eating out.   Diabetes Question & Answer:  -Group instruction provided by PowerPoint slides, verbal discussion, and written materials to support subject matter. The instructor gives an explanation and review of diabetes co-morbidities, pre- and post-prandial blood glucose goals, pre-exercise blood glucose goals, signs, symptoms, and treatment of hypoglycemia and hyperglycemia, and foot care basics.   Diabetes  Blitz:  -Group instruction provided by PowerPoint slides, verbal discussion, and written materials to support subject matter. The instructor gives an explanation and review of the physiology behind type 1 and type 2 diabetes, diabetes medications and rational behind using different medications, pre- and post-prandial blood glucose recommendations and Hemoglobin A1c goals, diabetes diet, and exercise including blood glucose guidelines for exercising safely.    Portion Distortion:  -Group instruction provided by PowerPoint slides, verbal discussion, written materials, and food models to support subject matter. The instructor gives an explanation of serving size versus portion size, changes in portions sizes over the last 20 years, and what consists of a serving from each food group.   Stress Management:  -Group instruction provided by verbal instruction, video, and written materials to support subject matter.  Instructors review role of stress in heart disease and how to cope with stress positively.     Exercising on Your Own:  -Group instruction provided by verbal instruction, power point, and written materials to support subject.  Instructors discuss benefits of exercise, components of exercise, frequency and intensity of exercise, and end points for exercise.  Also discuss use of nitroglycerin and activating EMS.  Review options of places to exercise outside of rehab.  Review guidelines for sex with heart disease.   Cardiac Drugs I:  -Group instruction provided by verbal instruction and written materials to support subject.  Instructor reviews cardiac drug classes: antiplatelets, anticoagulants, beta blockers, and statins.  Instructor discusses reasons, side effects, and lifestyle considerations for each drug class.   Cardiac Drugs II:  -Group instruction provided by verbal instruction and written materials to support subject.  Instructor reviews cardiac drug classes: angiotensin converting  enzyme inhibitors (ACE-I), angiotensin II receptor blockers (ARBs), nitrates, and calcium channel blockers.  Instructor discusses reasons, side effects, and lifestyle considerations for each drug class.   Anatomy  and Physiology of the Circulatory System:  Group verbal and written instruction and models provide basic cardiac anatomy and physiology, with the coronary electrical and arterial systems. Review of: AMI, Angina, Valve disease, Heart Failure, Peripheral Artery Disease, Cardiac Arrhythmia, Pacemakers, and the ICD.   Other Education:  -Group or individual verbal, written, or video instructions that support the educational goals of the cardiac rehab program.   Holiday Eating Survival Tips:  -Group instruction provided by PowerPoint slides, verbal discussion, and written materials to support subject matter. The instructor gives patients tips, tricks, and techniques to help them not only survive but enjoy the holidays despite the onslaught of food that accompanies the holidays.   Knowledge Questionnaire Score: Knowledge Questionnaire Score - 06/14/18 1438    Knowledge Questionnaire Score          Pre Score  21/24           Core Components/Risk Factors/Patient Goals at Admission: Personal Goals and Risk Factors at Admission - 06/14/18 1542    Core Components/Risk Factors/Patient Goals on Admission           Weight Management  Yes;Obesity;Weight Maintenance;Weight Loss    Intervention  Weight Management: Develop a combined nutrition and exercise program designed to reach desired caloric intake, while maintaining appropriate intake of nutrient and fiber, sodium and fats, and appropriate energy expenditure required for the weight goal.;Weight Management: Provide education and appropriate resources to help participant work on and attain dietary goals.;Weight Management/Obesity: Establish reasonable short term and long term weight goals.;Obesity: Provide education and appropriate resources  to help participant work on and attain dietary goals.    Admit Weight  175 lb 14.8 oz (79.8 kg)    Expected Outcomes  Short Term: Continue to assess and modify interventions until short term weight is achieved;Long Term: Adherence to nutrition and physical activity/exercise program aimed toward attainment of established weight goal;Weight Maintenance: Understanding of the daily nutrition guidelines, which includes 25-35% calories from fat, 7% or less cal from saturated fats, less than 200mg  cholesterol, less than 1.5gm of sodium, & 5 or more servings of fruits and vegetables daily;Weight Loss: Understanding of general recommendations for a balanced deficit meal plan, which promotes 1-2 lb weight loss per week and includes a negative energy balance of [phone] kcal/d;Understanding recommendations for meals to include 15-35% energy as protein, 25-35% energy from fat, 35-60% energy from carbohydrates, less than 200mg  of dietary cholesterol, 20-35 gm of total fiber daily;Understanding of distribution of calorie intake throughout the day with the consumption of 4-5 meals/snacks    Diabetes  Yes    Intervention  Provide education about signs/symptoms and action to take for hypo/hyperglycemia.;Provide education about proper nutrition, including hydration, and aerobic/resistive exercise prescription along with prescribed medications to achieve blood glucose in normal ranges: Fasting glucose 65-99 mg/dL    Expected Outcomes  Short Term: Participant verbalizes understanding of the signs/symptoms and immediate care of hyper/hypoglycemia, proper foot care and importance of medication, aerobic/resistive exercise and nutrition plan for blood glucose control.;Long Term: Attainment of HbA1C < 7%.    Hypertension  Yes    Intervention  Provide education on lifestyle modifcations including regular physical activity/exercise, weight management, moderate sodium restriction and increased consumption of fresh fruit, vegetables, and  low fat dairy, alcohol moderation, and smoking cessation.;Monitor prescription use compliance.    Expected Outcomes  Short Term: Continued assessment and intervention until BP is < 140/19mm HG in hypertensive participants. < 130/44mm HG in hypertensive participants with diabetes, heart failure or chronic kidney  disease.;Long Term: Maintenance of blood pressure at goal levels.    Lipids  Yes    Intervention  Provide education and support for participant on nutrition & aerobic/resistive exercise along with prescribed medications to achieve LDL 70mg , HDL >40mg .    Expected Outcomes  Short Term: Participant states understanding of desired cholesterol values and is compliant with medications prescribed. Participant is following exercise prescription and nutrition guidelines.;Long Term: Cholesterol controlled with medications as prescribed, with individualized exercise RX and with personalized nutrition plan. Value goals: LDL < 70mg , HDL > 40 mg.    Stress  Yes    Intervention  Offer individual and/or small group education and counseling on adjustment to heart disease, stress management and health-related lifestyle change. Teach and support self-help strategies.;Refer participants experiencing significant psychosocial distress to appropriate mental health specialists for further evaluation and treatment. When possible, include family members and significant others in education/counseling sessions.    Expected Outcomes  Short Term: Participant demonstrates changes in health-related behavior, relaxation and other stress management skills, ability to obtain effective social support, and compliance with psychotropic medications if prescribed.;Long Term: Emotional wellbeing is indicated by absence of clinically significant psychosocial distress or social isolation.           Core Components/Risk Factors/Patient Goals Review:    Core Components/Risk Factors/Patient Goals at Discharge (Final Review):    ITP  Comments: ITP Comments    Row Name 06/14/18 1435   ITP Comments  Dr.Traci Turner, Medical Director       Comments: Patient attended orientation from 1348 to 1520 to review rules and guidelines for program. Completed 6 minute walk test, Intitial ITP, and exercise prescription.  VSS. Telemetry-sinus rhythm, first degree AV Block, sinus arrhythmia. Pt c/o fatigue with walk test.  Pt took 3 standing rest breaks.   Andi Hence, RN, BSN  Cardiac Pulmonary Rehab 06/14/18  4:29 PM

## 2018-06-14 NOTE — Progress Notes (Signed)
Kristine Oliver 84 y.o. female DOB: 1930-10-05 MRN: [id number]      Nutrition Note  1. 08/16/2019NSTEMI (non-ST elevated myocardial infarction) ([place])   2. 08/16/19Stented coronary artery    Past Medical History:  Diagnosis Date  . Bipolar affective disorder ([place])   . Bradycardia   . CAD (coronary artery disease)    a. 08/2004 NSTEMI/PCI: LAD 72m/d (2.75 x 28 mm Taxus 2 DES);  b. 10/2010 Cath: LM nl, LAD patent stents w/ jailed septal, LCX 30, RCA 30; c. 06/2013 MV: No ischemia. Small fixed anteroseptal defect, ? scar vs atten. EF 62%; d. 03/2016 MV: EF 57%, Low risk.  . Chronic diastolic heart failure ([place])    a. Echo 7/13:  mod LVH, EF 65-70%, vigorous LV, Gr 1 diast dysfn, mild LAE; b. 05/2017 Echo: EF 60-65%, mod LVH, no rwma, Gr1 DD, mildly dil LA.  Marland Kitchen Chronic pain disorder   . CKD (chronic kidney disease), stage III ([place])   . DDD (degenerative disc disease), lumbosacral    , Spinal stenosis  . Diabetes mellitus ([place])    , Type II  . GERD (gastroesophageal reflux disease)   . HTN (hypertension)   . Hyperlipidemia   . Hyperthyroidism   . IBS (irritable bowel syndrome)   . Lumbar disc disease   . Nonproliferative diabetic retinopathy associated with type 2 diabetes mellitus ([place])   . Obesity (BMI 30-39.9)   . Peripheral neuropathy    , Associated with DM  . Scoliosis    Meds reviewed.   Current Outpatient Medications (Endocrine & Metabolic):  .  estrogen-methylTESTOSTERone 0.625-1.25 MG per tablet, Take 1 tablet by mouth daily. Marland Kitchen  levothyroxine (SYNTHROID, LEVOTHROID) 75 MCG tablet, Take 75 mcg by mouth daily. .  metFORMIN (GLUCOPHAGE) 500 MG tablet, Take 1 tablet by mouth 2 (two) times daily.  Current Outpatient Medications (Cardiovascular):  .  amLODipine (NORVASC) 5 MG tablet, Take 1 tablet (5 mg total) by mouth daily. .  furosemide (LASIX) 20 MG tablet, Take 1 tablet (20 mg total) by mouth daily. .  isosorbide mononitrate (IMDUR) 30 MG 24 hr tablet, Take 1 tablet (30 mg  total) by mouth daily. .  nitroGLYCERIN (NITROSTAT) 0.4 MG SL tablet, Place 1 tablet (0.4 mg total) under the tongue every 5 (five) minutes x 3 doses as needed for chest pain. .  ramipril (ALTACE) 5 MG capsule, Take 1 capsule (5 mg total) by mouth daily. .  rosuvastatin (CRESTOR) 20 MG tablet, Take 1 tablet (20 mg total) by mouth daily at 6 PM.   Current Outpatient Medications (Analgesics):  .  aspirin 81 MG tablet, Take 81 mg by mouth daily. Marland Kitchen  oxyCODONE (OXY IR/ROXICODONE) 5 MG immediate release tablet, Take 5 mg by mouth at bedtime. .  Oxycodone HCl 10 MG TABS, Take 1 tablet by mouth 4 (four) times daily.  Current Outpatient Medications (Hematological):  .  clopidogrel (PLAVIX) 75 MG tablet, Take 1 tablet (75 mg total) by mouth daily. .  vitamin B-12 (CYANOCOBALAMIN) 1000 MCG tablet, Take 1,000 mcg by mouth daily.  Current Outpatient Medications (Other):  Marland Kitchen  ALPRAZolam (XANAX) 0.25 MG tablet, Take 0.25 mg by mouth 3 (three) times daily as needed. anxiety .  antiseptic oral rinse (BIOTENE) LIQD, 15 mLs by Mouth Rinse route as needed for dry mouth. .  clindamycin (CLEOCIN T) 1 % lotion, Apply 1 application topically daily. .  cycloSPORINE (RESTASIS) 0.05 % ophthalmic emulsion, Place 1 drop into both eyes as needed.  .  gabapentin (NEURONTIN)  300 MG capsule, Take 300 mg by mouth 2 (two) times daily.  .  pantoprazole (PROTONIX) 40 MG tablet, Take 1 tablet (40 mg total) by mouth daily. Marland Kitchen  PREMARIN vaginal cream, Place 1 Applicatorful vaginally once a week.  .  valACYclovir (VALTREX) 500 MG tablet, Take 500 mg by mouth 2 (two) times daily.  Marland Kitchen  zolpidem (AMBIEN) 10 MG tablet, Take 10 mg by mouth at bedtime as needed.    HT: Ht Readings from Last 1 Encounters:  06/14/18 4\' 11"  (1.499 m)    WT: Wt Readings from Last 5 Encounters:  06/14/18 175 lb 14.8 oz (79.8 kg)  05/02/18 174 lb 6.4 oz (79.1 kg)  03/29/18 174 lb 12.8 oz (79.3 kg)  03/22/18 174 lb 13.2 oz (79.3 kg)  05/06/17 171 lb  (77.6 kg)     Body mass index is 35.53 kg/m.   Current tobacco use? No  Labs:  Lipid Panel     Component Value Date/Time   CHOL 142 03/19/2018 0423   TRIG 103 03/19/2018 0423   HDL 30 (L) 03/19/2018 0423   CHOLHDL 4.7 03/19/2018 0423   VLDL 21 03/19/2018 0423   LDLCALC 91 03/19/2018 0423    Lab Results  Component Value Date   HGBA1C 6.7 (H) 02/13/2012   CBG (last 3)  No results for input(s): GLUCAP in the last 72 hours.  Nutrition Note Spoke with pt. Nutrition plan and goals reviewed with pt. Pt is following Step 2 of the Therapeutic Lifestyle Changes diet. Pt wants tocontinue to follow a heart healthy diet. Discussed heart healthy tips (label reading, how to build a healthy plate, portion sizes, eating frequently across the day). Pt has Type 2 Diabetes. Last A1c indicates blood glucose well-controlled. This Probation officer went over Diabetes Education test results. Per discussion, pt does not use canned/convenience foods often. Pt does not add salt to food. Pt does not eat out frequently. Pt shared she has lost "a lot of weight, maybe 50 lbs in the last 6 years". Reviewed EMR pt has lost 30 lbs in the last 6 years. Patient attributes weight loss to eating smaller portions, changing the style of cooking she is eating, avoiding fried foods, and limiting sweets. Praised pt on these changes and encouraged her to keep up the great work. Pt expressed understanding of the information reviewed. Pt aware of nutrition education classes offered and would like to attend nutrition classes.  Nutrition Diagnosis ? Food-and nutrition-related knowledge deficit related to lack of exposure to information as related to diagnosis of: ? CVD ? Type 2 Diabetes Nutrition Intervention ? Pt's individual nutrition plan and goals reviewed with pt. ? Pt given handouts for: ? Nutrition I class ? Nutrition II class  ? Diabetes Blitz Class ? Diabetes Q & A class  ? Consistent vit K diet ? low sodium ? DM ?  pre-diabetes  Nutrition Goal(s):  ? Pt to continue to identify and limit food sources of saturated fat, trans fat, refined carbohydrates and sodium ? Pt to watch out for sweets and added sugar.  Plan:  ? Pt to attend nutrition classes ? Nutrition I ? Nutrition II ? Portion Distortion  ? Diabetes Blitz ? Diabetes Q & Ae determined ? Will provide client-centered nutrition education as part of interdisciplinary care ? Monitor and evaluate progress toward nutrition goal with team.   Laurina Bustle, MS, RD, LDN 06/14/2018 4:25 PM

## 2018-06-20 ENCOUNTER — Ambulatory Visit (HOSPITAL_COMMUNITY): Payer: Medicare Other

## 2018-06-20 ENCOUNTER — Encounter (HOSPITAL_COMMUNITY)
Admission: RE | Admit: 2018-06-20 | Discharge: 2018-06-20 | Disposition: A | Discharge status: Home / Self Care | Payer: Medicare Other | Source: Ambulatory Visit | Attending: Cardiology | Admitting: Cardiology

## 2018-06-20 DIAGNOSIS — I214 Non-ST elevation (NSTEMI) myocardial infarction: Secondary | ICD-10-CM | POA: Diagnosis not present

## 2018-06-20 DIAGNOSIS — Z955 Presence of coronary angioplasty implant and graft: Secondary | ICD-10-CM

## 2018-06-20 LAB — GLUCOSE, CAPILLARY: Glucose-Capillary: 142 mg/dL — ABNORMAL HIGH (ref 70–99)

## 2018-06-20 NOTE — Progress Notes (Signed)
Cardiac Individual Treatment Plan  Patient Details  Name: Kristine Oliver MRN: [id number] Date of Birth: Jan 10, 1931 Referring Provider:     CARDIAC REHAB PHASE II ORIENTATION from 06/14/2018 in [place]  Referring Provider  Dr. Marlou Porch      Initial Encounter Date:    CARDIAC REHAB PHASE II ORIENTATION from 06/14/2018 in [place]  Date  06/14/18      Visit Diagnosis: 08/16/2019NSTEMI (non-ST elevated myocardial infarction) [facility])  08/16/19Stented coronary artery  Patient's Home Medications on Admission:  Current Outpatient Medications:  .  ALPRAZolam (XANAX) 0.25 MG tablet, Take 0.25 mg by mouth 3 (three) times daily as needed. anxiety, Disp: , Rfl:  .  amLODipine (NORVASC) 5 MG tablet, Take 1 tablet (5 mg total) by mouth daily., Disp: 30 tablet, Rfl: 3 .  antiseptic oral rinse (BIOTENE) LIQD, 15 mLs by Mouth Rinse route as needed for dry mouth., Disp: , Rfl:  .  aspirin 81 MG tablet, Take 81 mg by mouth daily., Disp: , Rfl:  .  clindamycin (CLEOCIN T) 1 % lotion, Apply 1 application topically daily., Disp: , Rfl:  .  clopidogrel (PLAVIX) 75 MG tablet, Take 1 tablet (75 mg total) by mouth daily., Disp: 90 tablet, Rfl: 3 .  cycloSPORINE (RESTASIS) 0.05 % ophthalmic emulsion, Place 1 drop into both eyes as needed. , Disp: , Rfl:  .  estrogen-methylTESTOSTERone 0.625-1.25 MG per tablet, Take 1 tablet by mouth daily., Disp: , Rfl:  .  furosemide (LASIX) 20 MG tablet, Take 1 tablet (20 mg total) by mouth daily., Disp: 90 tablet, Rfl: 3 .  gabapentin (NEURONTIN) 300 MG capsule, Take 300 mg by mouth 2 (two) times daily. , Disp: , Rfl:  .  isosorbide mononitrate (IMDUR) 30 MG 24 hr tablet, Take 1 tablet (30 mg total) by mouth daily., Disp: 30 tablet, Rfl: 3 .  levothyroxine (SYNTHROID, LEVOTHROID) 75 MCG tablet, Take 75 mcg by mouth daily., Disp: , Rfl:  .  metFORMIN (GLUCOPHAGE) 500 MG tablet, Take 1 tablet by mouth 2 (two)  times daily., Disp: , Rfl: 0 .  nitroGLYCERIN (NITROSTAT) 0.4 MG SL tablet, Place 1 tablet (0.4 mg total) under the tongue every 5 (five) minutes x 3 doses as needed for chest pain., Disp: 25 tablet, Rfl: 3 .  oxyCODONE (OXY IR/ROXICODONE) 5 MG immediate release tablet, Take 5 mg by mouth at bedtime., Disp: , Rfl:  .  Oxycodone HCl 10 MG TABS, Take 1 tablet by mouth 4 (four) times daily., Disp: , Rfl: 0 .  pantoprazole (PROTONIX) 40 MG tablet, Take 1 tablet (40 mg total) by mouth daily., Disp: 90 tablet, Rfl: 3 .  PREMARIN vaginal cream, Place 1 Applicatorful vaginally once a week. , Disp: , Rfl:  .  ramipril (ALTACE) 5 MG capsule, Take 1 capsule (5 mg total) by mouth daily., Disp: 30 capsule, Rfl: 3 .  rosuvastatin (CRESTOR) 20 MG tablet, Take 1 tablet (20 mg total) by mouth daily at 6 PM., Disp: 90 tablet, Rfl: 3 .  valACYclovir (VALTREX) 500 MG tablet, Take 500 mg by mouth 2 (two) times daily. , Disp: , Rfl:  .  vitamin B-12 (CYANOCOBALAMIN) 1000 MCG tablet, Take 1,000 mcg by mouth daily., Disp: , Rfl:  .  zolpidem (AMBIEN) 10 MG tablet, Take 10 mg by mouth at bedtime as needed. , Disp: , Rfl:   Past Medical History: Past Medical History:  Diagnosis Date  . Bipolar affective disorder ([place])   .  Bradycardia   . CAD (coronary artery disease)    a. 08/2004 NSTEMI/PCI: LAD 71m/d (2.75 x 28 mm Taxus 2 DES);  b. 10/2010 Cath: LM nl, LAD patent stents w/ jailed septal, LCX 30, RCA 30; c. 06/2013 MV: No ischemia. Small fixed anteroseptal defect, ? scar vs atten. EF 62%; d. 03/2016 MV: EF 57%, Low risk.  . Chronic diastolic heart failure ([place])    a. Echo 7/13:  mod LVH, EF 65-70%, vigorous LV, Gr 1 diast dysfn, mild LAE; b. 05/2017 Echo: EF 60-65%, mod LVH, no rwma, Gr1 DD, mildly dil LA.  Marland Kitchen Chronic pain disorder   . CKD (chronic kidney disease), stage III ([place])   . DDD (degenerative disc disease), lumbosacral    , Spinal stenosis  . Diabetes mellitus ([place])    , Type II  . GERD (gastroesophageal  reflux disease)   . HTN (hypertension)   . Hyperlipidemia   . Hyperthyroidism   . IBS (irritable bowel syndrome)   . Lumbar disc disease   . Nonproliferative diabetic retinopathy associated with type 2 diabetes mellitus ([place])   . Obesity (BMI 30-39.9)   . Peripheral neuropathy    , Associated with DM  . Scoliosis     Tobacco Use: Social History   Tobacco Use  Smoking Status Former Smoker  . Years: 35.00  Smokeless Tobacco Never Used    Labs: Recent Chemical engineer    Labs for ITP Cardiac and Pulmonary Rehab Latest Ref Rng & Units 10/26/2010 02/13/2012 05/02/2012 05/19/2016 03/19/2018   Cholestrol 0 - 200 mg/dL 137 ATP III CLASSIFICATION: <200     mg/dL   Desirable 200-239  mg/dL   Borderline High >=240    mg/dL   High - 155 162 142   LDLCALC 0 - 99 mg/dL 65 Total Cholesterol/HDL:CHD Risk Coronary Heart Disease Risk Table Men   Women 1/2 Average Risk   3.4   3.3 Average Risk       5.0   4.4 2 X Average Risk   9.6   7.1 3 X Average Risk  23.4   11.0 Use the calculated Patient Ratio above and the CHD Risk Table to determine the patient's CHD Risk. ATP III CLASSIFICATION (LDL): <100     mg/dL   Optimal 100-129  mg/dL   Near or Above Optimal 130-159  mg/dL   Borderline 160-189  mg/dL   High >190     mg/dL   Very High - 80 103(H) 91   HDL >40 mg/dL 38(L) - 28(L) 31(L) 30(L)   Trlycerides <150 mg/dL 170(H) - 233(H) 138 103   Hemoglobin A1c <5.7 % 6.8 (NOTE)                                                                       According to the ADA Clinical Practice Recommendations for 2011, when HbA1c is used as a screening test:   >=6.5%   Diagnostic of Diabetes Mellitus           (if abnormal result is confirmed)  5.7-6.4%   Increased risk of developing Diabetes Mellitus  References:Diagnosis and Classification of Diabetes Mellitus,Diabetes [id number],2297,98(XQJJH 1):S62-S69 and Standards of Medical Care in         Diabetes -  2011,Diabetes XHBZ,1696,78 (Suppl  1):S11-S61.(H) 6.7(H) - - -   TCO2 0 - 100 mmol/L 21 - - - -      Capillary Blood Glucose: Lab Results  Component Value Date   GLUCAP 114 (H) 06/22/2018   GLUCAP 142 (H) 06/20/2018   GLUCAP 107 (H) 03/21/2018   GLUCAP 95 03/19/2018   GLUCAP 90 03/19/2018     Exercise Target Goals: Exercise Program Goal: Individual exercise prescription set using results from initial 6 min walk test and THRR while considering  patient's activity barriers and safety.   Exercise Prescription Goal: Initial exercise prescription builds to 30-45 minutes a day of aerobic activity, 2-3 days per week.  Home exercise guidelines will be given to patient during program as part of exercise prescription that the participant will acknowledge.  Activity Barriers & Risk Stratification: Activity Barriers & Cardiac Risk Stratification - 06/14/18 1538      Activity Barriers & Cardiac Risk Stratification   Activity Barriers  Deconditioning;Muscular Weakness;Back Problems;Neck/Spine Problems;Assistive Device;Balance Concerns    Cardiac Risk Stratification  High       6 Minute Walk: 6 Minute Walk    Row Name 06/14/18 1537         6 Minute Walk   Phase  Initial     Distance  800 feet     Walk Time  6 minutes     # of Rest Breaks  2     MPH  1.51     METS  0.42     RPE  13     VO2 Peak  1.48     Symptoms  Yes (comment)     Comments  Fatigue     Resting HR  64 bpm     Resting BP  114/70     Resting Oxygen Saturation   96 %     Max Ex. HR  81 bpm     Max Ex. BP  118/70     2 Minute Post BP  122/68        Oxygen Initial Assessment:   Oxygen Re-Evaluation:   Oxygen Discharge (Final Oxygen Re-Evaluation):   Initial Exercise Prescription: Initial Exercise Prescription - 06/14/18 1600      Date of Initial Exercise RX and Referring Provider   Date  06/14/18    Referring Provider  Dr. Marlou Porch    Expected Discharge Date  09/23/18      NuStep   Level  2    SPM  75    Minutes  10    METs  1       Arm Ergometer   Level  1    Minutes  10    METs  1.3      Track   Laps  6    Minutes  10    METs  2      Prescription Details   Frequency (times per week)  3    Duration  Progress to 30 minutes of continuous aerobic without signs/symptoms of physical distress      Intensity   THRR 40-80% of Max Heartrate  54-108    Ratings of Perceived Exertion  11-13      Progression   Progression  Continue to progress workloads to maintain intensity without signs/symptoms of physical distress.      Resistance Training   Training Prescription  Yes    Weight  2 lbs.    Reps  10-15       Perform Capillary  Blood Glucose checks as needed.  Exercise Prescription Changes:  Exercise Prescription Changes    Row Name 06/20/18 1504             Response to Exercise   Blood Pressure (Admit)  120/70       Blood Pressure (Exercise)  118/84       Blood Pressure (Exit)  110/70       Heart Rate (Admit)  73 bpm       Heart Rate (Exercise)  74 bpm       Heart Rate (Exit)  73 bpm       Rating of Perceived Exertion (Exercise)  13       Symptoms  none       Duration  Progress to 30 minutes of  aerobic without signs/symptoms of physical distress       Intensity  THRR unchanged         Progression   Progression  Continue to progress workloads to maintain intensity without signs/symptoms of physical distress.       Average METs  1.9         Resistance Training   Training Prescription  Yes       Weight  2 lbs.       Reps  10-15       Time  10 Minutes         Interval Training   Interval Training  No         NuStep   Level  2       SPM  75       Minutes  10       METs  1.7         Arm Ergometer   Level  1       Minutes  10         Track   Laps  6       Minutes  10       METs  2.05          Exercise Comments:  Exercise Comments    Row Name 06/20/18 1554           Exercise Comments  Patient able to tolerate low intensity exercise, increase workloads as tolerated.           Exercise Goals and Review:  Exercise Goals    Row Name 06/14/18 1539             Exercise Goals   Increase Physical Activity  Yes       Intervention  Provide advice, education, support and counseling about physical activity/exercise needs.;Develop an individualized exercise prescription for aerobic and resistive training based on initial evaluation findings, risk stratification, comorbidities and participant's personal goals.       Expected Outcomes  Short Term: Attend rehab on a regular basis to increase amount of physical activity.       Increase Strength and Stamina  Yes       Intervention  Provide advice, education, support and counseling about physical activity/exercise needs.;Develop an individualized exercise prescription for aerobic and resistive training based on initial evaluation findings, risk stratification, comorbidities and participant's personal goals.       Expected Outcomes  Short Term: Increase workloads from initial exercise prescription for resistance, speed, and METs.       Able to understand and use rate of perceived exertion (RPE) scale  Yes       Intervention  Provide education and  explanation on how to use RPE scale       Expected Outcomes  Short Term: Able to use RPE daily in rehab to express subjective intensity level;Long Term:  Able to use RPE to guide intensity level when exercising independently       Knowledge and understanding of Target Heart Rate Range (THRR)  Yes       Intervention  Provide education and explanation of THRR including how the numbers were predicted and where they are located for reference       Expected Outcomes  Short Term: Able to state/look up THRR;Long Term: Able to use THRR to govern intensity when exercising independently;Short Term: Able to use daily as guideline for intensity in rehab       Able to check pulse independently  Yes       Intervention  Provide education and demonstration on how to check pulse in carotid and  radial arteries.;Review the importance of being able to check your own pulse for safety during independent exercise       Expected Outcomes  Short Term: Able to explain why pulse checking is important during independent exercise;Long Term: Able to check pulse independently and accurately       Understanding of Exercise Prescription  Yes       Intervention  Provide education, explanation, and written materials on patient's individual exercise prescription       Expected Outcomes  Short Term: Able to explain program exercise prescription;Long Term: Able to explain home exercise prescription to exercise independently          Exercise Goals Re-Evaluation : Exercise Goals Re-Evaluation    Row Name 06/20/18 1554             Exercise Goal Re-Evaluation   Exercise Goals Review  Able to understand and use rate of perceived exertion (RPE) scale;Increase Physical Activity       Comments  Patient able to understand and use RPE scale appropriately.       Expected Outcomes  Increase workloads as tolerated to help improve cardiorespiratory fitness.          Discharge Exercise Prescription (Final Exercise Prescription Changes): Exercise Prescription Changes - 06/20/18 1504      Response to Exercise   Blood Pressure (Admit)  120/70    Blood Pressure (Exercise)  118/84    Blood Pressure (Exit)  110/70    Heart Rate (Admit)  73 bpm    Heart Rate (Exercise)  74 bpm    Heart Rate (Exit)  73 bpm    Rating of Perceived Exertion (Exercise)  13    Symptoms  none    Duration  Progress to 30 minutes of  aerobic without signs/symptoms of physical distress    Intensity  THRR unchanged      Progression   Progression  Continue to progress workloads to maintain intensity without signs/symptoms of physical distress.    Average METs  1.9      Resistance Training   Training Prescription  Yes    Weight  2 lbs.    Reps  10-15    Time  10 Minutes      Interval Training   Interval Training  No       NuStep   Level  2    SPM  75    Minutes  10    METs  1.7      Arm Ergometer   Level  1    Minutes  10  Track   Laps  6    Minutes  10    METs  2.05       Nutrition:  Target Goals: Understanding of nutrition guidelines, daily intake of sodium 1500mg , cholesterol 200mg , calories 30% from fat and 7% or less from saturated fats, daily to have 5 or more servings of fruits and vegetables.  Biometrics: Pre Biometrics - 06/14/18 1540      Pre Biometrics   Height  4\' 11"  (1.499 m)    Weight  79.8 kg    BMI (Calculated)  35.51        Nutrition Therapy Plan and Nutrition Goals: Nutrition Therapy & Goals - 06/14/18 1629      Nutrition Therapy   Diet  general healthful      Personal Nutrition Goals   Nutrition Goal  Pt to continue to identify and limit food sources of saturated fat, trans fat, refined carbohydrates and sodium    Personal Goal #2  Pt to watch out for sweets and added sugar.      Intervention Plan   Intervention  Prescribe, educate and counsel regarding individualized specific dietary modifications aiming towards targeted core components such as weight, hypertension, lipid management, diabetes, heart failure and other comorbidities.    Expected Outcomes  Short Term Goal: A plan has been developed with personal nutrition goals set during dietitian appointment.       Nutrition Assessments: Nutrition Assessments - 06/14/18 1630      MEDFICTS Scores   Pre Score  28       Nutrition Goals Re-Evaluation: Nutrition Goals Re-Evaluation    Row Name 06/14/18 1629             Goals   Current Weight  173 lb 15.1 oz (78.9 kg)          Nutrition Goals Re-Evaluation: Nutrition Goals Re-Evaluation    Row Name 06/14/18 1629             Goals   Current Weight  173 lb 15.1 oz (78.9 kg)          Nutrition Goals Discharge (Final Nutrition Goals Re-Evaluation): Nutrition Goals Re-Evaluation - 06/14/18 1629      Goals   Current Weight  173 lb 15.1  oz (78.9 kg)       Psychosocial: Target Goals: Acknowledge presence or absence of significant depression and/or stress, maximize coping skills, provide positive support system. Participant is able to verbalize types and ability to use techniques and skills needed for reducing stress and depression.  Initial Review & Psychosocial Screening:   Quality of Life Scores: Quality of Life - 06/14/18 1438      Quality of Life   Select  Quality of Life      Quality of Life Scores   Health/Function Pre  15.7 %    Socioeconomic Pre  22.8 %    Psych/Spiritual Pre  24.4 %    Family Pre  24.6 %    GLOBAL Pre  20.36 %      Scores of 19 and below usually indicate a poorer quality of life in these areas.  A difference of  2-3 points is a clinically meaningful difference.  A difference of 2-3 points in the total score of the Quality of Life Index has been associated with significant improvement in overall quality of life, self-image, physical symptoms, and general health in studies assessing change in quality of life.  PHQ-9: Recent Review Flowsheet Data    Depression screen  PHQ 2/9 06/20/2018   Decreased Interest 0   Down, Depressed, Hopeless 0   PHQ - 2 Score 0     Interpretation of Total Score  Total Score Depression Severity:  1-4 = Minimal depression, 5-9 = Mild depression, 10-14 = Moderate depression, 15-19 = Moderately severe depression, 20-27 = Severe depression   Psychosocial Evaluation and Intervention:   Psychosocial Re-Evaluation:   Psychosocial Discharge (Final Psychosocial Re-Evaluation):   Vocational Rehabilitation: Provide vocational rehab assistance to qualifying candidates.   Vocational Rehab Evaluation & Intervention:   Education: Education Goals: Education classes will be provided on a weekly basis, covering required topics. Participant will state understanding/return demonstration of topics presented.  Learning Barriers/Preferences: Learning  Barriers/Preferences - 06/14/18 1542      Learning Barriers/Preferences   Learning Barriers  Sight    Learning Preferences  Audio;Group Instruction;Individual Instruction;Pictoral;Skilled Demonstration;Verbal Instruction;Video;Written Material       Education Topics: Count Your Pulse:  -Group instruction provided by verbal instruction, demonstration, patient participation and written materials to support subject.  Instructors address importance of being able to find your pulse and how to count your pulse when at home without a heart monitor.  Patients get hands on experience counting their pulse with staff help and individually.   Heart Attack, Angina, and Risk Factor Modification:  -Group instruction provided by verbal instruction, video, and written materials to support subject.  Instructors address signs and symptoms of angina and heart attacks.    Also discuss risk factors for heart disease and how to make changes to improve heart health risk factors.   Functional Fitness:  -Group instruction provided by verbal instruction, demonstration, patient participation, and written materials to support subject.  Instructors address safety measures for doing things around the house.  Discuss how to get up and down off the floor, how to pick things up properly, how to safely get out of a chair without assistance, and balance training.   Meditation and Mindfulness:  -Group instruction provided by verbal instruction, patient participation, and written materials to support subject.  Instructor addresses importance of mindfulness and meditation practice to help reduce stress and improve awareness.  Instructor also leads participants through a meditation exercise.    Stretching for Flexibility and Mobility:  -Group instruction provided by verbal instruction, patient participation, and written materials to support subject.  Instructors lead participants through series of stretches that are designed to  increase flexibility thus improving mobility.  These stretches are additional exercise for major muscle groups that are typically performed during regular warm up and cool down.   Hands Only CPR:  -Group verbal, video, and participation provides a basic overview of AHA guidelines for community CPR. Role-play of emergencies allow participants the opportunity to practice calling for help and chest compression technique with discussion of AED use.   Hypertension: -Group verbal and written instruction that provides a basic overview of hypertension including the most recent diagnostic guidelines, risk factor reduction with self-care instructions and medication management.    Nutrition I class: Heart Healthy Eating:  -Group instruction provided by PowerPoint slides, verbal discussion, and written materials to support subject matter. The instructor gives an explanation and review of the Therapeutic Lifestyle Changes diet recommendations, which includes a discussion on lipid goals, dietary fat, sodium, fiber, plant stanol/sterol esters, sugar, and the components of a well-balanced, healthy diet.   Nutrition II class: Lifestyle Skills:  -Group instruction provided by PowerPoint slides, verbal discussion, and written materials to support subject matter. The instructor gives an explanation  and review of label reading, grocery shopping for heart health, heart healthy recipe modifications, and ways to make healthier choices when eating out.   Diabetes Question & Answer:  -Group instruction provided by PowerPoint slides, verbal discussion, and written materials to support subject matter. The instructor gives an explanation and review of diabetes co-morbidities, pre- and post-prandial blood glucose goals, pre-exercise blood glucose goals, signs, symptoms, and treatment of hypoglycemia and hyperglycemia, and foot care basics.   Diabetes Blitz:  -Group instruction provided by PowerPoint slides, verbal  discussion, and written materials to support subject matter. The instructor gives an explanation and review of the physiology behind type 1 and type 2 diabetes, diabetes medications and rational behind using different medications, pre- and post-prandial blood glucose recommendations and Hemoglobin A1c goals, diabetes diet, and exercise including blood glucose guidelines for exercising safely.    Portion Distortion:  -Group instruction provided by PowerPoint slides, verbal discussion, written materials, and food models to support subject matter. The instructor gives an explanation of serving size versus portion size, changes in portions sizes over the last 20 years, and what consists of a serving from each food group.   Stress Management:  -Group instruction provided by verbal instruction, video, and written materials to support subject matter.  Instructors review role of stress in heart disease and how to cope with stress positively.     Exercising on Your Own:  -Group instruction provided by verbal instruction, power point, and written materials to support subject.  Instructors discuss benefits of exercise, components of exercise, frequency and intensity of exercise, and end points for exercise.  Also discuss use of nitroglycerin and activating EMS.  Review options of places to exercise outside of rehab.  Review guidelines for sex with heart disease.   Cardiac Drugs I:  -Group instruction provided by verbal instruction and written materials to support subject.  Instructor reviews cardiac drug classes: antiplatelets, anticoagulants, beta blockers, and statins.  Instructor discusses reasons, side effects, and lifestyle considerations for each drug class.   Cardiac Drugs II:  -Group instruction provided by verbal instruction and written materials to support subject.  Instructor reviews cardiac drug classes: angiotensin converting enzyme inhibitors (ACE-I), angiotensin II receptor blockers (ARBs),  nitrates, and calcium channel blockers.  Instructor discusses reasons, side effects, and lifestyle considerations for each drug class.   Anatomy and Physiology of the Circulatory System:  Group verbal and written instruction and models provide basic cardiac anatomy and physiology, with the coronary electrical and arterial systems. Review of: AMI, Angina, Valve disease, Heart Failure, Peripheral Artery Disease, Cardiac Arrhythmia, Pacemakers, and the ICD.   Other Education:  -Group or individual verbal, written, or video instructions that support the educational goals of the cardiac rehab program.   Holiday Eating Survival Tips:  -Group instruction provided by PowerPoint slides, verbal discussion, and written materials to support subject matter. The instructor gives patients tips, tricks, and techniques to help them not only survive but enjoy the holidays despite the onslaught of food that accompanies the holidays.   Knowledge Questionnaire Score: Knowledge Questionnaire Score - 06/14/18 1438      Knowledge Questionnaire Score   Pre Score  21/24       Core Components/Risk Factors/Patient Goals at Admission: Personal Goals and Risk Factors at Admission - 06/14/18 1542      Core Components/Risk Factors/Patient Goals on Admission    Weight Management  Yes;Obesity;Weight Maintenance;Weight Loss    Intervention  Weight Management: Develop a combined nutrition and exercise program designed to reach  desired caloric intake, while maintaining appropriate intake of nutrient and fiber, sodium and fats, and appropriate energy expenditure required for the weight goal.;Weight Management: Provide education and appropriate resources to help participant work on and attain dietary goals.;Weight Management/Obesity: Establish reasonable short term and long term weight goals.;Obesity: Provide education and appropriate resources to help participant work on and attain dietary goals.    Admit Weight  175 lb 14.8  oz (79.8 kg)    Expected Outcomes  Short Term: Continue to assess and modify interventions until short term weight is achieved;Long Term: Adherence to nutrition and physical activity/exercise program aimed toward attainment of established weight goal;Weight Maintenance: Understanding of the daily nutrition guidelines, which includes 25-35% calories from fat, 7% or less cal from saturated fats, less than 200mg  cholesterol, less than 1.5gm of sodium, & 5 or more servings of fruits and vegetables daily;Weight Loss: Understanding of general recommendations for a balanced deficit meal plan, which promotes 1-2 lb weight loss per week and includes a negative energy balance of [phone] kcal/d;Understanding recommendations for meals to include 15-35% energy as protein, 25-35% energy from fat, 35-60% energy from carbohydrates, less than 200mg  of dietary cholesterol, 20-35 gm of total fiber daily;Understanding of distribution of calorie intake throughout the day with the consumption of 4-5 meals/snacks    Diabetes  Yes    Intervention  Provide education about signs/symptoms and action to take for hypo/hyperglycemia.;Provide education about proper nutrition, including hydration, and aerobic/resistive exercise prescription along with prescribed medications to achieve blood glucose in normal ranges: Fasting glucose 65-99 mg/dL    Expected Outcomes  Short Term: Participant verbalizes understanding of the signs/symptoms and immediate care of hyper/hypoglycemia, proper foot care and importance of medication, aerobic/resistive exercise and nutrition plan for blood glucose control.;Long Term: Attainment of HbA1C < 7%.    Hypertension  Yes    Intervention  Provide education on lifestyle modifcations including regular physical activity/exercise, weight management, moderate sodium restriction and increased consumption of fresh fruit, vegetables, and low fat dairy, alcohol moderation, and smoking cessation.;Monitor prescription use  compliance.    Expected Outcomes  Short Term: Continued assessment and intervention until BP is < 140/69mm HG in hypertensive participants. < 130/32mm HG in hypertensive participants with diabetes, heart failure or chronic kidney disease.;Long Term: Maintenance of blood pressure at goal levels.    Lipids  Yes    Intervention  Provide education and support for participant on nutrition & aerobic/resistive exercise along with prescribed medications to achieve LDL 70mg , HDL >40mg .    Expected Outcomes  Short Term: Participant states understanding of desired cholesterol values and is compliant with medications prescribed. Participant is following exercise prescription and nutrition guidelines.;Long Term: Cholesterol controlled with medications as prescribed, with individualized exercise RX and with personalized nutrition plan. Value goals: LDL < 70mg , HDL > 40 mg.    Stress  Yes    Intervention  Offer individual and/or small group education and counseling on adjustment to heart disease, stress management and health-related lifestyle change. Teach and support self-help strategies.;Refer participants experiencing significant psychosocial distress to appropriate mental health specialists for further evaluation and treatment. When possible, include family members and significant others in education/counseling sessions.    Expected Outcomes  Short Term: Participant demonstrates changes in health-related behavior, relaxation and other stress management skills, ability to obtain effective social support, and compliance with psychotropic medications if prescribed.;Long Term: Emotional wellbeing is indicated by absence of clinically significant psychosocial distress or social isolation.       Core Components/Risk Factors/Patient Goals  Review:  Goals and Risk Factor Review    Row Name 06/23/18 1342             Core Components/Risk Factors/Patient Goals Review   Personal Goals Review  Weight  Management/Obesity;Lipids;Diabetes;Hypertension       Review  Barabara started exercise at cardiac rehab this week. Barabara's vital signs and CBG's have been stable so far. Georgine does not check her cBG's at home.       Expected Outcomes  Patient will participate in cardiac rehab for exercise, diet and lifestyle modifcations. Will encourage Shaena to lear how to check her own CBG's so she can check her CBg's at home.          Core Components/Risk Factors/Patient Goals at Discharge (Final Review):  Goals and Risk Factor Review - 06/23/18 1342      Core Components/Risk Factors/Patient Goals Review   Personal Goals Review  Weight Management/Obesity;Lipids;Diabetes;Hypertension    Review  Barabara started exercise at cardiac rehab this week. Barabara's vital signs and CBG's have been stable so far. Suzanna does not check her cBG's at home.    Expected Outcomes  Patient will participate in cardiac rehab for exercise, diet and lifestyle modifcations. Will encourage Anne to lear how to check her own CBG's so she can check her CBg's at home.       ITP Comments: ITP Comments    Row Name 06/14/18 1435 06/22/18 1446         ITP Comments  Dr.Traci Radford Pax, Medical Director   30 day ITP Review. Chivonne started exercise at cardiac rehab on Monday 06/20/18         Comments: Wyllow started cardiac rehab on 06/20/18.  Pt tolerated light exercise without difficulty. VSS, telemetry-Sinus Rhythm, first degree heart block, asymptomatic.  Medication list reconciled. Pt denies barriers to medicaiton compliance.  PSYCHOSOCIAL ASSESSMENT:  PHQ-0. Pt exhibits positive coping skills, hopeful outlook with supportive family. No psychosocial needs identified at this time, no psychosocial interventions necessary.    Pt enjoys reading and walking.   Pt oriented to exercise equipment and routine.    Understanding verbalized. Jacquette refused to allow me to recheck her CBG post exercise. Patient says she does not check  her CBG's at home.Will continue to monitor the patient throughout  the program.Maria [name], RN,BSN 06/23/2018 1:48 PM

## 2018-06-22 ENCOUNTER — Ambulatory Visit (HOSPITAL_COMMUNITY): Payer: Medicare Other

## 2018-06-22 ENCOUNTER — Encounter (HOSPITAL_COMMUNITY)
Admission: RE | Admit: 2018-06-22 | Discharge: 2018-06-22 | Disposition: A | Discharge status: Home / Self Care | Payer: Medicare Other | Source: Ambulatory Visit | Attending: Cardiology | Admitting: Cardiology

## 2018-06-22 DIAGNOSIS — I214 Non-ST elevation (NSTEMI) myocardial infarction: Secondary | ICD-10-CM

## 2018-06-22 DIAGNOSIS — Z955 Presence of coronary angioplasty implant and graft: Secondary | ICD-10-CM

## 2018-06-22 LAB — GLUCOSE, CAPILLARY: GLUCOSE-CAPILLARY: 114 mg/dL — AB (ref 70–99)

## 2018-06-24 ENCOUNTER — Encounter (HOSPITAL_COMMUNITY)
Admission: RE | Admit: 2018-06-24 | Discharge: 2018-06-24 | Disposition: A | Discharge status: Home / Self Care | Payer: Medicare Other | Source: Ambulatory Visit | Attending: Cardiology | Admitting: Cardiology

## 2018-06-24 ENCOUNTER — Ambulatory Visit (HOSPITAL_COMMUNITY): Payer: Medicare Other

## 2018-06-24 DIAGNOSIS — I214 Non-ST elevation (NSTEMI) myocardial infarction: Secondary | ICD-10-CM | POA: Diagnosis not present

## 2018-06-24 DIAGNOSIS — Z955 Presence of coronary angioplasty implant and graft: Secondary | ICD-10-CM

## 2018-06-24 LAB — GLUCOSE, CAPILLARY: GLUCOSE-CAPILLARY: 117 mg/dL — AB (ref 70–99)

## 2018-06-27 ENCOUNTER — Encounter (HOSPITAL_COMMUNITY)
Admission: RE | Admit: 2018-06-27 | Discharge: 2018-06-27 | Disposition: A | Discharge status: Home / Self Care | Payer: Medicare Other | Source: Ambulatory Visit | Attending: Cardiology | Admitting: Cardiology

## 2018-06-27 ENCOUNTER — Ambulatory Visit (HOSPITAL_COMMUNITY): Payer: Medicare Other

## 2018-06-27 DIAGNOSIS — Z955 Presence of coronary angioplasty implant and graft: Secondary | ICD-10-CM

## 2018-06-27 DIAGNOSIS — I214 Non-ST elevation (NSTEMI) myocardial infarction: Secondary | ICD-10-CM | POA: Diagnosis not present

## 2018-06-27 LAB — GLUCOSE, CAPILLARY: Glucose-Capillary: 122 mg/dL — ABNORMAL HIGH (ref 70–99)

## 2018-06-29 ENCOUNTER — Encounter (HOSPITAL_COMMUNITY)
Admission: RE | Admit: 2018-06-29 | Discharge: 2018-06-29 | Disposition: A | Discharge status: Home / Self Care | Payer: Medicare Other | Source: Ambulatory Visit | Attending: Cardiology | Admitting: Cardiology

## 2018-06-29 ENCOUNTER — Ambulatory Visit (HOSPITAL_COMMUNITY): Payer: Medicare Other

## 2018-06-29 DIAGNOSIS — I214 Non-ST elevation (NSTEMI) myocardial infarction: Secondary | ICD-10-CM | POA: Diagnosis not present

## 2018-06-29 DIAGNOSIS — Z955 Presence of coronary angioplasty implant and graft: Secondary | ICD-10-CM

## 2018-06-29 LAB — GLUCOSE, CAPILLARY: GLUCOSE-CAPILLARY: 128 mg/dL — AB (ref 70–99)

## 2018-07-01 ENCOUNTER — Ambulatory Visit (HOSPITAL_COMMUNITY): Payer: Medicare Other

## 2018-07-01 ENCOUNTER — Encounter (HOSPITAL_COMMUNITY): Payer: Medicare Other

## 2018-07-04 ENCOUNTER — Encounter (HOSPITAL_COMMUNITY)
Admission: RE | Admit: 2018-07-04 | Discharge: 2018-07-04 | Disposition: A | Discharge status: Home / Self Care | Payer: Medicare Other | Source: Ambulatory Visit | Attending: Cardiology | Admitting: Cardiology

## 2018-07-04 ENCOUNTER — Ambulatory Visit (HOSPITAL_COMMUNITY): Payer: Medicare Other

## 2018-07-04 DIAGNOSIS — I214 Non-ST elevation (NSTEMI) myocardial infarction: Secondary | ICD-10-CM | POA: Insufficient documentation

## 2018-07-04 DIAGNOSIS — Z955 Presence of coronary angioplasty implant and graft: Secondary | ICD-10-CM

## 2018-07-04 LAB — GLUCOSE, CAPILLARY
GLUCOSE-CAPILLARY: 88 mg/dL (ref 70–99)
GLUCOSE-CAPILLARY: 104 mg/dL — AB (ref 70–99)

## 2018-07-04 NOTE — Progress Notes (Signed)
Pt presented with low blood sugar today ~88 mg/dL. Discussed with patient that she needs to eat regularly across the day and eat a snack before exercise to ensure her blood glucose levels are in a normal range. Per pt report she slept in until 11:30 this morning d/t being up late at night with pain, pt suspects pain d/t diabetic neuropathy. Pt reports not checking her blood sugar, and refuses to check because she finds it extremely painful to do finger pricks for blood. Reviewed the benefits of monitoring blood glucose daily with patient and discussed the benefits of a continuous blood glucose monitor with patient. Recommended pt call medical provider today to set up appointment for new monitor that she will use regularly. Additionally set up regular eating times with patient to help ensure blood sugar will not fall too low. Reviewed with patient what a serving of carbohydrates looks like, and discussed quick and easy snacks she could have before working out to ensure blood sugar does not fall too low before or during exercise. Pt verbalized understanding via feedback method.

## 2018-07-04 NOTE — Progress Notes (Signed)
Incomplete Session Note  Patient Details  Name: Kristine Oliver MRN: [id number] Date of Birth: 1931-05-09 Referring Provider:     CARDIAC REHAB PHASE II ORIENTATION from 06/14/2018 in [facility]  Referring Provider  Dr. Van Clines [name] did not complete her rehab session.  CBG 88. Keyry said she ate lunch around 12:30. Patient ate a granola bar and was given a Ginger ale and was counseled by the dietitian. Recheck CBG 104. Jenan did not exercise and plans to return to exercise on Wednesday. Barnet Pall, RN,BSN 07/04/2018 4:42 PM

## 2018-07-06 ENCOUNTER — Ambulatory Visit (HOSPITAL_COMMUNITY): Payer: Medicare Other

## 2018-07-06 ENCOUNTER — Encounter (HOSPITAL_COMMUNITY)
Admission: RE | Admit: 2018-07-06 | Discharge: 2018-07-06 | Disposition: A | Discharge status: Home / Self Care | Payer: Medicare Other | Source: Ambulatory Visit | Attending: Cardiology | Admitting: Cardiology

## 2018-07-06 DIAGNOSIS — Z955 Presence of coronary angioplasty implant and graft: Secondary | ICD-10-CM

## 2018-07-06 DIAGNOSIS — I214 Non-ST elevation (NSTEMI) myocardial infarction: Secondary | ICD-10-CM

## 2018-07-06 NOTE — Progress Notes (Signed)
Incomplete Session Note  Patient Details  Name: Kristine Oliver MRN: [id number] Date of Birth: April 12, 1931 Referring Provider:     CARDIAC REHAB PHASE II ORIENTATION from 06/14/2018 in [place]  Referring Provider  Dr. Van Clines [name] did not complete her rehab session.  Patient reported feeling lightheaded and nauseated this afternoon. Blood pressure 144/58 sitting. Standing blood pressure 136/60. Heart rate 65.CBG 117 Patient denied having any chest pain but reports she has been having a hard time managing her chronic back pain and has a vaginal cyst she wants her gynecologist to evaluate. Oxygen saturation 100% on room air. Berenice Bouton NP called and notified. Sharee Pimple advised that Kristine Oliver follow up with her primary care physician before she returns to exercise. Patient states understanding.Barnet Pall, RN,BSN 07/06/2018 4:53 PM

## 2018-07-07 ENCOUNTER — Telehealth (HOSPITAL_COMMUNITY): Payer: Self-pay | Admitting: *Deleted

## 2018-07-07 LAB — GLUCOSE, CAPILLARY: Glucose-Capillary: 117 mg/dL — ABNORMAL HIGH (ref 70–99)

## 2018-07-08 ENCOUNTER — Ambulatory Visit (HOSPITAL_COMMUNITY): Payer: Medicare Other

## 2018-07-08 ENCOUNTER — Encounter (HOSPITAL_COMMUNITY): Admission: RE | Admit: 2018-07-08 | Payer: Medicare Other | Source: Ambulatory Visit

## 2018-07-11 ENCOUNTER — Encounter (HOSPITAL_COMMUNITY)
Admission: RE | Admit: 2018-07-11 | Discharge: 2018-07-11 | Disposition: A | Discharge status: Home / Self Care | Payer: Medicare Other | Source: Ambulatory Visit | Attending: Cardiology | Admitting: Cardiology

## 2018-07-11 ENCOUNTER — Ambulatory Visit (HOSPITAL_COMMUNITY): Payer: Medicare Other

## 2018-07-11 DIAGNOSIS — Z955 Presence of coronary angioplasty implant and graft: Secondary | ICD-10-CM

## 2018-07-11 DIAGNOSIS — I214 Non-ST elevation (NSTEMI) myocardial infarction: Secondary | ICD-10-CM

## 2018-07-11 LAB — GLUCOSE, CAPILLARY: Glucose-Capillary: 90 mg/dL (ref 70–99)

## 2018-07-11 NOTE — Progress Notes (Signed)
Reviewed home exercise guidelines with patient including endpoints, temperature precautions, target heart rate and rate of perceived exertion. Pt plans to walk as her mode of home exercise. Pt voices understanding of instructions given. Jonnelle Lawniczak M Kaiulani Sitton, MS, ACSM CEP  

## 2018-07-12 NOTE — Progress Notes (Signed)
Patient returned to exercise and exercised without difficulty.Barnet Pall, RN,BSN 07/12/2018 10:43 AM

## 2018-07-13 ENCOUNTER — Ambulatory Visit (HOSPITAL_COMMUNITY): Payer: Medicare Other

## 2018-07-13 ENCOUNTER — Encounter (HOSPITAL_COMMUNITY)
Admission: RE | Admit: 2018-07-13 | Discharge: 2018-07-13 | Disposition: A | Discharge status: Home / Self Care | Payer: Medicare Other | Source: Ambulatory Visit | Attending: Cardiology | Admitting: Cardiology

## 2018-07-13 DIAGNOSIS — I214 Non-ST elevation (NSTEMI) myocardial infarction: Secondary | ICD-10-CM | POA: Diagnosis not present

## 2018-07-13 DIAGNOSIS — Z955 Presence of coronary angioplasty implant and graft: Secondary | ICD-10-CM

## 2018-07-13 LAB — GLUCOSE, CAPILLARY: GLUCOSE-CAPILLARY: 124 mg/dL — AB (ref 70–99)

## 2018-07-14 NOTE — Progress Notes (Signed)
Cardiac Individual Treatment Plan  Patient Details  Name: Kristine Oliver MRN: [id number] Date of Birth: 1931-06-02 Referring Provider:     CARDIAC REHAB PHASE II ORIENTATION from 06/14/2018 in [place]  Referring Provider  Dr. Marlou Porch      Initial Encounter Date:    CARDIAC REHAB PHASE II ORIENTATION from 06/14/2018 in [place]  Date  06/14/18      Visit Diagnosis: 08/16/19Stented coronary artery  08/16/2019NSTEMI (non-ST elevated myocardial infarction) [facility])  Patient's Home Medications on Admission:  Current Outpatient Medications:  .  ALPRAZolam (XANAX) 0.25 MG tablet, Take 0.25 mg by mouth 3 (three) times daily as needed. anxiety, Disp: , Rfl:  .  amLODipine (NORVASC) 5 MG tablet, Take 1 tablet (5 mg total) by mouth daily., Disp: 30 tablet, Rfl: 3 .  antiseptic oral rinse (BIOTENE) LIQD, 15 mLs by Mouth Rinse route as needed for dry mouth., Disp: , Rfl:  .  aspirin 81 MG tablet, Take 81 mg by mouth daily., Disp: , Rfl:  .  clindamycin (CLEOCIN T) 1 % lotion, Apply 1 application topically daily., Disp: , Rfl:  .  clopidogrel (PLAVIX) 75 MG tablet, Take 1 tablet (75 mg total) by mouth daily., Disp: 90 tablet, Rfl: 3 .  cycloSPORINE (RESTASIS) 0.05 % ophthalmic emulsion, Place 1 drop into both eyes as needed. , Disp: , Rfl:  .  estrogen-methylTESTOSTERone 0.625-1.25 MG per tablet, Take 1 tablet by mouth daily., Disp: , Rfl:  .  furosemide (LASIX) 20 MG tablet, Take 1 tablet (20 mg total) by mouth daily., Disp: 90 tablet, Rfl: 3 .  gabapentin (NEURONTIN) 300 MG capsule, Take 300 mg by mouth 2 (two) times daily. , Disp: , Rfl:  .  isosorbide mononitrate (IMDUR) 30 MG 24 hr tablet, Take 1 tablet (30 mg total) by mouth daily., Disp: 30 tablet, Rfl: 3 .  levothyroxine (SYNTHROID, LEVOTHROID) 75 MCG tablet, Take 75 mcg by mouth daily., Disp: , Rfl:  .  metFORMIN (GLUCOPHAGE) 500 MG tablet, Take 1 tablet by mouth 2 (two)  times daily., Disp: , Rfl: 0 .  nitroGLYCERIN (NITROSTAT) 0.4 MG SL tablet, Place 1 tablet (0.4 mg total) under the tongue every 5 (five) minutes x 3 doses as needed for chest pain., Disp: 25 tablet, Rfl: 3 .  oxyCODONE (OXY IR/ROXICODONE) 5 MG immediate release tablet, Take 5 mg by mouth at bedtime., Disp: , Rfl:  .  Oxycodone HCl 10 MG TABS, Take 1 tablet by mouth 4 (four) times daily., Disp: , Rfl: 0 .  pantoprazole (PROTONIX) 40 MG tablet, Take 1 tablet (40 mg total) by mouth daily., Disp: 90 tablet, Rfl: 3 .  PREMARIN vaginal cream, Place 1 Applicatorful vaginally once a week. , Disp: , Rfl:  .  ramipril (ALTACE) 5 MG capsule, Take 1 capsule (5 mg total) by mouth daily., Disp: 30 capsule, Rfl: 3 .  rosuvastatin (CRESTOR) 20 MG tablet, Take 1 tablet (20 mg total) by mouth daily at 6 PM., Disp: 90 tablet, Rfl: 3 .  valACYclovir (VALTREX) 500 MG tablet, Take 500 mg by mouth 2 (two) times daily. , Disp: , Rfl:  .  vitamin B-12 (CYANOCOBALAMIN) 1000 MCG tablet, Take 1,000 mcg by mouth daily., Disp: , Rfl:  .  zolpidem (AMBIEN) 10 MG tablet, Take 10 mg by mouth at bedtime as needed. , Disp: , Rfl:   Past Medical History: Past Medical History:  Diagnosis Date  . Bipolar affective disorder ([place])   .  Bradycardia   . CAD (coronary artery disease)    a. 08/2004 NSTEMI/PCI: LAD 54m/d (2.75 x 28 mm Taxus 2 DES);  b. 10/2010 Cath: LM nl, LAD patent stents w/ jailed septal, LCX 30, RCA 30; c. 06/2013 MV: No ischemia. Small fixed anteroseptal defect, ? scar vs atten. EF 62%; d. 03/2016 MV: EF 57%, Low risk.  . Chronic diastolic heart failure ([place])    a. Echo 7/13:  mod LVH, EF 65-70%, vigorous LV, Gr 1 diast dysfn, mild LAE; b. 05/2017 Echo: EF 60-65%, mod LVH, no rwma, Gr1 DD, mildly dil LA.  Marland Kitchen Chronic pain disorder   . CKD (chronic kidney disease), stage III ([place])   . DDD (degenerative disc disease), lumbosacral    , Spinal stenosis  . Diabetes mellitus ([place])    , Type II  . GERD (gastroesophageal  reflux disease)   . HTN (hypertension)   . Hyperlipidemia   . Hyperthyroidism   . IBS (irritable bowel syndrome)   . Lumbar disc disease   . Nonproliferative diabetic retinopathy associated with type 2 diabetes mellitus ([place])   . Obesity (BMI 30-39.9)   . Peripheral neuropathy    , Associated with DM  . Scoliosis     Tobacco Use: Social History   Tobacco Use  Smoking Status Former Smoker  . Years: 35.00  Smokeless Tobacco Never Used    Labs: Recent Chemical engineer    Labs for ITP Cardiac and Pulmonary Rehab Latest Ref Rng & Units 10/26/2010 02/13/2012 05/02/2012 05/19/2016 03/19/2018   Cholestrol 0 - 200 mg/dL 137 ATP III CLASSIFICATION: <200     mg/dL   Desirable 200-239  mg/dL   Borderline High >=240    mg/dL   High - 155 162 142   LDLCALC 0 - 99 mg/dL 65 Total Cholesterol/HDL:CHD Risk Coronary Heart Disease Risk Table Men   Women 1/2 Average Risk   3.4   3.3 Average Risk       5.0   4.4 2 X Average Risk   9.6   7.1 3 X Average Risk  23.4   11.0 Use the calculated Patient Ratio above and the CHD Risk Table to determine the patient's CHD Risk. ATP III CLASSIFICATION (LDL): <100     mg/dL   Optimal 100-129  mg/dL   Near or Above Optimal 130-159  mg/dL   Borderline 160-189  mg/dL   High >190     mg/dL   Very High - 80 103(H) 91   HDL >40 mg/dL 38(L) - 28(L) 31(L) 30(L)   Trlycerides <150 mg/dL 170(H) - 233(H) 138 103   Hemoglobin A1c <5.7 % 6.8 (NOTE)                                                                       According to the ADA Clinical Practice Recommendations for 2011, when HbA1c is used as a screening test:   >=6.5%   Diagnostic of Diabetes Mellitus           (if abnormal result is confirmed)  5.7-6.4%   Increased risk of developing Diabetes Mellitus  References:Diagnosis and Classification of Diabetes Mellitus,Diabetes [id number],9417,40(CXKGY 1):S62-S69 and Standards of Medical Care in         Diabetes -  2011,Diabetes VHQI,6962,95 (Suppl  1):S11-S61.(H) 6.7(H) - - -   TCO2 0 - 100 mmol/L 21 - - - -      Capillary Blood Glucose: Lab Results  Component Value Date   GLUCAP 124 (H) 07/13/2018   GLUCAP 90 07/11/2018   GLUCAP 117 (H) 07/06/2018   GLUCAP 104 (H) 07/04/2018   GLUCAP 88 07/04/2018     Exercise Target Goals: Exercise Program Goal: Individual exercise prescription set using results from initial 6 min walk test and THRR while considering  patient's activity barriers and safety.   Exercise Prescription Goal: Initial exercise prescription builds to 30-45 minutes a day of aerobic activity, 2-3 days per week.  Home exercise guidelines will be given to patient during program as part of exercise prescription that the participant will acknowledge.  Activity Barriers & Risk Stratification: Activity Barriers & Cardiac Risk Stratification - 06/14/18 1538      Activity Barriers & Cardiac Risk Stratification   Activity Barriers  Deconditioning;Muscular Weakness;Back Problems;Neck/Spine Problems;Assistive Device;Balance Concerns    Cardiac Risk Stratification  High       6 Minute Walk: 6 Minute Walk    Row Name 06/14/18 1537         6 Minute Walk   Phase  Initial     Distance  800 feet     Walk Time  6 minutes     # of Rest Breaks  2     MPH  1.51     METS  0.42     RPE  13     VO2 Peak  1.48     Symptoms  Yes (comment)     Comments  Fatigue     Resting HR  64 bpm     Resting BP  114/70     Resting Oxygen Saturation   96 %     Max Ex. HR  81 bpm     Max Ex. BP  118/70     2 Minute Post BP  122/68        Oxygen Initial Assessment:   Oxygen Re-Evaluation:   Oxygen Discharge (Final Oxygen Re-Evaluation):   Initial Exercise Prescription: Initial Exercise Prescription - 06/14/18 1600      Date of Initial Exercise RX and Referring Provider   Date  06/14/18    Referring Provider  Dr. Marlou Porch    Expected Discharge Date  09/23/18      NuStep   Level  2    SPM  75    Minutes  10    METs  1       Arm Ergometer   Level  1    Minutes  10    METs  1.3      Track   Laps  6    Minutes  10    METs  2      Prescription Details   Frequency (times per week)  3    Duration  Progress to 30 minutes of continuous aerobic without signs/symptoms of physical distress      Intensity   THRR 40-80% of Max Heartrate  54-108    Ratings of Perceived Exertion  11-13      Progression   Progression  Continue to progress workloads to maintain intensity without signs/symptoms of physical distress.      Resistance Training   Training Prescription  Yes    Weight  2 lbs.    Reps  10-15       Perform Capillary  Blood Glucose checks as needed.  Exercise Prescription Changes: Exercise Prescription Changes    Row Name 06/20/18 1504 07/11/18 1456           Response to Exercise   Blood Pressure (Admit)  120/70  118/78      Blood Pressure (Exercise)  118/84  122/74      Blood Pressure (Exit)  110/70  128/70      Heart Rate (Admit)  73 bpm  95 bpm      Heart Rate (Exercise)  74 bpm  119 bpm      Heart Rate (Exit)  73 bpm  94 bpm      Rating of Perceived Exertion (Exercise)  13  13      Symptoms  none  none      Duration  Progress to 30 minutes of  aerobic without signs/symptoms of physical distress  Progress to 30 minutes of  aerobic without signs/symptoms of physical distress      Intensity  THRR unchanged  THRR unchanged        Progression   Progression  Continue to progress workloads to maintain intensity without signs/symptoms of physical distress.  Continue to progress workloads to maintain intensity without signs/symptoms of physical distress.      Average METs  1.9  1.9        Resistance Training   Training Prescription  Yes  Yes      Weight  2 lbs.  2 lbs.      Reps  10-15  10-15      Time  10 Minutes  10 Minutes        Interval Training   Interval Training  No  No        NuStep   Level  2  2      SPM  75  75      Minutes  10  20      METs  1.7  1.9        Arm  Ergometer   Level  1  -      Minutes  10  -        Track   Laps  6  6      Minutes  10  10      METs  2.05  2.05        Home Exercise Plan   Plans to continue exercise at  -  Home (comment) Walking      Frequency  -  Add 1 additional day to program exercise sessions.      Initial Home Exercises Provided  -  07/11/18         Exercise Comments: Exercise Comments    Row Name 06/20/18 1554 07/11/18 1520         Exercise Comments  Patient able to tolerate low intensity exercise, increase workloads as tolerated.  Reviewed home exercise guidelines, METs, and goals with patient.         Exercise Goals and Review: Exercise Goals    Row Name 06/14/18 1539             Exercise Goals   Increase Physical Activity  Yes       Intervention  Provide advice, education, support and counseling about physical activity/exercise needs.;Develop an individualized exercise prescription for aerobic and resistive training based on initial evaluation findings, risk stratification, comorbidities and participant's personal goals.       Expected Outcomes  Short Term: Attend rehab  on a regular basis to increase amount of physical activity.       Increase Strength and Stamina  Yes       Intervention  Provide advice, education, support and counseling about physical activity/exercise needs.;Develop an individualized exercise prescription for aerobic and resistive training based on initial evaluation findings, risk stratification, comorbidities and participant's personal goals.       Expected Outcomes  Short Term: Increase workloads from initial exercise prescription for resistance, speed, and METs.       Able to understand and use rate of perceived exertion (RPE) scale  Yes       Intervention  Provide education and explanation on how to use RPE scale       Expected Outcomes  Short Term: Able to use RPE daily in rehab to express subjective intensity level;Long Term:  Able to use RPE to guide intensity level  when exercising independently       Knowledge and understanding of Target Heart Rate Range (THRR)  Yes       Intervention  Provide education and explanation of THRR including how the numbers were predicted and where they are located for reference       Expected Outcomes  Short Term: Able to state/look up THRR;Long Term: Able to use THRR to govern intensity when exercising independently;Short Term: Able to use daily as guideline for intensity in rehab       Able to check pulse independently  Yes       Intervention  Provide education and demonstration on how to check pulse in carotid and radial arteries.;Review the importance of being able to check your own pulse for safety during independent exercise       Expected Outcomes  Short Term: Able to explain why pulse checking is important during independent exercise;Long Term: Able to check pulse independently and accurately       Understanding of Exercise Prescription  Yes       Intervention  Provide education, explanation, and written materials on patient's individual exercise prescription       Expected Outcomes  Short Term: Able to explain program exercise prescription;Long Term: Able to explain home exercise prescription to exercise independently          Exercise Goals Re-Evaluation : Exercise Goals Re-Evaluation    Row Name 06/20/18 1554 07/11/18 1520           Exercise Goal Re-Evaluation   Exercise Goals Review  Able to understand and use rate of perceived exertion (RPE) scale;Increase Physical Activity  Able to understand and use rate of perceived exertion (RPE) scale;Increase Physical Activity;Understanding of Exercise Prescription;Knowledge and understanding of Target Heart Rate Range (THRR)      Comments  Patient able to understand and use RPE scale appropriately.  Reviewed home exercise guidelines with patient including THRR, RPE scale, and endpoints for exercise. Pt states that she does some walking.      Expected Outcomes  Increase  workloads as tolerated to help improve cardiorespiratory fitness.  Increase walking duration at home to help increase strength and stamina.         Discharge Exercise Prescription (Final Exercise Prescription Changes): Exercise Prescription Changes - 07/11/18 1456      Response to Exercise   Blood Pressure (Admit)  118/78    Blood Pressure (Exercise)  122/74    Blood Pressure (Exit)  128/70    Heart Rate (Admit)  95 bpm    Heart Rate (Exercise)  119 bpm  Heart Rate (Exit)  94 bpm    Rating of Perceived Exertion (Exercise)  13    Symptoms  none    Duration  Progress to 30 minutes of  aerobic without signs/symptoms of physical distress    Intensity  THRR unchanged      Progression   Progression  Continue to progress workloads to maintain intensity without signs/symptoms of physical distress.    Average METs  1.9      Resistance Training   Training Prescription  Yes    Weight  2 lbs.    Reps  10-15    Time  10 Minutes      Interval Training   Interval Training  No      NuStep   Level  2    SPM  75    Minutes  20    METs  1.9      Track   Laps  6    Minutes  10    METs  2.05      Home Exercise Plan   Plans to continue exercise at  Home (comment)   Walking   Frequency  Add 1 additional day to program exercise sessions.    Initial Home Exercises Provided  07/11/18       Nutrition:  Target Goals: Understanding of nutrition guidelines, daily intake of sodium 1500mg , cholesterol 200mg , calories 30% from fat and 7% or less from saturated fats, daily to have 5 or more servings of fruits and vegetables.  Biometrics: Pre Biometrics - 06/14/18 1540      Pre Biometrics   Height  4\' 11"  (1.499 m)    Weight  79.8 kg    BMI (Calculated)  35.51        Nutrition Therapy Plan and Nutrition Goals: Nutrition Therapy & Goals - 06/14/18 1629      Nutrition Therapy   Diet  general healthful      Personal Nutrition Goals   Nutrition Goal  Pt to continue to identify  and limit food sources of saturated fat, trans fat, refined carbohydrates and sodium    Personal Goal #2  Pt to watch out for sweets and added sugar.      Intervention Plan   Intervention  Prescribe, educate and counsel regarding individualized specific dietary modifications aiming towards targeted core components such as weight, hypertension, lipid management, diabetes, heart failure and other comorbidities.    Expected Outcomes  Short Term Goal: A plan has been developed with personal nutrition goals set during dietitian appointment.       Nutrition Assessments: Nutrition Assessments - 06/14/18 1630      MEDFICTS Scores   Pre Score  28       Nutrition Goals Re-Evaluation: Nutrition Goals Re-Evaluation    Row Name 06/14/18 1629             Goals   Current Weight  173 lb 15.1 oz (78.9 kg)          Nutrition Goals Re-Evaluation: Nutrition Goals Re-Evaluation    Row Name 06/14/18 1629             Goals   Current Weight  173 lb 15.1 oz (78.9 kg)          Nutrition Goals Discharge (Final Nutrition Goals Re-Evaluation): Nutrition Goals Re-Evaluation - 06/14/18 1629      Goals   Current Weight  173 lb 15.1 oz (78.9 kg)       Psychosocial: Target Goals: Acknowledge presence or  absence of significant depression and/or stress, maximize coping skills, provide positive support system. Participant is able to verbalize types and ability to use techniques and skills needed for reducing stress and depression.  Initial Review & Psychosocial Screening: Initial Psych Review & Screening - 07/14/18 1555      Initial Review   Current issues with  Current Stress Concerns    Source of Stress Concerns  Chronic Illness      Family Dynamics   Good Support System?  Yes   Marland Mcalpine has her son, daughter in law and nephew for support     Barriers   Psychosocial barriers to participate in program  There are no identifiable barriers or psychosocial needs.      Screening  Interventions   Interventions  Encouraged to exercise    Expected Outcomes  Short Term goal: Utilizing psychosocial counselor, staff and physician to assist with identification of specific Stressors or current issues interfering with healing process. Setting desired goal for each stressor or current issue identified.;Long Term Goal: Stressors or current issues are controlled or eliminated.;Short Term goal: Identification and review with participant of any Quality of Life or Depression concerns found by scoring the questionnaire.       Quality of Life Scores: Quality of Life - 06/14/18 1438      Quality of Life   Select  Quality of Life      Quality of Life Scores   Health/Function Pre  15.7 %    Socioeconomic Pre  22.8 %    Psych/Spiritual Pre  24.4 %    Family Pre  24.6 %    GLOBAL Pre  20.36 %      Scores of 19 and below usually indicate a poorer quality of life in these areas.  A difference of  2-3 points is a clinically meaningful difference.  A difference of 2-3 points in the total score of the Quality of Life Index has been associated with significant improvement in overall quality of life, self-image, physical symptoms, and general health in studies assessing change in quality of life.  PHQ-9: Recent Review Flowsheet Data    Depression screen [facility] 2/9 06/20/2018   Decreased Interest 0   Down, Depressed, Hopeless 0   PHQ - 2 Score 0     Interpretation of Total Score  Total Score Depression Severity:  1-4 = Minimal depression, 5-9 = Mild depression, 10-14 = Moderate depression, 15-19 = Moderately severe depression, 20-27 = Severe depression   Psychosocial Evaluation and Intervention:   Psychosocial Re-Evaluation: Psychosocial Re-Evaluation    [place] Name 07/14/18 1555 07/14/18 1558           Psychosocial Re-Evaluation   Current issues with  Current Stress Concerns  Current Stress Concerns      Interventions  -  Stress management education;Encouraged to attend Cardiac  Rehabilitation for the exercise        Initial Review   Source of Stress Concerns  -  Chronic Illness         Psychosocial Discharge (Final Psychosocial Re-Evaluation): Psychosocial Re-Evaluation - 07/14/18 1558      Psychosocial Re-Evaluation   Current issues with  Current Stress Concerns    Interventions  Stress management education;Encouraged to attend Cardiac Rehabilitation for the exercise      Initial Review   Source of Stress Concerns  Chronic Illness       Vocational Rehabilitation: Provide vocational rehab assistance to qualifying candidates.   Vocational Rehab Evaluation & Intervention:   Education:  Education Goals: Education classes will be provided on a weekly basis, covering required topics. Participant will state understanding/return demonstration of topics presented.  Learning Barriers/Preferences: Learning Barriers/Preferences - 06/14/18 1542      Learning Barriers/Preferences   Learning Barriers  Sight    Learning Preferences  Audio;Group Instruction;Individual Instruction;Pictoral;Skilled Demonstration;Verbal Instruction;Video;Written Material       Education Topics: Count Your Pulse:  -Group instruction provided by verbal instruction, demonstration, patient participation and written materials to support subject.  Instructors address importance of being able to find your pulse and how to count your pulse when at home without a heart monitor.  Patients get hands on experience counting their pulse with staff help and individually.   Heart Attack, Angina, and Risk Factor Modification:  -Group instruction provided by verbal instruction, video, and written materials to support subject.  Instructors address signs and symptoms of angina and heart attacks.    Also discuss risk factors for heart disease and how to make changes to improve heart health risk factors.   Functional Fitness:  -Group instruction provided by verbal instruction, demonstration, patient  participation, and written materials to support subject.  Instructors address safety measures for doing things around the house.  Discuss how to get up and down off the floor, how to pick things up properly, how to safely get out of a chair without assistance, and balance training.   Meditation and Mindfulness:  -Group instruction provided by verbal instruction, patient participation, and written materials to support subject.  Instructor addresses importance of mindfulness and meditation practice to help reduce stress and improve awareness.  Instructor also leads participants through a meditation exercise.    Stretching for Flexibility and Mobility:  -Group instruction provided by verbal instruction, patient participation, and written materials to support subject.  Instructors lead participants through series of stretches that are designed to increase flexibility thus improving mobility.  These stretches are additional exercise for major muscle groups that are typically performed during regular warm up and cool down.   Hands Only CPR:  -Group verbal, video, and participation provides a basic overview of AHA guidelines for community CPR. Role-play of emergencies allow participants the opportunity to practice calling for help and chest compression technique with discussion of AED use.   Hypertension: -Group verbal and written instruction that provides a basic overview of hypertension including the most recent diagnostic guidelines, risk factor reduction with self-care instructions and medication management.    Nutrition I class: Heart Healthy Eating:  -Group instruction provided by PowerPoint slides, verbal discussion, and written materials to support subject matter. The instructor gives an explanation and review of the Therapeutic Lifestyle Changes diet recommendations, which includes a discussion on lipid goals, dietary fat, sodium, fiber, plant stanol/sterol esters, sugar, and the components of  a well-balanced, healthy diet.   Nutrition II class: Lifestyle Skills:  -Group instruction provided by PowerPoint slides, verbal discussion, and written materials to support subject matter. The instructor gives an explanation and review of label reading, grocery shopping for heart health, heart healthy recipe modifications, and ways to make healthier choices when eating out.   Diabetes Question & Answer:  -Group instruction provided by PowerPoint slides, verbal discussion, and written materials to support subject matter. The instructor gives an explanation and review of diabetes co-morbidities, pre- and post-prandial blood glucose goals, pre-exercise blood glucose goals, signs, symptoms, and treatment of hypoglycemia and hyperglycemia, and foot care basics.   Diabetes Blitz:  -Group instruction provided by [employer], verbal discussion, and written materials to  support subject matter. The instructor gives an explanation and review of the physiology behind type 1 and type 2 diabetes, diabetes medications and rational behind using different medications, pre- and post-prandial blood glucose recommendations and Hemoglobin A1c goals, diabetes diet, and exercise including blood glucose guidelines for exercising safely.    Portion Distortion:  -Group instruction provided by PowerPoint slides, verbal discussion, written materials, and food models to support subject matter. The instructor gives an explanation of serving size versus portion size, changes in portions sizes over the last 20 years, and what consists of a serving from each food group.   Stress Management:  -Group instruction provided by verbal instruction, video, and written materials to support subject matter.  Instructors review role of stress in heart disease and how to cope with stress positively.     Exercising on Your Own:  -Group instruction provided by verbal instruction, power point, and written materials to support  subject.  Instructors discuss benefits of exercise, components of exercise, frequency and intensity of exercise, and end points for exercise.  Also discuss use of nitroglycerin and activating EMS.  Review options of places to exercise outside of rehab.  Review guidelines for sex with heart disease.   Cardiac Drugs I:  -Group instruction provided by verbal instruction and written materials to support subject.  Instructor reviews cardiac drug classes: antiplatelets, anticoagulants, beta blockers, and statins.  Instructor discusses reasons, side effects, and lifestyle considerations for each drug class.   Cardiac Drugs II:  -Group instruction provided by verbal instruction and written materials to support subject.  Instructor reviews cardiac drug classes: angiotensin converting enzyme inhibitors (ACE-I), angiotensin II receptor blockers (ARBs), nitrates, and calcium channel blockers.  Instructor discusses reasons, side effects, and lifestyle considerations for each drug class.   Anatomy and Physiology of the Circulatory System:  Group verbal and written instruction and models provide basic cardiac anatomy and physiology, with the coronary electrical and arterial systems. Review of: AMI, Angina, Valve disease, Heart Failure, Peripheral Artery Disease, Cardiac Arrhythmia, Pacemakers, and the ICD.   CARDIAC REHAB PHASE II EXERCISE from 06/29/2018 in [place]  Date  06/29/18  Educator  RN  Instruction Review Code  2- Demonstrated Understanding      Other Education:  -Group or individual verbal, written, or video instructions that support the educational goals of the cardiac rehab program.   Holiday Eating Survival Tips:  -Group instruction provided by PowerPoint slides, verbal discussion, and written materials to support subject matter. The instructor gives patients tips, tricks, and techniques to help them not only survive but enjoy the holidays despite the  onslaught of food that accompanies the holidays.   Knowledge Questionnaire Score: Knowledge Questionnaire Score - 06/14/18 1438      Knowledge Questionnaire Score   Pre Score  21/24       Core Components/Risk Factors/Patient Goals at Admission: Personal Goals and Risk Factors at Admission - 06/14/18 1542      Core Components/Risk Factors/Patient Goals on Admission    Weight Management  Yes;Obesity;Weight Maintenance;Weight Loss    Intervention  Weight Management: Develop a combined nutrition and exercise program designed to reach desired caloric intake, while maintaining appropriate intake of nutrient and fiber, sodium and fats, and appropriate energy expenditure required for the weight goal.;Weight Management: Provide education and appropriate resources to help participant work on and attain dietary goals.;Weight Management/Obesity: Establish reasonable short term and long term weight goals.;Obesity: Provide education and appropriate resources to help participant work on and  attain dietary goals.    Admit Weight  175 lb 14.8 oz (79.8 kg)    Expected Outcomes  Short Term: Continue to assess and modify interventions until short term weight is achieved;Long Term: Adherence to nutrition and physical activity/exercise program aimed toward attainment of established weight goal;Weight Maintenance: Understanding of the daily nutrition guidelines, which includes 25-35% calories from fat, 7% or less cal from saturated fats, less than 200mg  cholesterol, less than 1.5gm of sodium, & 5 or more servings of fruits and vegetables daily;Weight Loss: Understanding of general recommendations for a balanced deficit meal plan, which promotes 1-2 lb weight loss per week and includes a negative energy balance of [phone] kcal/d;Understanding recommendations for meals to include 15-35% energy as protein, 25-35% energy from fat, 35-60% energy from carbohydrates, less than 200mg  of dietary cholesterol, 20-35 gm of total  fiber daily;Understanding of distribution of calorie intake throughout the day with the consumption of 4-5 meals/snacks    Diabetes  Yes    Intervention  Provide education about signs/symptoms and action to take for hypo/hyperglycemia.;Provide education about proper nutrition, including hydration, and aerobic/resistive exercise prescription along with prescribed medications to achieve blood glucose in normal ranges: Fasting glucose 65-99 mg/dL    Expected Outcomes  Short Term: Participant verbalizes understanding of the signs/symptoms and immediate care of hyper/hypoglycemia, proper foot care and importance of medication, aerobic/resistive exercise and nutrition plan for blood glucose control.;Long Term: Attainment of HbA1C < 7%.    Hypertension  Yes    Intervention  Provide education on lifestyle modifcations including regular physical activity/exercise, weight management, moderate sodium restriction and increased consumption of fresh fruit, vegetables, and low fat dairy, alcohol moderation, and smoking cessation.;Monitor prescription use compliance.    Expected Outcomes  Short Term: Continued assessment and intervention until BP is < 140/67mm HG in hypertensive participants. < 130/63mm HG in hypertensive participants with diabetes, heart failure or chronic kidney disease.;Long Term: Maintenance of blood pressure at goal levels.    Lipids  Yes    Intervention  Provide education and support for participant on nutrition & aerobic/resistive exercise along with prescribed medications to achieve LDL 70mg , HDL >40mg .    Expected Outcomes  Short Term: Participant states understanding of desired cholesterol values and is compliant with medications prescribed. Participant is following exercise prescription and nutrition guidelines.;Long Term: Cholesterol controlled with medications as prescribed, with individualized exercise RX and with personalized nutrition plan. Value goals: LDL < 70mg , HDL > 40 mg.    Stress   Yes    Intervention  Offer individual and/or small group education and counseling on adjustment to heart disease, stress management and health-related lifestyle change. Teach and support self-help strategies.;Refer participants experiencing significant psychosocial distress to appropriate mental health specialists for further evaluation and treatment. When possible, include family members and significant others in education/counseling sessions.    Expected Outcomes  Short Term: Participant demonstrates changes in health-related behavior, relaxation and other stress management skills, ability to obtain effective social support, and compliance with psychotropic medications if prescribed.;Long Term: Emotional wellbeing is indicated by absence of clinically significant psychosocial distress or social isolation.       Core Components/Risk Factors/Patient Goals Review:  Goals and Risk Factor Review    Row Name 06/23/18 1342 07/14/18 1559           Core Components/Risk Factors/Patient Goals Review   Personal Goals Review  Weight Management/Obesity;Lipids;Diabetes;Hypertension  Weight Management/Obesity;Lipids;Diabetes;Hypertension;Stress      Review  Marland Mcalpine started exercise at cardiac rehab this week. Barabara's vital  signs and CBG's have been stable so far. Silver does not check her cBG's at home.  Latonga's vital signs have been stable at phase 2 cardiac rehab. Vaishali's CBG's have beens table at cardiac rehab. We continue to check Solomia's CBG's at cardiac rehab as she does not check them at home      Expected Outcomes  Patient will participate in cardiac rehab for exercise, diet and lifestyle modifcations. Will encourage Doloros to lear how to check her own CBG's so she can check her CBg's at home.  Patient will participate in cardiac rehab for exercise, diet and lifestyle modifcations. Will encourage Deshara to lear how to check her own CBG's so she can check her CBg's at home.         Core  Components/Risk Factors/Patient Goals at Discharge (Final Review):  Goals and Risk Factor Review - 07/14/18 1559      Core Components/Risk Factors/Patient Goals Review   Personal Goals Review  Weight Management/Obesity;Lipids;Diabetes;Hypertension;Stress    Review  Petrona's vital signs have been stable at phase 2 cardiac rehab. Jalayla's CBG's have beens table at cardiac rehab. We continue to check Catera's CBG's at cardiac rehab as she does not check them at home    Expected Outcomes  Patient will participate in cardiac rehab for exercise, diet and lifestyle modifcations. Will encourage Lilliona to lear how to check her own CBG's so she can check her CBg's at home.       ITP Comments: ITP Comments    Row Name 06/14/18 1435 06/22/18 1446 07/14/18 1554       ITP Comments  Dr.Traci Turner, Medical Director   30 day ITP Review. Ascencion started exercise at cardiac rehab on Monday 06/20/18  30 day ITP Review. Cinthia has good attendance and participation in phase 2 cardiac rehab.        Comments: See ITP comments.Barnet Pall, RN,BSN 07/14/2018 4:04 PM

## 2018-07-15 ENCOUNTER — Encounter (HOSPITAL_COMMUNITY)
Admission: RE | Admit: 2018-07-15 | Discharge: 2018-07-15 | Disposition: A | Discharge status: Home / Self Care | Payer: Medicare Other | Source: Ambulatory Visit | Attending: Cardiology | Admitting: Cardiology

## 2018-07-15 ENCOUNTER — Ambulatory Visit (HOSPITAL_COMMUNITY): Payer: Medicare Other

## 2018-07-15 DIAGNOSIS — Z955 Presence of coronary angioplasty implant and graft: Secondary | ICD-10-CM

## 2018-07-15 DIAGNOSIS — I214 Non-ST elevation (NSTEMI) myocardial infarction: Secondary | ICD-10-CM | POA: Diagnosis not present

## 2018-07-16 LAB — GLUCOSE, CAPILLARY: GLUCOSE-CAPILLARY: 120 mg/dL — AB (ref 70–99)

## 2018-07-18 ENCOUNTER — Ambulatory Visit (HOSPITAL_COMMUNITY): Payer: Medicare Other

## 2018-07-18 ENCOUNTER — Encounter (HOSPITAL_COMMUNITY)
Admission: RE | Admit: 2018-07-18 | Discharge: 2018-07-18 | Disposition: A | Discharge status: Home / Self Care | Payer: Medicare Other | Source: Ambulatory Visit | Attending: Cardiology | Admitting: Cardiology

## 2018-07-18 DIAGNOSIS — Z955 Presence of coronary angioplasty implant and graft: Secondary | ICD-10-CM

## 2018-07-18 DIAGNOSIS — I214 Non-ST elevation (NSTEMI) myocardial infarction: Secondary | ICD-10-CM | POA: Diagnosis not present

## 2018-07-18 LAB — GLUCOSE, CAPILLARY: Glucose-Capillary: 115 mg/dL — ABNORMAL HIGH (ref 70–99)

## 2018-07-19 ENCOUNTER — Ambulatory Visit: Payer: Medicare Other | Admitting: Nurse Practitioner

## 2018-07-19 ENCOUNTER — Other Ambulatory Visit (HOSPITAL_COMMUNITY): Payer: Self-pay | Admitting: Medical

## 2018-07-19 NOTE — Progress Notes (Deleted)
CARDIOLOGY OFFICE NOTE  Date:  07/19/2018    Kristine Oliver Date of Birth: 1930/10/06 Medical Record #[id number]  PCP:  Lavone Orn, MD  Cardiologist:  Marlou Porch    No chief complaint on file.   History of Present Illness: Kristine Oliver is a 84 y.o. female who presents today for a 3 month check. Seen for Dr. Marlou Porch.   She has a history of CAD with prior catheterization 03/21/2018, DES placed to mid circumflex lesion, dual antiplatelet therapy with Plavix and aspirin with normal ejection fraction. Has had 2 ER visits since her PCI - had constipation/fecal impaction on one visit and chest pain on another.   Last seen in September - overall felt to be stable. She lives in [place] with her nephew who is disabled, age 48.  She enjoys watching the deer out of her backyard.  Comes in today. Here with   Past Medical History:  Diagnosis Date  . Bipolar affective disorder ([place])   . Bradycardia   . CAD (coronary artery disease)    a. 08/2004 NSTEMI/PCI: LAD 18m/d (2.75 x 28 mm Taxus 2 DES);  b. 10/2010 Cath: LM nl, LAD patent stents w/ jailed septal, LCX 30, RCA 30; c. 06/2013 MV: No ischemia. Small fixed anteroseptal defect, ? scar vs atten. EF 62%; d. 03/2016 MV: EF 57%, Low risk.  . Chronic diastolic heart failure ([place])    a. Echo 7/13:  mod LVH, EF 65-70%, vigorous LV, Gr 1 diast dysfn, mild LAE; b. 05/2017 Echo: EF 60-65%, mod LVH, no rwma, Gr1 DD, mildly dil LA.  Marland Kitchen Chronic pain disorder   . CKD (chronic kidney disease), stage III ([place])   . DDD (degenerative disc disease), lumbosacral    , Spinal stenosis  . Diabetes mellitus ([place])    , Type II  . GERD (gastroesophageal reflux disease)   . HTN (hypertension)   . Hyperlipidemia   . Hyperthyroidism   . IBS (irritable bowel syndrome)   . Lumbar disc disease   . Nonproliferative diabetic retinopathy associated with type 2 diabetes mellitus ([place])   . Obesity (BMI 30-39.9)   . Peripheral neuropathy    , Associated with DM    . Scoliosis     Past Surgical History:  Procedure Laterality Date  . BACK SURGERY    . CARDIAC CATHETERIZATION    . CHOLECYSTECTOMY    . CORONARY STENT INTERVENTION N/A 03/21/2018   Procedure: CORONARY STENT INTERVENTION;  Surgeon: Jettie Booze, MD;  Location: [facility];  Service: Cardiovascular;  Laterality: N/A;  . DILATION AND CURETTAGE OF UTERUS    . HEMILAMINOTOMY LUMBAR SPINE  2002   Rt L5, with decompression and foraminotomy  . INDUCED ABORTION     , x2  . LAMINOTOMY Right   . LEFT HEART CATH AND CORONARY ANGIOGRAPHY N/A 03/21/2018   Procedure: LEFT HEART CATH AND CORONARY ANGIOGRAPHY;  Surgeon: Jettie Booze, MD;  Location: [facility];  Service: Cardiovascular;  Laterality: N/A;  . LUMBAR Lead SURGERY  07/1999   L5-S1  . PILONIDAL CYST EXCISION    . ROTATOR CUFF REPAIR    . TONSILLECTOMY    . TOTAL ABDOMINAL HYSTERECTOMY W/ BILATERAL SALPINGOOPHORECTOMY       Medications: No outpatient medications have been marked as taking for the 07/19/18 encounter (Appointment) with Burtis Junes, NP.     Allergies: Allergies  Allergen Reactions  . Pravastatin Other (See Comments)    Muscle aches  .  Prednisone Nausea And Vomiting    Social History: The patient  reports that she has quit smoking. She quit after 35.00 years of use. She has never used smokeless tobacco. She reports that she does not drink alcohol or use drugs.   Family History: The patient's family history includes Coronary artery disease in her mother; Diabetes in her father; Heart failure in her father.   Review of Systems: Please see the history of present illness.   Otherwise, the review of systems is positive for none.   All other systems are reviewed and negative.   Physical Exam: VS:  There were no vitals taken for this visit. Marland Kitchen  BMI There is no height or weight on file to calculate BMI.  Wt Readings from Last 3 Encounters:  06/14/18 175 lb 14.8 oz (79.8 kg)   05/02/18 174 lb 6.4 oz (79.1 kg)  03/29/18 174 lb 12.8 oz (79.3 kg)    General: Pleasant. Well developed, well nourished and in no acute distress.   HEENT: Normal.  Neck: Supple, no JVD, carotid bruits, or masses noted.  Cardiac: Regular rate and rhythm. No murmurs, rubs, or gallops. No edema.  Respiratory:  Lungs are clear to auscultation bilaterally with normal work of breathing.  GI: Soft and nontender.  MS: No deformity or atrophy. Gait and ROM intact.  Skin: Warm and dry. Color is normal.  Neuro:  Strength and sensation are intact and no gross focal deficits noted.  Psych: Alert, appropriate and with normal affect.   LABORATORY DATA:  EKG:  EKG is not ordered today. This demonstrates .  Lab Results  Component Value Date   WBC 9.1 04/22/2018   HGB 11.2 (L) 04/22/2018   HCT 36.3 04/22/2018   PLT 228 04/22/2018   GLUCOSE 161 (H) 04/22/2018   CHOL 142 03/19/2018   TRIG 103 03/19/2018   HDL 30 (L) 03/19/2018   LDLCALC 91 03/19/2018   ALT 32 06/26/2013   AST 30 06/26/2013   NA 138 04/22/2018   K 4.3 04/22/2018   CL 109 04/22/2018   CREATININE 1.26 (H) 04/22/2018   BUN 17 04/22/2018   CO2 21 (L) 04/22/2018   TSH 1.903 05/19/2016   INR 1.01 06/26/2013   HGBA1C 6.7 (H) 02/13/2012     BNP (last 3 results) No results for input(s): BNP in the last 8760 hours.  ProBNP (last 3 results) Recent Labs    04/27/18 1606  PROBNP 2,300*     Other Studies Reviewed Today:  CORONARY STENT INTERVENTION 03/2018  LEFT HEART CATH AND CORONARY ANGIOGRAPHY  Conclusion     Prox RCA to Mid RCA lesion is 25% stenosed.  Mid Cx lesion is 90% stenosed.  A drug-eluting stent was successfully placed using a STENT SIERRA 2.75 X 28 MM.  Post intervention, there is a 0% residual stenosis.  Ost Cx to Prox Cx lesion is 50% stenosed.  Previously placed Mid LAD stent (unknown type) is widely patent.  Prox LAD lesion is 25% stenosed.  LV end diastolic pressure is mildly  elevated. LVEDP 17 mm Hg.  There is no aortic valve stenosis.  Vasospasm and discomfort in the right arm from the radial approach.     Recommend uninterrupted dual antiplatelet therapy with Aspirin 81mg  daily and Clopidogrel 75mg  daily for a minimum of 12 months (ACS - Class I recommendation).   Patient was already on Plavix.  COuld consdier Brilinta instead if bleeding risk is low.   Continue aggressive secondary prevention.  Assessment/Plan:  Coronary artery disease/prior non-ST elevation myocardial infarction - Continue with dual antiplatelet therapy, aspirin and Plavix.  03/22/2019 will be 1 year. -Imdur to help with angina. -No beta-blocker because of excessive fatigue - Statin therapy, Crestor. -Continue with walking.  Sinus bradycardia - Has seen Dr. Rayann Heman in the past for consultation for pacemaker but she has not had any syncopal symptoms, mild first-degree AV block, avoiding AV nodal blocking agents normally for fatigue but because of this.  Did not wish to go forward with pacemaker.  Diabetes with hypertension - Metformin.  Doing well. -Norvasc  Current medicines are reviewed with the patient today.  The patient does not have concerns regarding medicines other than what has been noted above.  The following changes have been made:  See above.  Labs/ tests ordered today include:   No orders of the defined types were placed in this encounter.    Disposition:   FU with *** in {gen number 9-72:[id number]} {Days to years:10300}.   Patient is agreeable to this plan and will call if any problems develop in the interim.   SignedTruitt Merle, NP  07/19/2018 1:08 PM  [facility] [address] [place], [place]  [address] Phone: [phone] Fax: [phone]

## 2018-07-20 ENCOUNTER — Encounter (HOSPITAL_COMMUNITY)
Admission: RE | Admit: 2018-07-20 | Discharge: 2018-07-20 | Disposition: A | Discharge status: Home / Self Care | Payer: Medicare Other | Source: Ambulatory Visit | Attending: Cardiology | Admitting: Cardiology

## 2018-07-20 ENCOUNTER — Encounter: Payer: Self-pay | Admitting: Nurse Practitioner

## 2018-07-20 ENCOUNTER — Ambulatory Visit (HOSPITAL_COMMUNITY): Payer: Medicare Other

## 2018-07-20 DIAGNOSIS — I214 Non-ST elevation (NSTEMI) myocardial infarction: Secondary | ICD-10-CM | POA: Diagnosis not present

## 2018-07-20 DIAGNOSIS — Z955 Presence of coronary angioplasty implant and graft: Secondary | ICD-10-CM

## 2018-07-20 LAB — GLUCOSE, CAPILLARY: Glucose-Capillary: 105 mg/dL — ABNORMAL HIGH (ref 70–99)

## 2018-07-22 ENCOUNTER — Telehealth: Payer: Self-pay | Admitting: Cardiology

## 2018-07-22 ENCOUNTER — Other Ambulatory Visit: Payer: Self-pay | Admitting: Cardiology

## 2018-07-22 ENCOUNTER — Ambulatory Visit (HOSPITAL_COMMUNITY): Payer: Medicare Other

## 2018-07-22 ENCOUNTER — Other Ambulatory Visit: Payer: Self-pay | Admitting: Physician Assistant

## 2018-07-22 ENCOUNTER — Encounter (HOSPITAL_COMMUNITY)
Admission: RE | Admit: 2018-07-22 | Discharge: 2018-07-22 | Disposition: A | Discharge status: Home / Self Care | Payer: Medicare Other | Source: Ambulatory Visit | Attending: Cardiology | Admitting: Cardiology

## 2018-07-22 ENCOUNTER — Ambulatory Visit (HOSPITAL_COMMUNITY)
Admission: RE | Admit: 2018-07-22 | Discharge: 2018-07-22 | Disposition: A | Discharge status: Home / Self Care | Payer: Medicare Other | Source: Ambulatory Visit | Attending: Family Medicine | Admitting: Family Medicine

## 2018-07-22 DIAGNOSIS — I214 Non-ST elevation (NSTEMI) myocardial infarction: Secondary | ICD-10-CM

## 2018-07-22 DIAGNOSIS — Z955 Presence of coronary angioplasty implant and graft: Secondary | ICD-10-CM | POA: Diagnosis not present

## 2018-07-22 LAB — GLUCOSE, CAPILLARY: GLUCOSE-CAPILLARY: 125 mg/dL — AB (ref 70–99)

## 2018-07-22 MED ORDER — APIXABAN 5 MG PO TABS: 5.0000 mg | ORAL_TABLET | Freq: Two times a day (BID) | ORAL | 0 refills | Status: DC

## 2018-07-22 MED ORDER — APIXABAN 5 MG PO TABS: 5.0000 mg | ORAL_TABLET | Freq: Two times a day (BID) | ORAL | Status: DC

## 2018-07-22 NOTE — Progress Notes (Signed)
Telemetry rhythm irregular. P wave hard to visualize. 12 lead ECG obtained. Pecolia Ades NP called and notified. Gae Bon came down to cardiac rehab and reviewed the 12 lead ECG. Blood pressure 118/70. Heart rate 91. Oxygen saturation 99% on room air. Patient asymptomatic. 12 lead ECG confirmed Atrial fibrillation. Gae Bon told the patient to stop her aspirin  And to start taking eliquis twice a day. Gae Bon gave Caroleen a coupon card for [employer]. Patient left cardiac rehab without complaints. Gae Bon said that Aesha may return to exercise at cardiac rehab on Monday as long as her rate is below 100 and the patient remains asymptomatic. Gae Bon told the patient to report to the ED if she develops palpitations or starts to feel bad. Patient states understanding. Patient is going to [facility] to pick up new prescription this evening.Will fax exercise flow sheets to Dr. Marlou Porch office for review with today's ECG tracings.Barnet Pall, RN,BSN 07/22/2018 5:30 PM

## 2018-07-22 NOTE — Telephone Encounter (Signed)
Pt in Afib

## 2018-07-22 NOTE — Telephone Encounter (Signed)
I was called by cardiac rehab for pt found to be in afib. EKG confirms. I went to assess pt. Rates in the 80's and pt essentially asymptomatic, feels a little anxious by no chest discomfort, shortness of breath or dizziness. I will start anticoagulation with Eliquis and stop aspirin. Discussed with patient and 30 day free card given.  We discussed alarm symptoms and when to report to the ED.   I will route this to Dr. Marlou Porch and to the office triage so that she office visit can be arranged.  Daune Perch, AGNP-C [facility] 07/22/2018  5:21 PM

## 2018-07-25 ENCOUNTER — Ambulatory Visit (HOSPITAL_COMMUNITY): Payer: Medicare Other

## 2018-07-25 ENCOUNTER — Encounter (HOSPITAL_COMMUNITY): Payer: Medicare Other

## 2018-07-26 ENCOUNTER — Telehealth (HOSPITAL_COMMUNITY): Payer: Self-pay | Admitting: *Deleted

## 2018-07-28 ENCOUNTER — Other Ambulatory Visit: Payer: Self-pay | Admitting: Cardiology

## 2018-07-28 NOTE — Telephone Encounter (Signed)
Scheduled pt for f/u of new onset At Fib 1/2 at 8:40 am.  Left message on pt's vm to c/b to reschedule if this is now a good day/time for her.

## 2018-07-29 ENCOUNTER — Encounter (HOSPITAL_COMMUNITY)
Admission: RE | Admit: 2018-07-29 | Discharge: 2018-07-29 | Disposition: A | Discharge status: Home / Self Care | Payer: Medicare Other | Source: Ambulatory Visit | Attending: Cardiology | Admitting: Cardiology

## 2018-07-29 ENCOUNTER — Ambulatory Visit (HOSPITAL_COMMUNITY): Payer: Medicare Other

## 2018-07-29 DIAGNOSIS — I214 Non-ST elevation (NSTEMI) myocardial infarction: Secondary | ICD-10-CM

## 2018-07-29 DIAGNOSIS — Z955 Presence of coronary angioplasty implant and graft: Secondary | ICD-10-CM

## 2018-07-29 LAB — GLUCOSE, CAPILLARY: GLUCOSE-CAPILLARY: 104 mg/dL — AB (ref 70–99)

## 2018-07-29 NOTE — Progress Notes (Addendum)
Kristine Oliver returned to cardiac rehab today. Patient remains in atrial fibrillation at a controlled rate. Vital signs stable. Kristine Oliver was notified about her upcoming appointment with Dr Marlou Porch next week. Patient verified that she will make the appointment. Patient did well with exercise.Will continue to monitor the patient throughout  the program.Telsa Dillavou [name], RN,BSN 07/29/2018 4:36 PM

## 2018-08-01 ENCOUNTER — Ambulatory Visit (HOSPITAL_COMMUNITY): Payer: Medicare Other

## 2018-08-01 ENCOUNTER — Encounter (HOSPITAL_COMMUNITY): Payer: Medicare Other

## 2018-08-01 ENCOUNTER — Other Ambulatory Visit (HOSPITAL_COMMUNITY): Payer: Self-pay | Admitting: Medical

## 2018-08-01 ENCOUNTER — Telehealth: Payer: Self-pay | Admitting: Cardiology

## 2018-08-01 NOTE — Telephone Encounter (Signed)
°*  STAT* If patient is at the pharmacy, call can be transferred to refill team.   1. Which medications need to be refilled? (please list name of each medication and dose if known)  Isosorbide  2. Which pharmacy/location (including street and city if local pharmacy) is medication to be sent to?[facility] [phone]  3. Do they need a 30 day or 90 day supply? 45 and refills

## 2018-08-01 NOTE — Telephone Encounter (Signed)
*  STAT* If patient is at the pharmacy, call can be transferred to refill team.   1. Which medications need to be refilled? (please list name of each medication and dose if known) isosorbide mononitrate (IMDUR) 30 MG 24 hr tablet  2. Which pharmacy/location (including street and city if local pharmacy) is medication to be sent to? [facility]  3. Do they need a 30 day or 90 day supply? 90   Patient is completely out of medication

## 2018-08-01 NOTE — Telephone Encounter (Signed)
According to all documentation patient is to be taking Isosorbide 30 mg daily.  If he dose changed it was ordered by an MD outside of [facility] system and pharmacy should f/u with them.  According to our records she should still be taking 30 mg daily.

## 2018-08-02 NOTE — Telephone Encounter (Signed)
I am fine with her taking half of the isosorbide 30 mg daily. [name], MD

## 2018-08-04 ENCOUNTER — Other Ambulatory Visit: Payer: Self-pay | Admitting: Cardiology

## 2018-08-04 ENCOUNTER — Encounter: Payer: Self-pay | Admitting: *Deleted

## 2018-08-04 ENCOUNTER — Encounter: Payer: Self-pay | Admitting: Cardiology

## 2018-08-04 ENCOUNTER — Ambulatory Visit: Payer: Medicare Other | Admitting: Cardiology

## 2018-08-04 VITALS — BP 118/60 | HR 76 | Ht 59.0 in | Wt 175.1 lb

## 2018-08-04 DIAGNOSIS — R001 Bradycardia, unspecified: Secondary | ICD-10-CM | POA: Diagnosis not present

## 2018-08-04 DIAGNOSIS — I2510 Atherosclerotic heart disease of native coronary artery without angina pectoris: Secondary | ICD-10-CM

## 2018-08-04 DIAGNOSIS — I4819 Other persistent atrial fibrillation: Secondary | ICD-10-CM | POA: Diagnosis not present

## 2018-08-04 DIAGNOSIS — Z79899 Other long term (current) drug therapy: Secondary | ICD-10-CM

## 2018-08-04 DIAGNOSIS — I10 Essential (primary) hypertension: Secondary | ICD-10-CM

## 2018-08-04 DIAGNOSIS — I209 Angina pectoris, unspecified: Secondary | ICD-10-CM

## 2018-08-04 DIAGNOSIS — E119 Type 2 diabetes mellitus without complications: Secondary | ICD-10-CM

## 2018-08-04 LAB — BASIC METABOLIC PANEL
BUN/Creatinine Ratio: 16 (ref 12–28)
BUN: 25 mg/dL (ref 8–27)
CALCIUM: 11 mg/dL — AB (ref 8.7–10.3)
CO2: 19 mmol/L — ABNORMAL LOW (ref 20–29)
CREATININE: 1.53 mg/dL — AB (ref 0.57–1.00)
Chloride: 104 mmol/L (ref 96–106)
GFR, EST AFRICAN AMERICAN: 35 mL/min/{1.73_m2} — AB (ref >59)
GFR, EST NON AFRICAN AMERICAN: 31 mL/min/{1.73_m2} — AB (ref >59)
Glucose: 89 mg/dL (ref 65–99)
Potassium: 4.6 mmol/L (ref 3.5–5.2)
Sodium: 139 mmol/L (ref 134–144)

## 2018-08-04 LAB — CBC
HEMATOCRIT: 35.3 % (ref 34.0–46.6)
HEMOGLOBIN: 11.1 g/dL (ref 11.1–15.9)
MCH: 29.4 pg (ref 26.6–33.0)
MCHC: 31.4 g/dL — AB (ref 31.5–35.7)
MCV: 94 fL (ref 79–97)
Platelets: 303 10*3/uL (ref 150–450)
RBC: 3.77 x10E6/uL (ref 3.77–5.28)
RDW: 15.7 % — ABNORMAL HIGH (ref 12.3–15.4)
WBC: 9.4 10*3/uL (ref 3.4–10.8)

## 2018-08-04 MED ORDER — APIXABAN 5 MG PO TABS: 5.0000 mg | ORAL_TABLET | Freq: Two times a day (BID) | ORAL | 11 refills | Status: DC

## 2018-08-04 NOTE — H&P (View-Only) (Signed)
Cardiology Office Note:    Date:  08/04/2018   ID:  Kristine Oliver, Kristine Oliver [birth year], MRN [id number]  PCP:  Kristine Orn, MD  Cardiologist:  Kristine Furbish, MD  Electrophysiologist:  None   Referring MD: Kristine Orn, MD     History of Present Illness:    Kristine Oliver is a 85 y.o. female here for follow-up of newly discovered atrial fibrillation while in cardiac rehab 07/22/2018, CAD, catheterization 03/21/2018, DES mid circumflex, Plavix aspirin normal EF.  After stent, went to the emergency department with constipation impaction.  Following this 1 month later went back to the emergency department chest pain, troponins negative.  Trying to walk [employer] when she can otherwise.  Lives in a condominium with her nephew who is disabled at age 44.  Reminded me that she enjoys watching the deer out of her backyard.  She feels a little bit anxious by this diagnosis but no chest discomfort no shortness of breath.  She feels some fatigue.  She is convinced that is from her atrial fibrillation.  Note, she wanted me to take bipolar disorder off of her past medical history.  She states that she has anxiety only.  Past Medical History:  Diagnosis Date  . Anxiety   . Bradycardia   . CAD (coronary artery disease)    a. 08/2004 NSTEMI/PCI: LAD 54m/d (2.75 x 28 mm Taxus 2 DES);  b. 10/2010 Cath: LM nl, LAD patent stents w/ jailed septal, LCX 30, RCA 30; c. 06/2013 MV: No ischemia. Small fixed anteroseptal defect, ? scar vs atten. EF 62%; d. 03/2016 MV: EF 57%, Low risk.  . Chronic diastolic heart failure ([place])    a. Echo 7/13:  mod LVH, EF 65-70%, vigorous LV, Gr 1 diast dysfn, mild LAE; b. 05/2017 Echo: EF 60-65%, mod LVH, no rwma, Gr1 DD, mildly dil LA.  Marland Kitchen Chronic pain disorder   . CKD (chronic kidney disease), stage III ([place])   . DDD (degenerative disc disease), lumbosacral    , Spinal stenosis  . Diabetes mellitus ([place])    , Type II  . GERD (gastroesophageal reflux disease)   . HTN  (hypertension)   . Hyperlipidemia   . Hyperthyroidism   . IBS (irritable bowel syndrome)   . Lumbar disc disease   . Nonproliferative diabetic retinopathy associated with type 2 diabetes mellitus ([place])   . Obesity (BMI 30-39.9)   . Peripheral neuropathy    , Associated with DM  . Scoliosis     Past Surgical History:  Procedure Laterality Date  . BACK SURGERY    . CARDIAC CATHETERIZATION    . CHOLECYSTECTOMY    . CORONARY STENT INTERVENTION N/A 03/21/2018   Procedure: CORONARY STENT INTERVENTION;  Surgeon: Kristine Booze, MD;  Location: [facility];  Service: Cardiovascular;  Laterality: N/A;  . DILATION AND CURETTAGE OF UTERUS    . HEMILAMINOTOMY LUMBAR SPINE  2002   Rt L5, with decompression and foraminotomy  . INDUCED ABORTION     , x2  . LAMINOTOMY Right   . LEFT HEART CATH AND CORONARY ANGIOGRAPHY N/A 03/21/2018   Procedure: LEFT HEART CATH AND CORONARY ANGIOGRAPHY;  Surgeon: Kristine Booze, MD;  Location: [facility];  Service: Cardiovascular;  Laterality: N/A;  . LUMBAR [place] SURGERY  07/1999   L5-S1  . PILONIDAL CYST EXCISION    . ROTATOR CUFF REPAIR    . TONSILLECTOMY    . TOTAL ABDOMINAL HYSTERECTOMY W/ BILATERAL SALPINGOOPHORECTOMY  Current Medications: Current Meds  Medication Sig  . ALPRAZolam (XANAX) 0.25 MG tablet Take 0.25 mg by mouth 3 (three) times daily as needed. anxiety  . amLODipine (NORVASC) 5 MG tablet Take 1 tablet (5 mg total) by mouth daily.  Marland Kitchen antiseptic oral rinse (BIOTENE) LIQD 15 mLs by Mouth Rinse route as needed for dry mouth.  Marland Kitchen apixaban (ELIQUIS) 5 MG TABS tablet Take 1 tablet (5 mg total) by mouth 2 (two) times daily.  . clindamycin (CLEOCIN T) 1 % lotion Apply 1 application topically daily.  . clopidogrel (PLAVIX) 75 MG tablet Take 1 tablet (75 mg total) by mouth daily.  . cycloSPORINE (RESTASIS) 0.05 % ophthalmic emulsion Place 1 drop into both eyes as needed.   Marland Kitchen estrogen-methylTESTOSTERone 0.625-1.25 MG per  tablet Take 1 tablet by mouth daily.  . furosemide (LASIX) 20 MG tablet Take 1 tablet (20 mg total) by mouth daily.  Marland Kitchen gabapentin (NEURONTIN) 300 MG capsule Take 300 mg by mouth 2 (two) times daily.   . isosorbide mononitrate (IMDUR) 30 MG 24 hr tablet TAKE 1 TABLET(30 MG) BY MOUTH DAILY  . levothyroxine (SYNTHROID, LEVOTHROID) 75 MCG tablet Take 75 mcg by mouth daily.  . metFORMIN (GLUCOPHAGE) 500 MG tablet Take 1 tablet by mouth 2 (two) times daily.  . nitroGLYCERIN (NITROSTAT) 0.4 MG SL tablet Place 1 tablet (0.4 mg total) under the tongue every 5 (five) minutes x 3 doses as needed for chest pain.  Marland Kitchen oxyCODONE (OXY IR/ROXICODONE) 5 MG immediate release tablet Take 5 mg by mouth at bedtime.  . Oxycodone HCl 10 MG TABS Take 1 tablet by mouth 4 (four) times daily.  . pantoprazole (PROTONIX) 40 MG tablet Take 1 tablet (40 mg total) by mouth daily.  Marland Kitchen PREMARIN vaginal cream Place 1 Applicatorful vaginally once a week.   . ramipril (ALTACE) 5 MG capsule TAKE 1 CAPSULE(5 MG) BY MOUTH DAILY  . rosuvastatin (CRESTOR) 20 MG tablet Take 1 tablet (20 mg total) by mouth daily at 6 PM.  . valACYclovir (VALTREX) 500 MG tablet Take 500 mg by mouth 2 (two) times daily.   . vitamin B-12 (CYANOCOBALAMIN) 1000 MCG tablet Take 1,000 mcg by mouth daily.  Marland Kitchen zolpidem (AMBIEN) 10 MG tablet Take 10 mg by mouth at bedtime as needed.   . [DISCONTINUED] apixaban (ELIQUIS) 5 MG TABS tablet Take 1 tablet (5 mg total) by mouth 2 (two) times daily.     Allergies:   Pravastatin and Prednisone   Social History   Socioeconomic History  . Marital status: Divorced    Spouse name: Not on file  . Number of children: Not on file  . Years of education: Not on file  . Highest education level: Not on file  Occupational History  . Occupation: retired  Scientific laboratory technician  . Financial resource strain: Not on file  . Food insecurity:    Worry: Not on file    Inability: Not on file  . Transportation needs:    Medical: Not on file      Non-medical: Not on file  Tobacco Use  . Smoking status: Former Smoker    Years: 35.00  . Smokeless tobacco: Never Used  Substance and Sexual Activity  . Alcohol use: No    Comment: social drinking, not often  . Drug use: No  . Sexual activity: Not on file  Lifestyle  . Physical activity:    Days per week: Not on file    Minutes per session: Not on file  .  Stress: Not on file  Relationships  . Social connections:    Talks on phone: Not on file    Gets together: Not on file    Attends religious service: Not on file    Active member of club or organization: Not on file    Attends meetings of clubs or organizations: Not on file    Relationship status: Not on file  Other Topics Concern  . Not on file  Social History Narrative   Divorced.  Lives with her nephew in [place].  She walks most days of the week.     Family History: The patient's family history includes Coronary artery disease in her mother; Diabetes in her father; Heart failure in her father.  ROS:   Please see the history of present illness.    Denies any fevers chills nausea vomiting syncope all other systems reviewed and are negative.  EKGs/Labs/Other Studies Reviewed:    The following studies were reviewed today: Cardiac rehab notes, office notes, lab work EKG  Cardiac catheterization 03/21/2018: Diagnostic  Dominance: Right    Intervention      EKG: EKG from 07/22/2018 shows atrial fibrillation heart rate 87 bpm personally reviewed and interpreted.  EKG is ordered today.  The ekg ordered today demonstrates 08/04/2018-A. fib 76 nonspecific ST-T wave changes.  Recent Labs: 04/22/2018: BUN 17; Creatinine, Ser 1.26; Hemoglobin 11.2; Platelets 228; Potassium 4.3; Sodium 138 04/27/2018: NT-Pro BNP 2,300  Recent Lipid Panel    Component Value Date/Time   CHOL 142 03/19/2018 0423   TRIG 103 03/19/2018 0423   HDL 30 (L) 03/19/2018 0423   CHOLHDL 4.7 03/19/2018 0423   VLDL 21 03/19/2018 0423   LDLCALC  91 03/19/2018 0423    Physical Exam:    VS:  BP 118/60   Pulse 76   Ht 4\' 11"  (1.499 m)   Wt 175 lb 1.9 oz (79.4 kg)   BMI 35.37 kg/m     Wt Readings from Last 3 Encounters:  08/04/18 175 lb 1.9 oz (79.4 kg)  06/14/18 175 lb 14.8 oz (79.8 kg)  05/02/18 174 lb 6.4 oz (79.1 kg)     GEN:  Well nourished, well developed in no acute distress HEENT: Normal NECK: No JVD; No carotid bruits LYMPHATICS: No lymphadenopathy CARDIAC: Irregularly irregular, no murmurs, rubs, gallops RESPIRATORY:  Clear to auscultation without rales, wheezing or rhonchi  ABDOMEN: Soft, non-tender, non-distended MUSCULOSKELETAL:  No edema; No deformity  SKIN: Warm and dry NEUROLOGIC:  Alert and oriented x 3 PSYCHIATRIC:  Normal affect   ASSESSMENT:    1. Persistent atrial fibrillation   2. Coronary artery disease involving native coronary artery of native heart without angina pectoris   3. Sinus bradycardia   4. Angina pectoris ([place])   5. Essential hypertension   6. Diabetes mellitus with coincident hypertension ([place])   7. Long-term use of high-risk medication    PLAN:    In order of problems listed above:  Persistent atrial fibrillation - On 07/29/2018 she returned to cardiac rehab but was noted to remain in atrial fibrillation.  Vitals stable.  Heart rate was in the 80s.  Anticoagulation was started with Eliquis on 07/22/2018 but now taking BID from 08/03/18, aspirin was stopped.  She remains on Plavix.  thank you to Charlotta Newton, NP.  -We will go ahead and set her up for cardioversion, risks and benefits explained.  Unfortunately, she is now taking her Eliquis correctly from 08/03/2018.  3 weeks from now will be the 23rd when  I reader B.  She feels fatigued, tired.  CAD - Dual antiplatelet therapy until 03/22/2019.  1 year.  However, I will confer with Dr. Irish Lack, I wonder if may be after 6 months she can come off of the Plavix and continue with Eliquis monotherapy perhaps.  She does have  previously placed Taxus stent from several years ago however.  This will help reduce bleeding risk.  Isosorbide can be utilized to help with angina.  No beta-blocker because of excessive fatigue.  Continue high intensity statin therapy, Crestor.  Walking, exercise.  Sinus bradycardia -Has seen EP, Dr. Rayann Heman for pacemaker consultation.  Mild first-degree AV block.  Did not wish to go forward with pacemaker.  Avoiding AV nodal blocking agents.   Diabetes with hypertension -Metformin, doing well, continuing with Norvasc.  Complex medical decision thinking with her new onset atrial fibrillation, education, review of extensive records.  Checking labs, basic metabolic profile and CBC.  Medication Adjustments/Labs and Tests Ordered: Current medicines are reviewed at length with the patient today.  Concerns regarding medicines are outlined above.  Orders Placed This Encounter  Procedures  . CBC  . Basic metabolic panel  . EKG 12-Lead   Meds ordered this encounter  Medications  . apixaban (ELIQUIS) 5 MG TABS tablet    Sig: Take 1 tablet (5 mg total) by mouth 2 (two) times daily.    Dispense:  60 tablet    Refill:  11    Patient Instructions  Medication Instructions:  Please continue medications as listed.  If you need a refill on your cardiac medications before your next appointment, please call your pharmacy.   Lab work: Please have BMP and CBC today. If you have labs (blood work) drawn today and your tests are completely normal, you will receive your results only by: Marland Kitchen MyChart Message (if you have MyChart) OR . A paper copy in the mail If you have any lab test that is abnormal or we need to change your treatment, we will call you to review the results.  Testing/Procedures: Your physician has requested that you have a Cardioversion.  Electrical Cardioversion uses a jolt of electricity to your heart either through paddles or wired patches attached to your chest. This is a controlled,  usually prescheduled, procedure. This procedure is done at the hospital and you are not awake during the procedure. You usually go home the day of the procedure. Please see the instruction sheet given to you today for more information.  Follow-Up: At [facility], you and your health needs are our priority.  As part of our continuing mission to provide you with exceptional heart care, we have created designated Provider Care Teams.  These Care Teams include your primary Cardiologist (physician) and Advanced Practice Providers (APPs -  Physician Assistants and Nurse Practitioners) who all work together to provide you with the care you need, when you need it. You will need a follow up appointment in 6 weeks.  Please call our office 2 months in advance to schedule this appointment.  You may see Kristine Furbish, MD or one of the following Advanced Practice Providers on your designated Care Team:   Truitt Merle, NP Cecilie Kicks, NP . Kathyrn Drown, NP  Thank you for choosing [facility]!!        Signed, Kristine Furbish, MD  08/04/2018 10:27 AM    [place]

## 2018-08-04 NOTE — Patient Instructions (Signed)
Medication Instructions:  Please continue medications as listed.  If you need a refill on your cardiac medications before your next appointment, please call your pharmacy.   Lab work: Please have BMP and CBC today. If you have labs (blood work) drawn today and your tests are completely normal, you will receive your results only by: Marland Kitchen MyChart Message (if you have MyChart) OR . A paper copy in the mail If you have any lab test that is abnormal or we need to change your treatment, we will call you to review the results.  Testing/Procedures: Your physician has requested that you have a Cardioversion.  Electrical Cardioversion uses a jolt of electricity to your heart either through paddles or wired patches attached to your chest. This is a controlled, usually prescheduled, procedure. This procedure is done at the hospital and you are not awake during the procedure. You usually go home the day of the procedure. Please see the instruction sheet given to you today for more information.  Follow-Up: At [facility], you and your health needs are our priority.  As part of our continuing mission to provide you with exceptional heart care, we have created designated Provider Care Teams.  These Care Teams include your primary Cardiologist (physician) and Advanced Practice Providers (APPs -  Physician Assistants and Nurse Practitioners) who all work together to provide you with the care you need, when you need it. You will need a follow up appointment in 6 weeks.  Please call our office 2 months in advance to schedule this appointment.  You may see [name], MD or one of the following Advanced Practice Providers on your designated Care Team:   Truitt Merle, NP Cecilie Kicks, NP . Kathyrn Drown, NP  Thank you for choosing [facility]!!

## 2018-08-04 NOTE — Progress Notes (Signed)
Cardiology Office Note:    Date:  08/04/2018   ID:  Jakya, Dovidio [birth year], MRN [id number]  PCP:  Lavone Orn, MD  Cardiologist:  [name], MD  Electrophysiologist:  None   Referring MD: Lavone Orn, MD     History of Present Illness:    Kristine Oliver is a 85 y.o. female here for follow-up of newly discovered atrial fibrillation while in cardiac rehab 07/22/2018, CAD, catheterization 03/21/2018, DES mid circumflex, Plavix aspirin normal EF.  After stent, went to the emergency department with constipation impaction.  Following this 1 month later went back to the emergency department chest pain, troponins negative.  Trying to walk [employer] when she can otherwise.  Lives in a condominium with her nephew who is disabled at age 36.  Reminded me that she enjoys watching the deer out of her backyard.  She feels a little bit anxious by this diagnosis but no chest discomfort no shortness of breath.  She feels some fatigue.  She is convinced that is from her atrial fibrillation.  Note, she wanted me to take bipolar disorder off of her past medical history.  She states that she has anxiety only.  Past Medical History:  Diagnosis Date  . Anxiety   . Bradycardia   . CAD (coronary artery disease)    a. 08/2004 NSTEMI/PCI: LAD 47m/d (2.75 x 28 mm Taxus 2 DES);  b. 10/2010 Cath: LM nl, LAD patent stents w/ jailed septal, LCX 30, RCA 30; c. 06/2013 MV: No ischemia. Small fixed anteroseptal defect, ? scar vs atten. EF 62%; d. 03/2016 MV: EF 57%, Low risk.  . Chronic diastolic heart failure ([place])    a. Echo 7/13:  mod LVH, EF 65-70%, vigorous LV, Gr 1 diast dysfn, mild LAE; b. 05/2017 Echo: EF 60-65%, mod LVH, no rwma, Gr1 DD, mildly dil LA.  Marland Kitchen Chronic pain disorder   . CKD (chronic kidney disease), stage III ([place])   . DDD (degenerative disc disease), lumbosacral    , Spinal stenosis  . Diabetes mellitus ([place])    , Type II  . GERD (gastroesophageal reflux disease)   . HTN  (hypertension)   . Hyperlipidemia   . Hyperthyroidism   . IBS (irritable bowel syndrome)   . Lumbar disc disease   . Nonproliferative diabetic retinopathy associated with type 2 diabetes mellitus ([place])   . Obesity (BMI 30-39.9)   . Peripheral neuropathy    , Associated with DM  . Scoliosis     Past Surgical History:  Procedure Laterality Date  . BACK SURGERY    . CARDIAC CATHETERIZATION    . CHOLECYSTECTOMY    . CORONARY STENT INTERVENTION N/A 03/21/2018   Procedure: CORONARY STENT INTERVENTION;  Surgeon: Jettie Booze, MD;  Location: [facility];  Service: Cardiovascular;  Laterality: N/A;  . DILATION AND CURETTAGE OF UTERUS    . HEMILAMINOTOMY LUMBAR SPINE  2002   Rt L5, with decompression and foraminotomy  . INDUCED ABORTION     , x2  . LAMINOTOMY Right   . LEFT HEART CATH AND CORONARY ANGIOGRAPHY N/A 03/21/2018   Procedure: LEFT HEART CATH AND CORONARY ANGIOGRAPHY;  Surgeon: Jettie Booze, MD;  Location: [facility];  Service: Cardiovascular;  Laterality: N/A;  . LUMBAR [place] SURGERY  07/1999   L5-S1  . PILONIDAL CYST EXCISION    . ROTATOR CUFF REPAIR    . TONSILLECTOMY    . TOTAL ABDOMINAL HYSTERECTOMY W/ BILATERAL SALPINGOOPHORECTOMY  Current Medications: Current Meds  Medication Sig  . ALPRAZolam (XANAX) 0.25 MG tablet Take 0.25 mg by mouth 3 (three) times daily as needed. anxiety  . amLODipine (NORVASC) 5 MG tablet Take 1 tablet (5 mg total) by mouth daily.  Marland Kitchen antiseptic oral rinse (BIOTENE) LIQD 15 mLs by Mouth Rinse route as needed for dry mouth.  Marland Kitchen apixaban (ELIQUIS) 5 MG TABS tablet Take 1 tablet (5 mg total) by mouth 2 (two) times daily.  . clindamycin (CLEOCIN T) 1 % lotion Apply 1 application topically daily.  . clopidogrel (PLAVIX) 75 MG tablet Take 1 tablet (75 mg total) by mouth daily.  . cycloSPORINE (RESTASIS) 0.05 % ophthalmic emulsion Place 1 drop into both eyes as needed.   Marland Kitchen estrogen-methylTESTOSTERone 0.625-1.25 MG per  tablet Take 1 tablet by mouth daily.  . furosemide (LASIX) 20 MG tablet Take 1 tablet (20 mg total) by mouth daily.  Marland Kitchen gabapentin (NEURONTIN) 300 MG capsule Take 300 mg by mouth 2 (two) times daily.   . isosorbide mononitrate (IMDUR) 30 MG 24 hr tablet TAKE 1 TABLET(30 MG) BY MOUTH DAILY  . levothyroxine (SYNTHROID, LEVOTHROID) 75 MCG tablet Take 75 mcg by mouth daily.  . metFORMIN (GLUCOPHAGE) 500 MG tablet Take 1 tablet by mouth 2 (two) times daily.  . nitroGLYCERIN (NITROSTAT) 0.4 MG SL tablet Place 1 tablet (0.4 mg total) under the tongue every 5 (five) minutes x 3 doses as needed for chest pain.  Marland Kitchen oxyCODONE (OXY IR/ROXICODONE) 5 MG immediate release tablet Take 5 mg by mouth at bedtime.  . Oxycodone HCl 10 MG TABS Take 1 tablet by mouth 4 (four) times daily.  . pantoprazole (PROTONIX) 40 MG tablet Take 1 tablet (40 mg total) by mouth daily.  Marland Kitchen PREMARIN vaginal cream Place 1 Applicatorful vaginally once a week.   . ramipril (ALTACE) 5 MG capsule TAKE 1 CAPSULE(5 MG) BY MOUTH DAILY  . rosuvastatin (CRESTOR) 20 MG tablet Take 1 tablet (20 mg total) by mouth daily at 6 PM.  . valACYclovir (VALTREX) 500 MG tablet Take 500 mg by mouth 2 (two) times daily.   . vitamin B-12 (CYANOCOBALAMIN) 1000 MCG tablet Take 1,000 mcg by mouth daily.  Marland Kitchen zolpidem (AMBIEN) 10 MG tablet Take 10 mg by mouth at bedtime as needed.   . [DISCONTINUED] apixaban (ELIQUIS) 5 MG TABS tablet Take 1 tablet (5 mg total) by mouth 2 (two) times daily.     Allergies:   Pravastatin and Prednisone   Social History   Socioeconomic History  . Marital status: Divorced    Spouse name: Not on file  . Number of children: Not on file  . Years of education: Not on file  . Highest education level: Not on file  Occupational History  . Occupation: retired  Scientific laboratory technician  . Financial resource strain: Not on file  . Food insecurity:    Worry: Not on file    Inability: Not on file  . Transportation needs:    Medical: Not on file      Non-medical: Not on file  Tobacco Use  . Smoking status: Former Smoker    Years: 35.00  . Smokeless tobacco: Never Used  Substance and Sexual Activity  . Alcohol use: No    Comment: social drinking, not often  . Drug use: No  . Sexual activity: Not on file  Lifestyle  . Physical activity:    Days per week: Not on file    Minutes per session: Not on file  .  Stress: Not on file  Relationships  . Social connections:    Talks on phone: Not on file    Gets together: Not on file    Attends religious service: Not on file    Active member of club or organization: Not on file    Attends meetings of clubs or organizations: Not on file    Relationship status: Not on file  Other Topics Concern  . Not on file  Social History Narrative   Divorced.  Lives with her nephew in [place].  She walks most days of the week.     Family History: The patient's family history includes Coronary artery disease in her mother; Diabetes in her father; Heart failure in her father.  ROS:   Please see the history of present illness.    Denies any fevers chills nausea vomiting syncope all other systems reviewed and are negative.  EKGs/Labs/Other Studies Reviewed:    The following studies were reviewed today: Cardiac rehab notes, office notes, lab work EKG  Cardiac catheterization 03/21/2018: Diagnostic  Dominance: Right    Intervention      EKG: EKG from 07/22/2018 shows atrial fibrillation heart rate 87 bpm personally reviewed and interpreted.  EKG is ordered today.  The ekg ordered today demonstrates 08/04/2018-A. fib 76 nonspecific ST-T wave changes.  Recent Labs: 04/22/2018: BUN 17; Creatinine, Ser 1.26; Hemoglobin 11.2; Platelets 228; Potassium 4.3; Sodium 138 04/27/2018: NT-Pro BNP 2,300  Recent Lipid Panel    Component Value Date/Time   CHOL 142 03/19/2018 0423   TRIG 103 03/19/2018 0423   HDL 30 (L) 03/19/2018 0423   CHOLHDL 4.7 03/19/2018 0423   VLDL 21 03/19/2018 0423   LDLCALC  91 03/19/2018 0423    Physical Exam:    VS:  BP 118/60   Pulse 76   Ht 4\' 11"  (1.499 m)   Wt 175 lb 1.9 oz (79.4 kg)   BMI 35.37 kg/m     Wt Readings from Last 3 Encounters:  08/04/18 175 lb 1.9 oz (79.4 kg)  06/14/18 175 lb 14.8 oz (79.8 kg)  05/02/18 174 lb 6.4 oz (79.1 kg)     GEN:  Well nourished, well developed in no acute distress HEENT: Normal NECK: No JVD; No carotid bruits LYMPHATICS: No lymphadenopathy CARDIAC: Irregularly irregular, no murmurs, rubs, gallops RESPIRATORY:  Clear to auscultation without rales, wheezing or rhonchi  ABDOMEN: Soft, non-tender, non-distended MUSCULOSKELETAL:  No edema; No deformity  SKIN: Warm and dry NEUROLOGIC:  Alert and oriented x 3 PSYCHIATRIC:  Normal affect   ASSESSMENT:    1. Persistent atrial fibrillation   2. Coronary artery disease involving native coronary artery of native heart without angina pectoris   3. Sinus bradycardia   4. Angina pectoris ([place])   5. Essential hypertension   6. Diabetes mellitus with coincident hypertension ([place])   7. Long-term use of high-risk medication    PLAN:    In order of problems listed above:  Persistent atrial fibrillation - On 07/29/2018 she returned to cardiac rehab but was noted to remain in atrial fibrillation.  Vitals stable.  Heart rate was in the 80s.  Anticoagulation was started with Eliquis on 07/22/2018 but now taking BID from 08/03/18, aspirin was stopped.  She remains on Plavix.  thank you to Charlotta Newton, NP.  -We will go ahead and set her up for cardioversion, risks and benefits explained.  Unfortunately, she is now taking her Eliquis correctly from 08/03/2018.  3 weeks from now will be the 23rd when  I reader B.  She feels fatigued, tired.  CAD - Dual antiplatelet therapy until 03/22/2019.  1 year.  However, I will confer with Dr. Irish Lack, I wonder if may be after 6 months she can come off of the Plavix and continue with Eliquis monotherapy perhaps.  She does have  previously placed Taxus stent from several years ago however.  This will help reduce bleeding risk.  Isosorbide can be utilized to help with angina.  No beta-blocker because of excessive fatigue.  Continue high intensity statin therapy, Crestor.  Walking, exercise.  Sinus bradycardia -Has seen EP, Dr. Rayann Heman for pacemaker consultation.  Mild first-degree AV block.  Did not wish to go forward with pacemaker.  Avoiding AV nodal blocking agents.   Diabetes with hypertension -Metformin, doing well, continuing with Norvasc.  Complex medical decision thinking with her new onset atrial fibrillation, education, review of extensive records.  Checking labs, basic metabolic profile and CBC.  Medication Adjustments/Labs and Tests Ordered: Current medicines are reviewed at length with the patient today.  Concerns regarding medicines are outlined above.  Orders Placed This Encounter  Procedures  . CBC  . Basic metabolic panel  . EKG 12-Lead   Meds ordered this encounter  Medications  . apixaban (ELIQUIS) 5 MG TABS tablet    Sig: Take 1 tablet (5 mg total) by mouth 2 (two) times daily.    Dispense:  60 tablet    Refill:  11    Patient Instructions  Medication Instructions:  Please continue medications as listed.  If you need a refill on your cardiac medications before your next appointment, please call your pharmacy.   Lab work: Please have BMP and CBC today. If you have labs (blood work) drawn today and your tests are completely normal, you will receive your results only by: Marland Kitchen MyChart Message (if you have MyChart) OR . A paper copy in the mail If you have any lab test that is abnormal or we need to change your treatment, we will call you to review the results.  Testing/Procedures: Your physician has requested that you have a Cardioversion.  Electrical Cardioversion uses a jolt of electricity to your heart either through paddles or wired patches attached to your chest. This is a controlled,  usually prescheduled, procedure. This procedure is done at the hospital and you are not awake during the procedure. You usually go home the day of the procedure. Please see the instruction sheet given to you today for more information.  Follow-Up: At [facility], you and your health needs are our priority.  As part of our continuing mission to provide you with exceptional heart care, we have created designated Provider Care Teams.  These Care Teams include your primary Cardiologist (physician) and Advanced Practice Providers (APPs -  Physician Assistants and Nurse Practitioners) who all work together to provide you with the care you need, when you need it. You will need a follow up appointment in 6 weeks.  Please call our office 2 months in advance to schedule this appointment.  You may see [name], MD or one of the following Advanced Practice Providers on your designated Care Team:   Truitt Merle, NP Cecilie Kicks, NP . Kathyrn Drown, NP  Thank you for choosing [facility]!!        Signed, [name], MD  08/04/2018 10:27 AM    [place]

## 2018-08-05 ENCOUNTER — Ambulatory Visit (HOSPITAL_COMMUNITY): Payer: Medicare Other

## 2018-08-05 ENCOUNTER — Encounter (HOSPITAL_COMMUNITY)
Admission: RE | Admit: 2018-08-05 | Discharge: 2018-08-05 | Disposition: A | Discharge status: Home / Self Care | Payer: Medicare Other | Source: Ambulatory Visit | Attending: Cardiology | Admitting: Cardiology

## 2018-08-05 DIAGNOSIS — Z955 Presence of coronary angioplasty implant and graft: Secondary | ICD-10-CM | POA: Insufficient documentation

## 2018-08-05 DIAGNOSIS — I214 Non-ST elevation (NSTEMI) myocardial infarction: Secondary | ICD-10-CM | POA: Diagnosis not present

## 2018-08-05 LAB — GLUCOSE, CAPILLARY: Glucose-Capillary: 120 mg/dL — ABNORMAL HIGH (ref 70–99)

## 2018-08-08 ENCOUNTER — Encounter (HOSPITAL_COMMUNITY)
Admission: RE | Admit: 2018-08-08 | Discharge: 2018-08-08 | Disposition: A | Discharge status: Home / Self Care | Payer: Medicare Other | Source: Ambulatory Visit | Attending: Cardiology | Admitting: Cardiology

## 2018-08-08 ENCOUNTER — Ambulatory Visit (HOSPITAL_COMMUNITY): Payer: Medicare Other

## 2018-08-08 DIAGNOSIS — Z955 Presence of coronary angioplasty implant and graft: Secondary | ICD-10-CM

## 2018-08-08 DIAGNOSIS — I214 Non-ST elevation (NSTEMI) myocardial infarction: Secondary | ICD-10-CM | POA: Diagnosis not present

## 2018-08-08 LAB — GLUCOSE, CAPILLARY: Glucose-Capillary: 95 mg/dL (ref 70–99)

## 2018-08-08 NOTE — Progress Notes (Signed)
QUALITY OF LIFE SCORE REVIEW  Pt completed Quality of Life survey as a participant in Cardiac Rehab. Scores 21.0 or below are considered low. Pt score very low in the health and functioning area of her quality of life questionnaire Overall 20.36, Health and Function 15.7, socioeconomic 22.8, physiological and spiritual 24.4, family 24.6. Patient quality of life slightly altered by physical constraints which limits ability to perform as prior to recent cardiac illness. Kristine Oliver denies being depressed and is really enjoying .  Offered emotional support and reassurance.  Will continue to monitor and intervene as necessary.  Will fax quality of life questionnaire to Dr Kingsley Plan office for review.Barnet Pall, RN,BSN 08/08/2018 4:33 PM

## 2018-08-10 ENCOUNTER — Ambulatory Visit (HOSPITAL_COMMUNITY): Payer: Medicare Other

## 2018-08-10 ENCOUNTER — Encounter (HOSPITAL_COMMUNITY)
Admission: RE | Admit: 2018-08-10 | Discharge: 2018-08-10 | Disposition: A | Discharge status: Home / Self Care | Payer: Medicare Other | Source: Ambulatory Visit | Attending: Cardiology | Admitting: Cardiology

## 2018-08-10 DIAGNOSIS — Z955 Presence of coronary angioplasty implant and graft: Secondary | ICD-10-CM

## 2018-08-10 DIAGNOSIS — I214 Non-ST elevation (NSTEMI) myocardial infarction: Secondary | ICD-10-CM | POA: Diagnosis not present

## 2018-08-10 LAB — GLUCOSE, CAPILLARY: Glucose-Capillary: 114 mg/dL — ABNORMAL HIGH (ref 70–99)

## 2018-08-11 NOTE — Progress Notes (Signed)
Cardiac Individual Treatment Plan  Patient Details  Name: OAKLIE DURRETT MRN: [id number] Date of Birth: 1931/03/05 Referring Provider:     CARDIAC REHAB PHASE II ORIENTATION from 06/14/2018 in [place]  Referring Provider  Dr. Marlou Porch      Initial Encounter Date:    CARDIAC REHAB PHASE II ORIENTATION from 06/14/2018 in [place]  Date  06/14/18      Visit Diagnosis: 08/16/2019NSTEMI (non-ST elevated myocardial infarction) [facility])  08/16/19Stented coronary artery  Patient's Home Medications on Admission:  Current Outpatient Medications:  .  ALPRAZolam (XANAX) 0.25 MG tablet, Take 0.25 mg by mouth 3 (three) times daily as needed. anxiety, Disp: , Rfl:  .  amLODipine (NORVASC) 5 MG tablet, Take 1 tablet (5 mg total) by mouth daily., Disp: 30 tablet, Rfl: 3 .  antiseptic oral rinse (BIOTENE) LIQD, 15 mLs by Mouth Rinse route as needed for dry mouth., Disp: , Rfl:  .  apixaban (ELIQUIS) 5 MG TABS tablet, Take 1 tablet (5 mg total) by mouth 2 (two) times daily., Disp: 60 tablet, Rfl: 11 .  clindamycin (CLEOCIN T) 1 % lotion, Apply 1 application topically daily., Disp: , Rfl:  .  clopidogrel (PLAVIX) 75 MG tablet, Take 1 tablet (75 mg total) by mouth daily., Disp: 90 tablet, Rfl: 3 .  cycloSPORINE (RESTASIS) 0.05 % ophthalmic emulsion, Place 1 drop into both eyes as needed. , Disp: , Rfl:  .  estrogen-methylTESTOSTERone 0.625-1.25 MG per tablet, Take 1 tablet by mouth daily., Disp: , Rfl:  .  furosemide (LASIX) 20 MG tablet, Take 1 tablet (20 mg total) by mouth daily., Disp: 90 tablet, Rfl: 3 .  gabapentin (NEURONTIN) 300 MG capsule, Take 300 mg by mouth 2 (two) times daily. , Disp: , Rfl:  .  isosorbide mononitrate (IMDUR) 30 MG 24 hr tablet, TAKE 1 TABLET(30 MG) BY MOUTH DAILY, Disp: 30 tablet, Rfl: 2 .  levothyroxine (SYNTHROID, LEVOTHROID) 75 MCG tablet, Take 75 mcg by mouth daily., Disp: , Rfl:  .  metFORMIN (GLUCOPHAGE)  500 MG tablet, Take 1 tablet by mouth 2 (two) times daily., Disp: , Rfl: 0 .  nitroGLYCERIN (NITROSTAT) 0.4 MG SL tablet, Place 1 tablet (0.4 mg total) under the tongue every 5 (five) minutes x 3 doses as needed for chest pain., Disp: 25 tablet, Rfl: 3 .  oxyCODONE (OXY IR/ROXICODONE) 5 MG immediate release tablet, Take 5 mg by mouth at bedtime., Disp: , Rfl:  .  Oxycodone HCl 10 MG TABS, Take 1 tablet by mouth 4 (four) times daily., Disp: , Rfl: 0 .  pantoprazole (PROTONIX) 40 MG tablet, Take 1 tablet (40 mg total) by mouth daily., Disp: 90 tablet, Rfl: 3 .  PREMARIN vaginal cream, Place 1 Applicatorful vaginally once a week. , Disp: , Rfl:  .  ramipril (ALTACE) 5 MG capsule, TAKE 1 CAPSULE(5 MG) BY MOUTH DAILY, Disp: 90 capsule, Rfl: 2 .  rosuvastatin (CRESTOR) 20 MG tablet, Take 1 tablet (20 mg total) by mouth daily at 6 PM., Disp: 90 tablet, Rfl: 3 .  valACYclovir (VALTREX) 500 MG tablet, Take 500 mg by mouth 2 (two) times daily. , Disp: , Rfl:  .  vitamin B-12 (CYANOCOBALAMIN) 1000 MCG tablet, Take 1,000 mcg by mouth daily., Disp: , Rfl:  .  zolpidem (AMBIEN) 10 MG tablet, Take 10 mg by mouth at bedtime as needed. , Disp: , Rfl:   Past Medical History: Past Medical History:  Diagnosis Date  . Anxiety   .  Bradycardia   . CAD (coronary artery disease)    a. 08/2004 NSTEMI/PCI: LAD 41md (2.75 x 28 mm Taxus 2 DES);  b. 10/2010 Cath: LM nl, LAD patent stents w/ jailed septal, LCX 30, RCA 30; c. 06/2013 MV: No ischemia. Small fixed anteroseptal defect, ? scar vs atten. EF 62%; d. 03/2016 MV: EF 57%, Low risk.  . Chronic diastolic heart failure ([place]    a. Echo 7/13:  mod LVH, EF 65-70%, vigorous LV, Gr 1 diast dysfn, mild LAE; b. 05/2017 Echo: EF 60-65%, mod LVH, no rwma, Gr1 DD, mildly dil LA.  .Marland KitchenChronic pain disorder   . CKD (chronic kidney disease), stage III ([place]   . DDD (degenerative disc disease), lumbosacral    , Spinal stenosis  . Diabetes mellitus ([place]    , Type II  . GERD  (gastroesophageal reflux disease)   . HTN (hypertension)   . Hyperlipidemia   . Hyperthyroidism   . IBS (irritable bowel syndrome)   . Lumbar disc disease   . Nonproliferative diabetic retinopathy associated with type 2 diabetes mellitus ([place]   . Obesity (BMI 30-39.9)   . Peripheral neuropathy    , Associated with DM  . Scoliosis     Tobacco Use: Social History   Tobacco Use  Smoking Status Former Smoker  . Years: 35.00  Smokeless Tobacco Never Used    Labs: Recent RChemical engineer   Labs for ITP Cardiac and Pulmonary Rehab Latest Ref Rng & Units 10/26/2010 02/13/2012 05/02/2012 05/19/2016 03/19/2018   Cholestrol 0 - 200 mg/dL 137 ATP III CLASSIFICATION: <200     mg/dL   Desirable 200-239  mg/dL   Borderline High >=240    mg/dL   High - 155 162 142   LDLCALC 0 - 99 mg/dL 65 Total Cholesterol/HDL:CHD Risk Coronary Heart Disease Risk Table Men   Women 1/2 Average Risk   3.4   3.3 Average Risk       5.0   4.4 2 X Average Risk   9.6   7.1 3 X Average Risk  23.4   11.0 Use the calculated Patient Ratio above and the CHD Risk Table to determine the patient's CHD Risk. ATP III CLASSIFICATION (LDL): <100     mg/dL   Optimal 100-129  mg/dL   Near or Above Optimal 130-159  mg/dL   Borderline 160-189  mg/dL   High >190     mg/dL   Very High - 80 103(H) 91   HDL >40 mg/dL 38(L) - 28(L) 31(L) 30(L)   Trlycerides <150 mg/dL 170(H) - 233(H) 138 103   Hemoglobin A1c <5.7 % 6.8 (NOTE)                                                                       According to the ADA Clinical Practice Recommendations for 2011, when HbA1c is used as a screening test:   >=6.5%   Diagnostic of Diabetes Mellitus           (if abnormal result is confirmed)  5.7-6.4%   Increased risk of developing Diabetes Mellitus  References:Diagnosis and Classification of Diabetes Mellitus,Diabetes CXIPJ,8250,53(ZJQBH1):S62-S69 and Standards of Medical Care in         Diabetes -  2011,Diabetes  DSKA,7681,15 (Suppl 1):S11-S61.(H) 6.7(H) - - -   TCO2 0 - 100 mmol/L 21 - - - -      Capillary Blood Glucose: Lab Results  Component Value Date   GLUCAP 114 (H) 08/10/2018   GLUCAP 95 08/08/2018   GLUCAP 120 (H) 08/05/2018   GLUCAP 104 (H) 07/29/2018   GLUCAP 125 (H) 07/22/2018     Exercise Target Goals: Exercise Program Goal: Individual exercise prescription set using results from initial 6 min walk test and THRR while considering  patient's activity barriers and safety.   Exercise Prescription Goal: Initial exercise prescription builds to 30-45 minutes a day of aerobic activity, 2-3 days per week.  Home exercise guidelines will be given to patient during program as part of exercise prescription that the participant will acknowledge.  Activity Barriers & Risk Stratification: Activity Barriers & Cardiac Risk Stratification - 06/14/18 1538      Activity Barriers & Cardiac Risk Stratification   Activity Barriers  Deconditioning;Muscular Weakness;Back Problems;Neck/Spine Problems;Assistive Device;Balance Concerns    Cardiac Risk Stratification  High       6 Minute Walk: 6 Minute Walk    Row Name 06/14/18 1537         6 Minute Walk   Phase  Initial     Distance  800 feet     Walk Time  6 minutes     # of Rest Breaks  2     MPH  1.51     METS  0.42     RPE  13     VO2 Peak  1.48     Symptoms  Yes (comment)     Comments  Fatigue     Resting HR  64 bpm     Resting BP  114/70     Resting Oxygen Saturation   96 %     Max Ex. HR  81 bpm     Max Ex. BP  118/70     2 Minute Post BP  122/68        Oxygen Initial Assessment:   Oxygen Re-Evaluation:   Oxygen Discharge (Final Oxygen Re-Evaluation):   Initial Exercise Prescription: Initial Exercise Prescription - 06/14/18 1600      Date of Initial Exercise RX and Referring Provider   Date  06/14/18    Referring Provider  Dr. Marlou Porch    Expected Discharge Date  09/23/18      NuStep   Level  2    SPM  75     Minutes  10    METs  1      Arm Ergometer   Level  1    Minutes  10    METs  1.3      Track   Laps  6    Minutes  10    METs  2      Prescription Details   Frequency (times per week)  3    Duration  Progress to 30 minutes of continuous aerobic without signs/symptoms of physical distress      Intensity   THRR 40-80% of Max Heartrate  54-108    Ratings of Perceived Exertion  11-13      Progression   Progression  Continue to progress workloads to maintain intensity without signs/symptoms of physical distress.      Resistance Training   Training Prescription  Yes    Weight  2 lbs.    Reps  10-15       Perform  Capillary Blood Glucose checks as needed.  Exercise Prescription Changes: Exercise Prescription Changes    Row Name 06/20/18 1504 07/11/18 1456 07/18/18 1503 08/05/18 1509       Response to Exercise   Blood Pressure (Admit)  120/70  118/78  104/70  114/60    Blood Pressure (Exercise)  118/84  122/74  118/72  134/72    Blood Pressure (Exit)  110/70  128/70  122/72  120/70    Heart Rate (Admit)  73 bpm  95 bpm  84 bpm  85 bpm    Heart Rate (Exercise)  74 bpm  119 bpm  104 bpm  98 bpm    Heart Rate (Exit)  73 bpm  94 bpm  75 bpm  85 bpm    Rating of Perceived Exertion (Exercise)  '13  13  13  14    ' Symptoms  none  none  none  none    Duration  Progress to 30 minutes of  aerobic without signs/symptoms of physical distress  Progress to 30 minutes of  aerobic without signs/symptoms of physical distress  Progress to 30 minutes of  aerobic without signs/symptoms of physical distress  Progress to 30 minutes of  aerobic without signs/symptoms of physical distress    Intensity  THRR unchanged  THRR unchanged  THRR unchanged  THRR unchanged      Progression   Progression  Continue to progress workloads to maintain intensity without signs/symptoms of physical distress.  Continue to progress workloads to maintain intensity without signs/symptoms of physical distress.  Continue to  progress workloads to maintain intensity without signs/symptoms of physical distress.  Continue to progress workloads to maintain intensity without signs/symptoms of physical distress.    Average METs  1.9  1.9  1.7  -      Resistance Training   Training Prescription  Yes  Yes  Yes  Yes    Weight  2 lbs.  2 lbs.  3lbs  2lbs    Reps  10-15  10-15  10-15  10-15    Time  10 Minutes  10 Minutes  10 Minutes  10 Minutes      Interval Training   Interval Training  No  No  No  No      NuStep   Level  '2  2  2  3    ' SPM  75  75  75  75    Minutes  '10  20  10  15    ' METs  1.7  1.9  1.7  -      Arm Ergometer   Level  1  -  -  -    Minutes  10  -  -  -      Track   Laps  '6  6  6  6    ' Minutes  '10  10  15 ' multiple stops  10    METs  2.05  2.05  1.7  -      Home Exercise Plan   Plans to continue exercise at  -  Home (comment) Walking  Home (comment) Walking  Home (comment) Walking    Frequency  -  Add 1 additional day to program exercise sessions.  Add 1 additional day to program exercise sessions.  Add 1 additional day to program exercise sessions.    Initial Home Exercises Provided  -  07/11/18  07/11/18  07/11/18       Exercise Comments: Exercise Comments  [place] Name 06/20/18 1554 07/11/18 1520 07/18/18 1515 08/08/18 0915     Exercise Comments  Patient able to tolerate low intensity exercise, increase workloads as tolerated.  Reviewed home exercise guidelines, METs, and goals with patient.  Reviewed METs and goals with patient.  MET level reviewed with patient on 08/05/18.       Exercise Goals and Review: Exercise Goals    Row Name 06/14/18 1539             Exercise Goals   Increase Physical Activity  Yes       Intervention  Provide advice, education, support and counseling about physical activity/exercise needs.;Develop an individualized exercise prescription for aerobic and resistive training based on initial evaluation findings, risk stratification, comorbidities and  participant's personal goals.       Expected Outcomes  Short Term: Attend rehab on a regular basis to increase amount of physical activity.       Increase Strength and Stamina  Yes       Intervention  Provide advice, education, support and counseling about physical activity/exercise needs.;Develop an individualized exercise prescription for aerobic and resistive training based on initial evaluation findings, risk stratification, comorbidities and participant's personal goals.       Expected Outcomes  Short Term: Increase workloads from initial exercise prescription for resistance, speed, and METs.       Able to understand and use rate of perceived exertion (RPE) scale  Yes       Intervention  Provide education and explanation on how to use RPE scale       Expected Outcomes  Short Term: Able to use RPE daily in rehab to express subjective intensity level;Long Term:  Able to use RPE to guide intensity level when exercising independently       Knowledge and understanding of Target Heart Rate Range (THRR)  Yes       Intervention  Provide education and explanation of THRR including how the numbers were predicted and where they are located for reference       Expected Outcomes  Short Term: Able to state/look up THRR;Long Term: Able to use THRR to govern intensity when exercising independently;Short Term: Able to use daily as guideline for intensity in rehab       Able to check pulse independently  Yes       Intervention  Provide education and demonstration on how to check pulse in carotid and radial arteries.;Review the importance of being able to check your own pulse for safety during independent exercise       Expected Outcomes  Short Term: Able to explain why pulse checking is important during independent exercise;Long Term: Able to check pulse independently and accurately       Understanding of Exercise Prescription  Yes       Intervention  Provide education, explanation, and written materials on  patient's individual exercise prescription       Expected Outcomes  Short Term: Able to explain program exercise prescription;Long Term: Able to explain home exercise prescription to exercise independently          Exercise Goals Re-Evaluation : Exercise Goals Re-Evaluation    Row Name 06/20/18 1554 07/11/18 1520 07/18/18 1515         Exercise Goal Re-Evaluation   Exercise Goals Review  Able to understand and use rate of perceived exertion (RPE) scale;Increase Physical Activity  Able to understand and use rate of perceived exertion (RPE) scale;Increase Physical Activity;Understanding of Exercise Prescription;Knowledge and understanding of  Target Heart Rate Range (THRR)  Able to understand and use rate of perceived exertion (RPE) scale;Increase Physical Activity;Understanding of Exercise Prescription;Knowledge and understanding of Target Heart Rate Range (THRR)     Comments  Patient able to understand and use RPE scale appropriately.  Reviewed home exercise guidelines with patient including THRR, RPE scale, and endpoints for exercise. Pt states that she does some walking.  Patient states she is not walking much at home because of the weather. Encouraged patient to increase workloads at CR since she is unable to walk consistently at home, and pt is agreeable to this suggestion.     Expected Outcomes  Increase workloads as tolerated to help improve cardiorespiratory fitness.  Increase walking duration at home to help increase strength and stamina.  Increase workloads as tolerated to help improve cardiorespiratory fitness.        Discharge Exercise Prescription (Final Exercise Prescription Changes): Exercise Prescription Changes - 08/05/18 1509      Response to Exercise   Blood Pressure (Admit)  114/60    Blood Pressure (Exercise)  134/72    Blood Pressure (Exit)  120/70    Heart Rate (Admit)  85 bpm    Heart Rate (Exercise)  98 bpm    Heart Rate (Exit)  85 bpm    Rating of Perceived Exertion  (Exercise)  14    Symptoms  none    Duration  Progress to 30 minutes of  aerobic without signs/symptoms of physical distress    Intensity  THRR unchanged      Progression   Progression  Continue to progress workloads to maintain intensity without signs/symptoms of physical distress.      Resistance Training   Training Prescription  Yes    Weight  2lbs    Reps  10-15    Time  10 Minutes      Interval Training   Interval Training  No      NuStep   Level  3    SPM  75    Minutes  15      Track   Laps  6    Minutes  10      Home Exercise Plan   Plans to continue exercise at  Home (comment)   Walking   Frequency  Add 1 additional day to program exercise sessions.    Initial Home Exercises Provided  07/11/18       Nutrition:  Target Goals: Understanding of nutrition guidelines, daily intake of sodium <1586m, cholesterol <2056m calories 30% from fat and 7% or less from saturated fats, daily to have 5 or more servings of fruits and vegetables.  Biometrics: Pre Biometrics - 06/14/18 1540      Pre Biometrics   Height  '4\' 11"'  (1.499 m)    Weight  79.8 kg    BMI (Calculated)  35.51        Nutrition Therapy Plan and Nutrition Goals: Nutrition Therapy & Goals - 06/14/18 1629      Nutrition Therapy   Diet  general healthful      Personal Nutrition Goals   Nutrition Goal  Pt to continue to identify and limit food sources of saturated fat, trans fat, refined carbohydrates and sodium    Personal Goal #2  Pt to watch out for sweets and added sugar.      Intervention Plan   Intervention  Prescribe, educate and counsel regarding individualized specific dietary modifications aiming towards targeted core components such as weight, hypertension, lipid management,  diabetes, heart failure and other comorbidities.    Expected Outcomes  Short Term Goal: A plan has been developed with personal nutrition goals set during dietitian appointment.       Nutrition  Assessments: Nutrition Assessments - 06/14/18 1630      MEDFICTS Scores   Pre Score  28       Nutrition Goals Re-Evaluation: Nutrition Goals Re-Evaluation    Row Name 06/14/18 1629             Goals   Current Weight  173 lb 15.1 oz (78.9 kg)          Nutrition Goals Re-Evaluation: Nutrition Goals Re-Evaluation    Row Name 06/14/18 1629             Goals   Current Weight  173 lb 15.1 oz (78.9 kg)          Nutrition Goals Discharge (Final Nutrition Goals Re-Evaluation): Nutrition Goals Re-Evaluation - 06/14/18 1629      Goals   Current Weight  173 lb 15.1 oz (78.9 kg)       Psychosocial: Target Goals: Acknowledge presence or absence of significant depression and/or stress, maximize coping skills, provide positive support system. Participant is able to verbalize types and ability to use techniques and skills needed for reducing stress and depression.  Initial Review & Psychosocial Screening: Initial Psych Review & Screening - 07/14/18 1555      Initial Review   Current issues with  Current Stress Concerns    Source of Stress Concerns  Chronic Illness      Family Dynamics   Good Support System?  Yes   Marland Mcalpine has her son, daughter in law and nephew for support     Barriers   Psychosocial barriers to participate in program  There are no identifiable barriers or psychosocial needs.      Screening Interventions   Interventions  Encouraged to exercise    Expected Outcomes  Short Term goal: Utilizing psychosocial counselor, staff and physician to assist with identification of specific Stressors or current issues interfering with healing process. Setting desired goal for each stressor or current issue identified.;Long Term Goal: Stressors or current issues are controlled or eliminated.;Short Term goal: Identification and review with participant of any Quality of Life or Depression concerns found by scoring the questionnaire.       Quality of Life  Scores: Quality of Life - 06/14/18 1438      Quality of Life   Select  Quality of Life      Quality of Life Scores   Health/Function Pre  15.7 %    Socioeconomic Pre  22.8 %    Psych/Spiritual Pre  24.4 %    Family Pre  24.6 %    GLOBAL Pre  20.36 %      Scores of 19 and below usually indicate a poorer quality of life in these areas.  A difference of  2-3 points is a clinically meaningful difference.  A difference of 2-3 points in the total score of the Quality of Life Index has been associated with significant improvement in overall quality of life, self-image, physical symptoms, and general health in studies assessing change in quality of life.  PHQ-9: Recent Review Flowsheet Data    Depression screen [facility] 2/9 06/20/2018   Decreased Interest 0   Down, Depressed, Hopeless 0   PHQ - 2 Score 0     Interpretation of Total Score  Total Score Depression Severity:  1-4 =  Minimal depression, 5-9 = Mild depression, 10-14 = Moderate depression, 15-19 = Moderately severe depression, 20-27 = Severe depression   Psychosocial Evaluation and Intervention:   Psychosocial Re-Evaluation: Psychosocial Re-Evaluation    [place] Name 07/14/18 1555 07/14/18 1558 08/11/18 1505         Psychosocial Re-Evaluation   Current issues with  Current Stress Concerns  Current Stress Concerns  Current Stress Concerns     Comments  -  -  [place] has not voiced any      Interventions  -  Stress management education;Encouraged to attend Cardiac Rehabilitation for the exercise  Stress management education;Encouraged to attend Cardiac Rehabilitation for the exercise       Initial Review   Source of Stress Concerns  -  Chronic Illness  Chronic Illness        Psychosocial Discharge (Final Psychosocial Re-Evaluation): Psychosocial Re-Evaluation - 08/11/18 1505      Psychosocial Re-Evaluation   Current issues with  Current Stress Concerns    Comments  Avyn has not voiced any     Interventions  Stress  management education;Encouraged to attend Cardiac Rehabilitation for the exercise      Initial Review   Source of Stress Concerns  Chronic Illness       Vocational Rehabilitation: Provide vocational rehab assistance to qualifying candidates.   Vocational Rehab Evaluation & Intervention:   Education: Education Goals: Education classes will be provided on a weekly basis, covering required topics. Participant will state understanding/return demonstration of topics presented.  Learning Barriers/Preferences: Learning Barriers/Preferences - 06/14/18 1542      Learning Barriers/Preferences   Learning Barriers  Sight    Learning Preferences  Audio;Group Instruction;Individual Instruction;Pictoral;Skilled Demonstration;Verbal Instruction;Video;Written Material       Education Topics: Count Your Pulse:  -Group instruction provided by verbal instruction, demonstration, patient participation and written materials to support subject.  Instructors address importance of being able to find your pulse and how to count your pulse when at home without a heart monitor.  Patients get hands on experience counting their pulse with staff help and individually.   Heart Attack, Angina, and Risk Factor Modification:  -Group instruction provided by verbal instruction, video, and written materials to support subject.  Instructors address signs and symptoms of angina and heart attacks.    Also discuss risk factors for heart disease and how to make changes to improve heart health risk factors.   Functional Fitness:  -Group instruction provided by verbal instruction, demonstration, patient participation, and written materials to support subject.  Instructors address safety measures for doing things around the house.  Discuss how to get up and down off the floor, how to pick things up properly, how to safely get out of a chair without assistance, and balance training.   CARDIAC REHAB PHASE II EXERCISE from  08/10/2018 in [place]  Date  07/15/18  Instruction Review Code  2- Demonstrated Understanding      Meditation and Mindfulness:  -Group instruction provided by verbal instruction, patient participation, and written materials to support subject.  Instructor addresses importance of mindfulness and meditation practice to help reduce stress and improve awareness.  Instructor also leads participants through a meditation exercise.    Stretching for Flexibility and Mobility:  -Group instruction provided by verbal instruction, patient participation, and written materials to support subject.  Instructors lead participants through series of stretches that are designed to increase flexibility thus improving mobility.  These stretches are additional exercise for major muscle groups  that are typically performed during regular warm up and cool down.   Hands Only CPR:  -Group verbal, video, and participation provides a basic overview of AHA guidelines for community CPR. Role-play of emergencies allow participants the opportunity to practice calling for help and chest compression technique with discussion of AED use.   Hypertension: -Group verbal and written instruction that provides a basic overview of hypertension including the most recent diagnostic guidelines, risk factor reduction with self-care instructions and medication management.   CARDIAC REHAB PHASE II EXERCISE from 08/10/2018 in [place]  Date  08/05/18  Instruction Review Code  2- Demonstrated Understanding       Nutrition I class: Heart Healthy Eating:  -Group instruction provided by PowerPoint slides, verbal discussion, and written materials to support subject matter. The instructor gives an explanation and review of the Therapeutic Lifestyle Changes diet recommendations, which includes a discussion on lipid goals, dietary fat, sodium, fiber, plant stanol/sterol esters,  sugar, and the components of a well-balanced, healthy diet.   Nutrition II class: Lifestyle Skills:  -Group instruction provided by PowerPoint slides, verbal discussion, and written materials to support subject matter. The instructor gives an explanation and review of label reading, grocery shopping for heart health, heart healthy recipe modifications, and ways to make healthier choices when eating out.   Diabetes Question & Answer:  -Group instruction provided by PowerPoint slides, verbal discussion, and written materials to support subject matter. The instructor gives an explanation and review of diabetes co-morbidities, pre- and post-prandial blood glucose goals, pre-exercise blood glucose goals, signs, symptoms, and treatment of hypoglycemia and hyperglycemia, and foot care basics.   Diabetes Blitz:  -Group instruction provided by PowerPoint slides, verbal discussion, and written materials to support subject matter. The instructor gives an explanation and review of the physiology behind type 1 and type 2 diabetes, diabetes medications and rational behind using different medications, pre- and post-prandial blood glucose recommendations and Hemoglobin A1c goals, diabetes diet, and exercise including blood glucose guidelines for exercising safely.    Portion Distortion:  -Group instruction provided by PowerPoint slides, verbal discussion, written materials, and food models to support subject matter. The instructor gives an explanation of serving size versus portion size, changes in portions sizes over the last 20 years, and what consists of a serving from each food group.   Stress Management:  -Group instruction provided by verbal instruction, video, and written materials to support subject matter.  Instructors review role of stress in heart disease and how to cope with stress positively.     Exercising on Your Own:  -Group instruction provided by verbal instruction, power point, and written  materials to support subject.  Instructors discuss benefits of exercise, components of exercise, frequency and intensity of exercise, and end points for exercise.  Also discuss use of nitroglycerin and activating EMS.  Review options of places to exercise outside of rehab.  Review guidelines for sex with heart disease.   CARDIAC REHAB PHASE II EXERCISE from 08/10/2018 in [place]  Date  07/20/18  Educator  EP  Instruction Review Code  2- Demonstrated Understanding      Cardiac Drugs I:  -Group instruction provided by verbal instruction and written materials to support subject.  Instructor reviews cardiac drug classes: antiplatelets, anticoagulants, beta blockers, and statins.  Instructor discusses reasons, side effects, and lifestyle considerations for each drug class.   CARDIAC REHAB PHASE II EXERCISE from 08/10/2018 in [place]  Date  08/10/18  Educator  Pharmacist  Instruction Review Code  2- Demonstrated Understanding      Cardiac Drugs II:  -Group instruction provided by verbal instruction and written materials to support subject.  Instructor reviews cardiac drug classes: angiotensin converting enzyme inhibitors (ACE-I), angiotensin II receptor blockers (ARBs), nitrates, and calcium channel blockers.  Instructor discusses reasons, side effects, and lifestyle considerations for each drug class.   Anatomy and Physiology of the Circulatory System:  Group verbal and written instruction and models provide basic cardiac anatomy and physiology, with the coronary electrical and arterial systems. Review of: AMI, Angina, Valve disease, Heart Failure, Peripheral Artery Disease, Cardiac Arrhythmia, Pacemakers, and the ICD.   CARDIAC REHAB PHASE II EXERCISE from 08/10/2018 in [place]  Date  06/29/18  Educator  RN  Instruction Review Code  2- Demonstrated Understanding      Other Education:  -Group  or individual verbal, written, or video instructions that support the educational goals of the cardiac rehab program.   Holiday Eating Survival Tips:  -Group instruction provided by PowerPoint slides, verbal discussion, and written materials to support subject matter. The instructor gives patients tips, tricks, and techniques to help them not only survive but enjoy the holidays despite the onslaught of food that accompanies the holidays.   Knowledge Questionnaire Score: Knowledge Questionnaire Score - 06/14/18 1438      Knowledge Questionnaire Score   Pre Score  21/24       Core Components/Risk Factors/Patient Goals at Admission: Personal Goals and Risk Factors at Admission - 06/14/18 1542      Core Components/Risk Factors/Patient Goals on Admission    Weight Management  Yes;Obesity;Weight Maintenance;Weight Loss    Intervention  Weight Management: Develop a combined nutrition and exercise program designed to reach desired caloric intake, while maintaining appropriate intake of nutrient and fiber, sodium and fats, and appropriate energy expenditure required for the weight goal.;Weight Management: Provide education and appropriate resources to help participant work on and attain dietary goals.;Weight Management/Obesity: Establish reasonable short term and long term weight goals.;Obesity: Provide education and appropriate resources to help participant work on and attain dietary goals.    Admit Weight  175 lb 14.8 oz (79.8 kg)    Expected Outcomes  Short Term: Continue to assess and modify interventions until short term weight is achieved;Long Term: Adherence to nutrition and physical activity/exercise program aimed toward attainment of established weight goal;Weight Maintenance: Understanding of the daily nutrition guidelines, which includes 25-35% calories from fat, 7% or less cal from saturated fats, less than 258m cholesterol, less than 1.5gm of sodium, & 5 or more servings of fruits and  vegetables daily;Weight Loss: Understanding of general recommendations for a balanced deficit meal plan, which promotes 1-2 lb weight loss per week and includes a negative energy balance of [phone] kcal/d;Understanding recommendations for meals to include 15-35% energy as protein, 25-35% energy from fat, 35-60% energy from carbohydrates, less than 2061mof dietary cholesterol, 20-35 gm of total fiber daily;Understanding of distribution of calorie intake throughout the day with the consumption of 4-5 meals/snacks    Diabetes  Yes    Intervention  Provide education about signs/symptoms and action to take for hypo/hyperglycemia.;Provide education about proper nutrition, including hydration, and aerobic/resistive exercise prescription along with prescribed medications to achieve blood glucose in normal ranges: Fasting glucose 65-99 mg/dL    Expected Outcomes  Short Term: Participant verbalizes understanding of the signs/symptoms and immediate care of hyper/hypoglycemia, proper foot care and importance of  medication, aerobic/resistive exercise and nutrition plan for blood glucose control.;Long Term: Attainment of HbA1C < 7%.    Hypertension  Yes    Intervention  Provide education on lifestyle modifcations including regular physical activity/exercise, weight management, moderate sodium restriction and increased consumption of fresh fruit, vegetables, and low fat dairy, alcohol moderation, and smoking cessation.;Monitor prescription use compliance.    Expected Outcomes  Short Term: Continued assessment and intervention until BP is < 140/48m HG in hypertensive participants. < 130/869mHG in hypertensive participants with diabetes, heart failure or chronic kidney disease.;Long Term: Maintenance of blood pressure at goal levels.    Lipids  Yes    Intervention  Provide education and support for participant on nutrition & aerobic/resistive exercise along with prescribed medications to achieve LDL <7025mHDL >41m84m   Expected Outcomes  Short Term: Participant states understanding of desired cholesterol values and is compliant with medications prescribed. Participant is following exercise prescription and nutrition guidelines.;Long Term: Cholesterol controlled with medications as prescribed, with individualized exercise RX and with personalized nutrition plan. Value goals: LDL < 70mg41mL > 40 mg.    Stress  Yes    Intervention  Offer individual and/or small group education and counseling on adjustment to heart disease, stress management and health-related lifestyle change. Teach and support self-help strategies.;Refer participants experiencing significant psychosocial distress to appropriate mental health specialists for further evaluation and treatment. When possible, include family members and significant others in education/counseling sessions.    Expected Outcomes  Short Term: Participant demonstrates changes in health-related behavior, relaxation and other stress management skills, ability to obtain effective social support, and compliance with psychotropic medications if prescribed.;Long Term: Emotional wellbeing is indicated by absence of clinically significant psychosocial distress or social isolation.       Core Components/Risk Factors/Patient Goals Review:  Goals and Risk Factor Review    Row Name 06/23/18 1342 07/14/18 1559 08/11/18 1508         Core Components/Risk Factors/Patient Goals Review   Personal Goals Review  Weight Management/Obesity;Lipids;Diabetes;Hypertension  Weight Management/Obesity;Lipids;Diabetes;Hypertension;Stress  Weight Management/Obesity;Lipids;Diabetes;Hypertension;Stress     Review  BarabMarland Mcalpineted exercise at cardiac rehab this week. Barabara's vital signs and CBG's have been stable so far. BarbaKameryn not check her cBG's at home.  Shenica's vital signs have been stable at phase 2 cardiac rehab. Peachie's CBG's have beens table at cardiac rehab. We continue to check  Yaqueline's CBG's at cardiac rehab as she does not check them at home  Mackinsey's vital signs have been stable at phase 2 cardiac rehab. Latrisha's CBG's have beens table at cardiac rehab. We continue to check Abeni's CBG's at cardiac rehab as she does not check them at home     Expected Outcomes  Patient will participate in cardiac rehab for exercise, diet and lifestyle modifcations. Will encourage BarbaDarnetteear how to check her own CBG's so she can check her CBg's at home.  Patient will participate in cardiac rehab for exercise, diet and lifestyle modifcations. Will encourage BarbaMaicieear how to check her own CBG's so she can check her CBg's at home.  Patient will participate in cardiac rehab for exercise, diet and lifestyle modifcations. BarbaIyshano desire to check her own CBG's and does nto check them at home.        Core Components/Risk Factors/Patient Goals at Discharge (Final Review):  Goals and Risk Factor Review - 08/11/18 1508      Core Components/Risk Factors/Patient Goals Review   Personal Goals Review  Weight Management/Obesity;Lipids;Diabetes;Hypertension;Stress    Review  Nylee's vital signs have been stable at phase 2 cardiac rehab. Torey's CBG's have beens table at cardiac rehab. We continue to check Micaela's CBG's at cardiac rehab as she does not check them at home    Expected Outcomes  Patient will participate in cardiac rehab for exercise, diet and lifestyle modifcations. Charle has no desire to check her own CBG's and does nto check them at home.       ITP Comments: ITP Comments    Row Name 06/14/18 1435 06/22/18 1446 07/14/18 1554 08/11/18 1505     ITP Comments  Dr.Traci Radford Pax, Medical Director   30 day ITP Review. Xzaria started exercise at cardiac rehab on Monday 06/20/18  30 day ITP Review. Leea has good attendance and participation in phase 2 cardiac rehab.  30 day ITP Review. Corneshia has good attendance and participation in phase 2 cardiac rehab.        Comments: See ITP comments. Rosalva remains in atrial fibrillation at a controlled rate. Patient remains asymptomatic with exercise.Barnet Pall, RN,BSN 08/11/2018 3:13 PM

## 2018-08-12 ENCOUNTER — Ambulatory Visit (HOSPITAL_COMMUNITY): Payer: Medicare Other

## 2018-08-12 ENCOUNTER — Encounter (HOSPITAL_COMMUNITY)
Admission: RE | Admit: 2018-08-12 | Discharge: 2018-08-12 | Disposition: A | Discharge status: Home / Self Care | Payer: Medicare Other | Source: Ambulatory Visit | Attending: Cardiology | Admitting: Cardiology

## 2018-08-12 DIAGNOSIS — I214 Non-ST elevation (NSTEMI) myocardial infarction: Secondary | ICD-10-CM

## 2018-08-12 DIAGNOSIS — Z955 Presence of coronary angioplasty implant and graft: Secondary | ICD-10-CM

## 2018-08-15 ENCOUNTER — Encounter (HOSPITAL_COMMUNITY)
Admission: RE | Admit: 2018-08-15 | Discharge: 2018-08-15 | Disposition: A | Discharge status: Home / Self Care | Payer: Medicare Other | Source: Ambulatory Visit | Attending: Cardiology | Admitting: Cardiology

## 2018-08-15 ENCOUNTER — Ambulatory Visit (HOSPITAL_COMMUNITY): Payer: Medicare Other

## 2018-08-15 DIAGNOSIS — Z955 Presence of coronary angioplasty implant and graft: Secondary | ICD-10-CM

## 2018-08-15 DIAGNOSIS — I214 Non-ST elevation (NSTEMI) myocardial infarction: Secondary | ICD-10-CM

## 2018-08-15 LAB — GLUCOSE, CAPILLARY
Glucose-Capillary: 108 mg/dL — ABNORMAL HIGH (ref 70–99)
Glucose-Capillary: 103 mg/dL — ABNORMAL HIGH (ref 70–99)

## 2018-08-17 ENCOUNTER — Ambulatory Visit (HOSPITAL_COMMUNITY): Payer: Medicare Other

## 2018-08-17 ENCOUNTER — Encounter (HOSPITAL_COMMUNITY): Payer: Medicare Other

## 2018-08-19 ENCOUNTER — Ambulatory Visit (HOSPITAL_COMMUNITY): Payer: Medicare Other

## 2018-08-19 ENCOUNTER — Encounter (HOSPITAL_COMMUNITY)
Admission: RE | Admit: 2018-08-19 | Discharge: 2018-08-19 | Disposition: A | Discharge status: Home / Self Care | Payer: Medicare Other | Source: Ambulatory Visit | Attending: Cardiology | Admitting: Cardiology

## 2018-08-19 DIAGNOSIS — I214 Non-ST elevation (NSTEMI) myocardial infarction: Secondary | ICD-10-CM | POA: Diagnosis not present

## 2018-08-19 DIAGNOSIS — Z955 Presence of coronary angioplasty implant and graft: Secondary | ICD-10-CM

## 2018-08-19 LAB — GLUCOSE, CAPILLARY: Glucose-Capillary: 94 mg/dL (ref 70–99)

## 2018-08-22 ENCOUNTER — Encounter (HOSPITAL_COMMUNITY)
Admission: RE | Admit: 2018-08-22 | Discharge: 2018-08-22 | Disposition: A | Discharge status: Home / Self Care | Payer: Medicare Other | Source: Ambulatory Visit | Attending: Cardiology | Admitting: Cardiology

## 2018-08-22 ENCOUNTER — Ambulatory Visit (HOSPITAL_COMMUNITY): Payer: Medicare Other

## 2018-08-22 DIAGNOSIS — I214 Non-ST elevation (NSTEMI) myocardial infarction: Secondary | ICD-10-CM | POA: Diagnosis not present

## 2018-08-22 DIAGNOSIS — Z955 Presence of coronary angioplasty implant and graft: Secondary | ICD-10-CM

## 2018-08-22 LAB — GLUCOSE, CAPILLARY
GLUCOSE-CAPILLARY: 83 mg/dL (ref 70–99)
Glucose-Capillary: 94 mg/dL (ref 70–99)

## 2018-08-22 NOTE — Progress Notes (Signed)
Incomplete Session Note  Patient Details  Name: Kristine Oliver MRN: [id number] Date of Birth: [birth year] Referring Provider:     CARDIAC REHAB PHASE II ORIENTATION from 06/14/2018 in [place]  Referring Provider  Dr. Van Clines [name] did not complete her rehab session.  CBG 83. No exercise per protocol. Patient said she ate a protein bar but didn't not eat lunch today because she was too busy. Patient was given a banana and a lemonade and was encouraged to make sure she eats prior to coming to exercise again. Patient was counseled by the dietitian. Recheck CBG 94.Barnet Pall, RN,BSN 08/22/2018 3:11 PM

## 2018-08-22 NOTE — Progress Notes (Signed)
Kristine Oliver 85 y.o. female Nutrition Note Pt presented with low blood sugar today. Reminded pt of discussions in December, and my recommendations to eat a snack before exercise to ensure her blood sugar levels are not too low. Set a goal with patient that she needs to eat regularly across the day and eat a snack before exercise to ensure her blood glucose levels are in a normal range. Per pt report she slept in until 11:30 this morning did not eat breakfast until 12. Discussed setting an alarm to get up earlier and eat a meal at 11 am to leave room for pt to have a snack at 1:30 before she leaves for rehab. Pt reports not checking her blood sugar, and refuses to check because she finds it extremely painful to do finger pricks for blood. Reviewed the benefits of monitoring blood glucose daily with patient and discussed the benefits of a continuous blood glucose monitor with patient. Recommended pt call medical provider today to set up appointment for new monitor that she will use regularly. Additionally set up regular eating times with patient to help ensure blood sugar will not fall too low. Reviewed with patient what a serving of carbohydrates looks like, and discussed quick and easy snacks she could have before working out to ensure blood sugar does not fall too low before or during exercise. Pt verbalized understanding via feedback method.    Laurina Bustle, MS, RD, LDN 08/22/2018 3:51 PM

## 2018-08-24 ENCOUNTER — Ambulatory Visit (HOSPITAL_COMMUNITY): Payer: Medicare Other

## 2018-08-24 ENCOUNTER — Encounter (HOSPITAL_COMMUNITY)
Admission: RE | Admit: 2018-08-24 | Discharge: 2018-08-24 | Disposition: A | Discharge status: Home / Self Care | Payer: Medicare Other | Source: Ambulatory Visit | Attending: Cardiology | Admitting: Cardiology

## 2018-08-24 DIAGNOSIS — I214 Non-ST elevation (NSTEMI) myocardial infarction: Secondary | ICD-10-CM

## 2018-08-24 DIAGNOSIS — Z955 Presence of coronary angioplasty implant and graft: Secondary | ICD-10-CM

## 2018-08-24 LAB — GLUCOSE, CAPILLARY: GLUCOSE-CAPILLARY: 102 mg/dL — AB (ref 70–99)

## 2018-08-25 ENCOUNTER — Ambulatory Visit (HOSPITAL_COMMUNITY): Payer: Medicare Other | Admitting: Anesthesiology

## 2018-08-25 ENCOUNTER — Encounter (HOSPITAL_COMMUNITY): Payer: Self-pay | Admitting: *Deleted

## 2018-08-25 ENCOUNTER — Encounter (HOSPITAL_COMMUNITY)
Admission: RE | Disposition: A | Discharge status: Home / Self Care | Payer: Self-pay | Source: Home / Self Care | Attending: Cardiology

## 2018-08-25 ENCOUNTER — Other Ambulatory Visit: Payer: Self-pay

## 2018-08-25 ENCOUNTER — Ambulatory Visit (HOSPITAL_COMMUNITY)
Admission: RE | Admit: 2018-08-25 | Discharge: 2018-08-25 | Disposition: A | Discharge status: Home / Self Care | Payer: Medicare Other | Attending: Cardiology | Admitting: Cardiology

## 2018-08-25 DIAGNOSIS — Z888 Allergy status to other drugs, medicaments and biological substances status: Secondary | ICD-10-CM | POA: Insufficient documentation

## 2018-08-25 DIAGNOSIS — I25119 Atherosclerotic heart disease of native coronary artery with unspecified angina pectoris: Secondary | ICD-10-CM | POA: Diagnosis not present

## 2018-08-25 DIAGNOSIS — G8929 Other chronic pain: Secondary | ICD-10-CM | POA: Diagnosis not present

## 2018-08-25 DIAGNOSIS — Z79899 Other long term (current) drug therapy: Secondary | ICD-10-CM | POA: Insufficient documentation

## 2018-08-25 DIAGNOSIS — Z6835 Body mass index (BMI) 35.0-35.9, adult: Secondary | ICD-10-CM | POA: Insufficient documentation

## 2018-08-25 DIAGNOSIS — E785 Hyperlipidemia, unspecified: Secondary | ICD-10-CM | POA: Diagnosis not present

## 2018-08-25 DIAGNOSIS — F319 Bipolar disorder, unspecified: Secondary | ICD-10-CM | POA: Insufficient documentation

## 2018-08-25 DIAGNOSIS — Z7902 Long term (current) use of antithrombotics/antiplatelets: Secondary | ICD-10-CM | POA: Diagnosis not present

## 2018-08-25 DIAGNOSIS — I252 Old myocardial infarction: Secondary | ICD-10-CM | POA: Diagnosis not present

## 2018-08-25 DIAGNOSIS — E1122 Type 2 diabetes mellitus with diabetic chronic kidney disease: Secondary | ICD-10-CM | POA: Diagnosis not present

## 2018-08-25 DIAGNOSIS — I130 Hypertensive heart and chronic kidney disease with heart failure and stage 1 through stage 4 chronic kidney disease, or unspecified chronic kidney disease: Secondary | ICD-10-CM | POA: Diagnosis not present

## 2018-08-25 DIAGNOSIS — Z7901 Long term (current) use of anticoagulants: Secondary | ICD-10-CM | POA: Diagnosis not present

## 2018-08-25 DIAGNOSIS — E0590 Thyrotoxicosis, unspecified without thyrotoxic crisis or storm: Secondary | ICD-10-CM | POA: Insufficient documentation

## 2018-08-25 DIAGNOSIS — I440 Atrioventricular block, first degree: Secondary | ICD-10-CM | POA: Insufficient documentation

## 2018-08-25 DIAGNOSIS — N183 Chronic kidney disease, stage 3 (moderate): Secondary | ICD-10-CM | POA: Diagnosis not present

## 2018-08-25 DIAGNOSIS — F419 Anxiety disorder, unspecified: Secondary | ICD-10-CM | POA: Diagnosis not present

## 2018-08-25 DIAGNOSIS — E669 Obesity, unspecified: Secondary | ICD-10-CM | POA: Diagnosis not present

## 2018-08-25 DIAGNOSIS — K589 Irritable bowel syndrome without diarrhea: Secondary | ICD-10-CM | POA: Diagnosis not present

## 2018-08-25 DIAGNOSIS — I4819 Other persistent atrial fibrillation: Secondary | ICD-10-CM | POA: Diagnosis present

## 2018-08-25 DIAGNOSIS — I5032 Chronic diastolic (congestive) heart failure: Secondary | ICD-10-CM | POA: Insufficient documentation

## 2018-08-25 DIAGNOSIS — K219 Gastro-esophageal reflux disease without esophagitis: Secondary | ICD-10-CM | POA: Insufficient documentation

## 2018-08-25 DIAGNOSIS — E1140 Type 2 diabetes mellitus with diabetic neuropathy, unspecified: Secondary | ICD-10-CM | POA: Diagnosis not present

## 2018-08-25 DIAGNOSIS — Z87891 Personal history of nicotine dependence: Secondary | ICD-10-CM | POA: Insufficient documentation

## 2018-08-25 DIAGNOSIS — Z7984 Long term (current) use of oral hypoglycemic drugs: Secondary | ICD-10-CM | POA: Insufficient documentation

## 2018-08-25 DIAGNOSIS — M419 Scoliosis, unspecified: Secondary | ICD-10-CM | POA: Diagnosis not present

## 2018-08-25 DIAGNOSIS — R001 Bradycardia, unspecified: Secondary | ICD-10-CM | POA: Insufficient documentation

## 2018-08-25 DIAGNOSIS — Z955 Presence of coronary angioplasty implant and graft: Secondary | ICD-10-CM | POA: Insufficient documentation

## 2018-08-25 HISTORY — PX: CARDIOVERSION: SHX1299

## 2018-08-25 LAB — GLUCOSE, CAPILLARY
Glucose-Capillary: 164 mg/dL — ABNORMAL HIGH (ref 70–99)
Glucose-Capillary: 66 mg/dL — ABNORMAL LOW (ref 70–99)

## 2018-08-25 SURGERY — CARDIOVERSION
Anesthesia: General

## 2018-08-25 MED ORDER — DEXTROSE 50 % IV SOLN
INTRAVENOUS | Status: AC
  Filled 2018-08-25: qty 50

## 2018-08-25 MED ORDER — PROPOFOL 10 MG/ML IV BOLUS
INTRAVENOUS | Status: DC | PRN
  Administered 2018-08-25: 60 mg via INTRAVENOUS

## 2018-08-25 MED ORDER — SODIUM CHLORIDE 0.9 % IV SOLN
INTRAVENOUS | Status: DC | PRN
  Administered 2018-08-25: 12:00:00 via INTRAVENOUS

## 2018-08-25 MED ORDER — LIDOCAINE 2% (20 MG/ML) 5 ML SYRINGE
INTRAMUSCULAR | Status: DC | PRN
  Administered 2018-08-25: 100 mg via INTRAVENOUS

## 2018-08-25 NOTE — CV Procedure (Signed)
    Electrical Cardioversion Procedure Note Kristine Oliver [id number] 06/05/1931  Procedure: Electrical Cardioversion Indications:  Atrial Fibrillation  Time Out: Verified patient identification, verified procedure,medications/allergies/relevent history reviewed, required imaging and test results available.  Performed  Procedure Details  The patient was NPO after midnight. Anesthesia was administered at the beside  by Dr. Tobias Oliver with propofol.  Cardioversion was performed with synchronized biphasic defibrillation via AP pads with 120 joules.  1 attempt(s) were performed.  The patient converted to normal sinus rhythm. The patient tolerated the procedure well   IMPRESSION:  Successful cardioversion of atrial fibrillation to NSR with first degree AVB.    Kristine Oliver 08/25/2018, 12:21 PM

## 2018-08-25 NOTE — Progress Notes (Signed)
Hypoglycemic Event  CBG: 66  Treatment: D50 25 mL (12.5 gm) and D50 50 mL (25 gm)  Symptoms: None  Follow-up CBG: Time:1225 CBG Result:164  Possible Reasons for Event: Inadequate meal intake  Comments/MD notified: Dr. Marlou Porch, no new orders    Vonda Antigua

## 2018-08-25 NOTE — Anesthesia Preprocedure Evaluation (Addendum)
Anesthesia Evaluation  Patient identified by MRN, date of birth, ID band Patient awake    Reviewed: Allergy & Precautions, NPO status , Patient's Chart, lab work & pertinent test results  Airway Mallampati: II  TM Distance: >3 FB Neck ROM: Full    Dental  (+) Upper Dentures, Lower Dentures, Dental Advisory Given   Pulmonary former smoker,    Pulmonary exam normal        Cardiovascular hypertension, Pt. on medications + CAD, + Past MI, + Cardiac Stents and +CHF  Normal cardiovascular exam+ dysrhythmias (on Eliquis) Atrial Fibrillation   TTE 8/19: EF 16-10%, grade 1 diastolic dysfunction, mild MR, mild LAE, mild TR  Cath 8/19: prox RCA to mid RCA lesion 25% stenosed, mid Cx lesion 90% stenosed-DES placed, ost Cx to Prox Cx lesion 50% stenosed, previously placed mid LAD stent widely patent, prox LAD lesion 25% stenosed     Neuro/Psych Anxiety Bipolar Disorder Spinal stenosis    GI/Hepatic Neg liver ROS, GERD  Medicated,  Endo/Other  diabetes, Type 2, Oral Hypoglycemic AgentsHypothyroidism   Renal/GU Renal InsufficiencyRenal disease     Musculoskeletal  (+) Arthritis ,   Abdominal   Peds  Hematology negative hematology ROS (+)   Anesthesia Other Findings Day of surgery medications reviewed with the patient.  Reproductive/Obstetrics                            Anesthesia Physical Anesthesia Plan  ASA: III  Anesthesia Plan: General   Post-op Pain Management:    Induction: Intravenous  PONV Risk Score and Plan: Propofol infusion and Treatment may vary due to age or medical condition  Airway Management Planned: Mask  Additional Equipment:   Intra-op Plan:   Post-operative Plan:   Informed Consent: I have reviewed the patients History and Physical, chart, labs and discussed the procedure including the risks, benefits and alternatives for the proposed anesthesia with the patient or  authorized representative who has indicated his/her understanding and acceptance.     Dental advisory given  Plan Discussed with: CRNA and Anesthesiologist  Anesthesia Plan Comments:        Anesthesia Quick Evaluation

## 2018-08-25 NOTE — Transfer of Care (Signed)
Immediate Anesthesia Transfer of Care Note  Patient: Kristine Oliver  Procedure(s) Performed: CARDIOVERSION (N/A )  Patient Location: Endoscopy Unit  Anesthesia Type:General  Level of Consciousness: drowsy  Airway & Oxygen Therapy: Patient Spontanous Breathing and Patient connected to face mask oxygen  Post-op Assessment: Report given to RN and Post -op Vital signs reviewed and stable  Post vital signs: Reviewed and stable  Last Vitals:  Vitals Value Taken Time  BP 102/35   Temp    Pulse 71   Resp 18   SpO2 94%     Last Pain:  Vitals:   08/25/18 1153  TempSrc: Oral  PainSc: 0-No pain         Complications: No apparent anesthesia complications

## 2018-08-25 NOTE — Anesthesia Postprocedure Evaluation (Signed)
Anesthesia Post Note  Patient: Kristine Oliver  Procedure(s) Performed: CARDIOVERSION (N/A )     Patient location during evaluation: PACU Anesthesia Type: General Level of consciousness: sedated Pain management: pain level controlled Vital Signs Assessment: post-procedure vital signs reviewed and stable Respiratory status: spontaneous breathing and respiratory function stable Cardiovascular status: stable Postop Assessment: no apparent nausea or vomiting Anesthetic complications: no    Last Vitals:  Vitals:   08/25/18 1240 08/25/18 1245  BP: (!) 99/41 (!) 103/33  Pulse: 68 66  Resp: 14 16  Temp:    SpO2: 93% 93%    Last Pain:  Vitals:   08/25/18 1245  TempSrc:   PainSc: 0-No pain                 Taiga Lupinacci DANIEL

## 2018-08-25 NOTE — Interval H&P Note (Signed)
History and Physical Interval Note:  08/25/2018 12:13 PM  CACI ORREN  has presented today for surgery, with the diagnosis of AFIB  The various methods of treatment have been discussed with the patient and family. After consideration of risks, benefits and other options for treatment, the patient has consented to  Procedure(s): CARDIOVERSION (N/A) as a surgical intervention .  The patient's history has been reviewed, patient examined, no change in status, stable for surgery.  I have reviewed the patient's chart and labs.  Questions were answered to the patient's satisfaction.     [employer]

## 2018-08-25 NOTE — Discharge Instructions (Signed)
Electrical Cardioversion, Care After °This sheet gives you information about how to care for yourself after your procedure. Your health care provider may also give you more specific instructions. If you have problems or questions, contact your health care provider. °What can I expect after the procedure? °After the procedure, it is common to have: °· Some redness on the skin where the shocks were given. °Follow these instructions at home: ° °· Do not drive for 24 hours if you were given a medicine to help you relax (sedative). °· Take over-the-counter and prescription medicines only as told by your health care provider. °· Ask your health care provider how to check your pulse. Check it often. °· Rest for 48 hours after the procedure or as told by your health care provider. °· Avoid or limit your caffeine use as told by your health care provider. °Contact a health care provider if: °· You feel like your heart is beating too quickly or your pulse is not regular. °· You have a serious muscle cramp that does not go away. °Get help right away if: ° °· You have discomfort in your chest. °· You are dizzy or you feel faint. °· You have trouble breathing or you are short of breath. °· Your speech is slurred. °· You have trouble moving an arm or leg on one side of your body. °· Your fingers or toes turn cold or blue. °This information is not intended to replace advice given to you by your health care provider. Make sure you discuss any questions you have with your health care provider. °Document Released: 05/10/2013 Document Revised: 02/21/2016 Document Reviewed: 01/24/2016 °Elsevier Interactive Patient Education © 2019 [employer] ° °

## 2018-08-26 ENCOUNTER — Ambulatory Visit (HOSPITAL_COMMUNITY): Payer: Medicare Other

## 2018-08-26 ENCOUNTER — Telehealth (HOSPITAL_COMMUNITY): Payer: Self-pay | Admitting: *Deleted

## 2018-08-26 ENCOUNTER — Telehealth: Payer: Self-pay | Admitting: Cardiology

## 2018-08-26 ENCOUNTER — Encounter (HOSPITAL_COMMUNITY): Payer: Medicare Other

## 2018-08-26 NOTE — Telephone Encounter (Signed)
Patient called today stating she had a cardioversion done yesterday by Dr. Marlou Porch, she states she needs a letter or a call to Cardiac rehab in order to return to them on Monday. Barnet Pall can be called at Cardiac Rehab patient did not have number.    She states she is feeling fine since the cardioversion, just feeling a little tired.

## 2018-08-28 ENCOUNTER — Encounter (HOSPITAL_COMMUNITY): Payer: Self-pay | Admitting: Cardiology

## 2018-08-29 ENCOUNTER — Telehealth: Payer: Self-pay | Admitting: Cardiology

## 2018-08-29 ENCOUNTER — Encounter (HOSPITAL_COMMUNITY): Payer: Medicare Other

## 2018-08-29 ENCOUNTER — Telehealth (HOSPITAL_COMMUNITY): Payer: Self-pay | Admitting: *Deleted

## 2018-08-29 ENCOUNTER — Ambulatory Visit (HOSPITAL_COMMUNITY): Payer: Medicare Other

## 2018-08-29 NOTE — Telephone Encounter (Signed)
Pt called to remind her of need for MD clearance before returning to cardiac rehab for exercise.  VM left for pt.

## 2018-08-29 NOTE — Telephone Encounter (Signed)
New message   Pt is calling because [facility] [phone] [place], says the PT cannot work out without permission from Dr Marlou Porch

## 2018-08-30 NOTE — Telephone Encounter (Signed)
Reviewed with Dr Marlou Porch who states pt is OK to resume Cardiac Rehabilitation.  Verdis Frederickson, RN aware and will notify the patient.

## 2018-08-31 ENCOUNTER — Encounter (HOSPITAL_COMMUNITY): Payer: Medicare Other

## 2018-08-31 ENCOUNTER — Ambulatory Visit (HOSPITAL_COMMUNITY): Payer: Medicare Other

## 2018-09-01 NOTE — Telephone Encounter (Signed)
She can restart.  Excellent. [name], MD

## 2018-09-02 ENCOUNTER — Encounter (HOSPITAL_COMMUNITY)
Admission: RE | Admit: 2018-09-02 | Discharge: 2018-09-02 | Disposition: A | Discharge status: Home / Self Care | Payer: Medicare Other | Source: Ambulatory Visit | Attending: Cardiology | Admitting: Cardiology

## 2018-09-02 ENCOUNTER — Ambulatory Visit (HOSPITAL_COMMUNITY): Payer: Medicare Other

## 2018-09-02 DIAGNOSIS — I214 Non-ST elevation (NSTEMI) myocardial infarction: Secondary | ICD-10-CM | POA: Diagnosis not present

## 2018-09-02 DIAGNOSIS — Z955 Presence of coronary angioplasty implant and graft: Secondary | ICD-10-CM

## 2018-09-02 LAB — GLUCOSE, CAPILLARY: Glucose-Capillary: 115 mg/dL — ABNORMAL HIGH (ref 70–99)

## 2018-09-02 NOTE — Progress Notes (Signed)
Ji returned to exercise at cardiac rehab today. Telemetry rhythm Sinus with a first degree heart block. CBG 115.Barnet Pall, RN,BSN 09/02/2018 4:05 PM

## 2018-09-05 ENCOUNTER — Encounter (HOSPITAL_COMMUNITY)
Admission: RE | Admit: 2018-09-05 | Discharge: 2018-09-05 | Disposition: A | Discharge status: Home / Self Care | Payer: Medicare Other | Source: Ambulatory Visit | Attending: Cardiology | Admitting: Cardiology

## 2018-09-05 ENCOUNTER — Ambulatory Visit (HOSPITAL_COMMUNITY): Payer: Medicare Other

## 2018-09-05 DIAGNOSIS — Z955 Presence of coronary angioplasty implant and graft: Secondary | ICD-10-CM | POA: Diagnosis not present

## 2018-09-05 DIAGNOSIS — I214 Non-ST elevation (NSTEMI) myocardial infarction: Secondary | ICD-10-CM | POA: Diagnosis not present

## 2018-09-05 LAB — GLUCOSE, CAPILLARY: Glucose-Capillary: 104 mg/dL — ABNORMAL HIGH (ref 70–99)

## 2018-09-07 ENCOUNTER — Encounter (HOSPITAL_COMMUNITY): Payer: Medicare Other

## 2018-09-07 ENCOUNTER — Ambulatory Visit (HOSPITAL_COMMUNITY): Payer: Medicare Other

## 2018-09-08 NOTE — Progress Notes (Signed)
Cardiac Individual Treatment Plan  Patient Details  Name: Kristine Oliver MRN: [id number] Date of Birth: 04-Nov-1930 Referring Provider:     CARDIAC REHAB PHASE II ORIENTATION from 06/14/2018 in [place]  Referring Provider  Dr. Marlou Porch      Initial Encounter Date:    CARDIAC REHAB PHASE II ORIENTATION from 06/14/2018 in [place]  Date  06/14/18      Visit Diagnosis: 08/16/2019NSTEMI (non-ST elevated myocardial infarction) [facility])  08/16/19Stented coronary artery  Patient's Home Medications on Admission:  Current Outpatient Medications:  .  ALPRAZolam (XANAX) 0.25 MG tablet, Take 0.25 mg by mouth daily as needed for anxiety. , Disp: , Rfl:  .  amLODipine (NORVASC) 5 MG tablet, Take 1 tablet (5 mg total) by mouth daily., Disp: 30 tablet, Rfl: 3 .  antiseptic oral rinse (BIOTENE) LIQD, 15 mLs by Mouth Rinse route as needed for dry mouth., Disp: , Rfl:  .  apixaban (ELIQUIS) 5 MG TABS tablet, Take 1 tablet (5 mg total) by mouth 2 (two) times daily., Disp: 60 tablet, Rfl: 11 .  ASPERCREME LIDOCAINE EX, Apply 1 application topically 2 (two) times daily as needed (back/feet pain)., Disp: , Rfl:  .  Carboxymethylcellulose Sod PF (RETAINE CMC) 0.5 % SOLN, Place 1 drop into both eyes 2 (two) times daily as needed (dry eyes)., Disp: , Rfl:  .  clindamycin (CLEOCIN T) 1 % lotion, Apply 1 application topically 2 (two) times daily. , Disp: , Rfl:  .  clopidogrel (PLAVIX) 75 MG tablet, Take 1 tablet (75 mg total) by mouth daily. (Patient taking differently: Take 75 mg by mouth every evening. ), Disp: 90 tablet, Rfl: 3 .  cycloSPORINE (RESTASIS) 0.05 % ophthalmic emulsion, Place 1 drop into both eyes 2 (two) times daily as needed (dry eyes). , Disp: , Rfl:  .  docusate sodium (COLACE) 100 MG capsule, Take 100 mg by mouth daily., Disp: , Rfl:  .  estrogen-methylTESTOSTERone 0.625-1.25 MG per tablet, Take 1 tablet by mouth daily., Disp: ,  Rfl:  .  furosemide (LASIX) 40 MG tablet, Take 40 mg by mouth daily., Disp: , Rfl:  .  gabapentin (NEURONTIN) 100 MG capsule, Take 100 mg by mouth 2 (two) times daily., Disp: , Rfl:  .  isosorbide mononitrate (IMDUR) 30 MG 24 hr tablet, TAKE 1 TABLET(30 MG) BY MOUTH DAILY (Patient taking differently: Take 30 mg by mouth daily. ** DO NOT CRUSH **), Disp: 30 tablet, Rfl: 2 .  levothyroxine (SYNTHROID, LEVOTHROID) 75 MCG tablet, Take 75 mcg by mouth daily., Disp: , Rfl:  .  Melatonin 3 MG TABS, Take 3 mg by mouth at bedtime as needed (sleep)., Disp: , Rfl:  .  metFORMIN (GLUCOPHAGE) 500 MG tablet, Take 500 mg by mouth 2 (two) times daily with a meal. , Disp: , Rfl: 0 .  nitroGLYCERIN (NITROSTAT) 0.4 MG SL tablet, Place 1 tablet (0.4 mg total) under the tongue every 5 (five) minutes x 3 doses as needed for chest pain., Disp: 25 tablet, Rfl: 3 .  Oxycodone HCl 10 MG TABS, Take 1 tablet by mouth 4 (four) times daily., Disp: , Rfl: 0 .  pantoprazole (PROTONIX) 40 MG tablet, Take 1 tablet (40 mg total) by mouth daily., Disp: 90 tablet, Rfl: 3 .  PREMARIN vaginal cream, Place 1 Applicatorful vaginally daily as needed (irritation). , Disp: , Rfl:  .  ramipril (ALTACE) 5 MG capsule, TAKE 1 CAPSULE(5 MG) BY MOUTH DAILY (Patient taking  differently: Take 5 mg by mouth daily. ), Disp: 90 capsule, Rfl: 2 .  rosuvastatin (CRESTOR) 20 MG tablet, Take 1 tablet (20 mg total) by mouth daily at 6 PM., Disp: 90 tablet, Rfl: 3 .  sodium chloride (OCEAN) 0.65 % SOLN nasal spray, Place 1 spray into both nostrils as needed for congestion (dryness)., Disp: , Rfl:  .  valACYclovir (VALTREX) 500 MG tablet, Take 500 mg by mouth 2 (two) times daily. , Disp: , Rfl:  .  vitamin B-12 (CYANOCOBALAMIN) 500 MCG tablet, Take 500 mcg by mouth daily., Disp: , Rfl:  .  zolpidem (AMBIEN) 10 MG tablet, Take 10 mg by mouth at bedtime as needed for sleep. , Disp: , Rfl:   Past Medical History: Past Medical History:  Diagnosis Date  .  Anxiety   . Bradycardia   . CAD (coronary artery disease)    a. 08/2004 NSTEMI/PCI: LAD 48md (2.75 x 28 mm Taxus 2 DES);  b. 10/2010 Cath: LM nl, LAD patent stents w/ jailed septal, LCX 30, RCA 30; c. 06/2013 MV: No ischemia. Small fixed anteroseptal defect, ? scar vs atten. EF 62%; d. 03/2016 MV: EF 57%, Low risk.  . Chronic diastolic heart failure ([place]    a. Echo 7/13:  mod LVH, EF 65-70%, vigorous LV, Gr 1 diast dysfn, mild LAE; b. 05/2017 Echo: EF 60-65%, mod LVH, no rwma, Gr1 DD, mildly dil LA.  .Kristine KitchenChronic pain disorder   . CKD (chronic kidney disease), stage III ([place]   . DDD (degenerative disc disease), lumbosacral    , Spinal stenosis  . Diabetes mellitus ([place]    , Type II  . GERD (gastroesophageal reflux disease)   . HTN (hypertension)   . Hyperlipidemia   . Hyperthyroidism   . IBS (irritable bowel syndrome)   . Lumbar disc disease   . Nonproliferative diabetic retinopathy associated with type 2 diabetes mellitus ([place]   . Obesity (BMI 30-39.9)   . Peripheral neuropathy    , Associated with DM  . Scoliosis     Tobacco Use: Social History   Tobacco Use  Smoking Status Former Smoker  . Years: 35.00  Smokeless Tobacco Never Used    Labs: Recent RChemical engineer   Labs for ITP Cardiac and Pulmonary Rehab Latest Ref Rng & Units 10/26/2010 02/13/2012 05/02/2012 05/19/2016 03/19/2018   Cholestrol 0 - 200 mg/dL 137 ATP III CLASSIFICATION: <200     mg/dL   Desirable 200-239  mg/dL   Borderline High >=240    mg/dL   High - 155 162 142   LDLCALC 0 - 99 mg/dL 65 Total Cholesterol/HDL:CHD Risk Coronary Heart Disease Risk Table Men   Women 1/2 Average Risk   3.4   3.3 Average Risk       5.0   4.4 2 X Average Risk   9.6   7.1 3 X Average Risk  23.4   11.0 Use the calculated Patient Ratio above and the CHD Risk Table to determine the patient's CHD Risk. ATP III CLASSIFICATION (LDL): <100     mg/dL   Optimal 100-129  mg/dL   Near or Above Optimal 130-159  mg/dL    Borderline 160-189  mg/dL   High >190     mg/dL   Very High - 80 103(H) 91   HDL >40 mg/dL 38(L) - 28(L) 31(L) 30(L)   Trlycerides <150 mg/dL 170(H) - 233(H) 138 103   Hemoglobin A1c <5.7 % 6.8 (NOTE)  According to the ADA Clinical Practice Recommendations for 2011, when HbA1c is used as a screening test:   >=6.5%   Diagnostic of Diabetes Mellitus           (if abnormal result is confirmed)  5.7-6.4%   Increased risk of developing Diabetes Mellitus  References:Diagnosis and Classification of Diabetes Mellitus,Diabetes ZOXW,9604,54(UJWJX 1):S62-S69 and Standards of Medical Care in         Diabetes - 2011,Diabetes [id number],62 (Suppl 1):S11-S61.(H) 6.7(H) - - -   TCO2 0 - 100 mmol/L 21 - - - -      Capillary Blood Glucose: Lab Results  Component Value Date   GLUCAP 104 (H) 09/05/2018   GLUCAP 115 (H) 09/02/2018   GLUCAP 164 (H) 08/25/2018   GLUCAP 66 (L) 08/25/2018   GLUCAP 102 (H) 08/24/2018     Exercise Target Goals: Exercise Program Goal: Individual exercise prescription set using results from initial 6 min walk test and THRR while considering  patient's activity barriers and safety.   Exercise Prescription Goal: Initial exercise prescription builds to 30-45 minutes a day of aerobic activity, 2-3 days per week.  Home exercise guidelines will be given to patient during program as part of exercise prescription that the participant will acknowledge.  Activity Barriers & Risk Stratification: Activity Barriers & Cardiac Risk Stratification - 06/14/18 1538      Activity Barriers & Cardiac Risk Stratification   Activity Barriers  Deconditioning;Muscular Weakness;Back Problems;Neck/Spine Problems;Assistive Device;Balance Concerns    Cardiac Risk Stratification  High       6 Minute Walk: 6 Minute Walk    Row Name 06/14/18 1537         6 Minute Walk   Phase  Initial     Distance  800 feet     Walk Time  6  minutes     # of Rest Breaks  2     MPH  1.51     METS  0.42     RPE  13     VO2 Peak  1.48     Symptoms  Yes (comment)     Comments  Fatigue     Resting HR  64 bpm     Resting BP  114/70     Resting Oxygen Saturation   96 %     Max Ex. HR  81 bpm     Max Ex. BP  118/70     2 Minute Post BP  122/68        Oxygen Initial Assessment:   Oxygen Re-Evaluation:   Oxygen Discharge (Final Oxygen Re-Evaluation):   Initial Exercise Prescription: Initial Exercise Prescription - 06/14/18 1600      Date of Initial Exercise RX and Referring Provider   Date  06/14/18    Referring Provider  Dr. Marlou Porch    Expected Discharge Date  09/23/18      NuStep   Level  2    SPM  75    Minutes  10    METs  1      Arm Ergometer   Level  1    Minutes  10    METs  1.3      Track   Laps  6    Minutes  10    METs  2      Prescription Details   Frequency (times per week)  3    Duration  Progress to 30 minutes of continuous aerobic without signs/symptoms of physical distress  Intensity   THRR 40-80% of Max Heartrate  54-108    Ratings of Perceived Exertion  11-13      Progression   Progression  Continue to progress workloads to maintain intensity without signs/symptoms of physical distress.      Resistance Training   Training Prescription  Yes    Weight  2 lbs.    Reps  10-15       Perform Capillary Blood Glucose checks as needed.  Exercise Prescription Changes: Exercise Prescription Changes    Row Name 06/20/18 1504 07/11/18 1456 07/18/18 1503 08/05/18 1509 08/15/18 1450     Response to Exercise   Blood Pressure (Admit)  120/70  118/78  104/70  114/60  110/60   Blood Pressure (Exercise)  118/84  122/74  118/72  134/72  108/78   Blood Pressure (Exit)  110/70  128/70  122/72  120/70  114/82   Heart Rate (Admit)  73 bpm  95 bpm  84 bpm  85 bpm  94 bpm   Heart Rate (Exercise)  74 bpm  119 bpm  104 bpm  98 bpm  104 bpm   Heart Rate (Exit)  73 bpm  94 bpm  75 bpm  85 bpm   93 bpm   Rating of Perceived Exertion (Exercise)  '13  13  13  14  14   ' Symptoms  none  none  none  none  none   Duration  Progress to 30 minutes of  aerobic without signs/symptoms of physical distress  Progress to 30 minutes of  aerobic without signs/symptoms of physical distress  Progress to 30 minutes of  aerobic without signs/symptoms of physical distress  Progress to 30 minutes of  aerobic without signs/symptoms of physical distress  Progress to 30 minutes of  aerobic without signs/symptoms of physical distress   Intensity  THRR unchanged  THRR unchanged  THRR unchanged  THRR unchanged  THRR unchanged     Progression   Progression  Continue to progress workloads to maintain intensity without signs/symptoms of physical distress.  Continue to progress workloads to maintain intensity without signs/symptoms of physical distress.  Continue to progress workloads to maintain intensity without signs/symptoms of physical distress.  Continue to progress workloads to maintain intensity without signs/symptoms of physical distress.  Continue to progress workloads to maintain intensity without signs/symptoms of physical distress.   Average METs  1.9  1.9  1.7  -  2     Resistance Training   Training Prescription  Yes  Yes  Yes  Yes  Yes   Weight  2 lbs.  2 lbs.  3lbs  2lbs  3lbs   Reps  10-15  10-15  10-15  10-15  10-15   Time  10 Minutes  10 Minutes  10 Minutes  10 Minutes  10 Minutes     Interval Training   Interval Training  No  No  No  No  No     NuStep   Level  '2  2  2  3  4   ' SPM  75  75  75  75  85   Minutes  '10  20  10  15  20   ' METs  1.7  1.9  1.7  -  2     Arm Ergometer   Level  1  -  -  -  -   Minutes  10  -  -  -  -     Track  Laps  '6  6  6  6  6   ' Minutes  '10  10  15 ' multiple stops  10  10   METs  2.05  2.05  1.7  -  2.03     Home Exercise Plan   Plans to continue exercise at  -  Home (comment) Walking  Home (comment) Walking  Home (comment) Walking  Home (comment) Walking    Frequency  -  Add 1 additional day to program exercise sessions.  Add 1 additional day to program exercise sessions.  Add 1 additional day to program exercise sessions.  Add 1 additional day to program exercise sessions.   Initial Home Exercises Provided  -  07/11/18  07/11/18  07/11/18  07/11/18   Row Name 09/02/18 1454             Response to Exercise   Blood Pressure (Admit)  122/80       Blood Pressure (Exercise)  124/80       Blood Pressure (Exit)  140/62       Heart Rate (Admit)  74 bpm       Heart Rate (Exercise)  94 bpm       Heart Rate (Exit)  68 bpm       Rating of Perceived Exertion (Exercise)  13       Symptoms  none       Duration  Progress to 30 minutes of  aerobic without signs/symptoms of physical distress       Intensity  THRR unchanged         Progression   Progression  Continue to progress workloads to maintain intensity without signs/symptoms of physical distress.       Average METs  2         Resistance Training   Training Prescription  Yes       Weight  3lbs       Reps  10-15       Time  10 Minutes         Interval Training   Interval Training  No         NuStep   Level  4       SPM  85       Minutes  22       METs  2         Track   Laps  5       Minutes  8       METs  2.09         Home Exercise Plan   Plans to continue exercise at  Home (comment) Walking       Frequency  Add 1 additional day to program exercise sessions.       Initial Home Exercises Provided  07/11/18          Exercise Comments: Exercise Comments    Row Name 06/20/18 1554 07/11/18 1520 07/18/18 1515 08/08/18 0915 08/15/18 1503   Exercise Comments  Patient able to tolerate low intensity exercise, increase workloads as tolerated.  Reviewed home exercise guidelines, METs, and goals with patient.  Reviewed METs and goals with patient.  MET level reviewed with patient on 08/05/18.  Reviewed METs and goals with patient.   [place] Name 09/02/18 1507           Exercise Comments   METs reviewed with patient.          Exercise Goals and Review: Exercise Goals  [place] Name 06/14/18 1539             Exercise Goals   Increase Physical Activity  Yes       Intervention  Provide advice, education, support and counseling about physical activity/exercise needs.;Develop an individualized exercise prescription for aerobic and resistive training based on initial evaluation findings, risk stratification, comorbidities and participant's personal goals.       Expected Outcomes  Short Term: Attend rehab on a regular basis to increase amount of physical activity.       Increase Strength and Stamina  Yes       Intervention  Provide advice, education, support and counseling about physical activity/exercise needs.;Develop an individualized exercise prescription for aerobic and resistive training based on initial evaluation findings, risk stratification, comorbidities and participant's personal goals.       Expected Outcomes  Short Term: Increase workloads from initial exercise prescription for resistance, speed, and METs.       Able to understand and use rate of perceived exertion (RPE) scale  Yes       Intervention  Provide education and explanation on how to use RPE scale       Expected Outcomes  Short Term: Able to use RPE daily in rehab to express subjective intensity level;Long Term:  Able to use RPE to guide intensity level when exercising independently       Knowledge and understanding of Target Heart Rate Range (THRR)  Yes       Intervention  Provide education and explanation of THRR including how the numbers were predicted and where they are located for reference       Expected Outcomes  Short Term: Able to state/look up THRR;Long Term: Able to use THRR to govern intensity when exercising independently;Short Term: Able to use daily as guideline for intensity in rehab       Able to check pulse independently  Yes       Intervention  Provide education and demonstration on how to  check pulse in carotid and radial arteries.;Review the importance of being able to check your own pulse for safety during independent exercise       Expected Outcomes  Short Term: Able to explain why pulse checking is important during independent exercise;Long Term: Able to check pulse independently and accurately       Understanding of Exercise Prescription  Yes       Intervention  Provide education, explanation, and written materials on patient's individual exercise prescription       Expected Outcomes  Short Term: Able to explain program exercise prescription;Long Term: Able to explain home exercise prescription to exercise independently          Exercise Goals Re-Evaluation : Exercise Goals Re-Evaluation    Row Name 06/20/18 1554 07/11/18 1520 07/18/18 1515 08/15/18 1503       Exercise Goal Re-Evaluation   Exercise Goals Review  Able to understand and use rate of perceived exertion (RPE) scale;Increase Physical Activity  Able to understand and use rate of perceived exertion (RPE) scale;Increase Physical Activity;Understanding of Exercise Prescription;Knowledge and understanding of Target Heart Rate Range (THRR)  Able to understand and use rate of perceived exertion (RPE) scale;Increase Physical Activity;Understanding of Exercise Prescription;Knowledge and understanding of Target Heart Rate Range (THRR)  Able to understand and use rate of perceived exertion (RPE) scale;Increase Physical Activity;Understanding of Exercise Prescription;Knowledge and understanding of Target Heart Rate Range (THRR)    Comments  Patient able to understand and use RPE scale appropriately.  Reviewed home exercise guidelines with patient including THRR, RPE scale, and endpoints for exercise. Pt states that she does some walking.  Patient states she is not walking much at home because of the weather. Encouraged patient to increase workloads at CR since she is unable to walk consistently at home, and pt is agreeable to this  suggestion.  Patient home exercise limited by weather. Pt is stretching at home but not walking much at this time.    Expected Outcomes  Increase workloads as tolerated to help improve cardiorespiratory fitness.  Increase walking duration at home to help increase strength and stamina.  Increase workloads as tolerated to help improve cardiorespiratory fitness.  Continue to progress workloads as tolerated.       Discharge Exercise Prescription (Final Exercise Prescription Changes): Exercise Prescription Changes - 09/02/18 1454      Response to Exercise   Blood Pressure (Admit)  122/80    Blood Pressure (Exercise)  124/80    Blood Pressure (Exit)  140/62    Heart Rate (Admit)  74 bpm    Heart Rate (Exercise)  94 bpm    Heart Rate (Exit)  68 bpm    Rating of Perceived Exertion (Exercise)  13    Symptoms  none    Duration  Progress to 30 minutes of  aerobic without signs/symptoms of physical distress    Intensity  THRR unchanged      Progression   Progression  Continue to progress workloads to maintain intensity without signs/symptoms of physical distress.    Average METs  2      Resistance Training   Training Prescription  Yes    Weight  3lbs    Reps  10-15    Time  10 Minutes      Interval Training   Interval Training  No      NuStep   Level  4    SPM  85    Minutes  22    METs  2      Track   Laps  5    Minutes  8    METs  2.09      Home Exercise Plan   Plans to continue exercise at  Home (comment)   Walking   Frequency  Add 1 additional day to program exercise sessions.    Initial Home Exercises Provided  07/11/18       Nutrition:  Target Goals: Understanding of nutrition guidelines, daily intake of sodium <1530m, cholesterol <2024m calories 30% from fat and 7% or less from saturated fats, daily to have 5 or more servings of fruits and vegetables.  Biometrics: Pre Biometrics - 06/14/18 1540      Pre Biometrics   Height  '4\' 11"'  (1.499 m)    Weight  79.8 kg     BMI (Calculated)  35.51        Nutrition Therapy Plan and Nutrition Goals: Nutrition Therapy & Goals - 06/14/18 1629      Nutrition Therapy   Diet  general healthful      Personal Nutrition Goals   Nutrition Goal  Pt to continue to identify and limit food sources of saturated fat, trans fat, refined carbohydrates and sodium    Personal Goal #2  Pt to watch out for sweets and added sugar.      Intervention Plan   Intervention  Prescribe, educate and counsel regarding individualized specific dietary modifications aiming towards targeted core components such as weight, hypertension, lipid management, diabetes, heart  failure and other comorbidities.    Expected Outcomes  Short Term Goal: A plan has been developed with personal nutrition goals set during dietitian appointment.       Nutrition Assessments: Nutrition Assessments - 06/14/18 1630      MEDFICTS Scores   Pre Score  28       Nutrition Goals Re-Evaluation: Nutrition Goals Re-Evaluation    Row Name 06/14/18 1629             Goals   Current Weight  173 lb 15.1 oz (78.9 kg)          Nutrition Goals Re-Evaluation: Nutrition Goals Re-Evaluation    Row Name 06/14/18 1629             Goals   Current Weight  173 lb 15.1 oz (78.9 kg)          Nutrition Goals Discharge (Final Nutrition Goals Re-Evaluation): Nutrition Goals Re-Evaluation - 06/14/18 1629      Goals   Current Weight  173 lb 15.1 oz (78.9 kg)       Psychosocial: Target Goals: Acknowledge presence or absence of significant depression and/or stress, maximize coping skills, provide positive support system. Participant is able to verbalize types and ability to use techniques and skills needed for reducing stress and depression.  Initial Review & Psychosocial Screening: Initial Psych Review & Screening - 07/14/18 1555      Initial Review   Current issues with  Current Stress Concerns    Source of Stress Concerns  Chronic Illness       Family Dynamics   Good Support System?  Yes   Kristine Oliver has her son, daughter in law and nephew for support     Barriers   Psychosocial barriers to participate in program  There are no identifiable barriers or psychosocial needs.      Screening Interventions   Interventions  Encouraged to exercise    Expected Outcomes  Short Term goal: Utilizing psychosocial counselor, staff and physician to assist with identification of specific Stressors or current issues interfering with healing process. Setting desired goal for each stressor or current issue identified.;Long Term Goal: Stressors or current issues are controlled or eliminated.;Short Term goal: Identification and review with participant of any Quality of Life or Depression concerns found by scoring the questionnaire.       Quality of Life Scores: Quality of Life - 06/14/18 1438      Quality of Life   Select  Quality of Life      Quality of Life Scores   Health/Function Pre  15.7 %    Socioeconomic Pre  22.8 %    Psych/Spiritual Pre  24.4 %    Family Pre  24.6 %    GLOBAL Pre  20.36 %      Scores of 19 and below usually indicate a poorer quality of life in these areas.  A difference of  2-3 points is a clinically meaningful difference.  A difference of 2-3 points in the total score of the Quality of Life Index has been associated with significant improvement in overall quality of life, self-image, physical symptoms, and general health in studies assessing change in quality of life.  PHQ-9: Recent Review Flowsheet Data    Depression screen [facility] 2/9 06/20/2018   Decreased Interest 0   Down, Depressed, Hopeless 0   PHQ - 2 Score 0     Interpretation of Total Score  Total Score Depression Severity:  1-4 = Minimal depression, 5-9 =  Mild depression, 10-14 = Moderate depression, 15-19 = Moderately severe depression, 20-27 = Severe depression   Psychosocial Evaluation and Intervention:   Psychosocial Re-Evaluation: Psychosocial  Re-Evaluation    [place] Name 07/14/18 1555 07/14/18 1558 08/11/18 1505 09/08/18 1527       Psychosocial Re-Evaluation   Current issues with  Current Stress Concerns  Current Stress Concerns  Current Stress Concerns  Current Stress Concerns    Comments  -  -  Kristine Oliver has not voiced any   Kristine Oliver has not voiced any increased concerns or stressors    Interventions  -  Stress management education;Encouraged to attend Cardiac Rehabilitation for the exercise  Stress management education;Encouraged to attend Cardiac Rehabilitation for the exercise  Stress management education;Encouraged to attend Cardiac Rehabilitation for the exercise    Continue Psychosocial Services   -  -  -  No Follow up required      Initial Review   Source of Stress Concerns  -  Chronic Illness  Chronic Illness  Chronic Illness       Psychosocial Discharge (Final Psychosocial Re-Evaluation): Psychosocial Re-Evaluation - 09/08/18 1527      Psychosocial Re-Evaluation   Current issues with  Current Stress Concerns    Comments  Kristine Oliver has not voiced any increased concerns or stressors    Interventions  Stress management education;Encouraged to attend Cardiac Rehabilitation for the exercise    Continue Psychosocial Services   No Follow up required      Initial Review   Source of Stress Concerns  Chronic Illness       Vocational Rehabilitation: Provide vocational rehab assistance to qualifying candidates.   Vocational Rehab Evaluation & Intervention:   Education: Education Goals: Education classes will be provided on a weekly basis, covering required topics. Participant will state understanding/return demonstration of topics presented.  Learning Barriers/Preferences: Learning Barriers/Preferences - 06/14/18 1542      Learning Barriers/Preferences   Learning Barriers  Sight    Learning Preferences  Audio;Group Instruction;Individual Instruction;Pictoral;Skilled Demonstration;Verbal Instruction;Video;Written Material        Education Topics: Count Your Pulse:  -Group instruction provided by verbal instruction, demonstration, patient participation and written materials to support subject.  Instructors address importance of being able to find your pulse and how to count your pulse when at home without a heart monitor.  Patients get hands on experience counting their pulse with staff help and individually.   Heart Attack, Angina, and Risk Factor Modification:  -Group instruction provided by verbal instruction, video, and written materials to support subject.  Instructors address signs and symptoms of angina and heart attacks.    Also discuss risk factors for heart disease and how to make changes to improve heart health risk factors.   Functional Fitness:  -Group instruction provided by verbal instruction, demonstration, patient participation, and written materials to support subject.  Instructors address safety measures for doing things around the house.  Discuss how to get up and down off the floor, how to pick things up properly, how to safely get out of a chair without assistance, and balance training.   CARDIAC REHAB PHASE II EXERCISE from 08/19/2018 in [place]  Date  08/19/18  Educator  EP  Instruction Review Code  2- Demonstrated Understanding      Meditation and Mindfulness:  -Group instruction provided by verbal instruction, patient participation, and written materials to support subject.  Instructor addresses importance of mindfulness and meditation practice to help reduce stress and improve awareness.  Instructor  also leads participants through a meditation exercise.    Stretching for Flexibility and Mobility:  -Group instruction provided by verbal instruction, patient participation, and written materials to support subject.  Instructors lead participants through series of stretches that are designed to increase flexibility thus improving mobility.  These  stretches are additional exercise for major muscle groups that are typically performed during regular warm up and cool down.   Hands Only CPR:  -Group verbal, video, and participation provides a basic overview of AHA guidelines for community CPR. Role-play of emergencies allow participants the opportunity to practice calling for help and chest compression technique with discussion of AED use.   Hypertension: -Group verbal and written instruction that provides a basic overview of hypertension including the most recent diagnostic guidelines, risk factor reduction with self-care instructions and medication management.   CARDIAC REHAB PHASE II EXERCISE from 08/19/2018 in [place]  Date  08/05/18  Instruction Review Code  2- Demonstrated Understanding       Nutrition I class: Heart Healthy Eating:  -Group instruction provided by PowerPoint slides, verbal discussion, and written materials to support subject matter. The instructor gives an explanation and review of the Therapeutic Lifestyle Changes diet recommendations, which includes a discussion on lipid goals, dietary fat, sodium, fiber, plant stanol/sterol esters, sugar, and the components of a well-balanced, healthy diet.   Nutrition II class: Lifestyle Skills:  -Group instruction provided by PowerPoint slides, verbal discussion, and written materials to support subject matter. The instructor gives an explanation and review of label reading, grocery shopping for heart health, heart healthy recipe modifications, and ways to make healthier choices when eating out.   Diabetes Question & Answer:  -Group instruction provided by PowerPoint slides, verbal discussion, and written materials to support subject matter. The instructor gives an explanation and review of diabetes co-morbidities, pre- and post-prandial blood glucose goals, pre-exercise blood glucose goals, signs, symptoms, and treatment of hypoglycemia and  hyperglycemia, and foot care basics.   Diabetes Blitz:  -Group instruction provided by PowerPoint slides, verbal discussion, and written materials to support subject matter. The instructor gives an explanation and review of the physiology behind type 1 and type 2 diabetes, diabetes medications and rational behind using different medications, pre- and post-prandial blood glucose recommendations and Hemoglobin A1c goals, diabetes diet, and exercise including blood glucose guidelines for exercising safely.    Portion Distortion:  -Group instruction provided by PowerPoint slides, verbal discussion, written materials, and food models to support subject matter. The instructor gives an explanation of serving size versus portion size, changes in portions sizes over the last 20 years, and what consists of a serving from each food group.   Stress Management:  -Group instruction provided by verbal instruction, video, and written materials to support subject matter.  Instructors review role of stress in heart disease and how to cope with stress positively.     Exercising on Your Own:  -Group instruction provided by verbal instruction, power point, and written materials to support subject.  Instructors discuss benefits of exercise, components of exercise, frequency and intensity of exercise, and end points for exercise.  Also discuss use of nitroglycerin and activating EMS.  Review options of places to exercise outside of rehab.  Review guidelines for sex with heart disease.   CARDIAC REHAB PHASE II EXERCISE from 08/19/2018 in [place]  Date  07/20/18  Educator  EP  Instruction Review Code  2- Demonstrated Understanding  Cardiac Drugs I:  -Group instruction provided by verbal instruction and written materials to support subject.  Instructor reviews cardiac drug classes: antiplatelets, anticoagulants, beta blockers, and statins.  Instructor discusses reasons, side  effects, and lifestyle considerations for each drug class.   CARDIAC REHAB PHASE II EXERCISE from 08/19/2018 in [place]  Date  08/10/18  Educator  Pharmacist  Instruction Review Code  2- Demonstrated Understanding      Cardiac Drugs II:  -Group instruction provided by verbal instruction and written materials to support subject.  Instructor reviews cardiac drug classes: angiotensin converting enzyme inhibitors (ACE-I), angiotensin II receptor blockers (ARBs), nitrates, and calcium channel blockers.  Instructor discusses reasons, side effects, and lifestyle considerations for each drug class.   Anatomy and Physiology of the Circulatory System:  Group verbal and written instruction and models provide basic cardiac anatomy and physiology, with the coronary electrical and arterial systems. Review of: AMI, Angina, Valve disease, Heart Failure, Peripheral Artery Disease, Cardiac Arrhythmia, Pacemakers, and the ICD.   CARDIAC REHAB PHASE II EXERCISE from 08/19/2018 in [place]  Date  06/29/18  Educator  RN  Instruction Review Code  2- Demonstrated Understanding      Other Education:  -Group or individual verbal, written, or video instructions that support the educational goals of the cardiac rehab program.   Holiday Eating Survival Tips:  -Group instruction provided by PowerPoint slides, verbal discussion, and written materials to support subject matter. The instructor gives patients tips, tricks, and techniques to help them not only survive but enjoy the holidays despite the onslaught of food that accompanies the holidays.   Knowledge Questionnaire Score: Knowledge Questionnaire Score - 06/14/18 1438      Knowledge Questionnaire Score   Pre Score  21/24       Core Components/Risk Factors/Patient Goals at Admission: Personal Goals and Risk Factors at Admission - 06/14/18 1542      Core Components/Risk Factors/Patient  Goals on Admission    Weight Management  Yes;Obesity;Weight Maintenance;Weight Loss    Intervention  Weight Management: Develop a combined nutrition and exercise program designed to reach desired caloric intake, while maintaining appropriate intake of nutrient and fiber, sodium and fats, and appropriate energy expenditure required for the weight goal.;Weight Management: Provide education and appropriate resources to help participant work on and attain dietary goals.;Weight Management/Obesity: Establish reasonable short term and long term weight goals.;Obesity: Provide education and appropriate resources to help participant work on and attain dietary goals.    Admit Weight  175 lb 14.8 oz (79.8 kg)    Expected Outcomes  Short Term: Continue to assess and modify interventions until short term weight is achieved;Long Term: Adherence to nutrition and physical activity/exercise program aimed toward attainment of established weight goal;Weight Maintenance: Understanding of the daily nutrition guidelines, which includes 25-35% calories from fat, 7% or less cal from saturated fats, less than 231m cholesterol, less than 1.5gm of sodium, & 5 or more servings of fruits and vegetables daily;Weight Loss: Understanding of general recommendations for a balanced deficit meal plan, which promotes 1-2 lb weight loss per week and includes a negative energy balance of [id number] kcal/d;Understanding recommendations for meals to include 15-35% energy as protein, 25-35% energy from fat, 35-60% energy from carbohydrates, less than 2063mof dietary cholesterol, 20-35 gm of total fiber daily;Understanding of distribution of calorie intake throughout the day with the consumption of 4-5 meals/snacks    Diabetes  Yes    Intervention  Provide education  about signs/symptoms and action to take for hypo/hyperglycemia.;Provide education about proper nutrition, including hydration, and aerobic/resistive exercise prescription along with  prescribed medications to achieve blood glucose in normal ranges: Fasting glucose 65-99 mg/dL    Expected Outcomes  Short Term: Participant verbalizes understanding of the signs/symptoms and immediate care of hyper/hypoglycemia, proper foot care and importance of medication, aerobic/resistive exercise and nutrition plan for blood glucose control.;Long Term: Attainment of HbA1C < 7%.    Hypertension  Yes    Intervention  Provide education on lifestyle modifcations including regular physical activity/exercise, weight management, moderate sodium restriction and increased consumption of fresh fruit, vegetables, and low fat dairy, alcohol moderation, and smoking cessation.;Monitor prescription use compliance.    Expected Outcomes  Short Term: Continued assessment and intervention until BP is < 140/16m HG in hypertensive participants. < 130/893mHG in hypertensive participants with diabetes, heart failure or chronic kidney disease.;Long Term: Maintenance of blood pressure at goal levels.    Lipids  Yes    Intervention  Provide education and support for participant on nutrition & aerobic/resistive exercise along with prescribed medications to achieve LDL <7068mHDL >23m66m  Expected Outcomes  Short Term: Participant states understanding of desired cholesterol values and is compliant with medications prescribed. Participant is following exercise prescription and nutrition guidelines.;Long Term: Cholesterol controlled with medications as prescribed, with individualized exercise RX and with personalized nutrition plan. Value goals: LDL < 70mg54mL > 40 mg.    Stress  Yes    Intervention  Offer individual and/or small group education and counseling on adjustment to heart disease, stress management and health-related lifestyle change. Teach and support self-help strategies.;Refer participants experiencing significant psychosocial distress to appropriate mental health specialists for further evaluation and treatment.  When possible, include family members and significant others in education/counseling sessions.    Expected Outcomes  Short Term: Participant demonstrates changes in health-related behavior, relaxation and other stress management skills, ability to obtain effective social support, and compliance with psychotropic medications if prescribed.;Long Term: Emotional wellbeing is indicated by absence of clinically significant psychosocial distress or social isolation.       Core Components/Risk Factors/Patient Goals Review:  Goals and Risk Factor Review    Row Name 06/23/18 1342 07/14/18 1559 08/11/18 1508 09/08/18 1528       Core Components/Risk Factors/Patient Goals Review   Personal Goals Review  Weight Management/Obesity;Lipids;Diabetes;Hypertension  Weight Management/Obesity;Lipids;Diabetes;Hypertension;Stress  Weight Management/Obesity;Lipids;Diabetes;Hypertension;Stress  Weight Management/Obesity;Lipids;Diabetes;Hypertension;Stress    Review  Kristine Oliver exercise at cardiac rehab this week. Barabara's vital signs and Oliver have been stable so far. BarbaDevona not check her Oliver at home.  Ryka's vital signs have been stable at phase 2 cardiac rehab. Kristine Oliver Oliver have beens table at cardiac rehab. We continue to check Jakaya's Oliver at cardiac rehab as she does not check them at home  Kristine Oliver vital signs have been stable at phase 2 cardiac rehab. Kristine Oliver have beens table at cardiac rehab. We continue to check Kristine Oliver Oliver at cardiac rehab as she does not check them at home  Dazja's vital signs have been stable at phase 2 cardiac rehab. Kristine Oliver Oliver have beens table at cardiac rehab. We continue to check Kristine Oliver at cardiac rehab as she does not check them at home    Expected Outcomes  Patient will participate in cardiac rehab for exercise, diet and lifestyle modifcations. Will encourage Kristine Oliver how to check her own Oliver so she can check her Oliver at home.   Patient will participate  in cardiac rehab for exercise, diet and lifestyle modifcations. Will encourage Kristine Oliver to lear how to check her own Oliver so she can check her Oliver at home.  Patient will participate in cardiac rehab for exercise, diet and lifestyle modifcations. Kristine Oliver has no desire to check her own Oliver and does nto check them at home.  Patient will participate in cardiac rehab for exercise, diet and lifestyle modifcations. Kristine Oliver has no desire to check her own Oliver and does nto check them at home.       Core Components/Risk Factors/Patient Goals at Discharge (Final Review):  Goals and Risk Factor Review - 09/08/18 1528      Core Components/Risk Factors/Patient Goals Review   Personal Goals Review  Weight Management/Obesity;Lipids;Diabetes;Hypertension;Stress    Review  Kristine Oliver vital signs have been stable at phase 2 cardiac rehab. Kristine Oliver Oliver have beens table at cardiac rehab. We continue to check Alexsa's Oliver at cardiac rehab as she does not check them at home    Expected Outcomes  Patient will participate in cardiac rehab for exercise, diet and lifestyle modifcations. Ludy has no desire to check her own Oliver and does nto check them at home.       ITP Comments: ITP Comments    Row Name 06/14/18 1435 06/22/18 1446 07/14/18 1554 08/11/18 1505 09/08/18 1527   ITP Comments  Dr.Traci Radford Pax, Medical Director   30 day ITP Review. Jasmane started exercise at cardiac rehab on Monday 06/20/18  30 day ITP Review. Vernona has good attendance and participation in phase 2 cardiac rehab.  30 day ITP Review. Kamarah has good attendance and participation in phase 2 cardiac rehab.  30 day ITP Review. Stephan has good attendance and participation in phase 2 cardiac rehab.      Comments: See ITP comments. Ashleyanne will complete cardiac rehab on next week.Barnet Pall, RN,BSN 09/08/2018 3:31 PM

## 2018-09-09 ENCOUNTER — Ambulatory Visit (HOSPITAL_COMMUNITY): Payer: Medicare Other

## 2018-09-09 ENCOUNTER — Encounter (HOSPITAL_COMMUNITY)
Admission: RE | Admit: 2018-09-09 | Discharge: 2018-09-09 | Disposition: A | Discharge status: Home / Self Care | Payer: Medicare Other | Source: Ambulatory Visit | Attending: Cardiology | Admitting: Cardiology

## 2018-09-09 DIAGNOSIS — Z955 Presence of coronary angioplasty implant and graft: Secondary | ICD-10-CM

## 2018-09-09 DIAGNOSIS — I214 Non-ST elevation (NSTEMI) myocardial infarction: Secondary | ICD-10-CM

## 2018-09-09 LAB — GLUCOSE, CAPILLARY: GLUCOSE-CAPILLARY: 148 mg/dL — AB (ref 70–99)

## 2018-09-12 ENCOUNTER — Ambulatory Visit (HOSPITAL_COMMUNITY): Payer: Medicare Other

## 2018-09-12 ENCOUNTER — Encounter (HOSPITAL_COMMUNITY): Payer: Medicare Other

## 2018-09-14 ENCOUNTER — Telehealth (HOSPITAL_COMMUNITY): Payer: Self-pay | Admitting: *Deleted

## 2018-09-14 ENCOUNTER — Encounter (HOSPITAL_COMMUNITY): Payer: Medicare Other

## 2018-09-14 ENCOUNTER — Ambulatory Visit (HOSPITAL_COMMUNITY): Payer: Medicare Other

## 2018-09-16 ENCOUNTER — Encounter (HOSPITAL_COMMUNITY)
Admission: RE | Admit: 2018-09-16 | Discharge: 2018-09-16 | Disposition: A | Discharge status: Home / Self Care | Payer: Medicare Other | Source: Ambulatory Visit | Attending: Cardiology | Admitting: Cardiology

## 2018-09-16 ENCOUNTER — Ambulatory Visit (HOSPITAL_COMMUNITY): Payer: Medicare Other

## 2018-09-16 DIAGNOSIS — I214 Non-ST elevation (NSTEMI) myocardial infarction: Secondary | ICD-10-CM | POA: Diagnosis not present

## 2018-09-16 DIAGNOSIS — Z955 Presence of coronary angioplasty implant and graft: Secondary | ICD-10-CM | POA: Diagnosis not present

## 2018-09-16 LAB — GLUCOSE, CAPILLARY: Glucose-Capillary: 116 mg/dL — ABNORMAL HIGH (ref 70–99)

## 2018-09-16 NOTE — Progress Notes (Signed)
Kristine Oliver reported that she had angina on Monday after washing her hair. Kristine Oliver says that she had chest pain and pressure that started in her left arm and radiated to her left chest. Kristine Oliver said she took 3 sublingual nitroglycerin over 30 minutes with relief. Kristine Oliver denies having chest pain today. This has been Kristine Oliver's first return to exercise this week. Kristine Oliver did not exercise today. Upon inspection of today's telemetry rhythm unable to visualize a P wave. Kristine Oliver PAC called. 12 lead ECG ordered. 12 lead ECG completed. Kristine reviewed the 12 lead ECG and said that Kristine Oliver can return to cardiac rehab on her next scheduled visit and to keep her appointment with Dr Marlou Porch next Wednesday. Kristine Oliver left cardiac rehab without complaints. Blood pressure 108/78.  Heart rate 84. Oxygen saturation 98% on room air. CBG 116. Kristine Oliver says she is taking her medications as prescribed.Will continue to monitor the patient throughout  the program.

## 2018-09-19 ENCOUNTER — Ambulatory Visit (HOSPITAL_COMMUNITY): Payer: Medicare Other

## 2018-09-19 ENCOUNTER — Encounter (HOSPITAL_COMMUNITY)
Admission: RE | Admit: 2018-09-19 | Discharge: 2018-09-19 | Disposition: A | Discharge status: Home / Self Care | Payer: Medicare Other | Source: Ambulatory Visit | Attending: Cardiology | Admitting: Cardiology

## 2018-09-19 VITALS — BP 110/64 | HR 96 | Ht 59.0 in | Wt 172.0 lb

## 2018-09-19 DIAGNOSIS — I214 Non-ST elevation (NSTEMI) myocardial infarction: Secondary | ICD-10-CM | POA: Diagnosis not present

## 2018-09-19 DIAGNOSIS — Z955 Presence of coronary angioplasty implant and graft: Secondary | ICD-10-CM

## 2018-09-19 LAB — GLUCOSE, CAPILLARY: Glucose-Capillary: 126 mg/dL — ABNORMAL HIGH (ref 70–99)

## 2018-09-19 NOTE — Addendum Note (Signed)
Encounter addended by: Sol Passer on: 09/19/2018 4:55 PM  Actions taken: Visit Navigator Flowsheet section accepted

## 2018-09-19 NOTE — Progress Notes (Signed)
Discharge Progress Report  Patient Details  Name: Kristine Oliver MRN: [id number] Date of Birth: 1931/06/07 Referring Provider:     [place] from 06/14/2018 in [facility]  Referring Provider  Dr. Marlou Porch       Number of Visits: 24  Reason for Discharge:  Patient reached a stable level of exercise.  Smoking History:  Social History   Tobacco Use  Smoking Status Former Smoker  . Years: 35.00  Smokeless Tobacco Never Used    Diagnosis:  08/16/2019NSTEMI (non-ST elevated myocardial infarction) ([place])  08/16/19Stented coronary artery  ADL [facility]:   Initial Exercise Prescription: Initial Exercise Prescription - 06/14/18 1600      Date of Initial Exercise RX and Referring Provider   Date  06/14/18    Referring Provider  Dr. Marlou Porch    Expected Discharge Date  09/23/18      NuStep   Level  2    SPM  75    Minutes  10    METs  1      Arm Ergometer   Level  1    Minutes  10    METs  1.3      Track   Laps  6    Minutes  10    METs  2      Prescription Details   Frequency (times per week)  3    Duration  Progress to 30 minutes of continuous aerobic without signs/symptoms of physical distress      Intensity   THRR 40-80% of Max Heartrate  54-108    Ratings of Perceived Exertion  11-13      Progression   Progression  Continue to progress workloads to maintain intensity without signs/symptoms of physical distress.      Resistance Training   Training Prescription  Yes    Weight  2 lbs.    Reps  10-15       Discharge Exercise Prescription (Final Exercise Prescription Changes): Exercise Prescription Changes - 09/19/18 1456      Response to Exercise   Blood Pressure (Admit)  110/64    Blood Pressure (Exercise)  158/70    Blood Pressure (Exit)  108/78    Heart Rate (Admit)  96 bpm    Heart Rate (Exercise)  127 bpm    Heart Rate (Exit)  95 bpm    Rating of Perceived Exertion (Exercise)  15    Symptoms   none    Duration  Progress to 30 minutes of  aerobic without signs/symptoms of physical distress    Intensity  THRR unchanged      Progression   Progression  Continue to progress workloads to maintain intensity without signs/symptoms of physical distress.    Average METs  2.6      Resistance Training   Training Prescription  Yes    Weight  3lbs    Reps  10-15    Time  10 Minutes      Interval Training   Interval Training  No      Track   Laps  5.5   6 minute walk test: 1132f   Minutes  6    METs  2.6      Home Exercise Plan   Plans to continue exercise at  Home (comment)   Walking   Frequency  Add 1 additional day to program exercise sessions.    Initial Home Exercises Provided  07/11/18  Functional Capacity: 6 Minute Walk    Row Name 06/14/18 1537 09/19/18 1514       6 Minute Walk   Phase  Initial  Discharge    Distance  800 feet  1103 feet    Distance % Change  -  37.88 %    Walk Time  6 minutes  6 minutes    # of Rest Breaks  2  0    MPH  1.51  2.08    METS  0.42  1.77    RPE  13  15    Perceived Dyspnea   -  0    VO2 Peak  1.48  6.18    Symptoms  Yes (comment)  No    Comments  Fatigue  -    Resting HR  64 bpm  96 bpm    Resting BP  114/70  110/64    Resting Oxygen Saturation   96 %  -    Max Ex. HR  81 bpm  127 bpm    Max Ex. BP  118/70  158/70    2 Minute Post BP  122/68  108/78       Psychological, QOL, Others - Outcomes: PHQ 2/9: Depression screen [facility] 2/9 09/19/2018 06/20/2018  Decreased Interest 0 0  Down, Depressed, Hopeless 0 0  PHQ - 2 Score 0 0    Quality of Life: Quality of Life - 09/19/18 1650      Quality of Life   Select  Quality of Life      Quality of Life Scores   Health/Function Pre  15.7 %    Health/Function Post  13.83 %    Health/Function % Change  -11.91 %    Socioeconomic Pre  22.8 %    Socioeconomic Post  24.14 %    Socioeconomic % Change   5.88 %    Psych/Spiritual Pre  24.4 %    Psych/Spiritual Post   22.5 %    Psych/Spiritual % Change  -7.79 %    Family Pre  24.6 %    Family Post  23.4 %    Family % Change  -4.88 %    GLOBAL Pre  20.36 %    GLOBAL Post  19.15 %    GLOBAL % Change  -5.94 %       Personal Goals: Goals established at orientation with interventions provided to work toward goal. Personal Goals and Risk Factors at Admission - 06/14/18 1542      Core Components/Risk Factors/Patient Goals on Admission    Weight Management  Yes;Obesity;Weight Maintenance;Weight Loss    Intervention  Weight Management: Develop a combined nutrition and exercise program designed to reach desired caloric intake, while maintaining appropriate intake of nutrient and fiber, sodium and fats, and appropriate energy expenditure required for the weight goal.;Weight Management: Provide education and appropriate resources to help participant work on and attain dietary goals.;Weight Management/Obesity: Establish reasonable short term and long term weight goals.;Obesity: Provide education and appropriate resources to help participant work on and attain dietary goals.    Admit Weight  175 lb 14.8 oz (79.8 kg)    Expected Outcomes  Short Term: Continue to assess and modify interventions until short term weight is achieved;Long Term: Adherence to nutrition and physical activity/exercise program aimed toward attainment of established weight goal;Weight Maintenance: Understanding of the daily nutrition guidelines, which includes 25-35% calories from fat, 7% or less cal from saturated fats, less than 237m cholesterol, less than 1.5gm of  sodium, & 5 or more servings of fruits and vegetables daily;Weight Loss: Understanding of general recommendations for a balanced deficit meal plan, which promotes 1-2 lb weight loss per week and includes a negative energy balance of [phone] kcal/d;Understanding recommendations for meals to include 15-35% energy as protein, 25-35% energy from fat, 35-60% energy from carbohydrates, less  than 223m of dietary cholesterol, 20-35 gm of total fiber daily;Understanding of distribution of calorie intake throughout the day with the consumption of 4-5 meals/snacks    Diabetes  Yes    Intervention  Provide education about signs/symptoms and action to take for hypo/hyperglycemia.;Provide education about proper nutrition, including hydration, and aerobic/resistive exercise prescription along with prescribed medications to achieve blood glucose in normal ranges: Fasting glucose 65-99 mg/dL    Expected Outcomes  Short Term: Participant verbalizes understanding of the signs/symptoms and immediate care of hyper/hypoglycemia, proper foot care and importance of medication, aerobic/resistive exercise and nutrition plan for blood glucose control.;Long Term: Attainment of HbA1C < 7%.    Hypertension  Yes    Intervention  Provide education on lifestyle modifcations including regular physical activity/exercise, weight management, moderate sodium restriction and increased consumption of fresh fruit, vegetables, and low fat dairy, alcohol moderation, and smoking cessation.;Monitor prescription use compliance.    Expected Outcomes  Short Term: Continued assessment and intervention until BP is < 140/926mHG in hypertensive participants. < 130/8010mG in hypertensive participants with diabetes, heart failure or chronic kidney disease.;Long Term: Maintenance of blood pressure at goal levels.    Lipids  Yes    Intervention  Provide education and support for participant on nutrition & aerobic/resistive exercise along with prescribed medications to achieve LDL <10m25mDL >40mg73m Expected Outcomes  Short Term: Participant states understanding of desired cholesterol values and is compliant with medications prescribed. Participant is following exercise prescription and nutrition guidelines.;Long Term: Cholesterol controlled with medications as prescribed, with individualized exercise RX and with personalized nutrition  plan. Value goals: LDL < 10mg,86m > 40 mg.    Stress  Yes    Intervention  Offer individual and/or small group education and counseling on adjustment to heart disease, stress management and health-related lifestyle change. Teach and support self-help strategies.;Refer participants experiencing significant psychosocial distress to appropriate mental health specialists for further evaluation and treatment. When possible, include family members and significant others in education/counseling sessions.    Expected Outcomes  Short Term: Participant demonstrates changes in health-related behavior, relaxation and other stress management skills, ability to obtain effective social support, and compliance with psychotropic medications if prescribed.;Long Term: Emotional wellbeing is indicated by absence of clinically significant psychosocial distress or social isolation.        Personal Goals Discharge: Goals and Risk Factor Review    Row Name 06/23/18 1342 07/14/18 1559 08/11/18 1508 09/08/18 1528 09/19/18 1632     Core Components/Risk Factors/Patient Goals Review   Personal Goals Review  Weight Management/Obesity;Lipids;Diabetes;Hypertension  Weight Management/Obesity;Lipids;Diabetes;Hypertension;Stress  Weight Management/Obesity;Lipids;Diabetes;Hypertension;Stress  Weight Management/Obesity;Lipids;Diabetes;Hypertension;Stress  Weight Management/Obesity;Lipids;Diabetes;Hypertension;Stress   Review  BarabaMarland Mcalpineed exercise at cardiac rehab this week. Barabara's vital signs and CBG's have been stable so far. BarbarGaigenot check her cBG's at home.  Rieley's vital signs have been stable at phase 2 cardiac rehab. Cardelia's CBG's have beens table at cardiac rehab. We continue to check Kristyl's CBG's at cardiac rehab as she does not check them at home  Favour's vital signs have been stable at phase 2 cardiac rehab. Jami's CBG's have beens table at cardiac rehab. We  continue to check Taela's CBG's at  cardiac rehab as she does not check them at home  Jessamine's vital signs have been stable at phase 2 cardiac rehab. Issa's CBG's have beens table at cardiac rehab. We continue to check Sharilynn's CBG's at cardiac rehab as she does not check them at home  Kaylenn's vital signs have been stable at phase 2 cardiac rehab. Maddyn's CBG's have beens table at cardiac rehab. We continue to check Jodette's CBG's at cardiac rehab as she does not check them at home   Expected Outcomes  Patient will participate in cardiac rehab for exercise, diet and lifestyle modifcations. Will encourage Deane to lear how to check her own CBG's so she can check her CBg's at home.  Patient will participate in cardiac rehab for exercise, diet and lifestyle modifcations. Will encourage Vassie to lear how to check her own CBG's so she can check her CBg's at home.  Patient will participate in cardiac rehab for exercise, diet and lifestyle modifcations. Kaelen has no desire to check her own CBG's and does nto check them at home.  Patient will participate in cardiac rehab for exercise, diet and lifestyle modifcations. Eudelia has no desire to check her own CBG's and does nto check them at home.  Pamala Hurry graduates from cardiac rehab today. Patient has been encouarged to continue exercise on her own,      Exercise Goals and Review: Exercise Goals    Row Name 06/14/18 1539             Exercise Goals   Increase Physical Activity  Yes       Intervention  Provide advice, education, support and counseling about physical activity/exercise needs.;Develop an individualized exercise prescription for aerobic and resistive training based on initial evaluation findings, risk stratification, comorbidities and participant's personal goals.       Expected Outcomes  Short Term: Attend rehab on a regular basis to increase amount of physical activity.       Increase Strength and Stamina  Yes       Intervention  Provide advice, education, support  and counseling about physical activity/exercise needs.;Develop an individualized exercise prescription for aerobic and resistive training based on initial evaluation findings, risk stratification, comorbidities and participant's personal goals.       Expected Outcomes  Short Term: Increase workloads from initial exercise prescription for resistance, speed, and METs.       Able to understand and use rate of perceived exertion (RPE) scale  Yes       Intervention  Provide education and explanation on how to use RPE scale       Expected Outcomes  Short Term: Able to use RPE daily in rehab to express subjective intensity level;Long Term:  Able to use RPE to guide intensity level when exercising independently       Knowledge and understanding of Target Heart Rate Range (THRR)  Yes       Intervention  Provide education and explanation of THRR including how the numbers were predicted and where they are located for reference       Expected Outcomes  Short Term: Able to state/look up THRR;Long Term: Able to use THRR to govern intensity when exercising independently;Short Term: Able to use daily as guideline for intensity in rehab       Able to check pulse independently  Yes       Intervention  Provide education and demonstration on how to check pulse in carotid and radial arteries.;Review the  importance of being able to check your own pulse for safety during independent exercise       Expected Outcomes  Short Term: Able to explain why pulse checking is important during independent exercise;Long Term: Able to check pulse independently and accurately       Understanding of Exercise Prescription  Yes       Intervention  Provide education, explanation, and written materials on patient's individual exercise prescription       Expected Outcomes  Short Term: Able to explain program exercise prescription;Long Term: Able to explain home exercise prescription to exercise independently          Exercise Goals  Re-Evaluation: Exercise Goals Re-Evaluation    Row Name 06/20/18 1554 07/11/18 1520 07/18/18 1515 08/15/18 1503 09/19/18 1547     Exercise Goal Re-Evaluation   Exercise Goals Review  Able to understand and use rate of perceived exertion (RPE) scale;Increase Physical Activity  Able to understand and use rate of perceived exertion (RPE) scale;Increase Physical Activity;Understanding of Exercise Prescription;Knowledge and understanding of Target Heart Rate Range (THRR)  Able to understand and use rate of perceived exertion (RPE) scale;Increase Physical Activity;Understanding of Exercise Prescription;Knowledge and understanding of Target Heart Rate Range (THRR)  Able to understand and use rate of perceived exertion (RPE) scale;Increase Physical Activity;Understanding of Exercise Prescription;Knowledge and understanding of Target Heart Rate Range (THRR)  Able to understand and use rate of perceived exertion (RPE) scale;Increase Physical Activity;Understanding of Exercise Prescription;Knowledge and understanding of Target Heart Rate Range (THRR);Increase Strength and Stamina   Comments  Patient able to understand and use RPE scale appropriately.  Reviewed home exercise guidelines with patient including THRR, RPE scale, and endpoints for exercise. Pt states that she does some walking.  Patient states she is not walking much at home because of the weather. Encouraged patient to increase workloads at CR since she is unable to walk consistently at home, and pt is agreeable to this suggestion.  Patient home exercise limited by weather. Pt is stretching at home but not walking much at this time.  Patient's functional capacity increased 38% as measured by 6MWT, and strength increased 23% as measured by grip strength test. Flexibility increased 100% as measured by sit and reach test. Pt plans to walk 30 minutes, at least 3 days/week at home. Pt is also considering participation in the maintenance program.   Expected  Outcomes  Increase workloads as tolerated to help improve cardiorespiratory fitness.  Increase walking duration at home to help increase strength and stamina.  Increase workloads as tolerated to help improve cardiorespiratory fitness.  Continue to progress workloads as tolerated.  Patient will exercise 30 minutes, 3 days/week to help maintain health and fitness gains.      Nutrition & Weight - Outcomes: Pre Biometrics - 06/14/18 1540      Pre Biometrics   Height  '4\' 11"'  (1.499 m)    Weight  175 lb 14.8 oz (79.8 kg)    Waist Circumference  47 inches    Hip Circumference  49.5 inches    Waist to Hip Ratio  0.95 %    BMI (Calculated)  35.51    Triceps Skinfold  18 mm    % Body Fat  47.6 %    Grip Strength  26 kg    Flexibility  0 in    Single Leg Stand  0 seconds      Post Biometrics - 09/19/18 1547       Post  Biometrics   Height  '4\' 11"'  (1.499 m)    Weight  171 lb 15.3 oz (78 kg)    Waist Circumference  45 inches    Hip Circumference  50 inches    Waist to Hip Ratio  0.9 %    BMI (Calculated)  34.71    Triceps Skinfold  24 mm    % Body Fat  47.9 %    Grip Strength  32 kg    Flexibility  9 in    Single Leg Stand  0 seconds       Nutrition: Nutrition Therapy & Goals - 06/14/18 1629      Nutrition Therapy   Diet  general healthful      Personal Nutrition Goals   Nutrition Goal  Pt to continue to identify and limit food sources of saturated fat, trans fat, refined carbohydrates and sodium    Personal Goal #2  Pt to watch out for sweets and added sugar.      Intervention Plan   Intervention  Prescribe, educate and counsel regarding individualized specific dietary modifications aiming towards targeted core components such as weight, hypertension, lipid management, diabetes, heart failure and other comorbidities.    Expected Outcomes  Short Term Goal: A plan has been developed with personal nutrition goals set during dietitian appointment.       Nutrition  Discharge: Nutrition Assessments - 09/23/18 0832      MEDFICTS Scores   Pre Score  28    Post Score  28    Score Difference  0       Education Questionnaire Score: Knowledge Questionnaire Score - 09/19/18 1445      Knowledge Questionnaire Score   Pre Score  21/24    Post Score  22/24       Goals reviewed with patient; copy given to patient.Vedanshi graduated from cardiac rehab program today with completion of 24 exercise sessions in Phase II. Pt maintained good attendance and progressed nicely during his participation in rehab as evidenced by increased MET level.   Medication list reconciled. Repeat  PHQ score- 0 .  Pt has made significant lifestyle changes and should be commended for her success. Pt feels she has achieved her goals during cardiac rehab.   Pt may decide to exercise in cardiac maintenance program. Ronnisha was encouraged to continue exercise on her own by walking. We are proud of Kendelle's progress as she increased her distance on her post exercise walk test and lost 4 pounds. Ravinder really enjoyed participating in phase 2 cardiac rehab. Barnet Pall, RN,BSN 09/26/2018 8:39 AM

## 2018-09-21 ENCOUNTER — Ambulatory Visit: Payer: Medicare Other | Admitting: Cardiology

## 2018-09-21 ENCOUNTER — Encounter (HOSPITAL_COMMUNITY): Payer: Medicare Other

## 2018-09-21 ENCOUNTER — Encounter: Payer: Self-pay | Admitting: Cardiology

## 2018-09-21 ENCOUNTER — Ambulatory Visit (HOSPITAL_COMMUNITY): Payer: Medicare Other

## 2018-09-21 VITALS — BP 90/50 | HR 71 | Ht 59.0 in | Wt 178.0 lb

## 2018-09-21 DIAGNOSIS — I10 Essential (primary) hypertension: Secondary | ICD-10-CM

## 2018-09-21 DIAGNOSIS — I2510 Atherosclerotic heart disease of native coronary artery without angina pectoris: Secondary | ICD-10-CM | POA: Diagnosis not present

## 2018-09-21 DIAGNOSIS — I4811 Longstanding persistent atrial fibrillation: Secondary | ICD-10-CM | POA: Diagnosis not present

## 2018-09-21 DIAGNOSIS — E119 Type 2 diabetes mellitus without complications: Secondary | ICD-10-CM

## 2018-09-21 DIAGNOSIS — I5032 Chronic diastolic (congestive) heart failure: Secondary | ICD-10-CM | POA: Diagnosis not present

## 2018-09-21 NOTE — Progress Notes (Signed)
Cardiology Office Note:    Date:  09/21/2018   ID:  TREMEKA Oliver, DOB February 07, 1931, MRN [id number]  PCP:  Kristine Orn, MD  Cardiologist:  Kristine Furbish, MD  Electrophysiologist:  None   Referring MD: Kristine Orn, MD     History of Present Illness:    Kristine Oliver is a 85 y.o. female here for follow-up of newly discovered atrial fibrillation while in cardiac rehab 07/22/2018, CAD, catheterization 03/21/2018, DES mid circumflex, Plavix aspirin normal EF.  After stent, went to the emergency department with constipation impaction.  Following this 1 month later went back to the emergency department chest pain, troponins negative.  Trying to walk [employer] when she can otherwise.  Lives in a condominium with her nephew who is disabled at age 38.  Reminded me that she enjoys watching the deer out of her backyard.  She feels a little bit anxious by this diagnosis but no chest discomfort no shortness of breath.  She feels some fatigue.  She is convinced that is from her atrial fibrillation.  Note, she wanted me to take bipolar disorder off of her past medical history.  She states that she has anxiety only.  09/21/2018-she is here for follow-up of persistent atrial fibrillation following cardioversion on 08/25/2018.  She still feels terrible, cramps in her legs feet and torso.  Continues taking her Eliquis and having some bruising especially since he is taking Plavix as well.  Recent cardiac catheterization and August 2019.  Past Medical History:  Diagnosis Date  . Anxiety   . Bradycardia   . CAD (coronary artery disease)    a. 08/2004 NSTEMI/PCI: LAD 9m/d (2.75 x 28 mm Taxus 2 DES);  b. 10/2010 Cath: LM nl, LAD patent stents w/ jailed septal, LCX 30, RCA 30; c. 06/2013 MV: No ischemia. Small fixed anteroseptal defect, ? scar vs atten. EF 62%; d. 03/2016 MV: EF 57%, Low risk.  . Chronic diastolic heart failure ([place])    a. Echo 7/13:  mod LVH, EF 65-70%, vigorous LV, Gr 1 diast dysfn, mild  LAE; b. 05/2017 Echo: EF 60-65%, mod LVH, no rwma, Gr1 DD, mildly dil LA.  Marland Kitchen Chronic pain disorder   . CKD (chronic kidney disease), stage III ([place])   . DDD (degenerative disc disease), lumbosacral    , Spinal stenosis  . Diabetes mellitus ([place])    , Type II  . GERD (gastroesophageal reflux disease)   . HTN (hypertension)   . Hyperlipidemia   . Hyperthyroidism   . IBS (irritable bowel syndrome)   . Lumbar disc disease   . Nonproliferative diabetic retinopathy associated with type 2 diabetes mellitus ([place])   . Obesity (BMI 30-39.9)   . Peripheral neuropathy    , Associated with DM  . Scoliosis     Past Surgical History:  Procedure Laterality Date  . BACK SURGERY    . CARDIAC CATHETERIZATION    . CARDIOVERSION N/A 08/25/2018   Procedure: CARDIOVERSION;  Surgeon: Jerline Pain, MD;  [facility] ENDOSCOPY;  Service: Cardiovascular;  Laterality: N/A;  . CHOLECYSTECTOMY    . CORONARY STENT INTERVENTION N/A 03/21/2018   Procedure: CORONARY STENT INTERVENTION;  Surgeon: Jettie Booze, MD;  Location: [facility] [place] CV LAB;  Service: Cardiovascular;  Laterality: N/A;  . DILATION AND CURETTAGE OF UTERUS    . HEMILAMINOTOMY LUMBAR SPINE  2002   Rt L5, with decompression and foraminotomy  . INDUCED ABORTION     , x2  . LAMINOTOMY Right   .  LEFT HEART CATH AND CORONARY ANGIOGRAPHY N/A 03/21/2018   Procedure: LEFT HEART CATH AND CORONARY ANGIOGRAPHY;  Surgeon: Jettie Booze, MD;  Location: [place] [facility];  Service: Cardiovascular;  Laterality: N/A;  . LUMBAR [place] SURGERY  07/1999   L5-S1  . PILONIDAL CYST EXCISION    . ROTATOR CUFF REPAIR    . TONSILLECTOMY    . TOTAL ABDOMINAL HYSTERECTOMY W/ BILATERAL SALPINGOOPHORECTOMY      Current Medications: Current Meds  Medication Sig  . ALPRAZolam (XANAX) 0.25 MG tablet Take 0.25 mg by mouth daily as needed for anxiety.   Marland Kitchen amLODipine (NORVASC) 5 MG tablet Take 1 tablet (5 mg total) by mouth daily.  Marland Kitchen antiseptic oral rinse  (BIOTENE) LIQD 15 mLs by Mouth Rinse route as needed for dry mouth.  Marland Kitchen apixaban (ELIQUIS) 5 MG TABS tablet Take 1 tablet (5 mg total) by mouth 2 (two) times daily.  . ASPERCREME LIDOCAINE EX Apply 1 application topically 2 (two) times daily as needed (back/feet pain).  . Carboxymethylcellulose Sod PF (RETAINE CMC) 0.5 % SOLN Place 1 drop into both eyes 2 (two) times daily as needed (dry eyes).  . clindamycin (CLEOCIN T) 1 % lotion Apply 1 application topically 2 (two) times daily.   . clopidogrel (PLAVIX) 75 MG tablet Take 1 tablet (75 mg total) by mouth daily.  . cycloSPORINE (RESTASIS) 0.05 % ophthalmic emulsion Place 1 drop into both eyes 2 (two) times daily as needed (dry eyes).   Marland Kitchen docusate sodium (COLACE) 100 MG capsule Take 100 mg by mouth daily.  . furosemide (LASIX) 40 MG tablet Take 40 mg by mouth daily.  Marland Kitchen gabapentin (NEURONTIN) 100 MG capsule Take 100 mg by mouth 2 (two) times daily.  . isosorbide mononitrate (IMDUR) 30 MG 24 hr tablet TAKE 1 TABLET(30 MG) BY MOUTH DAILY  . levothyroxine (SYNTHROID, LEVOTHROID) 75 MCG tablet Take 75 mcg by mouth daily.  . Melatonin 3 MG TABS Take 3 mg by mouth at bedtime as needed (sleep).  . metFORMIN (GLUCOPHAGE) 500 MG tablet Take 500 mg by mouth 2 (two) times daily with a meal.   . nitroGLYCERIN (NITROSTAT) 0.4 MG SL tablet Place 1 tablet (0.4 mg total) under the tongue every 5 (five) minutes x 3 doses as needed for chest pain.  . Oxycodone HCl 10 MG TABS Take 1 tablet by mouth 4 (four) times daily.  . pantoprazole (PROTONIX) 40 MG tablet Take 1 tablet (40 mg total) by mouth daily.  Marland Kitchen PREMARIN vaginal cream Place 1 Applicatorful vaginally daily as needed (irritation).   . ramipril (ALTACE) 5 MG capsule TAKE 1 CAPSULE(5 MG) BY MOUTH DAILY  . rosuvastatin (CRESTOR) 20 MG tablet Take 1 tablet (20 mg total) by mouth daily at 6 PM.  . sodium chloride (OCEAN) 0.65 % SOLN nasal spray Place 1 spray into both nostrils as needed for congestion (dryness).    . valACYclovir (VALTREX) 500 MG tablet Take 500 mg by mouth 2 (two) times daily.   . vitamin B-12 (CYANOCOBALAMIN) 500 MCG tablet Take 500 mcg by mouth daily.  Marland Kitchen zolpidem (AMBIEN) 10 MG tablet Take 10 mg by mouth at bedtime as needed for sleep.      Allergies:   Pravastatin and Prednisone   Social History   Socioeconomic History  . Marital status: Divorced    Spouse name: Not on file  . Number of children: Not on file  . Years of education: Not on file  . Highest education level: Not on file  Occupational History  . Occupation: retired  Scientific laboratory technician  . Financial resource strain: Not on file  . Food insecurity:    Worry: Not on file    Inability: Not on file  . Transportation needs:    Medical: Not on file    Non-medical: Not on file  Tobacco Use  . Smoking status: Former Smoker    Years: 35.00  . Smokeless tobacco: Never Used  Substance and Sexual Activity  . Alcohol use: No    Comment: social drinking, not often  . Drug use: No  . Sexual activity: Not on file  Lifestyle  . Physical activity:    Days per week: Not on file    Minutes per session: Not on file  . Stress: Not on file  Relationships  . Social connections:    Talks on phone: Not on file    Gets together: Not on file    Attends religious service: Not on file    Active member of club or organization: Not on file    Attends meetings of clubs or organizations: Not on file    Relationship status: Not on file  Other Topics Concern  . Not on file  Social History Narrative   Divorced.  Lives with her nephew in [place].  She walks most days of the week.     Family History: The patient's family history includes Coronary artery disease in her mother; Diabetes in her father; Heart failure in her father.  ROS:   Please see the history of present illness.      EKGs/Labs/Other Studies Reviewed:    The following studies were reviewed today: Cardiac rehab notes, office notes, lab work EKG  Cardiac  catheterization 03/21/2018: Diagnostic  Dominance: Right    Intervention      EKG: EKG from 07/22/2018 shows atrial fibrillation heart rate 87 bpm personally reviewed and interpreted.  EKG is ordered today.  The ekg ordered today demonstrates 08/04/2018-A. fib 76 nonspecific ST-T wave changes.  Recent Labs: 04/27/2018: NT-Pro BNP 2,300 08/04/2018: BUN 25; Creatinine, Ser 1.53; Hemoglobin 11.1; Platelets 303; Potassium 4.6; Sodium 139  Recent Lipid Panel    Component Value Date/Time   CHOL 142 03/19/2018 0423   TRIG 103 03/19/2018 0423   HDL 30 (L) 03/19/2018 0423   CHOLHDL 4.7 03/19/2018 0423   VLDL 21 03/19/2018 0423   LDLCALC 91 03/19/2018 0423    Physical Exam:    VS:  BP (!) 90/50   Pulse 71   Ht 4\' 11"  (1.499 m)   Wt 178 lb (80.7 kg)   SpO2 94%   BMI 35.95 kg/m     Wt Readings from Last 3 Encounters:  09/21/18 178 lb (80.7 kg)  09/19/18 171 lb 15.3 oz (78 kg)  08/04/18 175 lb 1.9 oz (79.4 kg)     GEN: Elderly, in no acute distress  HEENT: normal  Neck: no JVD, carotid bruits, or masses Cardiac: irreg ; no murmurs, rubs, or gallops,no edema  Respiratory:  clear to auscultation bilaterally, normal work of breathing GI: soft, nontender, nondistended, + BS MS: no deformity or atrophy  Skin: warm and dry, no rash Neuro:  Alert and Oriented x 3, Strength and sensation are intact Psych: euthymic mood, full affect   ASSESSMENT:    1. Coronary artery disease involving native coronary artery of native heart without angina pectoris   2. Chronic diastolic heart failure ([place])   3. Diabetes mellitus with coincident hypertension ([place])   4. Longstanding persistent atrial  fibrillation    PLAN:    In order of problems listed above:  Longstanding persistent atrial fibrillation -Recently in the end of December 2019 she was noted to be in atrial fibrillation and we set her up for cardioversion on 08/25/2018.  This was successful at that time. - On 07/29/2018 she returned to  cardiac rehab but was noted to remain in atrial fibrillation.  Vitals stable.  Heart rate was in the 80s.  Anticoagulation was started with Eliquis on 07/22/2018 but now taking BID from 08/03/18, aspirin was stopped.  She remains on Plavix.  -Still feels fatigued and tired.  Upon check of EKG on 09/21/2017 on follow-up she is back in atrial fibrillation with good rate control.  Her last hemoglobin was stable at 11.1.  She is off of her estrogen supplement as well.  Discussed the affirm trial with her.  We will go ahead and continue with rate control.  I am concerned that if we used amiodarone that she would end up with a pacemaker and feel worse.  CAD - Recent stent October 2019.  Continue with Plavix given her prior early generation DES.  Sinus bradycardia -Has seen EP, Dr. Rayann Heman for pacemaker consultation.  Mild first-degree AV block.  Did not wish to go forward with pacemaker.  Avoiding AV nodal blocking agents.  Overall doing well.   Diabetes with hypertension -Metformin, doing well, continuing with Norvasc.  No changes made.  Medication Adjustments/Labs and Tests Ordered: Current medicines are reviewed at length with the patient today.  Concerns regarding medicines are outlined above.  No orders of the defined types were placed in this encounter.  No orders of the defined types were placed in this encounter.   Patient Instructions  Medication Instructions:  The current medical regimen is effective;  continue present plan and medications.  If you need a refill on your cardiac medications before your next appointment, please call your pharmacy.   Follow-Up: At [facility], you and your health needs are our priority.  As part of our continuing mission to provide you with exceptional heart care, we have created designated Provider Care Teams.  These Care Teams include your primary Cardiologist (physician) and Advanced Practice Providers (APPs -  Physician Assistants and Nurse  Practitioners) who all work together to provide you with the care you need, when you need it. You will need a follow up appointment in 6 months.  Please call our office 2 months in advance to schedule this appointment.  You may see Kristine Furbish, MD or one of the following Advanced Practice Providers on your designated Care Team:   Truitt Merle, NP Cecilie Kicks, NP . Kathyrn Drown, NP  Thank you for choosing [facility]!!         Signed, Kristine Furbish, MD  09/21/2018 5:09 PM    [place]

## 2018-09-21 NOTE — Patient Instructions (Signed)
Medication Instructions:  The current medical regimen is effective;  continue present plan and medications.  If you need a refill on your cardiac medications before your next appointment, please call your pharmacy.   Follow-Up: At [facility], you and your health needs are our priority.  As part of our continuing mission to provide you with exceptional heart care, we have created designated Provider Care Teams.  These Care Teams include your primary Cardiologist (physician) and Advanced Practice Providers (APPs -  Physician Assistants and Nurse Practitioners) who all work together to provide you with the care you need, when you need it. You will need a follow up appointment in 6 months.  Please call our office 2 months in advance to schedule this appointment.  You may see Mark Skains, MD or one of the following Advanced Practice Providers on your designated Care Team:   Lori Gerhardt, NP Laura Ingold, NP . Jill McDaniel, NP  Thank you for choosing [facility]!!       

## 2018-09-27 NOTE — Addendum Note (Signed)
Addended by: Maren Beach, Duel Conrad A on: 09/27/2018 08:13 AM   Modules accepted: Orders

## 2018-09-29 NOTE — Addendum Note (Signed)
Addended by: Shellia Cleverly on: 09/29/2018 01:49 PM   Modules accepted: Orders

## 2018-10-03 ENCOUNTER — Telehealth (HOSPITAL_COMMUNITY): Payer: Self-pay | Admitting: *Deleted

## 2018-10-03 NOTE — Telephone Encounter (Signed)
Received cardiac rehab maintenance program referral and contacted patient to schedule. Pt has decided to hold enrolling in the maintenance program at this time. Pt states she's walking 3 days/week at home and stretching. Pt may decide to participate in the program in the future.

## 2018-10-29 ENCOUNTER — Emergency Department (HOSPITAL_COMMUNITY): Payer: Medicare Other

## 2018-10-29 ENCOUNTER — Other Ambulatory Visit: Payer: Self-pay

## 2018-10-29 ENCOUNTER — Inpatient Hospital Stay (HOSPITAL_COMMUNITY)
Admission: EM | Admit: 2018-10-29 | Discharge: 2018-10-31 | DRG: 291 | Disposition: A | Discharge status: Home / Self Care | Payer: Medicare Other | Attending: Internal Medicine | Admitting: Internal Medicine

## 2018-10-29 ENCOUNTER — Encounter (HOSPITAL_COMMUNITY): Payer: Self-pay | Admitting: Emergency Medicine

## 2018-10-29 DIAGNOSIS — Z833 Family history of diabetes mellitus: Secondary | ICD-10-CM

## 2018-10-29 DIAGNOSIS — I252 Old myocardial infarction: Secondary | ICD-10-CM | POA: Diagnosis not present

## 2018-10-29 DIAGNOSIS — I482 Chronic atrial fibrillation, unspecified: Secondary | ICD-10-CM | POA: Diagnosis present

## 2018-10-29 DIAGNOSIS — Z79899 Other long term (current) drug therapy: Secondary | ICD-10-CM

## 2018-10-29 DIAGNOSIS — E1122 Type 2 diabetes mellitus with diabetic chronic kidney disease: Secondary | ICD-10-CM | POA: Diagnosis not present

## 2018-10-29 DIAGNOSIS — Z6832 Body mass index (BMI) 32.0-32.9, adult: Secondary | ICD-10-CM | POA: Diagnosis not present

## 2018-10-29 DIAGNOSIS — I5032 Chronic diastolic (congestive) heart failure: Secondary | ICD-10-CM

## 2018-10-29 DIAGNOSIS — E1142 Type 2 diabetes mellitus with diabetic polyneuropathy: Secondary | ICD-10-CM | POA: Diagnosis present

## 2018-10-29 DIAGNOSIS — F1120 Opioid dependence, uncomplicated: Secondary | ICD-10-CM | POA: Diagnosis present

## 2018-10-29 DIAGNOSIS — E1159 Type 2 diabetes mellitus with other circulatory complications: Secondary | ICD-10-CM | POA: Diagnosis not present

## 2018-10-29 DIAGNOSIS — Z8249 Family history of ischemic heart disease and other diseases of the circulatory system: Secondary | ICD-10-CM

## 2018-10-29 DIAGNOSIS — Z7984 Long term (current) use of oral hypoglycemic drugs: Secondary | ICD-10-CM | POA: Diagnosis not present

## 2018-10-29 DIAGNOSIS — Z87891 Personal history of nicotine dependence: Secondary | ICD-10-CM | POA: Diagnosis not present

## 2018-10-29 DIAGNOSIS — E785 Hyperlipidemia, unspecified: Secondary | ICD-10-CM | POA: Diagnosis not present

## 2018-10-29 DIAGNOSIS — I25110 Atherosclerotic heart disease of native coronary artery with unstable angina pectoris: Secondary | ICD-10-CM | POA: Diagnosis present

## 2018-10-29 DIAGNOSIS — I4819 Other persistent atrial fibrillation: Secondary | ICD-10-CM | POA: Diagnosis not present

## 2018-10-29 DIAGNOSIS — N179 Acute kidney failure, unspecified: Secondary | ICD-10-CM

## 2018-10-29 DIAGNOSIS — N183 Chronic kidney disease, stage 3 (moderate): Secondary | ICD-10-CM | POA: Diagnosis present

## 2018-10-29 DIAGNOSIS — Z7901 Long term (current) use of anticoagulants: Secondary | ICD-10-CM

## 2018-10-29 DIAGNOSIS — I200 Unstable angina: Secondary | ICD-10-CM | POA: Diagnosis not present

## 2018-10-29 DIAGNOSIS — I130 Hypertensive heart and chronic kidney disease with heart failure and stage 1 through stage 4 chronic kidney disease, or unspecified chronic kidney disease: Secondary | ICD-10-CM | POA: Diagnosis not present

## 2018-10-29 DIAGNOSIS — I4811 Longstanding persistent atrial fibrillation: Secondary | ICD-10-CM | POA: Diagnosis present

## 2018-10-29 DIAGNOSIS — K219 Gastro-esophageal reflux disease without esophagitis: Secondary | ICD-10-CM | POA: Diagnosis present

## 2018-10-29 DIAGNOSIS — Z79890 Hormone replacement therapy (postmenopausal): Secondary | ICD-10-CM

## 2018-10-29 DIAGNOSIS — E039 Hypothyroidism, unspecified: Secondary | ICD-10-CM | POA: Diagnosis present

## 2018-10-29 DIAGNOSIS — Z7902 Long term (current) use of antithrombotics/antiplatelets: Secondary | ICD-10-CM

## 2018-10-29 DIAGNOSIS — Z955 Presence of coronary angioplasty implant and graft: Secondary | ICD-10-CM | POA: Diagnosis not present

## 2018-10-29 DIAGNOSIS — I248 Other forms of acute ischemic heart disease: Secondary | ICD-10-CM | POA: Diagnosis present

## 2018-10-29 DIAGNOSIS — I5033 Acute on chronic diastolic (congestive) heart failure: Secondary | ICD-10-CM

## 2018-10-29 DIAGNOSIS — E669 Obesity, unspecified: Secondary | ICD-10-CM | POA: Diagnosis present

## 2018-10-29 DIAGNOSIS — G8929 Other chronic pain: Secondary | ICD-10-CM | POA: Diagnosis present

## 2018-10-29 DIAGNOSIS — R079 Chest pain, unspecified: Secondary | ICD-10-CM

## 2018-10-29 DIAGNOSIS — M25512 Pain in left shoulder: Secondary | ICD-10-CM | POA: Diagnosis present

## 2018-10-29 LAB — CBC
HCT: 36.4 % (ref 36.0–46.0)
Hemoglobin: 11 g/dL — ABNORMAL LOW (ref 12.0–15.0)
MCH: 29.1 pg (ref 26.0–34.0)
MCHC: 30.2 g/dL (ref 30.0–36.0)
MCV: 96.3 fL (ref 80.0–100.0)
Platelets: 216 10*3/uL (ref 150–400)
RBC: 3.78 MIL/uL — ABNORMAL LOW (ref 3.87–5.11)
RDW: 17.1 % — AB (ref 11.5–15.5)
WBC: 8.3 10*3/uL (ref 4.0–10.5)
nRBC: 0 % (ref 0.0–0.2)

## 2018-10-29 LAB — HEMOGLOBIN A1C
HEMOGLOBIN A1C: 7.2 % — AB (ref 4.8–5.6)
Mean Plasma Glucose: 159.94 mg/dL

## 2018-10-29 LAB — BASIC METABOLIC PANEL
Anion gap: 8 (ref 5–15)
BUN: 22 mg/dL (ref 8–23)
CO2: 23 mmol/L (ref 22–32)
Calcium: 10.8 mg/dL — ABNORMAL HIGH (ref 8.9–10.3)
Chloride: 106 mmol/L (ref 98–111)
Creatinine, Ser: 1.47 mg/dL — ABNORMAL HIGH (ref 0.44–1.00)
GFR calc Af Amer: 37 mL/min — ABNORMAL LOW (ref >60)
GFR calc non Af Amer: 32 mL/min — ABNORMAL LOW (ref >60)
Glucose, Bld: 128 mg/dL — ABNORMAL HIGH (ref 70–99)
Potassium: 4.2 mmol/L (ref 3.5–5.1)
Sodium: 137 mmol/L (ref 135–145)

## 2018-10-29 LAB — TSH: TSH: 1.164 u[IU]/mL (ref 0.350–4.500)

## 2018-10-29 LAB — PLATELET INHIBITION P2Y12: Platelet Function  P2Y12: 153 [PRU] — ABNORMAL LOW (ref 182–335)

## 2018-10-29 LAB — GLUCOSE, CAPILLARY: Glucose-Capillary: 221 mg/dL — ABNORMAL HIGH (ref 70–99)

## 2018-10-29 MED ORDER — AMLODIPINE BESYLATE 5 MG PO TABS: 5.0000 mg | ORAL_TABLET | Freq: Every day | ORAL | Status: DC

## 2018-10-29 MED ORDER — INSULIN ASPART 100 UNIT/ML ~~LOC~~ SOLN
0.0000 [IU] | Freq: Three times a day (TID) | SUBCUTANEOUS | Status: DC
  Administered 2018-10-30: 2 [IU] via SUBCUTANEOUS

## 2018-10-29 MED ORDER — GABAPENTIN 100 MG PO CAPS
100.0000 mg | ORAL_CAPSULE | Freq: Two times a day (BID) | ORAL | Status: DC
  Administered 2018-10-29 – 2018-10-31 (×4): 100 mg via ORAL
  Filled 2018-10-29 (×4): qty 1

## 2018-10-29 MED ORDER — ONDANSETRON HCL 4 MG/2ML IJ SOLN: 4.0000 mg | Freq: Four times a day (QID) | INTRAMUSCULAR | Status: DC | PRN

## 2018-10-29 MED ORDER — HEPARIN (PORCINE) 25000 UT/250ML-% IV SOLN
850.0000 [IU]/h | INTRAVENOUS | Status: DC
  Administered 2018-10-29: 700 [IU]/h via INTRAVENOUS
  Filled 2018-10-29: qty 250

## 2018-10-29 MED ORDER — FUROSEMIDE 20 MG PO TABS
20.0000 mg | ORAL_TABLET | Freq: Every day | ORAL | Status: DC
  Filled 2018-10-29: qty 1

## 2018-10-29 MED ORDER — LEVOTHYROXINE SODIUM 75 MCG PO TABS
75.0000 ug | ORAL_TABLET | Freq: Every day | ORAL | Status: DC
  Administered 2018-10-30 – 2018-10-31 (×2): 75 ug via ORAL
  Filled 2018-10-29 (×2): qty 1

## 2018-10-29 MED ORDER — ZOLPIDEM TARTRATE 5 MG PO TABS: 5.0000 mg | ORAL_TABLET | Freq: Every evening | ORAL | Status: DC | PRN

## 2018-10-29 MED ORDER — NITROGLYCERIN 0.4 MG SL SUBL: 0.4000 mg | SUBLINGUAL_TABLET | SUBLINGUAL | Status: DC | PRN

## 2018-10-29 MED ORDER — ALPRAZOLAM 0.25 MG PO TABS
0.2500 mg | ORAL_TABLET | Freq: Two times a day (BID) | ORAL | Status: DC | PRN
  Administered 2018-10-30 (×2): 0.25 mg via ORAL
  Filled 2018-10-29 (×2): qty 1

## 2018-10-29 MED ORDER — LIDOCAINE 4 % EX PTCH: 1.0000 "application " | MEDICATED_PATCH | Freq: Two times a day (BID) | CUTANEOUS | Status: DC | PRN

## 2018-10-29 MED ORDER — VITAMIN B-12 1000 MCG PO TABS
500.0000 ug | ORAL_TABLET | Freq: Every day | ORAL | Status: DC
  Administered 2018-10-30 – 2018-10-31 (×2): 500 ug via ORAL
  Filled 2018-10-29 (×3): qty 1

## 2018-10-29 MED ORDER — INSULIN ASPART 100 UNIT/ML ~~LOC~~ SOLN
0.0000 [IU] | Freq: Every day | SUBCUTANEOUS | Status: DC
  Administered 2018-10-29: 2 [IU] via SUBCUTANEOUS

## 2018-10-29 MED ORDER — SODIUM CHLORIDE 0.9 % IV SOLN: 250.0000 mL | INTRAVENOUS | Status: DC | PRN

## 2018-10-29 MED ORDER — ROSUVASTATIN CALCIUM 20 MG PO TABS
20.0000 mg | ORAL_TABLET | Freq: Every day | ORAL | Status: DC
  Administered 2018-10-29 – 2018-10-30 (×2): 20 mg via ORAL
  Filled 2018-10-29 (×2): qty 1

## 2018-10-29 MED ORDER — SALINE SPRAY 0.65 % NA SOLN
1.0000 | NASAL | Status: DC | PRN
  Filled 2018-10-29: qty 44

## 2018-10-29 MED ORDER — DOCUSATE SODIUM 100 MG PO CAPS
100.0000 mg | ORAL_CAPSULE | Freq: Every day | ORAL | Status: DC
  Administered 2018-10-30 – 2018-10-31 (×2): 100 mg via ORAL
  Filled 2018-10-29 (×2): qty 1

## 2018-10-29 MED ORDER — OXYCODONE HCL 5 MG PO TABS
10.0000 mg | ORAL_TABLET | Freq: Once | ORAL | Status: AC
  Administered 2018-10-29: 10 mg via ORAL
  Filled 2018-10-29: qty 2

## 2018-10-29 MED ORDER — MELATONIN 3 MG PO TABS
3.0000 mg | ORAL_TABLET | Freq: Every evening | ORAL | Status: DC | PRN
  Administered 2018-10-29 – 2018-10-30 (×2): 3 mg via ORAL
  Filled 2018-10-29 (×4): qty 1

## 2018-10-29 MED ORDER — ACETAMINOPHEN 325 MG PO TABS
650.0000 mg | ORAL_TABLET | ORAL | Status: DC | PRN
  Administered 2018-10-30: 650 mg via ORAL
  Filled 2018-10-29: qty 2

## 2018-10-29 MED ORDER — SODIUM CHLORIDE 0.9% FLUSH
3.0000 mL | INTRAVENOUS | Status: DC | PRN
  Administered 2018-10-30: 3 mL via INTRAVENOUS
  Filled 2018-10-29: qty 3

## 2018-10-29 MED ORDER — ALPRAZOLAM 0.25 MG PO TABS: 0.2500 mg | ORAL_TABLET | Freq: Every day | ORAL | Status: DC | PRN

## 2018-10-29 MED ORDER — OXYCODONE HCL 5 MG PO TABS
10.0000 mg | ORAL_TABLET | Freq: Four times a day (QID) | ORAL | Status: DC
  Administered 2018-10-29 – 2018-10-30 (×3): 10 mg via ORAL
  Filled 2018-10-29 (×3): qty 2

## 2018-10-29 MED ORDER — CARBOXYMETHYLCELLULOSE SOD PF 0.5 % OP SOLN: 1.0000 [drp] | Freq: Two times a day (BID) | OPHTHALMIC | Status: DC | PRN

## 2018-10-29 MED ORDER — PANTOPRAZOLE SODIUM 40 MG PO TBEC
40.0000 mg | DELAYED_RELEASE_TABLET | Freq: Every day | ORAL | Status: DC
  Administered 2018-10-30 – 2018-10-31 (×2): 40 mg via ORAL
  Filled 2018-10-29 (×2): qty 1

## 2018-10-29 MED ORDER — SODIUM CHLORIDE 0.9% FLUSH
3.0000 mL | Freq: Two times a day (BID) | INTRAVENOUS | Status: DC
  Administered 2018-10-29 – 2018-10-30 (×2): 3 mL via INTRAVENOUS

## 2018-10-29 MED ORDER — CLOPIDOGREL BISULFATE 75 MG PO TABS
75.0000 mg | ORAL_TABLET | Freq: Every day | ORAL | Status: DC
  Administered 2018-10-31: 75 mg via ORAL
  Filled 2018-10-29 (×2): qty 1

## 2018-10-29 MED ORDER — CLINDAMYCIN PHOSPHATE 1 % EX LOTN: 1.0000 "application " | TOPICAL_LOTION | Freq: Two times a day (BID) | CUTANEOUS | Status: DC

## 2018-10-29 MED ORDER — AMLODIPINE BESYLATE 2.5 MG PO TABS
2.5000 mg | ORAL_TABLET | Freq: Every day | ORAL | Status: DC
  Administered 2018-10-30 – 2018-10-31 (×2): 2.5 mg via ORAL
  Filled 2018-10-29 (×2): qty 1

## 2018-10-29 MED ORDER — RAMIPRIL 5 MG PO CAPS
5.0000 mg | ORAL_CAPSULE | Freq: Every day | ORAL | Status: DC
  Administered 2018-10-30 – 2018-10-31 (×2): 5 mg via ORAL
  Filled 2018-10-29: qty 1
  Filled 2018-10-29: qty 2
  Filled 2018-10-29: qty 1
  Filled 2018-10-29: qty 2

## 2018-10-29 MED ORDER — VALACYCLOVIR HCL 500 MG PO TABS
500.0000 mg | ORAL_TABLET | Freq: Two times a day (BID) | ORAL | Status: DC
  Administered 2018-10-29 – 2018-10-31 (×4): 500 mg via ORAL
  Filled 2018-10-29 (×4): qty 1

## 2018-10-29 MED ORDER — LIDOCAINE 5 % EX PTCH
1.0000 | MEDICATED_PATCH | Freq: Two times a day (BID) | CUTANEOUS | Status: DC | PRN
  Administered 2018-10-30 (×2): 1 via TRANSDERMAL
  Filled 2018-10-29 (×3): qty 1

## 2018-10-29 MED ORDER — POLYVINYL ALCOHOL 1.4 % OP SOLN
1.0000 [drp] | OPHTHALMIC | Status: DC | PRN
  Filled 2018-10-29: qty 15

## 2018-10-29 MED ORDER — CYCLOSPORINE 0.05 % OP EMUL
1.0000 [drp] | Freq: Two times a day (BID) | OPHTHALMIC | Status: DC | PRN
  Filled 2018-10-29: qty 30

## 2018-10-29 MED ORDER — SODIUM CHLORIDE 0.9% FLUSH: 3.0000 mL | Freq: Once | INTRAVENOUS | Status: DC

## 2018-10-29 MED ORDER — ASPIRIN 81 MG PO CHEW: 324.0000 mg | CHEWABLE_TABLET | Freq: Once | ORAL | Status: DC

## 2018-10-29 NOTE — Consult Note (Addendum)
Error

## 2018-10-29 NOTE — Progress Notes (Signed)
Attempt to callback for report, no answer, will try again.

## 2018-10-29 NOTE — H&P (Addendum)
Cardiology History and Physical:   Patient ID: [name]; [id number]; Dec 22, 1930   Admit date: 10/29/2018 Date of Consult: 10/29/2018  Primary Care Provider: Lavone Orn, MD Primary Cardiologist: [name], MD 09/21/2018 Primary Electrophysiologist:  None   Patient Profile:   Kristine Oliver is a 85 y.o. female with a hx of NSTEMI 2006 w/ DES LAD, last cath 03/2018 w/ DES CFX, D-CHF, CKD III, DM, HTN, HLD, hypothyroid, obesity, Afib dx 07/2018, who is being seen today for the evaluation of chest pain at the request of Dr Billy Fischer.  History of Present Illness:   Ms. Salminen was seen by Dr Marlou Porch 09/21/2018, considered to have longstanding persistent atrial fibrillation.  She complained of fatigue and bruising, but she had a DES circumflex 03/2018 so needs to stay on Plavix and her CHA2DS2-VASc = 7 (age x 2, female, CAD, HTN, DM, CHF), so needs to continue Eliquis.  She has a bad back, that limits her mobility. She has a rollator and walks in the park with that. She did that 2 days ago, no problems. She has not had any bad feelings from her heart. No recent chest pain. She had some about a month ago, took 2 nitro and it resolved.   Today, she took her pills and stood up to go take a shower. She had sudden onset of chest pressure>>L arm and up her neck. She felt the pain was very deep and covered a larger area than usual. The pain was 8-9/10, her usual angina, but a little worse. The pain made her SOB and a little nauseated. No vomiting or diaphoresis.   She took SL NTG x 2, they helped a little, not very much. She called EMS. They gave her ASA 324 mg and SL NTG x 2. By arrival at the ER, her CP was still 7-8/10. She got oxy and her CP is down to a 2/10.   Chronic L shoulder pain since rotator cuff surgery, so that still hurts.   Does not feel her heart skip or race.   Occ SOB, no orthopnea or PND. Has DOE chronically, no recent change. Feels better when she stays active.    Past  Medical History:  Diagnosis Date  . Anxiety   . Bradycardia   . CAD (coronary artery disease)    a. 08/2004 NSTEMI/PCI: LAD 7m/d (2.75 x 28 mm Taxus 2 DES);  b. 10/2010 Cath: LM nl, LAD patent stents w/ jailed septal, LCX 30, RCA 30; c. 06/2013 MV: No ischemia. Small fixed anteroseptal defect, ? scar vs atten. EF 62%; d. 03/2016 MV: EF 57%, Low risk.  . Chronic diastolic heart failure ([place])    a. Echo 7/13:  mod LVH, EF 65-70%, vigorous LV, Gr 1 diast dysfn, mild LAE; b. 05/2017 Echo: EF 60-65%, mod LVH, no rwma, Gr1 DD, mildly dil LA.  Marland Kitchen Chronic pain disorder   . CKD (chronic kidney disease), stage III ([place])   . DDD (degenerative disc disease), lumbosacral    , Spinal stenosis  . Diabetes mellitus ([place])    , Type II  . GERD (gastroesophageal reflux disease)   . HTN (hypertension)   . Hyperlipidemia   . Hypothyroidism   . IBS (irritable bowel syndrome)   . Lumbar disc disease   . Nonproliferative diabetic retinopathy associated with type 2 diabetes mellitus ([place])   . Obesity (BMI 30-39.9)   . Peripheral neuropathy    , Associated with DM  . Scoliosis     Past Surgical  History:  Procedure Laterality Date  . BACK SURGERY    . CARDIAC CATHETERIZATION    . CARDIOVERSION N/A 08/25/2018   Procedure: CARDIOVERSION;  Surgeon: Jerline Pain, MD;  Location: [facility] ENDOSCOPY;  Service: Cardiovascular;  Laterality: N/A;  . CHOLECYSTECTOMY    . CORONARY STENT INTERVENTION N/A 03/21/2018   Procedure: CORONARY STENT INTERVENTION;  Surgeon: Jettie Booze, MD;  Location: [place] CV LAB;  Service: Cardiovascular;  Laterality: N/A;  . DILATION AND CURETTAGE OF UTERUS    . HEMILAMINOTOMY LUMBAR SPINE  2002   Rt L5, with decompression and foraminotomy  . INDUCED ABORTION     , x2  . LAMINOTOMY Right   . LEFT HEART CATH AND CORONARY ANGIOGRAPHY N/A 03/21/2018   Procedure: LEFT HEART CATH AND CORONARY ANGIOGRAPHY;  Surgeon: Jettie Booze, MD;  Location: [place] LAB;  Service:  Cardiovascular;  Laterality: N/A;  . LUMBAR [place] SURGERY  07/1999   L5-S1  . PILONIDAL CYST EXCISION    . ROTATOR CUFF REPAIR    . TONSILLECTOMY    . TOTAL ABDOMINAL HYSTERECTOMY W/ BILATERAL SALPINGOOPHORECTOMY       Prior to Admission medications   Medication Sig Start Date End Date Taking? Authorizing Provider  ALPRAZolam Duanne Moron) 0.25 MG tablet Take 0.25 mg by mouth daily as needed for anxiety.     [provider]  amLODipine (NORVASC) 5 MG tablet Take 1 tablet (5 mg total) by mouth daily. 03/23/18   Kroeger, Lorelee Cover., PA-C  antiseptic oral rinse (BIOTENE) LIQD 15 mLs by Mouth Rinse route as needed for dry mouth.    [provider]  apixaban (ELIQUIS) 5 MG TABS tablet Take 1 tablet (5 mg total) by mouth 2 (two) times daily. 08/04/18   Jerline Pain, MD  ASPERCREME LIDOCAINE EX Apply 1 application topically 2 (two) times daily as needed (back/feet pain).    [provider]  Carboxymethylcellulose Sod PF (RETAINE CMC) 0.5 % SOLN Place 1 drop into both eyes 2 (two) times daily as needed (dry eyes).    [provider]  clindamycin (CLEOCIN T) 1 % lotion Apply 1 application topically 2 (two) times daily.  11/16/13   [provider]  clopidogrel (PLAVIX) 75 MG tablet Take 1 tablet (75 mg total) by mouth daily. 03/23/18   Kroeger, Lorelee Cover., PA-C  cycloSPORINE (RESTASIS) 0.05 % ophthalmic emulsion Place 1 drop into both eyes 2 (two) times daily as needed (dry eyes).     [provider]  docusate sodium (COLACE) 100 MG capsule Take 100 mg by mouth daily.    [provider]  furosemide (LASIX) 40 MG tablet Take 40 mg by mouth daily.    [provider]  gabapentin (NEURONTIN) 100 MG capsule Take 100 mg by mouth 2 (two) times daily.    [provider]  isosorbide mononitrate (IMDUR) 30 MG 24 hr tablet TAKE 1 TABLET(30 MG) BY MOUTH DAILY 08/01/18   Kroeger, Daleen Snook M., PA-C  levothyroxine (SYNTHROID, LEVOTHROID) 75 MCG tablet  Take 75 mcg by mouth daily.    [provider]  Melatonin 3 MG TABS Take 3 mg by mouth at bedtime as needed (sleep).    [provider]  metFORMIN (GLUCOPHAGE) 500 MG tablet Take 500 mg by mouth 2 (two) times daily with a meal.  03/17/16   [provider]  nitroGLYCERIN (NITROSTAT) 0.4 MG SL tablet Place 1 tablet (0.4 mg total) under the tongue every 5 (five) minutes x 3 doses  as needed for chest pain. 03/22/18   Kroeger, Lorelee Cover., PA-C  Oxycodone HCl 10 MG TABS Take 1 tablet by mouth 4 (four) times daily. 05/22/15   [provider]  pantoprazole (PROTONIX) 40 MG tablet Take 1 tablet (40 mg total) by mouth daily. 03/23/18   Kroeger, Lorelee Cover., PA-C  PREMARIN vaginal cream Place 1 Applicatorful vaginally daily as needed (irritation).  10/06/13   [provider]  ramipril (ALTACE) 5 MG capsule TAKE 1 CAPSULE(5 MG) BY MOUTH DAILY 07/19/18   Kroeger, Daleen Snook M., PA-C  rosuvastatin (CRESTOR) 20 MG tablet Take 1 tablet (20 mg total) by mouth daily at 6 PM. 03/22/18   Kroeger, Lorelee Cover., PA-C  sodium chloride (OCEAN) 0.65 % SOLN nasal spray Place 1 spray into both nostrils as needed for congestion (dryness).    [provider]  valACYclovir (VALTREX) 500 MG tablet Take 500 mg by mouth 2 (two) times daily.     [provider]  vitamin B-12 (CYANOCOBALAMIN) 500 MCG tablet Take 500 mcg by mouth daily.    [provider]  zolpidem (AMBIEN) 10 MG tablet Take 10 mg by mouth at bedtime as needed for sleep.     [provider]    Inpatient Medications: Scheduled Meds: . sodium chloride flush  3 mL Intravenous Once   Continuous Infusions:  PRN Meds: nitroGLYCERIN  Allergies:    Allergies  Allergen Reactions  . Pravastatin Other (See Comments)    Muscle aches  . Prednisone Nausea And Vomiting    Social History:   Social History   Socioeconomic History  . Marital status: Divorced    Spouse name: Not on file  . Number of  children: Not on file  . Years of education: Not on file  . Highest education level: Not on file  Occupational History  . Occupation: retired  Scientific laboratory technician  . Financial resource strain: Not on file  . Food insecurity:    Worry: Not on file    Inability: Not on file  . Transportation needs:    Medical: Not on file    Non-medical: Not on file  Tobacco Use  . Smoking status: Former Smoker    Years: 35.00  . Smokeless tobacco: Never Used  Substance and Sexual Activity  . Alcohol use: No    Comment: social drinking, not often  . Drug use: No  . Sexual activity: Not on file  Lifestyle  . Physical activity:    Days per week: Not on file    Minutes per session: Not on file  . Stress: Not on file  Relationships  . Social connections:    Talks on phone: Not on file    Gets together: Not on file    Attends religious service: Not on file    Active member of club or organization: Not on file    Attends meetings of clubs or organizations: Not on file    Relationship status: Not on file  . Intimate partner violence:    Fear of current or ex partner: Not on file    Emotionally abused: Not on file    Physically abused: Not on file    Forced sexual activity: Not on file  Other Topics Concern  . Not on file  Social History Narrative   Divorced.  Lives with her nephew in [place].  She walks most days of the week.    Family History:   Family History  Problem Relation Age of Onset  . Heart  failure Father   . Diabetes Father   . Coronary artery disease Mother    Family Status:  Family Status  Relation Name Status  . Father  Deceased at age 61       died of stroke  . Mother  Deceased at age 1       died of old age, had CABG at age 11  . Sister  Alive  . MGM  Deceased  . MGF  Deceased  . PGM  Deceased  . PGF  Deceased    ROS:  Please see the history of present illness.  All other ROS reviewed and negative.     Physical Exam/Data:   Vitals:   10/29/18 1351 10/29/18  1400 10/29/18 1515 10/29/18 1530  BP:  135/60 115/63 (!) 107/53  Pulse:  87 73 70  Resp:  19 14 19   Temp:      TempSrc:      SpO2:  93% 94% 96%  Weight: 76.7 kg      No intake or output data in the 24 hours ending 10/29/18 1713 Filed Weights   10/29/18 1351  Weight: 76.7 kg   Body mass index is 34.13 kg/m.  General:  Well nourished, well developed, in no acute distress HEENT: normal Lymph: no adenopathy Neck: no JVD Endocrine:  No thryomegaly Vascular: No carotid bruits; 4/4 extremity pulses 2+, without bruits  Cardiac:  normal S1, S2; RRR; soft murmur Lungs:  clear to auscultation bilaterally, no wheezing, rhonchi or rales  Abd: soft, nontender, no hepatomegaly  Ext: no edema Musculoskeletal:  No deformities, BUE and BLE strength normal and equal Skin: warm and dry  Neuro:  CNs 2-12 intact, no focal abnormalities noted Psych:  Normal affect   EKG:  The EKG was personally reviewed and demonstrates:  Afib, HR 74, no acute ischemic changes Telemetry:  Telemetry was personally reviewed and demonstrates:  Afib, rate controlled.  Relevant CV Studies:  ECHO: 03/19/2018 - Left ventricle: The cavity size was normal. There was mild   concentric hypertrophy. Systolic function was normal. The   estimated ejection fraction was in the range of 60% to 65%. Wall   motion was normal; there were no regional wall motion   abnormalities. Doppler parameters are consistent with abnormal   left ventricular relaxation (grade 1 diastolic dysfunction).   Doppler parameters are consistent with elevated ventricular   end-diastolic filling pressure. - Aortic valve: There was trivial regurgitation. - Mitral valve: There was mild regurgitation. - Left atrium: The atrium was mildly dilated. - Right ventricle: Systolic function was normal. - Tricuspid valve: There was mild regurgitation. - Pulmonic valve: There was no regurgitation. - Pulmonary arteries: Systolic pressure was within the normal    range. - Inferior vena cava: The vessel was normal in size. - Pericardium, extracardiac: There was no pericardial effusion.  CATH: 03/21/2018:  Prox RCA to Mid RCA lesion is 25% stenosed.  Mid Cx lesion is 90% stenosed.  A drug-eluting stent was successfully placed using a STENT SIERRA 2.75 X 28 MM.  Post intervention, there is a 0% residual stenosis.  Ost Cx to Prox Cx lesion is 50% stenosed.  Previously placed Mid LAD stent (unknown type) is widely patent.  Prox LAD lesion is 25% stenosed.  LV end diastolic pressure is mildly elevated. LVEDP 17 mm Hg.  There is no aortic valve stenosis.  Vasospasm and discomfort in the right arm from the radial approach.  Recommend uninterrupted dual antiplatelet therapy with Aspirin 81mg  daily  and Clopidogrel 75mg  daily for a minimum of 12 months (ACS - Class I recommendation).  Patient was already on Plavix.  COuld consdier Brilinta instead if bleeding risk is low.  Diagnostic  Dominance: Right    Intervention      Laboratory Data:  ChemistryNo results for input(s): NA, K, CL, CO2, GLUCOSE, BUN, CREATININE, CALCIUM, GFRNONAA, GFRAA, ANIONGAP in the last 168 hours.  Lab Results  Component Value Date   ALT 32 06/26/2013   AST 30 06/26/2013   ALKPHOS 102 06/26/2013   BILITOT 0.3 06/26/2013   Hematology Recent Labs  Lab 10/29/18 1355  WBC 8.3  RBC 3.78*  HGB 11.0*  HCT 36.4  MCV 96.3  MCH 29.1  MCHC 30.2  RDW 17.1*  PLT 216   Cardiac EnzymesNo results for input(s): TROPONINI in the last 168 hours. No results for input(s): TROPIPOC in the last 168 hours.   TSH:  Lab Results  Component Value Date   TSH 1.903 05/19/2016   Lipids: Lab Results  Component Value Date   CHOL 142 03/19/2018   HDL 30 (L) 03/19/2018   LDLCALC 91 03/19/2018   TRIG 103 03/19/2018   CHOLHDL 4.7 03/19/2018   HgbA1c: Lab Results  Component Value Date   HGBA1C 6.7 (H) 02/13/2012   Magnesium:  Magnesium  Date Value Ref Range Status   10/26/2010 1.8 1.5 - 2.5 mg/dL Final     Radiology/Studies:  Dg Chest 2 View  Result Date: 10/29/2018 CLINICAL DATA:  Left-sided chest pain. EXAM: CHEST - 2 VIEW COMPARISON:  April 22, 2018. FINDINGS: The cardiomediastinal silhouette is stable. Mild interstitial prominence suggest pulmonary venous congestion. No other changes or acute abnormalities. IMPRESSION: Cardiomegaly and mild pulmonary venous congestion. Electronically Signed   By: Dorise Bullion III M.D   On: 10/29/2018 14:54    Assessment and Plan:   Principal Problem: 1.  Unstable angina (HCC) - Admit, cycle cardiac enzymes. -If cardiac enzymes elevate, cath Monday - If cardiac enzymes remain negative, decide on plan in a.m. -Hold Eliquis and start heparin, no aspirin, continue Plavix, check a P2 Y 12 -Hold Imdur and use nitroglycerin paste.  Change to IV nitro if symptoms are not controlled. -Has had muscle aches with Pravachol, continue Crestor 20 mg a day. -Check profile  Active Problems: 2.  Type 2 diabetes mellitus with vascular disease (HCC) -Hold metformin and use sliding scale insulin  3.  Persistent atrial fibrillation -Not on anything for rate control, there was concern that she would need a pacemaker at some point. - No history of presyncope or syncope, heart rate is normal on telemetry -Follow on telemetry - Hold Eliquis and start heparin  Otherwise, continue home medications   For questions or updates, please contact [place] Please consult [email or web address] for contact info under Cardiology/STEMI.   Signed, Rosaria Ferries, PA-C  10/29/2018 5:13 PM   I have seen, examined the patient, and reviewed the above assessment and plan.  Changes to above are made where necessary.  On exam, RRR.  Pt with known CAD, now admitted wit worsening angina. Will admit to cardiology for further evaluation.  Cycle CMs on heparin drip.  Hold eliquis.  Further CV risk stratification depending on results of CMs and  ability to control chest pain.  Given advanced age, a conservative approach may be best.  Co Sign: Thompson Grayer, MD

## 2018-10-29 NOTE — ED Provider Notes (Signed)
[facility] EMERGENCY DEPARTMENT Provider Note   CSN: [id number] Arrival date & time: 10/29/18  1340    History   Chief Complaint Chief Complaint  Patient presents with  . Chest Pain    HPI Kristine Oliver is a 85 y.o. female.     HPI   85yo female with history of CAD with stents 03/21/2018 Presents with chest pain Began at 11AM, felt similar to prior MI Worse than regular angina, radiating to left arm and neck A lot of pressure right in the middle and left side, worse Received aspirin and nitro with EMS Received nitro, helped was 9/10 now 1/10 MI in the past have been less uncomfortable as this, but this is similar quality Was taking pills when it started, getting ready to eat breakfast when it started Not sure if worsens with exertion today    Past Medical History:  Diagnosis Date  . Anxiety   . Bradycardia   . CAD (coronary artery disease)    a. 08/2004 NSTEMI/PCI: LAD 14m/d (2.75 x 28 mm Taxus 2 DES);  b. 10/2010 Cath: LM nl, LAD patent stents w/ jailed septal, LCX 30, RCA 30; c. 06/2013 MV: No ischemia. Small fixed anteroseptal defect, ? scar vs atten. EF 62%; d. 03/2016 MV: EF 57%, Low risk.  . Chronic diastolic heart failure ([place])    a. Echo 7/13:  mod LVH, EF 65-70%, vigorous LV, Gr 1 diast dysfn, mild LAE; b. 05/2017 Echo: EF 60-65%, mod LVH, no rwma, Gr1 DD, mildly dil LA.  Marland Kitchen Chronic pain disorder   . CKD (chronic kidney disease), stage III ([place])   . DDD (degenerative disc disease), lumbosacral    , Spinal stenosis  . Diabetes mellitus ([place])    , Type II  . GERD (gastroesophageal reflux disease)   . HTN (hypertension)   . Hyperlipidemia   . Hyperthyroidism   . IBS (irritable bowel syndrome)   . Lumbar disc disease   . Nonproliferative diabetic retinopathy associated with type 2 diabetes mellitus ([place])   . Obesity (BMI 30-39.9)   . Peripheral neuropathy    , Associated with DM  . Scoliosis     Patient Active Problem List   Diagnosis Date Noted  . Persistent atrial fibrillation   . Status post coronary artery stent placement   . NSTEMI (non-ST elevated myocardial infarction) ([place]) 03/18/2018  . Essential hypertension 05/19/2016  . Chest pain 05/18/2016  . Bradycardia 09/26/2014  . Sinus bradycardia 10/30/2013  . Fatigue 10/30/2013  . Acute on chronic diastolic congestive heart failure ([place]) 06/26/2013  . CAD (coronary artery disease) 02/12/2012  . Hypercalcemia 02/12/2012  . Chronic pain disorder   . Hypothyroidism   . Type 2 diabetes mellitus with vascular disease ([place])   . Hyperlipidemia   . Lumbar disc disease   . Obesity (BMI 30-39.9)   . Hypertensive heart disease without CHF   . Bipolar affective disorder ([place])   . Scoliosis     Past Surgical History:  Procedure Laterality Date  . BACK SURGERY    . CARDIAC CATHETERIZATION    . CARDIOVERSION N/A 08/25/2018   Procedure: CARDIOVERSION;  Surgeon: Jerline Pain, MD;  Location: [facility] ENDOSCOPY;  Service: Cardiovascular;  Laterality: N/A;  . CHOLECYSTECTOMY    . CORONARY STENT INTERVENTION N/A 03/21/2018   Procedure: CORONARY STENT INTERVENTION;  Surgeon: Jettie Booze, MD;  Location: [facility] CV LAB;  Service: Cardiovascular;  Laterality: N/A;  . DILATION AND CURETTAGE OF UTERUS    .  HEMILAMINOTOMY LUMBAR SPINE  2002   Rt L5, with decompression and foraminotomy  . INDUCED ABORTION     , x2  . LAMINOTOMY Right   . LEFT HEART CATH AND CORONARY ANGIOGRAPHY N/A 03/21/2018   Procedure: LEFT HEART CATH AND CORONARY ANGIOGRAPHY;  Surgeon: Jettie Booze, MD;  Location: [facility] CV LAB;  Service: Cardiovascular;  Laterality: N/A;  . LUMBAR [place] SURGERY  07/1999   L5-S1  . PILONIDAL CYST EXCISION    . ROTATOR CUFF REPAIR    . TONSILLECTOMY    . TOTAL ABDOMINAL HYSTERECTOMY W/ BILATERAL SALPINGOOPHORECTOMY       OB History   No obstetric history on file.      Home Medications    Prior to Admission medications   Medication Sig  Start Date End Date Taking? Authorizing Provider  ALPRAZolam Duanne Moron) 0.25 MG tablet Take 0.25 mg by mouth daily as needed for anxiety.     [provider]  amLODipine (NORVASC) 5 MG tablet Take 1 tablet (5 mg total) by mouth daily. 03/23/18   Kroeger, Lorelee Cover., PA-C  antiseptic oral rinse (BIOTENE) LIQD 15 mLs by Mouth Rinse route as needed for dry mouth.    [provider]  apixaban (ELIQUIS) 5 MG TABS tablet Take 1 tablet (5 mg total) by mouth 2 (two) times daily. 08/04/18   Jerline Pain, MD  ASPERCREME LIDOCAINE EX Apply 1 application topically 2 (two) times daily as needed (back/feet pain).    [provider]  Carboxymethylcellulose Sod PF (RETAINE CMC) 0.5 % SOLN Place 1 drop into both eyes 2 (two) times daily as needed (dry eyes).    [provider]  clindamycin (CLEOCIN T) 1 % lotion Apply 1 application topically 2 (two) times daily.  11/16/13   [provider]  clopidogrel (PLAVIX) 75 MG tablet Take 1 tablet (75 mg total) by mouth daily. 03/23/18   Kroeger, Lorelee Cover., PA-C  cycloSPORINE (RESTASIS) 0.05 % ophthalmic emulsion Place 1 drop into both eyes 2 (two) times daily as needed (dry eyes).     [provider]  docusate sodium (COLACE) 100 MG capsule Take 100 mg by mouth daily.    [provider]  furosemide (LASIX) 40 MG tablet Take 40 mg by mouth daily.    [provider]  gabapentin (NEURONTIN) 100 MG capsule Take 100 mg by mouth 2 (two) times daily.    [provider]  isosorbide mononitrate (IMDUR) 30 MG 24 hr tablet TAKE 1 TABLET(30 MG) BY MOUTH DAILY 08/01/18   Kroeger, Daleen Snook M., PA-C  levothyroxine (SYNTHROID, LEVOTHROID) 75 MCG tablet Take 75 mcg by mouth daily.    [provider]  Melatonin 3 MG TABS Take 3 mg by mouth at bedtime as needed (sleep).    [provider]  metFORMIN (GLUCOPHAGE) 500 MG tablet Take 500 mg by mouth 2 (two) times daily with a meal.  03/17/16   [provider]  nitroGLYCERIN (NITROSTAT) 0.4 MG SL tablet Place 1 tablet (0.4 mg total) under the tongue every 5 (five) minutes x 3 doses as needed for chest pain. 03/22/18   Kroeger, Lorelee Cover., PA-C  Oxycodone HCl 10 MG TABS Take 1 tablet by mouth 4 (four) times daily. 05/22/15   [provider]  pantoprazole (PROTONIX) 40 MG tablet Take 1 tablet (40 mg total) by mouth daily. 03/23/18   Kroeger, Lorelee Cover., PA-C  PREMARIN vaginal cream Place 1 Applicatorful vaginally daily as needed (irritation).  10/06/13  [provider]  ramipril (ALTACE) 5 MG capsule TAKE 1 CAPSULE(5 MG) BY MOUTH DAILY 07/19/18   Kroeger, Daleen Snook M., PA-C  rosuvastatin (CRESTOR) 20 MG tablet Take 1 tablet (20 mg total) by mouth daily at 6 PM. 03/22/18   Kroeger, Lorelee Cover., PA-C  sodium chloride (OCEAN) 0.65 % SOLN nasal spray Place 1 spray into both nostrils as needed for congestion (dryness).    [provider]  valACYclovir (VALTREX) 500 MG tablet Take 500 mg by mouth 2 (two) times daily.     [provider]  vitamin B-12 (CYANOCOBALAMIN) 500 MCG tablet Take 500 mcg by mouth daily.    [provider]  zolpidem (AMBIEN) 10 MG tablet Take 10 mg by mouth at bedtime as needed for sleep.     [provider]    Family History Family History  Problem Relation Age of Onset  . Heart failure Father   . Diabetes Father   . Coronary artery disease Mother     Social History Social History   Tobacco Use  . Smoking status: Former Smoker    Years: 35.00  . Smokeless tobacco: Never Used  Substance Use Topics  . Alcohol use: No    Comment: social drinking, not often  . Drug use: No     Allergies   Pravastatin and Prednisone   Review of Systems Review of Systems  Constitutional: Positive for fatigue. Negative for diaphoresis (doesn't sweat) and fever.  HENT: Negative for sore throat.   Eyes: Negative for visual disturbance.  Respiratory: Positive for shortness of breath  (no different from baseline.). Negative for cough.   Cardiovascular: Positive for chest pain.  Gastrointestinal: Positive for nausea. Negative for abdominal pain, constipation, diarrhea and vomiting.  Genitourinary: Negative for difficulty urinating.  Musculoskeletal: Positive for arthralgias (chronic). Negative for back pain and neck pain.  Skin: Negative for rash.  Neurological: Negative for syncope and headaches.     Physical Exam Updated Vital Signs BP 135/60   Pulse 87   Temp 98.1 F (36.7 C) (Oral)   Resp 19   Wt 76.7 kg   SpO2 93%   BMI 34.13 kg/m   Physical Exam Vitals signs and nursing note reviewed.  Constitutional:      General: She is not in acute distress.    Appearance: She is well-developed. She is not diaphoretic.  HENT:     Head: Normocephalic and atraumatic.  Eyes:     Conjunctiva/sclera: Conjunctivae normal.  Neck:     Musculoskeletal: Normal range of motion.  Cardiovascular:     Rate and Rhythm: Normal rate and regular rhythm.     Heart sounds: Normal heart sounds. No murmur. No friction rub. No gallop.   Pulmonary:     Effort: Pulmonary effort is normal. No respiratory distress.     Breath sounds: Normal breath sounds. No wheezing or rales.  Abdominal:     General: There is no distension.     Palpations: Abdomen is soft.     Tenderness: There is no abdominal tenderness. There is no guarding.  Musculoskeletal:        General: No tenderness.  Skin:    General: Skin is warm and dry.     Findings: No erythema or rash.  Neurological:     Mental Status: She is alert and oriented to person, place, and time.      ED Treatments / Results  Labs (all labs ordered are listed, but only abnormal results are displayed) Labs Reviewed  CBC - Abnormal; Notable for the following components:      Result Value   RBC 3.78 (*)    Hemoglobin 11.0 (*)    RDW 17.1 (*)    All other components within normal limits  BASIC METABOLIC PANEL  TROPONIN I    EKG  EKG Interpretation  Date/Time:  Saturday October 29 2018 13:46:27 EDT Ventricular Rate:  74 PR Interval:    QRS Duration: 97 QT Interval:  374 QTC Calculation: 415 R Axis:   4 Text Interpretation:  Atrial fibrillation No significant change since last tracing Reconfirmed by Gareth Morgan [phone]) on 10/29/2018 4:10:05 PM   Radiology Dg Chest 2 View  Result Date: 10/29/2018 CLINICAL DATA:  Left-sided chest pain. EXAM: CHEST - 2 VIEW COMPARISON:  April 22, 2018. FINDINGS: The cardiomediastinal silhouette is stable. Mild interstitial prominence suggest pulmonary venous congestion. No other changes or acute abnormalities. IMPRESSION: Cardiomegaly and mild pulmonary venous congestion. Electronically Signed   By: Dorise Bullion III M.D   On: 10/29/2018 14:54    Procedures Procedures (including critical care time)  Medications Ordered in ED Medications  sodium chloride flush (NS) 0.9 % injection 3 mL (has no administration in time range)  oxyCODONE (Oxy IR/ROXICODONE) immediate release tablet 10 mg (has no administration in time range)  nitroGLYCERIN (NITROSTAT) SL tablet 0.4 mg (has no administration in time range)     Initial Impression / Assessment and Plan / ED Course  I have reviewed the triage vital signs and the nursing notes.  Pertinent labs & imaging results that were available during my care of the patient were reviewed by me and considered in my medical decision making (see chart for details).        85 year old female with a history of coronary artery disease with multiple stents placed, CKD, diabetes, hypertension, hyperlipidemia, atrial fibrillation, chronic pain, who presents with concern for left-sided chest pressure with radiation to the arm.  EKG was completed which shows atrial fibrillation without acute ST changes. CXR with mild pulmonary venous congestion, no pneumonia or pneumothorax.  No increased dyspnea, no asymmetric leg swelling, lower suspicion for PE,  and do not feel history is consistent with dissection.  Reports pain is the same as prior MI in quality, if not worse.  Pain has improved following nitroglycerin and is minimal at this time. Received ASA with EMS.  CBC without acute findings.  Hemolysis has led to delay in troponin or BMP results.  Given she is high risk and describing her typical anginal pain, I have consulted Cardiology. DDx includes angina or NSTEMI with troponin pending at this time.      Final Clinical Impressions(s) / ED Diagnoses   Final diagnoses:  Chest pain with high risk for cardiac etiology    ED Discharge Orders    None       Gareth Morgan, MD 10/29/18 1624

## 2018-10-29 NOTE — Plan of Care (Signed)
  Problem: Education: Goal: Ability to demonstrate management of disease process will improve Outcome: Progressing   Problem: Education: Goal: Ability to verbalize understanding of medication therapies will improve Outcome: Progressing   

## 2018-10-29 NOTE — ED Notes (Signed)
ED TO INPATIENT HANDOFF REPORT  ED Nurse Name and Phone #: Caprice Kluver [phone]  S Name/Age/Gender Kristine Oliver 85 y.o. female Room/Bed: 019C/019C  Code Status   Code Status: Prior  Home/SNF/Other Home Patient oriented to: self, place, time and situation Is this baseline? Yes   Triage Complete: Triage complete  Chief Complaint chest pain  Triage Note Pt in from home via [facility] with central crushing cp since 1100, states it happened at rest after eating today. Reports pain radiation L shoulder and back. Took 2 NTG and 324mg  ASA PTA, pain went from 9/10 to 6/10. Hx of Afib, stent placement, similar cp episode in August. Denies any n/v, sob or fevers   Allergies Allergies  Allergen Reactions  . Pravastatin Other (See Comments)    Muscle aches  . Prednisone Nausea And Vomiting    Level of Care/Admitting Diagnosis ED Disposition    ED Disposition Condition Comment   Admit  Hospital Area: [facility] [[id number]]  Level of Care: Telemetry Cardiac [103]  Diagnosis: Unstable angina [facility]) [[id number]]  Admitting Physician: Thompson Grayer [[id number]]  Attending Physician: Thompson Grayer [[id number]]  Estimated length of stay: past midnight tomorrow  Certification:: I certify this patient will need inpatient services for at least 2 midnights  PT Class (Do Not Modify): Inpatient [101]  PT Acc Code (Do Not Modify): Private [1]       B Medical/Surgery History Past Medical History:  Diagnosis Date  . Anxiety   . Bradycardia   . CAD (coronary artery disease)    a. 08/2004 NSTEMI/PCI: LAD 59m/d (2.75 x 28 mm Taxus 2 DES);  b. 10/2010 Cath: LM nl, LAD patent stents w/ jailed septal, LCX 30, RCA 30; c. 06/2013 MV: No ischemia. Small fixed anteroseptal defect, ? scar vs atten. EF 62%; d. 03/2016 MV: EF 57%, Low risk.  . Chronic diastolic heart failure ([place])    a. Echo 7/13:  mod LVH, EF 65-70%, vigorous LV, Gr 1 diast dysfn, mild LAE; b. 05/2017 Echo: EF 60-65%, mod LVH, no rwma, Gr1  DD, mildly dil LA.  Marland Kitchen Chronic pain disorder   . CKD (chronic kidney disease), stage III ([place])   . DDD (degenerative disc disease), lumbosacral    , Spinal stenosis  . Diabetes mellitus ([place])    , Type II  . GERD (gastroesophageal reflux disease)   . HTN (hypertension)   . Hyperlipidemia   . Hypothyroidism   . IBS (irritable bowel syndrome)   . Lumbar disc disease   . Nonproliferative diabetic retinopathy associated with type 2 diabetes mellitus ([place])   . Obesity (BMI 30-39.9)   . Peripheral neuropathy    , Associated with DM  . Scoliosis    Past Surgical History:  Procedure Laterality Date  . BACK SURGERY    . CARDIAC CATHETERIZATION    . CARDIOVERSION N/A 08/25/2018   Procedure: CARDIOVERSION;  Surgeon: Jerline Pain, MD;  Location: [facility] ENDOSCOPY;  Service: Cardiovascular;  Laterality: N/A;  . CHOLECYSTECTOMY    . CORONARY STENT INTERVENTION N/A 03/21/2018   Procedure: CORONARY STENT INTERVENTION;  Surgeon: Jettie Booze, MD;  Location: [facility] CV LAB;  Service: Cardiovascular;  Laterality: N/A;  . DILATION AND CURETTAGE OF UTERUS    . HEMILAMINOTOMY LUMBAR SPINE  2002   Rt L5, with decompression and foraminotomy  . INDUCED ABORTION     , x2  . LAMINOTOMY Right   . LEFT HEART CATH AND CORONARY ANGIOGRAPHY N/A 03/21/2018   Procedure: LEFT HEART  CATH AND CORONARY ANGIOGRAPHY;  Surgeon: Jettie Booze, MD;  Location: [facility] CV LAB;  Service: Cardiovascular;  Laterality: N/A;  . LUMBAR [place] SURGERY  07/1999   L5-S1  . PILONIDAL CYST EXCISION    . ROTATOR CUFF REPAIR    . TONSILLECTOMY    . TOTAL ABDOMINAL HYSTERECTOMY W/ BILATERAL SALPINGOOPHORECTOMY       A IV Location/Drains/Wounds Patient Lines/Drains/Airways Status   Active Line/Drains/Airways    Name:   Placement date:   Placement time:   Site:   Days:   Peripheral IV 10/29/18 Left Forearm   10/29/18    1317    Forearm   less than 1          Intake/Output Last 24 hours No intake or output  data in the 24 hours ending 10/29/18 1749  Labs/Imaging Results for orders placed or performed during the hospital encounter of 10/29/18 (from the past 48 hour(s))  CBC     Status: Abnormal   Collection Time: 10/29/18  1:55 PM  Result Value Ref Range   WBC 8.3 4.0 - 10.5 K/uL   RBC 3.78 (L) 3.87 - 5.11 MIL/uL   Hemoglobin 11.0 (L) 12.0 - 15.0 g/dL   HCT 36.4 36.0 - 46.0 %   MCV 96.3 80.0 - 100.0 fL   MCH 29.1 26.0 - 34.0 pg   MCHC 30.2 30.0 - 36.0 g/dL   RDW 17.1 (H) 11.5 - 15.5 %   Platelets 216 150 - 400 K/uL   nRBC 0.0 0.0 - 0.2 %    Comment: Performed at [facility], [place] [address] [place], [place] [address]  Basic metabolic panel     Status: Abnormal   Collection Time: 10/29/18  4:15 PM  Result Value Ref Range   Sodium 137 135 - 145 mmol/L   Potassium 4.2 3.5 - 5.1 mmol/L   Chloride 106 98 - 111 mmol/L   CO2 23 22 - 32 mmol/L   Glucose, Bld 128 (H) 70 - 99 mg/dL   BUN 22 8 - 23 mg/dL   Creatinine, Ser 1.47 (H) 0.44 - 1.00 mg/dL   Calcium 10.8 (H) 8.9 - 10.3 mg/dL   GFR calc non Af Amer 32 (L) >60 mL/min   GFR calc Af Amer 37 (L) >60 mL/min   Anion gap 8 5 - 15    Comment: Performed at [facility] [address], [place], [place] [address]  Troponin I -     Status: Abnormal   Collection Time: 10/29/18  4:15 PM  Result Value Ref Range   Troponin I 0.08 (HH) <0.03 ng/mL    Comment: CRITICAL RESULT CALLED TO, READ BACK BY AND VERIFIED WITH: Maebell Lyvers, J @ 1734 ON 10/29/2018 BY TEMOCHE, H Performed at [facility], [place] [address] [place], [place] [address]    Dg Chest 2 View  Result Date: 10/29/2018 CLINICAL DATA:  Left-sided chest pain. EXAM: CHEST - 2 VIEW COMPARISON:  April 22, 2018. FINDINGS: The cardiomediastinal silhouette is stable. Mild interstitial prominence suggest pulmonary venous congestion. No other changes or acute abnormalities. IMPRESSION: Cardiomegaly and mild pulmonary venous congestion. Electronically Signed   By: Dorise Bullion  III M.D   On: 10/29/2018 14:54    Pending Labs Unresulted Labs (From admission, onward)    Start     Ordered   10/29/18 1725  Platelet inhibition p2y12 (Not at [facility])  Once,   R     10/29/18 1724   10/29/18  1725  TSH  Once,   R     10/29/18 1724   Signed and Held  Comprehensive metabolic panel  Tomorrow morning,   R     Signed and Held   Signed and Held  Occult blood card to lab, stool  As needed,   R     Signed and Held   Signed and Held  Troponin I - Now Then Q6H  Now then every 6 hours,   STAT     Signed and Held   Signed and Held  Lipid panel  Tomorrow morning,   R     Signed and Held   Signed and Held  Hemoglobin A1c  Once,   R    Comments:  To assess prior glycemic control    Signed and Held          Vitals/Pain Today's Vitals   10/29/18 1351 10/29/18 1400 10/29/18 1515 10/29/18 1530  BP:  135/60 115/63 (!) 107/53  Pulse:  87 73 70  Resp:  19 14 19   Temp:      TempSrc:      SpO2:  93% 94% 96%  Weight: 76.7 kg     PainSc:   2      Isolation Precautions No active isolations  Medications Medications  sodium chloride flush (NS) 0.9 % injection 3 mL (has no administration in time range)  nitroGLYCERIN (NITROSTAT) SL tablet 0.4 mg (has no administration in time range)  ALPRAZolam (XANAX) tablet 0.25 mg (has no administration in time range)  amLODipine (NORVASC) tablet 5 mg (has no administration in time range)  Lidocaine 4 % PTCH 1 application (has no administration in time range)  Carboxymethylcellulose Sod PF 0.5 % SOLN 1 drop (has no administration in time range)  clindamycin (CLEOCIN T) 1 % lotion 1 application (has no administration in time range)  clopidogrel (PLAVIX) tablet 75 mg (has no administration in time range)  cycloSPORINE (RESTASIS) 0.05 % ophthalmic emulsion 1 drop (has no administration in time range)  docusate sodium (COLACE) capsule 100 mg (has no administration in time range)  furosemide (LASIX) tablet 40 mg (has no administration in time  range)  gabapentin (NEURONTIN) capsule 100 mg (has no administration in time range)  levothyroxine (SYNTHROID, LEVOTHROID) tablet 75 mcg (has no administration in time range)  Melatonin TABS 3 mg (has no administration in time range)  Oxycodone HCl TABS 10 mg (has no administration in time range)  pantoprazole (PROTONIX) EC tablet 40 mg (has no administration in time range)  ramipril (ALTACE) capsule 5 mg (has no administration in time range)  rosuvastatin (CRESTOR) tablet 20 mg (has no administration in time range)  sodium chloride (OCEAN) 0.65 % nasal spray 1 spray (has no administration in time range)  valACYclovir (VALTREX) tablet 500 mg (has no administration in time range)  vitamin B-12 (CYANOCOBALAMIN) tablet 500 mcg (has no administration in time range)  zolpidem (AMBIEN) tablet 5 mg (has no administration in time range)  oxyCODONE (Oxy IR/ROXICODONE) immediate release tablet 10 mg (10 mg Oral Given 10/29/18 1627)    Mobility walks Moderate fall risk   Focused Assessments Cardiac Assessment Handoff:  Cardiac Rhythm: Atrial fibrillation Lab Results  Component Value Date   CKTOTAL 87 02/13/2012   CKMB 4.2 (H) 02/13/2012   TROPONINI 0.08 (HH) 10/29/2018   Lab Results  Component Value Date   DDIMER 0.55 (H) 04/27/2018   Does the Patient currently have chest pain? No     R Recommendations: See Admitting  Provider Note  Report given to:   Additional Notes:

## 2018-10-29 NOTE — Progress Notes (Signed)
ANTICOAGULATION CONSULT NOTE - Initial Consult  Pharmacy Consult for heparin Indication: chest pain/ACS  Allergies  Allergen Reactions  . Pravastatin Other (See Comments)    Muscle aches  . Prednisone Nausea And Vomiting    Patient Measurements: Height: 4\' 11"  (149.9 cm) Weight: 167 lb 14.4 oz (76.2 kg) IBW/kg (Calculated) : 43.2 Heparin Dosing Weight: 61kg  Vital Signs: Temp: 98 F (36.7 C) (03/28 1843) Temp Source: Oral (03/28 1843) BP: 142/129 (03/28 1843) Pulse Rate: 84 (03/28 1843)  Labs: Recent Labs    10/29/18 1355 10/29/18 1615  HGB 11.0*  --   HCT 36.4  --   PLT 216  --   CREATININE  --  1.47*  TROPONINI  --  0.08*    Estimated Creatinine Clearance: 24.9 mL/min (A) (by C-G formula based on SCr of 1.47 mg/dL (H)).   Medical History: Past Medical History:  Diagnosis Date  . Anxiety   . Bradycardia   . CAD (coronary artery disease)    a. 08/2004 NSTEMI/PCI: LAD 23m/d (2.75 x 28 mm Taxus 2 DES);  b. 10/2010 Cath: LM nl, LAD patent stents w/ jailed septal, LCX 30, RCA 30; c. 06/2013 MV: No ischemia. Small fixed anteroseptal defect, ? scar vs atten. EF 62%; d. 03/2016 MV: EF 57%, Low risk.  . Chronic diastolic heart failure ([place])    a. Echo 7/13:  mod LVH, EF 65-70%, vigorous LV, Gr 1 diast dysfn, mild LAE; b. 05/2017 Echo: EF 60-65%, mod LVH, no rwma, Gr1 DD, mildly dil LA.  Marland Kitchen Chronic pain disorder   . CKD (chronic kidney disease), stage III ([place])   . DDD (degenerative disc disease), lumbosacral    , Spinal stenosis  . Diabetes mellitus ([place])    , Type II  . GERD (gastroesophageal reflux disease)   . HTN (hypertension)   . Hyperlipidemia   . Hypothyroidism   . IBS (irritable bowel syndrome)   . Lumbar disc disease   . Nonproliferative diabetic retinopathy associated with type 2 diabetes mellitus ([place])   . Obesity (BMI 30-39.9)   . Peripheral neuropathy    , Associated with DM  . Scoliosis     Assessment: 63 YOF on Eliquis PTA for afib now with  unstable angina, last dose 3/28 @1100 .  H/H low (chronic) stable, plts 216.    Goal of Therapy:  Heparin level 0.3-0.7 units/ml aPTT 66-102 seconds  Monitor platelets by anticoagulation protocol: Yes   Plan:  Heparin gtt at 700 units/hr 12 hours s/p last Eliquis dose, no bolus F/u 8 hour aPTT  Bertis Ruddy, PharmD Clinical Pharmacist Please check AMION for all [facility] numbers 10/29/2018 6:55 PM

## 2018-10-29 NOTE — ED Triage Notes (Signed)
Pt in from home via [facility] with central crushing cp since 1100, states it happened at rest after eating today. Reports pain radiation L shoulder and back. Took 2 NTG and 324mg  ASA PTA, pain went from 9/10 to 6/10. Hx of Afib, stent placement, similar cp episode in August. Denies any n/v, sob or fevers

## 2018-10-30 DIAGNOSIS — I5033 Acute on chronic diastolic (congestive) heart failure: Secondary | ICD-10-CM

## 2018-10-30 DIAGNOSIS — N179 Acute kidney failure, unspecified: Secondary | ICD-10-CM

## 2018-10-30 LAB — LIPID PANEL
Cholesterol: 123 mg/dL (ref 0–200)
HDL: 55 mg/dL (ref >40)
LDL Cholesterol: 47 mg/dL (ref 0–99)
Total CHOL/HDL Ratio: 2.2 RATIO
Triglycerides: 104 mg/dL (ref <150)
VLDL: 21 mg/dL (ref 0–40)

## 2018-10-30 LAB — TROPONIN I
TROPONIN I: 0.12 ng/mL — AB (ref <0.03)
Troponin I: 0.08 ng/mL (ref <0.03)
Troponin I: 0.08 ng/mL (ref <0.03)
Troponin I: 0.05 ng/mL (ref <0.03)

## 2018-10-30 LAB — COMPREHENSIVE METABOLIC PANEL
ALBUMIN: 3.7 g/dL (ref 3.5–5.0)
ALT: 15 U/L (ref 0–44)
AST: 21 U/L (ref 15–41)
Alkaline Phosphatase: 107 U/L (ref 38–126)
Anion gap: 10 (ref 5–15)
BUN: 26 mg/dL — ABNORMAL HIGH (ref 8–23)
CO2: 20 mmol/L — AB (ref 22–32)
Calcium: 10.9 mg/dL — ABNORMAL HIGH (ref 8.9–10.3)
Chloride: 108 mmol/L (ref 98–111)
Creatinine, Ser: 1.66 mg/dL — ABNORMAL HIGH (ref 0.44–1.00)
GFR calc Af Amer: 32 mL/min — ABNORMAL LOW (ref >60)
GFR calc non Af Amer: 28 mL/min — ABNORMAL LOW (ref >60)
Glucose, Bld: 102 mg/dL — ABNORMAL HIGH (ref 70–99)
Potassium: 4.6 mmol/L (ref 3.5–5.1)
Sodium: 138 mmol/L (ref 135–145)
Total Bilirubin: 0.6 mg/dL (ref 0.3–1.2)
Total Protein: 6.8 g/dL (ref 6.5–8.1)

## 2018-10-30 LAB — GLUCOSE, CAPILLARY
GLUCOSE-CAPILLARY: 156 mg/dL — AB (ref 70–99)
Glucose-Capillary: 109 mg/dL — ABNORMAL HIGH (ref 70–99)
Glucose-Capillary: 118 mg/dL — ABNORMAL HIGH (ref 70–99)
Glucose-Capillary: 151 mg/dL — ABNORMAL HIGH (ref 70–99)

## 2018-10-30 LAB — APTT: aPTT: 46 seconds — ABNORMAL HIGH (ref 24–36)

## 2018-10-30 LAB — HEPARIN LEVEL (UNFRACTIONATED): Heparin Unfractionated: 0.62 IU/mL (ref 0.30–0.70)

## 2018-10-30 MED ORDER — FUROSEMIDE 10 MG/ML IJ SOLN
20.0000 mg | Freq: Once | INTRAMUSCULAR | Status: DC
  Filled 2018-10-30: qty 2

## 2018-10-30 MED ORDER — FLEET ENEMA 7-19 GM/118ML RE ENEM
1.0000 | ENEMA | Freq: Once | RECTAL | Status: AC
  Administered 2018-10-30: 1 via RECTAL

## 2018-10-30 MED ORDER — OXYCODONE HCL 5 MG PO TABS
10.0000 mg | ORAL_TABLET | Freq: Four times a day (QID) | ORAL | Status: DC
  Administered 2018-10-30 – 2018-10-31 (×3): 10 mg via ORAL
  Filled 2018-10-30 (×3): qty 2

## 2018-10-30 MED ORDER — GLYCERIN (LAXATIVE) 2.1 G RE SUPP
2.0000 | Freq: Every day | RECTAL | Status: DC | PRN
  Administered 2018-10-30: 2 via RECTAL
  Filled 2018-10-30 (×2): qty 2

## 2018-10-30 MED ORDER — FLEET ENEMA 7-19 GM/118ML RE ENEM
2.0000 | ENEMA | Freq: Once | RECTAL | Status: DC
  Filled 2018-10-30: qty 2

## 2018-10-30 MED ORDER — APIXABAN 2.5 MG PO TABS
2.5000 mg | ORAL_TABLET | Freq: Two times a day (BID) | ORAL | Status: DC
  Administered 2018-10-30 – 2018-10-31 (×3): 2.5 mg via ORAL
  Filled 2018-10-30 (×3): qty 1

## 2018-10-30 MED ORDER — FUROSEMIDE 10 MG/ML IJ SOLN
40.0000 mg | Freq: Two times a day (BID) | INTRAMUSCULAR | Status: AC
  Administered 2018-10-30 (×2): 40 mg via INTRAVENOUS
  Filled 2018-10-30: qty 4

## 2018-10-30 NOTE — Progress Notes (Signed)
Patient refusing bed alarm.  Patient told to call when needing to get up.  Patient also refusing to wear yellow socks.

## 2018-10-30 NOTE — Progress Notes (Signed)
ANTICOAGULATION CONSULT NOTE - [place] for heparin Indication: chest pain/ACS  Allergies  Allergen Reactions  . Pravastatin Other (See Comments)    Muscle aches  . Prednisone Nausea And Vomiting    Patient Measurements: Height: 4\' 11"  (149.9 cm) Weight: 168 lb 14.4 oz (76.6 kg) IBW/kg (Calculated) : 43.2 Heparin Dosing Weight: 61kg  Vital Signs: Temp: 97.3 F (36.3 C) (03/29 0442) Temp Source: Oral (03/29 0442) BP: 134/66 (03/29 0442) Pulse Rate: 63 (03/29 0442)  Labs: Recent Labs    10/29/18 1355 10/29/18 1615 10/29/18 2320 10/30/18 0330 10/30/18 0639  HGB 11.0*  --   --   --   --   HCT 36.4  --   --   --   --   PLT 216  --   --   --   --   APTT  --   --   --   --  46*  HEPARINUNFRC  --   --   --   --  0.62  CREATININE  --  1.47*  --  1.66*  --   TROPONINI  --  0.08* 0.12* 0.08*  --     Estimated Creatinine Clearance: 22.1 mL/min (A) (by C-G formula based on SCr of 1.66 mg/dL (H)).   Medical History: Past Medical History:  Diagnosis Date  . Anxiety   . Bradycardia   . CAD (coronary artery disease)    a. 08/2004 NSTEMI/PCI: LAD 77m/d (2.75 x 28 mm Taxus 2 DES);  b. 10/2010 Cath: LM nl, LAD patent stents w/ jailed septal, LCX 30, RCA 30; c. 06/2013 MV: No ischemia. Small fixed anteroseptal defect, ? scar vs atten. EF 62%; d. 03/2016 MV: EF 57%, Low risk.  . Chronic diastolic heart failure ([place])    a. Echo 7/13:  mod LVH, EF 65-70%, vigorous LV, Gr 1 diast dysfn, mild LAE; b. 05/2017 Echo: EF 60-65%, mod LVH, no rwma, Gr1 DD, mildly dil LA.  Marland Kitchen Chronic pain disorder   . CKD (chronic kidney disease), stage III ([place])   . DDD (degenerative disc disease), lumbosacral    , Spinal stenosis  . Diabetes mellitus ([place])    , Type II  . GERD (gastroesophageal reflux disease)   . HTN (hypertension)   . Hyperlipidemia   . Hypothyroidism   . IBS (irritable bowel syndrome)   . Lumbar disc disease   . Nonproliferative diabetic retinopathy  associated with type 2 diabetes mellitus ([place])   . Obesity (BMI 30-39.9)   . Peripheral neuropathy    , Associated with DM  . Scoliosis     Assessment: 65 YOF on Eliquis PTA for afib now with unstable angina, last dose 3/28 @1100 .   Heparin level 0.62 and aPTT is subtherapeutic at 46. These values are not yet correlated (heaprin level should be lower). Troponins down to 0.08 from 0.12 yesterday evening. No CBC this AM, but no bleeding or infusion issues per nursing.   Goal of Therapy:  Heparin level 0.3-0.7 units/ml aPTT 66-102 seconds  Monitor platelets by anticoagulation protocol: Yes   Plan:  Increase heparin gtt to 850 units/hr, no bolus Recheck aPTT and HL in 8 hours Monitor daily CBC  Jackson Latino, PharmD PGY1 Pharmacy Resident Phone [phone] 10/30/2018     8:01 AM

## 2018-10-30 NOTE — Progress Notes (Signed)
Patient is very upset and tearful about family not being about to be at hospital and stated that they did not want to be close to the hospital and was crying uncontrollable and very irritable. Currently having a bowel movement in the bathroom after a fleets enema. IV lasix given and plan to reevaluate for discharge in the morning.

## 2018-10-30 NOTE — Progress Notes (Signed)
Patient takes 2 fleet enema per home regimen. Chronic constipation.

## 2018-10-30 NOTE — Progress Notes (Signed)
Progress Note  Patient Name: Kristine Oliver Date of Encounter: 10/30/2018  Primary Cardiologist: [name], MD   Subjective   The patient denies any ongoing chest pain or shortness of breath.  Inpatient Medications    Scheduled Meds: . amLODipine  2.5 mg Oral Daily  . clopidogrel  75 mg Oral Daily  . docusate sodium  100 mg Oral Daily  . furosemide  20 mg Oral Daily  . gabapentin  100 mg Oral BID  . insulin aspart  0-5 Units Subcutaneous QHS  . insulin aspart  0-9 Units Subcutaneous TID WC  . levothyroxine  75 mcg Oral Daily  . oxyCODONE  10 mg Oral QID  . pantoprazole  40 mg Oral Daily  . ramipril  5 mg Oral Daily  . rosuvastatin  20 mg Oral q1800  . sodium chloride flush  3 mL Intravenous Once  . sodium chloride flush  3 mL Intravenous Q12H  . valACYclovir  500 mg Oral BID  . vitamin B-12  500 mcg Oral Daily   Continuous Infusions: . sodium chloride    . heparin 850 Units/hr (10/30/18 0813)   PRN Meds: sodium chloride, acetaminophen, ALPRAZolam, cycloSPORINE, lidocaine, Melatonin, nitroGLYCERIN, ondansetron (ZOFRAN) IV, polyvinyl alcohol, sodium chloride, sodium chloride flush, zolpidem   Vital Signs    Vitals:   10/29/18 1845 10/29/18 2046 10/30/18 0006 10/30/18 0442  BP:  (!) 121/98 126/65 134/66  Pulse:  79 97 63  Resp:  20 16 16   Temp:  97.7 F (36.5 C) 98.2 F (36.8 C) (!) 97.3 F (36.3 C)  TempSrc:  Oral Oral Oral  SpO2:  94% 95% 99%  Weight: 76.2 kg   76.6 kg  Height: 4\' 11"  (1.499 m)       Intake/Output Summary (Last 24 hours) at 10/30/2018 0938 Last data filed at 10/30/2018 1856 Gross per 24 hour  Intake 254.96 ml  Output 625 ml  Net -370.04 ml   Last 3 Weights 10/30/2018 10/29/2018 10/29/2018  Weight (lbs) 168 lb 14.4 oz 167 lb 14.4 oz 169 lb  Weight (kg) 76.613 kg 76.159 kg 76.658 kg      Telemetry    A-fib, rate controlled - Personally Reviewed  ECG    A-fib, non-specific ST T waves abnormalities - Personally Reviewed  Physical  Exam   GEN: No acute distress.   Neck: No JVD Cardiac: RRR, no murmurs, rubs, or gallops.  Respiratory:minimal crackles to auscultation bilaterally. GI: Soft, nontender, non-distended  MS: No edema; No deformity. Neuro:  Nonfocal  Psych: Normal affect   Labs    Chemistry Recent Labs  Lab 10/29/18 1615 10/30/18 0330  NA 137 138  K 4.2 4.6  CL 106 108  CO2 23 20*  GLUCOSE 128* 102*  BUN 22 26*  CREATININE 1.47* 1.66*  CALCIUM 10.8* 10.9*  PROT  --  6.8  ALBUMIN  --  3.7  AST  --  21  ALT  --  15  ALKPHOS  --  107  BILITOT  --  0.6  GFRNONAA 32* 28*  GFRAA 37* 32*  ANIONGAP 8 10     Hematology Recent Labs  Lab 10/29/18 1355  WBC 8.3  RBC 3.78*  HGB 11.0*  HCT 36.4  MCV 96.3  MCH 29.1  MCHC 30.2  RDW 17.1*  PLT 216    Cardiac Enzymes Recent Labs  Lab 10/29/18 1615 10/29/18 2320 10/30/18 0330  TROPONINI 0.08* 0.12* 0.08*   No results for input(s): TROPIPOC in the last  168 hours.   BNPNo results for input(s): BNP, PROBNP in the last 168 hours.   DDimer No results for input(s): DDIMER in the last 168 hours.   Radiology    Dg Chest 2 View  Result Date: 10/29/2018 CLINICAL DATA:  Left-sided chest pain. EXAM: CHEST - 2 VIEW COMPARISON:  April 22, 2018. FINDINGS: The cardiomediastinal silhouette is stable. Mild interstitial prominence suggest pulmonary venous congestion. No other changes or acute abnormalities. IMPRESSION: Cardiomegaly and mild pulmonary venous congestion. Electronically Signed   By: Dorise Bullion III M.D   On: 10/29/2018 14:54    Cardiac Studies   TTE: 03/19/2018  Left ventricle: The cavity size was normal. There was mild   concentric hypertrophy. Systolic function was normal. The   estimated ejection fraction was in the range of 60% to 65%. Wall   motion was normal; there were no regional wall motion   abnormalities. Doppler parameters are consistent with abnormal   left ventricular relaxation (grade 1 diastolic  dysfunction).   Doppler parameters are consistent with elevated ventricular   end-diastolic filling pressure. - Aortic valve: There was trivial regurgitation. - Mitral valve: There was mild regurgitation. - Left atrium: The atrium was mildly dilated. - Right ventricle: Systolic function was normal. - Tricuspid valve: There was mild regurgitation. - Pulmonic valve: There was no regurgitation. - Pulmonary arteries: Systolic pressure was within the normal   range. - Inferior vena cava: The vessel was normal in size. - Pericardium, extracardiac: There was no pericardial effusion.    Patient Profile     85 y.o. female with a hx of NSTEMI 2006 w/ DES LAD, last cath 03/2018 w/ DES CFX, D-CHF, CKD III, DM, HTN, HLD, hypothyroid, obesity, Afib dx 07/2018, who is being seen today for the evaluation of chest pain and elevated troponin.  Assessment & Plan    1.  Demand ischemia - troponin minimally elevated 0.08->0.12->0.08 - BNP 2300, CXR with CHF, this is most probably demand ischemia in the settings of AKI and CHF - discontinue heparin, restart eliquis -Has had muscle aches with Pravachol, continue Crestor 20 mg a day.  2. Acute on chronic diastolic CHF - BNP 4920 - CXR with vascular congestion  - start lasix 40 mg iv BID x 2 doses - if needed to be discharged in the settings of covid 19 infection, send home on lasix 40 mg po daily and arrange for outpatient labs in 1 week - follow [facility] closely - she was educated about CHF diet, she admits to eating a lot of potato chips lately  3. AKI - as above  4.  Type 2 diabetes mellitus with vascular disease (HCC) -Hold metformin and use sliding scale insulin  5.  Persistent atrial fibrillation -Not on anything for rate control, there was concern that she would need a pacemaker at some point. - No history of presyncope or syncope, heart rate is normal on telemetry -Follow on telemetry - restarted Eliquis 2.5 mg po BID, previously on 5 mg  po BID but now GFR 28  6. Opioid dependence in an elderly - this is very concerning as she chronically takes high doses of opioids and has high drug seeking behavior  For questions or updates, please contact [place] Please consult [email or web address] for contact info under        Signed, Ena Dawley, MD  10/30/2018, 9:38 AM

## 2018-10-30 NOTE — Progress Notes (Signed)
Patient refusing socks and staff stand by assist at shift change.

## 2018-10-31 ENCOUNTER — Telehealth: Payer: Self-pay | Admitting: Cardiology

## 2018-10-31 DIAGNOSIS — I248 Other forms of acute ischemic heart disease: Secondary | ICD-10-CM

## 2018-10-31 LAB — CBC
HCT: 36 % (ref 36.0–46.0)
Hemoglobin: 11.4 g/dL — ABNORMAL LOW (ref 12.0–15.0)
MCH: 29.3 pg (ref 26.0–34.0)
MCHC: 31.7 g/dL (ref 30.0–36.0)
MCV: 92.5 fL (ref 80.0–100.0)
NRBC: 0 % (ref 0.0–0.2)
Platelets: 209 10*3/uL (ref 150–400)
RBC: 3.89 MIL/uL (ref 3.87–5.11)
RDW: 16.6 % — ABNORMAL HIGH (ref 11.5–15.5)
WBC: 8 10*3/uL (ref 4.0–10.5)

## 2018-10-31 LAB — GLUCOSE, CAPILLARY
Glucose-Capillary: 107 mg/dL — ABNORMAL HIGH (ref 70–99)
Glucose-Capillary: 153 mg/dL — ABNORMAL HIGH (ref 70–99)

## 2018-10-31 MED ORDER — FUROSEMIDE 40 MG PO TABS: 40.0000 mg | ORAL_TABLET | Freq: Every day | ORAL | 3 refills | Status: DC

## 2018-10-31 MED ORDER — OXYCODONE HCL 5 MG PO TABS
5.0000 mg | ORAL_TABLET | Freq: Once | ORAL | Status: AC
  Administered 2018-10-31: 5 mg via ORAL
  Filled 2018-10-31: qty 1

## 2018-10-31 MED ORDER — APIXABAN 2.5 MG PO TABS: 2.5000 mg | ORAL_TABLET | Freq: Two times a day (BID) | ORAL | 3 refills | Status: AC

## 2018-10-31 NOTE — Telephone Encounter (Signed)
TOC Patient   Please call Patient   Ptt has an appt with Cecilie Kicks on 11-10-18

## 2018-10-31 NOTE — Discharge Summary (Signed)
Discharge Summary    Patient ID: Kristine Oliver MRN: [id number]; DOB: 1931-01-02  Admit date: 10/29/2018 Discharge date: 10/31/2018  Primary Care Provider: Lavone Orn, MD  Primary Cardiologist: [name], MD Primary Electrophysiologist:  None   Discharge Diagnoses    Principal Problem:   Unstable angina [facility]) Active Problems:   Type 2 diabetes mellitus with vascular disease ([place])   Acute on chronic diastolic heart failure ([place])   Persistent atrial fibrillation   AKI (acute kidney injury) ([place])   Allergies Allergies  Allergen Reactions  . Pravastatin Other (See Comments)    Muscle aches  . Prednisone Nausea And Vomiting    Diagnostic Studies/Procedures    CATH: 03/21/2018:  Prox RCA to Mid RCA lesion is 25% stenosed.  Mid Cx lesion is 90% stenosed.  A drug-eluting stent was successfully placed using a STENT SIERRA 2.75 X 28 MM.  Post intervention, there is a 0% residual stenosis.  Ost Cx to Prox Cx lesion is 50% stenosed.  Previously placed Mid LAD stent (unknown type) is widely patent.  Prox LAD lesion is 25% stenosed.  LV end diastolic pressure is mildly elevated. LVEDP 17 mm Hg.  There is no aortic valve stenosis.  Vasospasm and discomfort in the right arm from the radial approach. Recommend uninterrupted dual antiplatelet therapy with Aspirin 81mg  daily and Clopidogrel 75mg  daily for a minimum of 12 months (ACS - Class I recommendation). Patient was already on Plavix. COuld consdier Brilinta instead if bleeding risk is low.   TTE: 03/19/2018  Left ventricle: The cavity size was normal. There was mild concentric hypertrophy. Systolic function was normal. The estimated ejection fraction was in the range of 60% to 65%. Wall motion was normal; there were no regional wall motion abnormalities. Doppler parameters are consistent with abnormal left ventricular relaxation (grade 1 diastolic dysfunction). Doppler parameters are consistent  with elevated ventricular end-diastolic filling pressure. - Aortic valve: There was trivial regurgitation. - Mitral valve: There was mild regurgitation. - Left atrium: The atrium was mildly dilated. - Right ventricle: Systolic function was normal. - Tricuspid valve: There was mild regurgitation. - Pulmonic valve: There was no regurgitation. - Pulmonary arteries: Systolic pressure was within the normal range. - Inferior vena cava: The vessel was normal in size. - Pericardium, extracardiac: There was no pericardial effusion.   History of Present Illness     Kristine Oliver is a 85 y.o. female with a hx of NSTEMI 2006 w/ DES LAD, last cath 03/2018 w/ DES CFX, D-CHF, CKD III, DM, HTN, HLD, hypothyroid, obesity, Afib dx 07/2018, who presented to [facility] with chest pain.  Kristine Oliver was seen by Dr Marlou Porch 09/21/2018, considered to have longstanding persistent atrial fibrillation.  She complained of fatigue and bruising, but she had a DES circumflex 03/2018 so needs to stay on Plavix and her CHA2DS2-VASc = 7 (age x 2, female, CAD, HTN, DM, CHF), so needs to continue Eliquis.  She has a bad back, that limits her mobility. She has a rollator and walks in the park with that. She did that 2 days ago, no problems. She has not had any bad feelings from her heart. No recent chest pain. She had some about a month ago, took 2 nitro and it resolved.   On 10/29/18, she took her pills and stood up to go take a shower. She had sudden onset of chest pressure>>L arm and up her neck. She felt the pain was very deep and covered a larger area than usual. The  pain was 8-9/10, her usual angina, but a little worse. The pain made her SOB and a little nauseated. No vomiting or diaphoresis.   She took SL NTG x 2, they helped a little, not very much. She called EMS. They gave her ASA 324 mg and SL NTG x 2. By arrival at the ER, her CP was still 7-8/10. She got oxy and her CP is down to a 2/10.   Chronic L shoulder pain  since rotator cuff surgery, so that still hurts.   Does not feel her heart skip or race.   Occ SOB, no orthopnea or PND. Has DOE chronically, no recent change. Feels better when she stays active.   Hospital Course     Consultants: none  Chest pian, mild troponin elevation Patient was admitted to cardiology service and enzymes cycled and remained mild and flat: 0.08 --> 0.12 --> 0.08. This was felt to be due to demand ischemia in the setting of CHF and AKI. Her chest pain resolved. Plavix continued for recent stent.   Acute on chronic diastolic heart failure BNP on admission 2300 and CXR with vascular congestion. She was diuresed with 2 doses of IV lasix 40 mg. Weight is 160 lbs from 169 lbs. She has been eating too many potato chips (per the patient). sCr 1.66, K is 4.6. Transitioned lasix to 40 mg PO daily, which is increased from her previous home dose of 20 mg daily. Continue ACEI at this time.  Long-standing persistent Afib Rates controlled. Heart rates in the 70s. Eliquis was decreased to 2.5 mg BID given her creatinine and age. Recheck BMP in 2 weeks.  AKI sCr 1.66, baseline appears to be 1.16-1.26, but was 1.5 2-3 months ago. Continue ACEI for now. Recheck BMP in 2 weeks.  Hypertension Continue ramipril. Norvasc was reduced to 2.5 mg. Will hold imdur at discharge with increased dose of lasix for pressure. At follow up, consider adding back imdur 30 mg if pressure tolerates.    Patient seen and examined by Dr. Marlou Porch and felt to be stable for discharge. She is setup for a telehealth visit for a TOC and has been instructed to repeat lab work in 2 weeks.   _____________  Discharge Vitals Blood pressure 120/61, pulse 70, temperature 97.7 F (36.5 C), temperature source Oral, resp. rate 17, height 4\' 11"  (1.499 m), weight 72.6 kg, SpO2 95 %.  Filed Weights   10/29/18 1845 10/30/18 0442 10/31/18 0025  Weight: 76.2 kg 76.6 kg 72.6 kg    Labs & Radiologic Studies    CBC  Recent Labs    10/29/18 1355 10/31/18 0400  WBC 8.3 8.0  HGB 11.0* 11.4*  HCT 36.4 36.0  MCV 96.3 92.5  PLT 216 662   Basic Metabolic Panel Recent Labs    10/29/18 1615 10/30/18 0330  NA 137 138  K 4.2 4.6  CL 106 108  CO2 23 20*  GLUCOSE 128* 102*  BUN 22 26*  CREATININE 1.47* 1.66*  CALCIUM 10.8* 10.9*   Liver Function Tests Recent Labs    10/30/18 0330  AST 21  ALT 15  ALKPHOS 107  BILITOT 0.6  PROT 6.8  ALBUMIN 3.7   No results for input(s): LIPASE, AMYLASE in the last 72 hours. Cardiac Enzymes Recent Labs    10/29/18 2320 10/30/18 0330 10/30/18 1008  TROPONINI 0.12* 0.08* 0.05*   BNP Invalid input(s): POCBNP D-Dimer No results for input(s): DDIMER in the last 72 hours. Hemoglobin A1C Recent Labs  10/29/18 1926  HGBA1C 7.2*   Fasting Lipid Panel Recent Labs    10/30/18 0330  CHOL 123  HDL 55  LDLCALC 47  TRIG 104  CHOLHDL 2.2   Thyroid Function Tests Recent Labs    10/29/18 1926  TSH 1.164   _____________  Dg Chest 2 View  Result Date: 10/29/2018 CLINICAL DATA:  Left-sided chest pain. EXAM: CHEST - 2 VIEW COMPARISON:  April 22, 2018. FINDINGS: The cardiomediastinal silhouette is stable. Mild interstitial prominence suggest pulmonary venous congestion. No other changes or acute abnormalities. IMPRESSION: Cardiomegaly and mild pulmonary venous congestion. Electronically Signed   By: Dorise Bullion III M.D   On: 10/29/2018 14:54   Disposition   Pt is being discharged home today in good condition.  Follow-up Plans & Appointments    Follow-up Information    [facility] Follow up on 11/14/2018.   Specialty:  Cardiology Why:  BMP at lab Contact information: [address] [place]       Isaiah Serge, NP Follow up on 11/10/2018.   Specialties:  Cardiology, Radiology Why:  TOC at 9:00 am Contact information: [place] [address] [phone]          Discharge Instructions    Diet - low sodium heart healthy   Complete by:  As directed    Diet - low sodium heart healthy   Complete by:  As directed    Discharge instructions   Complete by:  As directed    Follow up appt will be a telehealth visit. It would be helpful to have a smartphone available on that day. Please report for lab draw in two weeks.   Discharge instructions   Complete by:  As directed    Your follow up appt will be a telehealth visit. Having access to a smartphone on that day will be helpful, but not necessary. Please have your labs drawn at [facility] in 2 weeks.   Increase activity slowly   Complete by:  As directed    Increase activity slowly   Complete by:  As directed       Discharge Medications   Allergies as of 10/31/2018      Reactions   Pravastatin Other (See Comments)   Muscle aches   Prednisone Nausea And Vomiting      Medication List    STOP taking these medications   ibuprofen 200 MG tablet Commonly known as:  ADVIL,MOTRIN   isosorbide mononitrate 30 MG 24 hr tablet Commonly known as:  IMDUR     TAKE these medications   amLODipine 2.5 MG tablet Commonly known as:  NORVASC Take 2.5 mg by mouth daily. What changed:  Another medication with the same name was removed. Continue taking this medication, and follow the directions you see here.   antiseptic oral rinse Liqd 15 mLs by Mouth Rinse route 2 (two) times daily.   apixaban 2.5 MG Tabs tablet Commonly known as:  ELIQUIS Take 1 tablet (2.5 mg total) by mouth 2 (two) times daily. What changed:    medication strength  how much to take   ASPERCREME LIDOCAINE EX Apply 1 application topically 2 (two) times daily as needed (back/feet pain).   clindamycin 1 % lotion Commonly known as:  CLEOCIN T Apply 1 application topically 2 (two) times daily. Facial wash   clopidogrel 75 MG tablet Commonly known as:  PLAVIX Take 1 tablet (75 mg  total) by mouth daily.   cycloSPORINE 0.05 % ophthalmic emulsion Commonly known as:  RESTASIS Place 1 drop into both eyes 2 (two) times daily as needed (dry eyes).   docusate sodium 100 MG capsule Commonly known as:  COLACE Take 100 mg by mouth daily.   furosemide 40 MG tablet Commonly known as:  LASIX Take 1 tablet (40 mg total) by mouth daily. What changed:    medication strength  how much to take   gabapentin 300 MG capsule Commonly known as:  NEURONTIN Take 300 mg by mouth 2 (two) times daily.   levothyroxine 75 MCG tablet Commonly known as:  SYNTHROID, LEVOTHROID Take 75 mcg by mouth daily.   Melatonin 3 MG Tabs Take 3 mg by mouth at bedtime as needed (sleep).   metFORMIN 500 MG tablet Commonly known as:  GLUCOPHAGE Take 500 mg by mouth 2 (two) times daily with a meal.   nitroGLYCERIN 0.4 MG SL tablet Commonly known as:  NITROSTAT Place 1 tablet (0.4 mg total) under the tongue every 5 (five) minutes x 3 doses as needed for chest pain.   Oxycodone HCl 10 MG Tabs Take 1 tablet by mouth 4 (four) times daily.   oxyCODONE 5 MG immediate release tablet Commonly known as:  Oxy IR/ROXICODONE Take 5 mg by mouth at bedtime.   pantoprazole 40 MG tablet Commonly known as:  PROTONIX Take 1 tablet (40 mg total) by mouth daily.   Premarin vaginal cream Generic drug:  conjugated estrogens Place 1 Applicatorful vaginally 2 (two) times a week.   ramipril 5 MG capsule Commonly known as:  ALTACE TAKE 1 CAPSULE(5 MG) BY MOUTH DAILY What changed:  See the new instructions.   Retaine CMC 0.5 % Soln Generic drug:  Carboxymethylcellulose Sod PF Place 1 drop into both eyes daily.   rosuvastatin 20 MG tablet Commonly known as:  CRESTOR Take 1 tablet (20 mg total) by mouth daily at 6 PM.   sodium chloride 0.65 % Soln nasal spray Commonly known as:  OCEAN Place 1 spray into both nostrils as needed for congestion (dryness).   valACYclovir 500 MG tablet Commonly known as:   VALTREX Take 500 mg by mouth 2 (two) times daily.   vitamin B-12 500 MCG tablet Commonly known as:  CYANOCOBALAMIN Take 500 mcg by mouth daily.        Acute coronary syndrome (MI, NSTEMI, STEMI, etc) this admission?:  No.  The elevated Troponin was due to the acute medical illness or demand ischemia.    Outstanding Labs/Studies   BMP in 2 weeks  TOC telehealth visit in 7-14 days   Duration of Discharge Encounter   Greater than 30 minutes including physician time.  Signed, Tami Lin Duke, PA 10/31/2018, 3:17 PM

## 2018-10-31 NOTE — Discharge Instructions (Signed)
Information on my medicine - ELIQUIS (apixaban)  Why was Eliquis prescribed for you? Eliquis was prescribed for you to reduce the risk of a blood clot forming that can cause a stroke if you have a medical condition called atrial fibrillation (a type of irregular heartbeat).  What do You need to know about Eliquis ? Take your Eliquis TWICE DAILY - one tablet in the morning and one tablet in the evening with or without food. If you have difficulty swallowing the tablet whole please discuss with your pharmacist how to take the medication safely.  Take Eliquis exactly as prescribed by your doctor and DO NOT stop taking Eliquis without talking to the doctor who prescribed the medication.  Stopping may increase your risk of developing a stroke.  Refill your prescription before you run out.  After discharge, you should have regular check-up appointments with your healthcare provider that is prescribing your Eliquis.  In the future your dose may need to be changed if your kidney function or weight changes by a significant amount or as you get older.  What do you do if you miss a dose? If you miss a dose, take it as soon as you remember on the same day and resume taking twice daily.  Do not take more than one dose of ELIQUIS at the same time to make up a missed dose.  Important Safety Information A possible side effect of Eliquis is bleeding. You should call your healthcare provider right away if you experience any of the following: ? Bleeding from an injury or your nose that does not stop. ? Unusual colored urine (red or dark brown) or unusual colored stools (red or black). ? Unusual bruising for unknown reasons. ? A serious fall or if you hit your head (even if there is no bleeding).  Some medicines may interact with Eliquis and might increase your risk of bleeding or clotting while on Eliquis. To help avoid this, consult your healthcare provider or pharmacist prior to using any new  prescription or non-prescription medications, including herbals, vitamins, non-steroidal anti-inflammatory drugs (NSAIDs) and supplements.  This website has more information on Eliquis (apixaban): http://[email or web address].com/eliquis/home  ___________________________________________________________________________________________  YOUR CARDIOLOGY TEAM HAS ARRANGED FOR AN E-VISIT FOR YOUR APPOINTMENT - PLEASE REVIEW IMPORTANT INFORMATION BELOW SEVERAL DAYS PRIOR TO YOUR APPOINTMENT  Due to the recent COVID-19 pandemic, we are transitioning in-person office visits to tele-medicine visits in an effort to decrease unnecessary exposure to our patients and staff. Medicare and most insurances are covering these visits without a copay needed. You will need a smartphone if possible. We also encourage you to sign up for MyChart. For patients that do not have this, we can still complete the visit using a regular telephone but do prefer a smartphone to enable video when possible. You may have a close family member that can help. If possible, we also ask that you have a blood pressure cuff and scale at home to measure your blood pressure, heart rate and weight prior to your scheduled appointment. Patients with clinical needs that need an in-person evaluation and testing will still be able to come to the office if absolutely necessary. If you have any questions, feel free to call our office.    IF YOU HAVE A SMARTPHONE, PLEASE DOWNLOAD THE WEBEX APP TO YOUR SMARTPHONE  - If Apple, go to [employer] and type in WebEx in the search bar. [place] [employer], the blue/green circle. The app is free but as with any other  app download, your phone may require you to verify saved payment information or Apple password. You do NOT have to create a WebEx account.  - If Android, go to [employer] and type in [employer] in the search bar. [place] [employer], the blue/green circle. The app is free but as with any  other app download, your phone may require you to verify saved payment information or Android password. You do NOT have to create a WebEx account.  It is very helpful to have this downloaded before your visit.    2-3 DAYS BEFORE YOUR APPOINTMENT  You will receive a telephone call from one of our [place] team members - your caller ID may say "Unknown caller." If this is a video visit, we will confirm that you have been able to download the WebEx app. We will remind you check your blood pressure, heart rate and weight prior to your scheduled appointment. If you have an Apple Watch or Kardia, please upload any pertinent ECG strips the day before or morning of your appointment to [place]. Our staff will also make sure you have reviewed the consent and agree to move forward with your scheduled tele-health visit.     THE DAY OF YOUR APPOINTMENT  Approximately 15 minutes prior to your scheduled appointment, you will receive a telephone call from one of [place] team - your caller ID may say "Unknown caller."  Our staff will confirm medications, vital signs for the day and any symptoms you may be experiencing. Please have this information available prior to the time of visit start. It may also be helpful for you to have a pad of paper and pen handy for any instructions given during your visit. They will also walk you through joining the WebEx smartphone meeting if this is a video visit.    CONSENT FOR TELE-HEALTH VISIT - PLEASE RVIEW  I hereby voluntarily request, consent and authorize [facility] and its employed or contracted physicians, physician assistants, nurse practitioners or other licensed health care professionals (the Practitioner), to provide me with telemedicine health care services (the Services") as deemed necessary by the treating Practitioner. I acknowledge and consent to receive the Services by the Practitioner via telemedicine. I understand that the telemedicine visit will  involve communicating with the Practitioner through live audiovisual communication technology and the disclosure of certain medical information by electronic transmission. I acknowledge that I have been given the opportunity to request an in-person assessment or other available alternative prior to the telemedicine visit and am voluntarily participating in the telemedicine visit.  I understand that I have the right to withhold or withdraw my consent to the use of telemedicine in the course of my care at any time, without affecting my right to future care or treatment, and that the Practitioner or I may terminate the telemedicine visit at any time. I understand that I have the right to inspect all information obtained and/or recorded in the course of the telemedicine visit and may receive copies of available information for a reasonable fee.  I understand that some of the potential risks of receiving the Services via telemedicine include:   Delay or interruption in medical evaluation due to technological equipment failure or disruption;  Information transmitted may not be sufficient (e.g. poor resolution of images) to allow for appropriate medical decision making by the Practitioner; and/or   In rare instances, security protocols could fail, causing a breach of personal health information.  Furthermore, I acknowledge that it is my  responsibility to provide information about my medical history, conditions and care that is complete and accurate to the best of my ability. I acknowledge that Practitioner's advice, recommendations, and/or decision may be based on factors not within their control, such as incomplete or inaccurate data provided by me or distortions of diagnostic images or specimens that may result from electronic transmissions. I understand that the practice of medicine is not an exact science and that Practitioner makes no warranties or guarantees regarding treatment outcomes. I acknowledge that  I will receive a copy of this consent concurrently upon execution via email to the email address I last provided but may also request a printed copy by calling the office of [place].    I understand that my insurance will be billed for this visit.   I have read or had this consent read to me.  I understand the contents of this consent, which adequately explains the benefits and risks of the Services being provided via telemedicine.   I have been provided ample opportunity to ask questions regarding this consent and the Services and have had my questions answered to my satisfaction.  I give my informed consent for the services to be provided through the use of telemedicine in my medical care  By participating in this telemedicine visit I agree to the above.

## 2018-10-31 NOTE — Progress Notes (Signed)
Progress Note  Patient Name: Kristine Oliver Date of Encounter: 10/31/2018  Primary Cardiologist: [name], MD   Subjective   Ready to go home. Breathing is at baseline. Hip pain chronic. No CP  Inpatient Medications    Scheduled Meds: . amLODipine  2.5 mg Oral Daily  . apixaban  2.5 mg Oral BID  . clopidogrel  75 mg Oral Daily  . docusate sodium  100 mg Oral Daily  . gabapentin  100 mg Oral BID  . insulin aspart  0-5 Units Subcutaneous QHS  . insulin aspart  0-9 Units Subcutaneous TID WC  . levothyroxine  75 mcg Oral Daily  . oxyCODONE  10 mg Oral QID  . pantoprazole  40 mg Oral Daily  . ramipril  5 mg Oral Daily  . rosuvastatin  20 mg Oral q1800  . sodium chloride flush  3 mL Intravenous Once  . sodium chloride flush  3 mL Intravenous Q12H  . valACYclovir  500 mg Oral BID  . vitamin B-12  500 mcg Oral Daily   Continuous Infusions: . sodium chloride     PRN Meds: sodium chloride, acetaminophen, ALPRAZolam, cycloSPORINE, Glycerin (Adult), lidocaine, Melatonin, nitroGLYCERIN, ondansetron (ZOFRAN) IV, polyvinyl alcohol, sodium chloride, sodium chloride flush, zolpidem   Vital Signs    Vitals:   10/30/18 1449 10/30/18 2126 10/31/18 0025 10/31/18 0654  BP: 119/62 137/87  136/65  Pulse: (!) 58 78  73  Resp:  18  16  Temp:  98 F (36.7 C)  97.9 F (36.6 C)  TempSrc:  Oral  Oral  SpO2: 99% 98%  98%  Weight:   72.6 kg   Height:        Intake/Output Summary (Last 24 hours) at 10/31/2018 0754 Last data filed at 10/31/2018 0600 Gross per 24 hour  Intake 1143.65 ml  Output 1800 ml  Net -656.35 ml   Last 3 Weights 10/31/2018 10/30/2018 10/29/2018  Weight (lbs) 160 lb 168 lb 14.4 oz 167 lb 14.4 oz  Weight (kg) 72.576 kg 76.613 kg 76.159 kg      Telemetry     AFIB rate ctl 70- Personally Reviewed  ECG    No new tracings - Personally Reviewed  Physical Exam   GEN: No acute distress.   Neck: No JVD, edentulous Cardiac: IRREG normal rate, no murmurs, rubs,  or gallops.  Respiratory: Clear to auscultation bilaterally. GI: Soft, nontender, non-distended  MS: No edema; No deformity. Limp noted Neuro:  Nonfocal  Psych: Normal affect   Labs    Chemistry Recent Labs  Lab 10/29/18 1615 10/30/18 0330  NA 137 138  K 4.2 4.6  CL 106 108  CO2 23 20*  GLUCOSE 128* 102*  BUN 22 26*  CREATININE 1.47* 1.66*  CALCIUM 10.8* 10.9*  PROT  --  6.8  ALBUMIN  --  3.7  AST  --  21  ALT  --  15  ALKPHOS  --  107  BILITOT  --  0.6  GFRNONAA 32* 28*  GFRAA 37* 32*  ANIONGAP 8 10     Hematology Recent Labs  Lab 10/29/18 1355 10/31/18 0400  WBC 8.3 8.0  RBC 3.78* 3.89  HGB 11.0* 11.4*  HCT 36.4 36.0  MCV 96.3 92.5  MCH 29.1 29.3  MCHC 30.2 31.7  RDW 17.1* 16.6*  PLT 216 209    Cardiac Enzymes Recent Labs  Lab 10/29/18 1615 10/29/18 2320 10/30/18 0330 10/30/18 1008  TROPONINI 0.08* 0.12* 0.08* 0.05*   No results  for input(s): TROPIPOC in the last 168 hours.   BNPNo results for input(s): BNP, PROBNP in the last 168 hours.   DDimer No results for input(s): DDIMER in the last 168 hours.   Radiology    Dg Chest 2 View  Result Date: 10/29/2018 CLINICAL DATA:  Left-sided chest pain. EXAM: CHEST - 2 VIEW COMPARISON:  April 22, 2018. FINDINGS: The cardiomediastinal silhouette is stable. Mild interstitial prominence suggest pulmonary venous congestion. No other changes or acute abnormalities. IMPRESSION: Cardiomegaly and mild pulmonary venous congestion. Electronically Signed   By: Dorise Bullion III M.D   On: 10/29/2018 14:54    Cardiac Studies   CATH: 03/21/2018:  Prox RCA to Mid RCA lesion is 25% stenosed.  Mid Cx lesion is 90% stenosed.  A drug-eluting stent was successfully placed using a STENT SIERRA 2.75 X 28 MM.  Post intervention, there is a 0% residual stenosis.  Ost Cx to Prox Cx lesion is 50% stenosed.  Previously placed Mid LAD stent (unknown type) is widely patent.  Prox LAD lesion is 25% stenosed.  LV  end diastolic pressure is mildly elevated. LVEDP 17 mm Hg.  There is no aortic valve stenosis.  Vasospasm and discomfort in the right arm from the radial approach. Recommend uninterrupted dual antiplatelet therapy with Aspirin 81mg  daily and Clopidogrel 75mg  daily for a minimum of 12 months (ACS - Class I recommendation). Patient was already on Plavix. COuld consdier Brilinta instead if bleeding risk is low.   TTE: 03/19/2018  Left ventricle: The cavity size was normal. There was mild concentric hypertrophy. Systolic function was normal. The estimated ejection fraction was in the range of 60% to 65%. Wall motion was normal; there were no regional wall motion abnormalities. Doppler parameters are consistent with abnormal left ventricular relaxation (grade 1 diastolic dysfunction). Doppler parameters are consistent with elevated ventricular end-diastolic filling pressure. - Aortic valve: There was trivial regurgitation. - Mitral valve: There was mild regurgitation. - Left atrium: The atrium was mildly dilated. - Right ventricle: Systolic function was normal. - Tricuspid valve: There was mild regurgitation. - Pulmonic valve: There was no regurgitation. - Pulmonary arteries: Systolic pressure was within the normal range. - Inferior vena cava: The vessel was normal in size. - Pericardium, extracardiac: There was no pericardial effusion.  Patient Profile     85 y.o. female with a hx of NSTEMI 2006 w/ DES LAD, last cath 03/2018 w/ DES CFX, D-CHF, CKD III, DM, HTN, HLD, hypothyroid, obesity, Afib dx 07/2018, who is being seen today for the evaluation of chest pain.  Assessment & Plan    1. Chest pain 2. Mild troponin elevation  - troponin 0.08 --> 0.12 --> 0.08 - consistent with demand ischemia in the setting of CHF exacerbation and AKI - chest pain has resolved  - continue plavix given her recent stent   3. Acute on chronic diastolic heart failure (needs to  decrease potato chip consumption) - BNP on admission 2300 - CXR with vascular congestion - she is diuresing on 40 mg IV lasix BID - has received 2 doses so far - she is overall net negative 1L with 1.8 L urine output yesterday - weight is 160 lbs from 169 lbs on admission - sCr is 1.66, baseline is 1.16-1.26 - K is 4.6  - Will change to lasix 40mg  PO QD at home from 20.    4. Long-standing persistent Afib - telemetry with rate controlled Afib - heart rates in the 70's - eliquis at the 2.5  mg dose given her creatinine and age. Continue this dose   5. AKI - sCr 1.66, baseline appears to be 1.16-1.26 - follow daily labs - Continue ACE-I creat 1.66 now, was 1.5 2-3 months ago. Within range.   6. DM2 - SSI   7. Hypertension - on amlodpine and ramipril   Plan to perform TOC virtual visit if possible in 7-14 days.    Labs BMET 2 weeks with change in lasix to 40.  OK for DC   For questions or updates, please contact [place] Please consult [email or web address] for contact info under        Signed, [name], MD

## 2018-11-01 ENCOUNTER — Other Ambulatory Visit: Payer: Self-pay

## 2018-11-01 MED ORDER — AMLODIPINE BESYLATE 2.5 MG PO TABS: 2.5000 mg | ORAL_TABLET | Freq: Every day | ORAL | 3 refills | Status: DC

## 2018-11-01 NOTE — Telephone Encounter (Signed)
Patient contacted regarding discharge from [facility] on 10/31/18.  Patient understands to follow up with provider Cecilie Kicks NP on 11/10/18 at 8:00am at tele health phone visit. Patient understands discharge instructions? yes Patient understands medications and regiment? Spent 30 minutes reviewing meds with pt Patient understands to bring all medications to this visit? Yes     Virtual Visit Pre-Appointment Phone Call  Steps For Call:  1. Confirm consent - "In the setting of the current Covid19 crisis, you are scheduled for a (phone or video) visit with your provider on (date) at (time).  Just as we do with many in-office visits, in order for you to participate in this visit, we must obtain consent.  If you'd like, I can send this to your mychart (if signed up) or email for you to review.  Otherwise, I can obtain your verbal consent now.  All virtual visits are billed to your insurance company just like a normal visit would be.  By agreeing to a virtual visit, we'd like you to understand that the technology does not allow for your provider to perform an examination, and thus may limit your provider's ability to fully assess your condition.  Finally, though the technology is pretty good, we cannot assure that it will always work on either your or our end, and in the setting of a video visit, we may have to convert it to a phone-only visit.  In either situation, we cannot ensure that we have a secure connection.  Are you willing to proceed?"  2. Give patient instructions for WebEx download to smartphone as below if video visit.Marland Kitchen pt does not have capability for [facility]  3. Advise patient to be prepared with any vital sign or heart rhythm information, their current medicines, and a piece of paper and pen handy for any instructions they may receive the day of their visit  4. Inform patient they will receive a phone call 15 minutes prior to their appointment time (may be from unknown caller ID) so they  should be prepared to answer  5. Confirm that appointment type is correct in Epic appointment notes (video vs telephone)    TELEPHONE CALL NOTE  Kristine Oliver has been deemed a candidate for a follow-up tele-health visit to limit community exposure during the Covid-19 pandemic. I spoke with the patient via phone to ensure availability of phone/video source, confirm preferred email & phone number, and discuss instructions and expectations.  I reminded Kristine Oliver to be prepared with any vital sign and/or heart rhythm information that could potentially be obtained via home monitoring, at the time of her visit. I reminded Kristine Oliver to expect a phone call at the time of her visit if her visit.  Did the patient verbally acknowledge consent to treatment? yes  [name], RN 11/01/2018 10:57 AM   CONSENT FOR TELE-HEALTH VISIT - PLEASE REVIEW  I hereby voluntarily request, consent and authorize [facility] and its employed or contracted physicians, physician assistants, nurse practitioners or other licensed health care professionals (the Practitioner), to provide me with telemedicine health care services (the "Services") as deemed necessary by the treating Practitioner. I acknowledge and consent to receive the Services by the Practitioner via telemedicine. I understand that the telemedicine visit will involve communicating with the Practitioner through live audiovisual communication technology and the disclosure of certain medical information by electronic transmission. I acknowledge that I have been given the opportunity to request an in-person assessment or other available alternative prior  to the telemedicine visit and am voluntarily participating in the telemedicine visit.  I understand that I have the right to withhold or withdraw my consent to the use of telemedicine in the course of my care at any time, without affecting my right to future care or treatment, and that the  Practitioner or I may terminate the telemedicine visit at any time. I understand that I have the right to inspect all information obtained and/or recorded in the course of the telemedicine visit and may receive copies of available information for a reasonable fee.  I understand that some of the potential risks of receiving the Services via telemedicine include:  Marland Kitchen Delay or interruption in medical evaluation due to technological equipment failure or disruption; . Information transmitted may not be sufficient (e.g. poor resolution of images) to allow for appropriate medical decision making by the Practitioner; and/or  . In rare instances, security protocols could fail, causing a breach of personal health information.  Furthermore, I acknowledge that it is my responsibility to provide information about my medical history, conditions and care that is complete and accurate to the best of my ability. I acknowledge that Practitioner's advice, recommendations, and/or decision may be based on factors not within their control, such as incomplete or inaccurate data provided by me or distortions of diagnostic images or specimens that may result from electronic transmissions. I understand that the practice of medicine is not an exact science and that Practitioner makes no warranties or guarantees regarding treatment outcomes. I acknowledge that I will receive a copy of this consent concurrently upon execution via email to the email address I last provided but may also request a printed copy by calling the office of [place].    I understand that my insurance will be billed for this visit.   I have read or had this consent read to me. . I understand the contents of this consent, which adequately explains the benefits and risks of the Services being provided via telemedicine.  . I have been provided ample opportunity to ask questions regarding this consent and the Services and have had my questions answered to my  satisfaction. . I give my informed consent for the services to be provided through the use of telemedicine in my medical care  By participating in this telemedicine visit I agree to the above.

## 2018-11-09 NOTE — Progress Notes (Signed)
Virtual Visit via Telephone Note   This visit type was conducted due to national recommendations for restrictions regarding the COVID-19 Pandemic (e.g. social distancing) in an effort to limit this patient's exposure and mitigate transmission in our community.  Due to her co-morbid illnesses, this patient is at least at moderate risk for complications without adequate follow up.  This format is felt to be most appropriate for this patient at this time.  The patient did not have access to video technology/had technical difficulties with video requiring transitioning to audio format only (telephone).  All issues noted in this document were discussed and addressed.  No physical exam could be performed with this format.  Please refer to the patient's chart for her  consent to telehealth for [facility]   Evaluation Performed:  Follow-up visit  Date:  11/10/2018   ID:  Morgin, Halls April 29, 1931, MRN [id number]  Patient Location: Home  Provider Location: Home  PCP:  Lavone Orn, MD  Cardiologist:  [name], MD  Electrophysiologist:  None   Chief Complaint:    Post hospital for unstable angina  History of Present Illness:    Kristine Oliver is a 85 y.o. female who presents via audio conferencing for a telehealth visit today.  Could only due phone visit.  She has a hx of NSTEMI 2006 w/ DES LAD, last cath 03/2018 w/ DES CFX, D-CHF, CKD III, DM, HTN, HLD, hypothyroid, obesity, Afib dx 07/2018, who presented to [facility] with chest pain.  Ms. Sias was seen by Dr Marlou Porch 09/21/2018, considered to have longstanding persistent atrial fibrillation. She complained of fatigue and bruising, but she had a DES circumflex 03/2018 so needs to stay on Plavix and her CHA2DS2-VASc = 7 (age x 2, female, CAD, HTN, DM, CHF), so needs to continue Eliquis.    Pt admitted to [facility] 10/29/18 after sudden on set of chest pressure into her L arm with standing.  Described 8-9/10 and SOB and mild nausea.  NTG with  mild relief.  Still with pain on arrival to ER with oxy.   Troponin peaked at 012, felt to be demand ischemia with acute diastolic HF.   She was diuresed with IV lasix and wt down from 169 lbs to 160, she did have dietary indiscretion.  Discharged on higher dose of lasix. From 20 mg daily to 40 mg daily.   Her persistent a fib was rate controlled. And Eliquis was decreased to 2.5 mg BID die to age and Cr.  She does need BMP.   Her imdur at discharge was stopped.  Due to increase of lasix   Today her wt is up at 168 but with her home scales this is her "dry" wt per pt.  When she is into the 170s is when she has edema.  Currently no edema, no chest pain,  She has chronic SOB but no worse than her usual.  She does not need to prop up at night to sleep.  She does walk outside every day, but she notes she is sleeping a lot.  She has fatigue and chronic neuropathy pain.  These complaints continue.    She knows she is becoming weaker and she is ready to die but hopes it is not soon.  She enjoys hearing the birds sing and watching deer near her yard.   She is wearing her back brace for back pain.  No bleeding.   The patient does not have symptoms concerning for COVID-19 infection (fever, chills,  cough, or new shortness of breath).    Past Medical History:  Diagnosis Date  . Anxiety   . Bradycardia   . CAD (coronary artery disease)    a. 08/2004 NSTEMI/PCI: LAD 21m/d (2.75 x 28 mm Taxus 2 DES);  b. 10/2010 Cath: LM nl, LAD patent stents w/ jailed septal, LCX 30, RCA 30; c. 06/2013 MV: No ischemia. Small fixed anteroseptal defect, ? scar vs atten. EF 62%; d. 03/2016 MV: EF 57%, Low risk.  . Chronic diastolic heart failure ([place])    a. Echo 7/13:  mod LVH, EF 65-70%, vigorous LV, Gr 1 diast dysfn, mild LAE; b. 05/2017 Echo: EF 60-65%, mod LVH, no rwma, Gr1 DD, mildly dil LA.  Marland Kitchen Chronic pain disorder   . CKD (chronic kidney disease), stage III ([place])   . DDD (degenerative disc disease), lumbosacral    , Spinal  stenosis  . Diabetes mellitus ([place])    , Type II  . GERD (gastroesophageal reflux disease)   . HTN (hypertension)   . Hyperlipidemia   . Hypothyroidism   . IBS (irritable bowel syndrome)   . Lumbar disc disease   . Nonproliferative diabetic retinopathy associated with type 2 diabetes mellitus ([place])   . Obesity (BMI 30-39.9)   . Peripheral neuropathy    , Associated with DM  . Scoliosis    Past Surgical History:  Procedure Laterality Date  . BACK SURGERY    . CARDIAC CATHETERIZATION    . CARDIOVERSION N/A 08/25/2018   Procedure: CARDIOVERSION;  Surgeon: Jerline Pain, MD;  Location: [facility] ENDOSCOPY;  Service: Cardiovascular;  Laterality: N/A;  . CHOLECYSTECTOMY    . CORONARY STENT INTERVENTION N/A 03/21/2018   Procedure: CORONARY STENT INTERVENTION;  Surgeon: Jettie Booze, MD;  Location: [facility] CV LAB;  Service: Cardiovascular;  Laterality: N/A;  . DILATION AND CURETTAGE OF UTERUS    . HEMILAMINOTOMY LUMBAR SPINE  2002   Rt L5, with decompression and foraminotomy  . INDUCED ABORTION     , x2  . LAMINOTOMY Right   . LEFT HEART CATH AND CORONARY ANGIOGRAPHY N/A 03/21/2018   Procedure: LEFT HEART CATH AND CORONARY ANGIOGRAPHY;  Surgeon: Jettie Booze, MD;  Location: [facility] CV LAB;  Service: Cardiovascular;  Laterality: N/A;  . LUMBAR [place] SURGERY  07/1999   L5-S1  . PILONIDAL CYST EXCISION    . ROTATOR CUFF REPAIR    . TONSILLECTOMY    . TOTAL ABDOMINAL HYSTERECTOMY W/ BILATERAL SALPINGOOPHORECTOMY       Current Meds  Medication Sig  . amLODipine (NORVASC) 2.5 MG tablet Take 1 tablet (2.5 mg total) by mouth daily.  Marland Kitchen antiseptic oral rinse (BIOTENE) LIQD 15 mLs by Mouth Rinse route 2 (two) times daily.   Marland Kitchen apixaban (ELIQUIS) 2.5 MG TABS tablet Take 1 tablet (2.5 mg total) by mouth 2 (two) times daily.  . ASPERCREME LIDOCAINE EX Apply 1 application topically 2 (two) times daily as needed (back/feet pain).  . Carboxymethylcellulose Sod PF (RETAINE CMC) 0.5 %  SOLN Place 1 drop into both eyes daily.   . clindamycin (CLEOCIN T) 1 % lotion Apply 1 application topically 2 (two) times daily. Facial wash  . clopidogrel (PLAVIX) 75 MG tablet Take 1 tablet (75 mg total) by mouth daily.  . cycloSPORINE (RESTASIS) 0.05 % ophthalmic emulsion Place 1 drop into both eyes 2 (two) times daily as needed (dry eyes).   Marland Kitchen docusate sodium (COLACE) 100 MG capsule Take 100 mg by mouth daily.  . furosemide (  LASIX) 40 MG tablet Take 1 tablet (40 mg total) by mouth daily.  Marland Kitchen gabapentin (NEURONTIN) 300 MG capsule Take 300 mg by mouth 2 (two) times daily.   Marland Kitchen levothyroxine (SYNTHROID, LEVOTHROID) 75 MCG tablet Take 75 mcg by mouth daily.  . Melatonin 3 MG TABS Take 3 mg by mouth at bedtime as needed (sleep).  . metFORMIN (GLUCOPHAGE) 500 MG tablet Take 500 mg by mouth 2 (two) times daily with a meal.   . nitroGLYCERIN (NITROSTAT) 0.4 MG SL tablet Place 1 tablet (0.4 mg total) under the tongue every 5 (five) minutes x 3 doses as needed for chest pain.  Marland Kitchen oxyCODONE (OXY IR/ROXICODONE) 5 MG immediate release tablet Take 5 mg by mouth at bedtime.  . Oxycodone HCl 10 MG TABS Take 1 tablet by mouth 4 (four) times daily.  . pantoprazole (PROTONIX) 40 MG tablet Take 1 tablet (40 mg total) by mouth daily.  Marland Kitchen PREMARIN vaginal cream Place 1 Applicatorful vaginally 2 (two) times a week.   . ramipril (ALTACE) 5 MG capsule TAKE 1 CAPSULE(5 MG) BY MOUTH DAILY (Patient taking differently: Take 5 mg by mouth daily. )  . rosuvastatin (CRESTOR) 20 MG tablet Take 1 tablet (20 mg total) by mouth daily at 6 PM.  . sodium chloride (OCEAN) 0.65 % SOLN nasal spray Place 1 spray into both nostrils as needed for congestion (dryness).  . valACYclovir (VALTREX) 500 MG tablet Take 500 mg by mouth 2 (two) times daily.   . vitamin B-12 (CYANOCOBALAMIN) 500 MCG tablet Take 500 mcg by mouth daily.     Allergies:   Pravastatin and Prednisone   Social History   Tobacco Use  . Smoking status: Former Smoker     Years: 35.00  . Smokeless tobacco: Never Used  Substance Use Topics  . Alcohol use: No    Comment: social drinking, not often  . Drug use: No     Family Hx: The patient's family history includes Coronary artery disease in her mother; Diabetes in her father; Heart failure in her father.  ROS:   Please see the history of present illness.    General:no colds or fevers, no weight changes Skin:no rashes or ulcers HEENT:no blurred vision, no congestion CV:see HPI PUL:see HPI GI:no diarrhea constipation or melena, no indigestion GU:no hematuria, no dysuria MS:no joint pain, no claudication Neuro:no syncope, no lightheadedness Endo:+ diabetes, + thyroid disease  All other systems reviewed and are negative.   Prior CV studies:   The following studies were reviewed today:  CATH: 03/21/2018:  Prox RCA to Mid RCA lesion is 25% stenosed.  Mid Cx lesion is 90% stenosed.  A drug-eluting stent was successfully placed using a STENT SIERRA 2.75 X 28 MM.  Post intervention, there is a 0% residual stenosis.  Ost Cx to Prox Cx lesion is 50% stenosed.  Previously placed Mid LAD stent (unknown type) is widely patent.  Prox LAD lesion is 25% stenosed.  LV end diastolic pressure is mildly elevated. LVEDP 17 mm Hg.  There is no aortic valve stenosis.  Vasospasm and discomfort in the right arm from the radial approach. Recommend uninterrupted dual antiplatelet therapy with Aspirin 81mg  daily and Clopidogrel 75mg  daily for a minimum of 12 months (ACS - Class I recommendation). Patient was already on Plavix. COuld consdier Brilinta instead if bleeding risk is low.   TTE: 03/19/2018  Left ventricle: The cavity size was normal. There was mild concentric hypertrophy. Systolic function was normal. The estimated ejection fraction was in  the range of 60% to 65%. Wall motion was normal; there were no regional wall motion abnormalities. Doppler parameters are consistent with  abnormal left ventricular relaxation (grade 1 diastolic dysfunction). Doppler parameters are consistent with elevated ventricular end-diastolic filling pressure. - Aortic valve: There was trivial regurgitation. - Mitral valve: There was mild regurgitation. - Left atrium: The atrium was mildly dilated. - Right ventricle: Systolic function was normal. - Tricuspid valve: There was mild regurgitation. - Pulmonic valve: There was no regurgitation. - Pulmonary arteries: Systolic pressure was within the normal range. - Inferior vena cava: The vessel was normal in size. - Pericardium, extracardiac: There was no pericardial effusion  Labs/Other Tests and Data Reviewed:    EKG:  An ECG dated 10/31/18 was personally reviewed today and demonstrated:  atrial fib rate controlled and no acute changes  Recent Labs: 04/27/2018: NT-Pro BNP 2,300 10/29/2018: TSH 1.164 10/30/2018: ALT 15; BUN 26; Creatinine, Ser 1.66; Potassium 4.6; Sodium 138 10/31/2018: Hemoglobin 11.4; Platelets 209   Recent Lipid Panel Lab Results  Component Value Date/Time   CHOL 123 10/30/2018 03:30 AM   TRIG 104 10/30/2018 03:30 AM   HDL 55 10/30/2018 03:30 AM   CHOLHDL 2.2 10/30/2018 03:30 AM   LDLCALC 47 10/30/2018 03:30 AM    Wt Readings from Last 3 Encounters:  11/10/18 168 lb (76.2 kg)  10/31/18 160 lb (72.6 kg)  09/21/18 178 lb (80.7 kg)     Objective:    Vital Signs:  BP 117/72   Pulse 87   Ht 4\' 11"  (1.499 m)   Wt 168 lb (76.2 kg)   BMI 33.93 kg/m     female in no acute distress cheerful with normal affect. Lungs mildly SOB with talking but she tells me this is her chronic SOB.  No edema. Per pt.   ASSESSMENT & PLAN:    1. CAD with recent NSTEMI no chest pain and is able to do her regular activities.  She still drives and does some walking. She is off imdur, but will not resume with BP 117, and no angina. 2. Chronic diastolic heart failure. With recent acute episode - pt notes no longer eating  potato chips, wt is stable with home weights.  She is on lasix 40 mg daily up from 20 mg, will check BMP to eval her kidney disease  3. CKD 3-4 with increase in diuretic will recheck. 4. HLD recent LDL at 47 continue statin.   5. Persistent atrial fib rate stable 6. anticoagulation on eliquis, with decreased dose due to CKD and age. 7. DM-2 per PCP   COVID-19 Education: The signs and symptoms of COVID-19 were discussed with the patient and how to seek care for testing (follow up with PCP or arrange E-visit).  The importance of social distancing was discussed today. She has not been able to wear mask due to SOB.    Time:   Today, I have spent 20 minutes with the patient with telehealth technology discussing the above problems.  She was appreciative of the call.   Medication Adjustments/Labs and Tests Ordered: Current medicines are reviewed at length with the patient today.  Concerns regarding medicines are outlined above.  Tests Ordered: Orders Placed This Encounter  Procedures  . Basic metabolic panel   Medication Changes: No orders of the defined types were placed in this encounter.   Disposition:  Follow up in 1 month(s) with me and 3 months with Dr. Marlou Porch.  Signed, Cecilie Kicks, NP  11/10/2018 9:34 AM  [facility]

## 2018-11-10 ENCOUNTER — Other Ambulatory Visit: Payer: Self-pay

## 2018-11-10 ENCOUNTER — Encounter: Payer: Self-pay | Admitting: Cardiology

## 2018-11-10 ENCOUNTER — Telehealth (INDEPENDENT_AMBULATORY_CARE_PROVIDER_SITE_OTHER): Payer: Medicare Other | Admitting: Cardiology

## 2018-11-10 VITALS — BP 117/72 | HR 87 | Ht 59.0 in | Wt 168.0 lb

## 2018-11-10 DIAGNOSIS — I10 Essential (primary) hypertension: Secondary | ICD-10-CM

## 2018-11-10 DIAGNOSIS — I5032 Chronic diastolic (congestive) heart failure: Secondary | ICD-10-CM

## 2018-11-10 DIAGNOSIS — E119 Type 2 diabetes mellitus without complications: Secondary | ICD-10-CM

## 2018-11-10 DIAGNOSIS — I2510 Atherosclerotic heart disease of native coronary artery without angina pectoris: Secondary | ICD-10-CM

## 2018-11-10 DIAGNOSIS — I4819 Other persistent atrial fibrillation: Secondary | ICD-10-CM

## 2018-11-10 DIAGNOSIS — I252 Old myocardial infarction: Secondary | ICD-10-CM

## 2018-11-10 DIAGNOSIS — Z79899 Other long term (current) drug therapy: Secondary | ICD-10-CM

## 2018-11-10 DIAGNOSIS — N179 Acute kidney failure, unspecified: Secondary | ICD-10-CM

## 2018-11-10 NOTE — Patient Instructions (Signed)
Medication Instructions:  Your physician recommends that you continue on your current medications as directed. Please refer to the Current Medication list given to you today.  If you need a refill on your cardiac medications before your next appointment, please call your pharmacy.   Lab work: 11/11/2018:  BMET AT THE OFFICE  If you have labs (blood work) drawn today and your tests are completely normal, you will receive your results only by: Marland Kitchen MyChart Message (if you have MyChart) OR . A paper copy in the mail If you have any lab test that is abnormal or we need to change your treatment, we will call you to review the results.  Testing/Procedures: None ordere  Follow-Up:  Your physician recommends that you schedule a follow-up appointment in: PHONE VISIT 12/01/2018 WITH Cecilie Kicks, NP @ 1:30  At [facility], you and your health needs are our priority.  As part of our continuing mission to provide you with exceptional heart care, we have created designated Provider Care Teams.  These Care Teams include your primary Cardiologist (physician) and Advanced Practice Providers (APPs -  Physician Assistants and Nurse Practitioners) who all work together to provide you with the care you need, when you need it. You will need a follow up appointment in 3 months.  Please call our office 2 months in advance to schedule this appointment.  You may see [name], MD or one of the following Advanced Practice Providers on your designated Care Team:   Truitt Merle, NP Cecilie Kicks, NP . Kathyrn Drown, NP  Any Other Special Instructions Will Be Listed Below (If Applicable).

## 2018-11-11 ENCOUNTER — Other Ambulatory Visit: Payer: Medicare Other

## 2018-11-11 ENCOUNTER — Other Ambulatory Visit: Payer: Self-pay

## 2018-11-11 DIAGNOSIS — I5032 Chronic diastolic (congestive) heart failure: Secondary | ICD-10-CM

## 2018-11-11 DIAGNOSIS — N179 Acute kidney failure, unspecified: Secondary | ICD-10-CM

## 2018-11-12 LAB — BASIC METABOLIC PANEL
BUN/Creatinine Ratio: 20 (ref 12–28)
BUN: 35 mg/dL — ABNORMAL HIGH (ref 8–27)
CO2: 18 mmol/L — ABNORMAL LOW (ref 20–29)
Calcium: 11 mg/dL — ABNORMAL HIGH (ref 8.7–10.3)
Chloride: 101 mmol/L (ref 96–106)
Creatinine, Ser: 1.73 mg/dL — ABNORMAL HIGH (ref 0.57–1.00)
GFR calc Af Amer: 31 mL/min/{1.73_m2} — ABNORMAL LOW (ref >59)
GFR calc non Af Amer: 27 mL/min/{1.73_m2} — ABNORMAL LOW (ref >59)
Glucose: 120 mg/dL — ABNORMAL HIGH (ref 65–99)
Potassium: 4.7 mmol/L (ref 3.5–5.2)
Sodium: 137 mmol/L (ref 134–144)

## 2018-11-14 ENCOUNTER — Telehealth: Payer: Self-pay | Admitting: *Deleted

## 2018-11-14 ENCOUNTER — Ambulatory Visit: Payer: Medicare Other

## 2018-11-14 DIAGNOSIS — Z79899 Other long term (current) drug therapy: Secondary | ICD-10-CM

## 2018-11-14 MED ORDER — FUROSEMIDE 40 MG PO TABS: 40.0000 mg | ORAL_TABLET | Freq: Every day | ORAL | 3 refills | Status: DC

## 2018-11-14 NOTE — Telephone Encounter (Signed)
-----   Message from Isaiah Serge, NP sent at 11/14/2018  9:50 AM EDT ----- Have pt change lasix to 20 mg alternating with 40 mg every other day.  And recheck BMP in another week.  Remind her no to very little salt.  And to weigh daily call if wt climbs.

## 2018-11-21 ENCOUNTER — Other Ambulatory Visit: Payer: Medicare Other | Admitting: *Deleted

## 2018-11-21 ENCOUNTER — Other Ambulatory Visit: Payer: Self-pay

## 2018-11-21 DIAGNOSIS — Z79899 Other long term (current) drug therapy: Secondary | ICD-10-CM

## 2018-11-22 LAB — BASIC METABOLIC PANEL
BUN/Creatinine Ratio: 18 (ref 12–28)
BUN: 35 mg/dL — ABNORMAL HIGH (ref 8–27)
CO2: 23 mmol/L (ref 20–29)
Calcium: 11 mg/dL — ABNORMAL HIGH (ref 8.7–10.3)
Chloride: 103 mmol/L (ref 96–106)
Creatinine, Ser: 1.98 mg/dL — ABNORMAL HIGH (ref 0.57–1.00)
GFR calc Af Amer: 26 mL/min/{1.73_m2} — ABNORMAL LOW (ref >59)
GFR calc non Af Amer: 23 mL/min/{1.73_m2} — ABNORMAL LOW (ref >59)
Glucose: 125 mg/dL — ABNORMAL HIGH (ref 65–99)
Potassium: 4.6 mmol/L (ref 3.5–5.2)
Sodium: 142 mmol/L (ref 134–144)

## 2018-12-01 ENCOUNTER — Other Ambulatory Visit: Payer: Self-pay

## 2018-12-01 ENCOUNTER — Telehealth (INDEPENDENT_AMBULATORY_CARE_PROVIDER_SITE_OTHER): Payer: Medicare Other | Admitting: Cardiology

## 2018-12-01 ENCOUNTER — Encounter: Payer: Self-pay | Admitting: Cardiology

## 2018-12-01 VITALS — BP 133/75 | HR 91 | Ht 59.0 in | Wt 169.0 lb

## 2018-12-01 DIAGNOSIS — I5032 Chronic diastolic (congestive) heart failure: Secondary | ICD-10-CM

## 2018-12-01 DIAGNOSIS — I209 Angina pectoris, unspecified: Secondary | ICD-10-CM | POA: Diagnosis not present

## 2018-12-01 DIAGNOSIS — Z79899 Other long term (current) drug therapy: Secondary | ICD-10-CM

## 2018-12-01 DIAGNOSIS — I2510 Atherosclerotic heart disease of native coronary artery without angina pectoris: Secondary | ICD-10-CM

## 2018-12-01 DIAGNOSIS — I4819 Other persistent atrial fibrillation: Secondary | ICD-10-CM

## 2018-12-01 DIAGNOSIS — I10 Essential (primary) hypertension: Secondary | ICD-10-CM

## 2018-12-01 DIAGNOSIS — E119 Type 2 diabetes mellitus without complications: Secondary | ICD-10-CM

## 2018-12-01 DIAGNOSIS — N184 Chronic kidney disease, stage 4 (severe): Secondary | ICD-10-CM

## 2018-12-01 MED ORDER — ISOSORBIDE MONONITRATE ER 30 MG PO TB24: 15.0000 mg | ORAL_TABLET | Freq: Every day | ORAL | 3 refills | Status: DC

## 2018-12-01 MED ORDER — FUROSEMIDE 20 MG PO TABS: ORAL_TABLET | ORAL | 4 refills | Status: DC

## 2018-12-01 NOTE — Patient Instructions (Addendum)
Medication Instructions:  Your physician has recommended you make the following change in your medication:  1.  RESTART Imdur (Isorsibide) 30 mg taking only 1/2 tablet daily 2.  REDUCE the Lasix to 40 mg taking only 1/2 tablet daily.  May take extra 1/2 tablet  of lasix if wt up to 171 for one day. If no improvement in wt she should call us   If you need a refill on your cardiac medications before your next appointment, please call your pharmacy.   Lab work: 12/07/2018:  BMET  If you have labs (blood work) drawn today and your tests are completely normal, you will receive your results only by: Marland Kitchen MyChart Message (if you have MyChart) OR . A paper copy in the mail If you have any lab test that is abnormal or we need to change your treatment, we will call you to review the results.  Testing/Procedures: None ordered  Follow-Up: Your physician recommends that you schedule a follow-up appointment in: [place] PHONE CALL VISIT 12/15/2018 @ 1:15 WITH Cecilie Kicks, NP  Any Other Special Instructions Will Be Listed Below (If Applicable).

## 2018-12-01 NOTE — Progress Notes (Signed)
Virtual Visit via Telephone Note   This visit type was conducted due to national recommendations for restrictions regarding the COVID-19 Pandemic (e.g. social distancing) in an effort to limit this patient's exposure and mitigate transmission in our community.  Due to her co-morbid illnesses, this patient is at least at moderate risk for complications without adequate follow up.  This format is felt to be most appropriate for this patient at this time.  The patient did not have access to video technology/had technical difficulties with video requiring transitioning to audio format only (telephone).  All issues noted in this document were discussed and addressed.  No physical exam could be performed with this format.  Please refer to the patient's chart for her  consent to telehealth for [facility].   Pt unable to do video call  Evaluation Performed:  Follow-up visit  Date:  12/01/2018   ID:  Kristine, Oliver 1931/03/03, MRN [id number]  Patient Location: Home Provider Location: Office  PCP:  Lavone Orn, MD  Cardiologist:  [name], MD  Electrophysiologist:  None   Chief Complaint:  Chest pain  History of Present Illness:    Kristine Oliver is a 85 y.o. female with   She has a hx of NSTEMI 2006 w/ DES LAD, last cath 03/2018 w/ DES CFX, D-CHF, CKD III, DM, HTN, HLD, hypothyroid, obesity, Afib dx 07/2018, [facility]ed to [facility] with chest pain.  Kristine Oliver was seen by Dr Marlou Porch 09/21/2018, considered to have longstanding persistent atrial fibrillation. She complained of fatigue and bruising, but she had a DES circumflex 03/2018 so needs to stay on Plavix and her CHA2DS2-VASc = 7 (age x 2, female, CAD, HTN, DM, CHF), so needs to continue Eliquis.    Pt admitted to [facility] 10/29/18 after sudden on set of chest pressure into her L arm with standing.  Described 8-9/10 and SOB and mild nausea.  NTG with mild relief.  Still with pain on arrival to ER with oxy.   Troponin peaked at 012,  felt to be demand ischemia with acute diastolic HF.   She was diuresed with IV lasix and wt down from 169 lbs to 160, she did have dietary indiscretion.  Discharged on higher dose of lasix. From 20 mg daily to 40 mg daily.   Her persistent a fib was rate controlled. And Eliquis was decreased to 2.5 mg BID die to age and Cr.    Her imdur at discharge was stopped.  Due to increase of lasix   Last tele visit 11/10/18  her wt is up at 168 but with her home scales this is her "dry" wt per pt.  When she is into the 170s is when she has edema.  Currently no edema, no chest pain,  She has chronic SOB but no worse than her usual.  She does not need to prop up at night to sleep.  She does walk outside every day, but she notes she is sleeping a lot.  She has fatigue and chronic neuropathy pain.  These complaints continue.    She knows she is becoming weaker and she is ready to die but hopes it is not soon.  She enjoys hearing the birds sing and watching deer near her yard.   She is wearing her back brace for back pain.  No bleeding.   I decreased lasix with increase of Cr.  Last Cr of 1.98 BUN 35 decreased lasix to 20 mg daily but she had not yet rec'd  message.    Today she just had bout with chest pain.  She was rushing to take nephew to [place] and took a bath then developed chest pain.  She took total of 2 NTG with relief.  On phone call pain was resolved.  Her wt is stable.  No edema.  No blood in her stool or urine.  She has been doing well except for angina today.      The patient does not have symptoms concerning for COVID-19 infection (fever, chills, cough, or new shortness of breath).    Past Medical History:  Diagnosis Date   Anxiety    Bradycardia    CAD (coronary artery disease)    a. 08/2004 NSTEMI/PCI: LAD 59m/d (2.75 x 28 mm Taxus 2 DES);  b. 10/2010 Cath: LM nl, LAD patent stents w/ jailed septal, LCX 30, RCA 30; c. 06/2013 MV: No ischemia. Small fixed anteroseptal defect, ? scar vs atten. EF  62%; d. 03/2016 MV: EF 57%, Low risk.   Chronic diastolic heart failure ([place])    a. Echo 7/13:  mod LVH, EF 65-70%, vigorous LV, Gr 1 diast dysfn, mild LAE; b. 05/2017 Echo: EF 60-65%, mod LVH, no rwma, Gr1 DD, mildly dil LA.   Chronic pain disorder    CKD (chronic kidney disease), stage III (HCC)    DDD (degenerative disc disease), lumbosacral    , Spinal stenosis   Diabetes mellitus (HCC)    , Type II   GERD (gastroesophageal reflux disease)    HTN (hypertension)    Hyperlipidemia    Hypothyroidism    IBS (irritable bowel syndrome)    Lumbar disc disease    Nonproliferative diabetic retinopathy associated with type 2 diabetes mellitus (HCC)    Obesity (BMI 30-39.9)    Peripheral neuropathy    , Associated with DM   Scoliosis    Past Surgical History:  Procedure Laterality Date   BACK SURGERY     CARDIAC CATHETERIZATION     CARDIOVERSION N/A 08/25/2018   Procedure: CARDIOVERSION;  Surgeon: Jerline Pain, MD;  Location: [facility];  Service: Cardiovascular;  Laterality: N/A;   CHOLECYSTECTOMY     CORONARY STENT INTERVENTION N/A 03/21/2018   Procedure: CORONARY STENT INTERVENTION;  Surgeon: Jettie Booze, MD;  Location: [facility];  Service: Cardiovascular;  Laterality: N/A;   DILATION AND CURETTAGE OF UTERUS     HEMILAMINOTOMY LUMBAR SPINE  2002   Rt L5, with decompression and foraminotomy   INDUCED ABORTION     , x2   LAMINOTOMY Right    LEFT HEART CATH AND CORONARY ANGIOGRAPHY N/A 03/21/2018   Procedure: LEFT HEART CATH AND CORONARY ANGIOGRAPHY;  Surgeon: Jettie Booze, MD;  Location: [facility];  Service: Cardiovascular;  Laterality: N/A;   LUMBAR DISC SURGERY  07/1999   L5-S1   PILONIDAL CYST EXCISION     ROTATOR CUFF REPAIR     TONSILLECTOMY     TOTAL ABDOMINAL HYSTERECTOMY W/ BILATERAL SALPINGOOPHORECTOMY       Current Meds  Medication Sig   amLODipine (NORVASC) 2.5 MG tablet Take 1 tablet (2.5 mg total)  by mouth daily.   antiseptic oral rinse (BIOTENE) LIQD 15 mLs by Mouth Rinse route 2 (two) times daily.    apixaban (ELIQUIS) 2.5 MG TABS tablet Take 1 tablet (2.5 mg total) by mouth 2 (two) times daily.   ASPERCREME LIDOCAINE EX Apply 1 application topically 2 (two) times daily as needed (back/feet pain).   Carboxymethylcellulose Sod  PF (RETAINE CMC) 0.5 % SOLN Place 1 drop into both eyes daily.    clindamycin (CLEOCIN T) 1 % lotion Apply 1 application topically 2 (two) times daily. Facial wash   clopidogrel (PLAVIX) 75 MG tablet Take 1 tablet (75 mg total) by mouth daily.   cycloSPORINE (RESTASIS) 0.05 % ophthalmic emulsion Place 1 drop into both eyes 2 (two) times daily as needed (dry eyes).    docusate sodium (COLACE) 100 MG capsule Take 100 mg by mouth daily.   gabapentin (NEURONTIN) 300 MG capsule Take 300 mg by mouth 2 (two) times daily.    levothyroxine (SYNTHROID, LEVOTHROID) 75 MCG tablet Take 75 mcg by mouth daily.   Melatonin 3 MG TABS Take 3 mg by mouth at bedtime as needed (sleep).   metFORMIN (GLUCOPHAGE) 500 MG tablet Take 500 mg by mouth 2 (two) times daily with a meal.    nitroGLYCERIN (NITROSTAT) 0.4 MG SL tablet Place 1 tablet (0.4 mg total) under the tongue every 5 (five) minutes x 3 doses as needed for chest pain.   oxyCODONE (OXY IR/ROXICODONE) 5 MG immediate release tablet Take 5 mg by mouth at bedtime.   Oxycodone HCl 10 MG TABS Take 1 tablet by mouth 4 (four) times daily.   pantoprazole (PROTONIX) 40 MG tablet Take 1 tablet (40 mg total) by mouth daily.   PREMARIN vaginal cream Place 1 Applicatorful vaginally 2 (two) times a week.    ramipril (ALTACE) 5 MG capsule TAKE 1 CAPSULE(5 MG) BY MOUTH DAILY   rosuvastatin (CRESTOR) 20 MG tablet Take 1 tablet (20 mg total) by mouth daily at 6 PM.   sodium chloride (OCEAN) 0.65 % SOLN nasal spray Place 1 spray into both nostrils as needed for congestion (dryness).   valACYclovir (VALTREX) 500 MG tablet Take  500 mg by mouth 2 (two) times daily.    vitamin B-12 (CYANOCOBALAMIN) 500 MCG tablet Take 500 mcg by mouth daily.   [DISCONTINUED] furosemide (LASIX) 40 MG tablet Take 40 mg by mouth daily. Alternate days - Take 1/2 tab one day, take 1 whole day the next day     Allergies:   Pravastatin and Prednisone   Social History   Tobacco Use   Smoking status: Former Smoker    Years: 35.00   Smokeless tobacco: Never Used  Substance Use Topics   Alcohol use: No    Comment: social drinking, not often   Drug use: No     Family Hx: The patient's family history includes Coronary artery disease in her mother; Diabetes in her father; Heart failure in her father.  ROS:   Please see the history of present illness.    General:no colds or fevers, chronic runny nose, no weight changes Skin:no rashes or ulcers HEENT:no blurred vision, no congestion CV:see HPI PUL:see HPI GI:no diarrhea constipation or melena, no indigestion GU:no hematuria, no dysuria MS:no joint pain, no claudication Neuro:no syncope, no lightheadedness Endo:+ diabetes, no thyroid disease  All other systems reviewed and are negative.   Prior CV studies:   The following studies were reviewed today:  CATH: 03/21/2018:  Prox RCA to Mid RCA lesion is 25% stenosed.  Mid Cx lesion is 90% stenosed.  A drug-eluting stent was successfully placed using a STENT SIERRA 2.75 X 28 MM.  Post intervention, there is a 0% residual stenosis.  Ost Cx to Prox Cx lesion is 50% stenosed.  Previously placed Mid LAD stent (unknown type) is widely patent.  Prox LAD lesion is 25% stenosed.  LV  end diastolic pressure is mildly elevated. LVEDP 17 mm Hg.  There is no aortic valve stenosis.  Vasospasm and discomfort in the right arm from the radial approach. Recommend uninterrupted dual antiplatelet therapy with Aspirin 81mg  daily and Clopidogrel 75mg  daily for a minimum of 12 months (ACS - Class I recommendation). Patient was  already on Plavix. COuld consdier Brilinta instead if bleeding risk is low.   TTE: 03/19/2018  Left ventricle: The cavity size was normal. There was mild concentric hypertrophy. Systolic function was normal. The estimated ejection fraction was in the range of 60% to 65%. Wall motion was normal; there were no regional wall motion abnormalities. Doppler parameters are consistent with abnormal left ventricular relaxation (grade 1 diastolic dysfunction). Doppler parameters are consistent with elevated ventricular end-diastolic filling pressure. - Aortic valve: There was trivial regurgitation. - Mitral valve: There was mild regurgitation. - Left atrium: The atrium was mildly dilated. - Right ventricle: Systolic function was normal. - Tricuspid valve: There was mild regurgitation. - Pulmonic valve: There was no regurgitation. - Pulmonary arteries: Systolic pressure was within the normal range. - Inferior vena cava: The vessel was normal in size. - Pericardium, extracardiac: There was no pericardial effusion   Labs/Other Tests and Data Reviewed:    EKG:  An ECG dated 10/20/08  was personally reviewed today and demonstrated:  a fib rate controlled and no acute changes  Recent Labs: 04/27/2018: NT-Pro BNP 2,300 10/29/2018: TSH 1.164 10/30/2018: ALT 15 10/31/2018: Hemoglobin 11.4; Platelets 209 11/21/2018: BUN 35; Creatinine, Ser 1.98; Potassium 4.6; Sodium 142   Recent Lipid Panel Lab Results  Component Value Date/Time   CHOL 123 10/30/2018 03:30 AM   TRIG 104 10/30/2018 03:30 AM   HDL 55 10/30/2018 03:30 AM   CHOLHDL 2.2 10/30/2018 03:30 AM   LDLCALC 47 10/30/2018 03:30 AM    Wt Readings from Last 3 Encounters:  12/01/18 169 lb (76.7 kg)  11/10/18 168 lb (76.2 kg)  10/31/18 160 lb (72.6 kg)     Objective:    Vital Signs:  BP 133/75    Pulse 91    Ht 4\' 11"  (1.499 m)    Wt 169 lb (76.7 kg)    BMI 34.13 kg/m    VITAL SIGNS:  reviewed  Neuro alert and  oriented X 3 follows commands Lungs: can speak in complete sentences without SOB or wheezes Psych: pleasant affect  ASSESSMENT & PLAN:    1. Angina with recent NSTEMI and last PCI 03/2018.  Will add back her Imdur at 15 mg.  If recurrent pain today she will call 911, if anymore pain she will call the office and at that point should be brought in for EKG and DOD visit.   Otherwise I will talk with her in 2 weeks.   2. CAD with NSTEMI.  Resuming imdur 3. Chronic diastolic HF, now off salt, wt is stable will decrease lasix to 20 mg daily.  Check BMP in a week. If wt up to 171 lbs she will take an extra 20 mg if no wt loss she will call our office   4. 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 Persistent a fib. Stable and no bleeding on her eliquis. 6. DM-2 per PCP  COVID-19 Education: The signs and symptoms of COVID-19 were discussed with the patient and how to seek care for testing (follow up with PCP or arrange E-visit).  The importance of social distancing was discussed today.  Time:   Today, I have spent 20  minutes with the patient with telehealth technology discussing the above problems.     Medication Adjustments/Labs and Tests Ordered: Current medicines are reviewed at length with the patient today.  Concerns regarding medicines are outlined above.   Tests Ordered: Orders Placed This Encounter  Procedures   Basic metabolic panel    Medication Changes: Meds ordered this encounter  Medications   isosorbide mononitrate (IMDUR) 30 MG 24 hr tablet    Sig: Take 0.5 tablets (15 mg total) by mouth daily.    Dispense:  45 tablet    Refill:  3   furosemide (LASIX) 20 MG tablet    Sig: Take 1 tablet by mouth daily, may take a extra tablet daily only as needed if wt is up to 171    Dispense:  45 tablet    Refill:  4    Disposition:  Follow up in 2 week(s)  Signed, Cecilie Kicks, NP  12/01/2018 2:33 PM    [facility]

## 2018-12-07 ENCOUNTER — Other Ambulatory Visit: Payer: Medicare Other

## 2018-12-15 ENCOUNTER — Telehealth (INDEPENDENT_AMBULATORY_CARE_PROVIDER_SITE_OTHER): Payer: Medicare Other | Admitting: Cardiology

## 2018-12-15 ENCOUNTER — Telehealth: Payer: Self-pay

## 2018-12-15 ENCOUNTER — Other Ambulatory Visit: Payer: Self-pay

## 2018-12-15 ENCOUNTER — Encounter: Payer: Self-pay | Admitting: Cardiology

## 2018-12-15 VITALS — Ht 59.0 in | Wt 168.6 lb

## 2018-12-15 DIAGNOSIS — I10 Essential (primary) hypertension: Secondary | ICD-10-CM

## 2018-12-15 DIAGNOSIS — I5032 Chronic diastolic (congestive) heart failure: Secondary | ICD-10-CM

## 2018-12-15 DIAGNOSIS — I4819 Other persistent atrial fibrillation: Secondary | ICD-10-CM

## 2018-12-15 DIAGNOSIS — I209 Angina pectoris, unspecified: Secondary | ICD-10-CM | POA: Diagnosis not present

## 2018-12-15 DIAGNOSIS — I2510 Atherosclerotic heart disease of native coronary artery without angina pectoris: Secondary | ICD-10-CM

## 2018-12-15 NOTE — Progress Notes (Signed)
Virtual Visit via Telephone Note   This visit type was conducted due to national recommendations for restrictions regarding the COVID-19 Pandemic (e.g. social distancing) in an effort to limit this patient's exposure and mitigate transmission in our community.  Due to her co-morbid illnesses, this patient is at least at moderate risk for complications without adequate follow up.  This format is felt to be most appropriate for this patient at this time.  The patient did not have access to video technology/had technical difficulties with video requiring transitioning to audio format only (telephone).  All issues noted in this document were discussed and addressed.  No physical exam could be performed with this format.  Please refer to the patient's chart for her  consent to telehealth for [facility].   Date:  12/15/2018   ID:  Kristine Oliver, DOB Oct 07, 1930, MRN [id number]  Patient Location: Home Provider Location: Office  PCP:  Lavone Orn, MD  Cardiologist:  [name], MD  Electrophysiologist:  None   Evaluation Performed:  Follow-Up Visit  Chief Complaint:  Chest pain  History of Present Illness:    Kristine Oliver is a 86 y.o. female with ahx of NSTEMI 2006 w/ DES LAD, last cath 03/2018 w/ DES CFX, D-CHF, CKD III, DM, HTN, HLD, hypothyroid, obesity, Afib dx 07/2018, [facility] with chest pain.  Kristine Oliver was seen by Dr Marlou Porch 09/21/2018, considered to have longstanding persistent atrial fibrillation. She complained of fatigue and bruising, but she had a DES circumflex 03/2018 so needs to stay on Plavix and her CHA2DS2-VASc = 7 (age x 2, female, CAD, HTN, DM, CHF), so needs to continue Eliquis.   Pt admitted to [facility] 10/29/18 after sudden on set of chest pressure into her L arm with standing. Described 8-9/10 and SOB and mild nausea. NTG with mild relief. Still with pain on arrival to ER with oxy. Troponin peaked at 012, felt to be demand ischemia with acute  diastolic HF. She was diuresed with IV lasix and wt down from 169 lbs to 160, she did have dietary indiscretion. Discharged on higher dose of lasix. From 20 mg daily to 40 mg daily.   Her persistent a fib was rate controlled. And Eliquis was decreased to 2.5 mg BID die to age and Cr.  Her imdur at discharge was stopped.Due to increase of lasix  Tele visit 11/10/18 herwtis up at 168 but with her home scales this is her "dry" wt per pt. When she is into the 170s is when she has edema.  Tele Visit 12/01/18  I decreased lasix with increase of Cr.  Last Cr of 1.98 BUN 35 decreased lasix to 20 mg daily but she had not yet rec'd message.  she had chest pain as well.  With rushing to take nephew to [place]. Imdur resumed.  Today no further chest pain and SOB only if bending to do activity.  Her wt is stable and no edema.  No BP today but has been ranging in the 379K systolic. She wears mask and washes hands when outside.       The patient does not have symptoms concerning for COVID-19 infection (fever, chills, cough, or new shortness of breath).    Past Medical History:  Diagnosis Date  . Anxiety   . Bradycardia   . CAD (coronary artery disease)    a. 08/2004 NSTEMI/PCI: LAD 44m/d (2.75 x 28 mm Taxus 2 DES);  b. 10/2010 Cath: LM nl, LAD patent stents w/ jailed  septal, LCX 30, RCA 30; c. 06/2013 MV: No ischemia. Small fixed anteroseptal defect, ? scar vs atten. EF 62%; d. 03/2016 MV: EF 57%, Low risk.  . Chronic diastolic heart failure ([place])    a. Echo 7/13:  mod LVH, EF 65-70%, vigorous LV, Gr 1 diast dysfn, mild LAE; b. 05/2017 Echo: EF 60-65%, mod LVH, no rwma, Gr1 DD, mildly dil LA.  Marland Kitchen Chronic pain disorder   . CKD (chronic kidney disease), stage III ([place])   . DDD (degenerative disc disease), lumbosacral    , Spinal stenosis  . Diabetes mellitus ([place])    , Type II  . GERD (gastroesophageal reflux disease)   . HTN (hypertension)   . Hyperlipidemia   . Hypothyroidism   . IBS (irritable  bowel syndrome)   . Lumbar disc disease   . Nonproliferative diabetic retinopathy associated with type 2 diabetes mellitus ([place])   . Obesity (BMI 30-39.9)   . Peripheral neuropathy    , Associated with DM  . Scoliosis    Past Surgical History:  Procedure Laterality Date  . BACK SURGERY    . CARDIAC CATHETERIZATION    . CARDIOVERSION N/A 08/25/2018   Procedure: CARDIOVERSION;  Surgeon: Jerline Pain, MD;  Location[facility] ENDOSCOPY;  Service: Cardiovascular;  Laterality: N/A;  . CHOLECYSTECTOMY    . CORONARY STENT INTERVENTION N/A 03/21/2018   Procedure: CORONARY STENT INTERVENTION;  Surgeon: Jettie Booze, MD;  Location: [place] CV LAB;  Service: Cardiovascular;  Laterality: N/A;  . DILATION AND CURETTAGE OF UTERUS    . HEMILAMINOTOMY LUMBAR SPINE  2002   Rt L5, with decompression and foraminotomy  . INDUCED ABORTION     , x2  . LAMINOTOMY Right   . LEFT HEART CATH AND CORONARY ANGIOGRAPHY N/A 03/21/2018   Procedure: LEFT HEART CATH AND CORONARY ANGIOGRAPHY;  Surgeon: Jettie Booze, MD;  Location: [place] CV LAB;  Service: Cardiovascular;  Laterality: N/A;  . LUMBAR [place] SURGERY  07/1999   L5-S1  . PILONIDAL CYST EXCISION    . ROTATOR CUFF REPAIR    . TONSILLECTOMY    . TOTAL ABDOMINAL HYSTERECTOMY W/ BILATERAL SALPINGOOPHORECTOMY       Current Meds  Medication Sig  . amLODipine (NORVASC) 2.5 MG tablet Take 1 tablet (2.5 mg total) by mouth daily.  Marland Kitchen antiseptic oral rinse (BIOTENE) LIQD 15 mLs by Mouth Rinse route 2 (two) times daily.   Marland Kitchen apixaban (ELIQUIS) 2.5 MG TABS tablet Take 1 tablet (2.5 mg total) by mouth 2 (two) times daily.  . ASPERCREME LIDOCAINE EX Apply 1 application topically 2 (two) times daily as needed (back/feet pain).  . Carboxymethylcellulose Sod PF (RETAINE CMC) 0.5 % SOLN Place 1 drop into both eyes daily.   . clindamycin (CLEOCIN T) 1 % lotion Apply 1 application topically 2 (two) times daily. Facial wash  . clopidogrel (PLAVIX) 75 MG  tablet Take 1 tablet (75 mg total) by mouth daily.  . cycloSPORINE (RESTASIS) 0.05 % ophthalmic emulsion Place 1 drop into both eyes 2 (two) times daily as needed (dry eyes).   Marland Kitchen docusate sodium (COLACE) 100 MG capsule Take 100 mg by mouth daily.  . furosemide (LASIX) 20 MG tablet Take 1 tablet by mouth daily, may take a extra tablet daily only as needed if wt is up to 171  . gabapentin (NEURONTIN) 300 MG capsule Take 300 mg by mouth 2 (two) times daily.   . isosorbide mononitrate (IMDUR) 30 MG 24 hr tablet Take 0.5 tablets (  15 mg total) by mouth daily.  Marland Kitchen levothyroxine (SYNTHROID, LEVOTHROID) 75 MCG tablet Take 75 mcg by mouth daily.  . Melatonin 3 MG TABS Take 3 mg by mouth at bedtime as needed (sleep).  . metFORMIN (GLUCOPHAGE) 500 MG tablet Take 500 mg by mouth 2 (two) times daily with a meal.   . nitroGLYCERIN (NITROSTAT) 0.4 MG SL tablet Place 1 tablet (0.4 mg total) under the tongue every 5 (five) minutes x 3 doses as needed for chest pain.  Marland Kitchen oxyCODONE (OXY IR/ROXICODONE) 5 MG immediate release tablet Take 5 mg by mouth at bedtime.  . Oxycodone HCl 10 MG TABS Take 1 tablet by mouth 4 (four) times daily.  . pantoprazole (PROTONIX) 40 MG tablet Take 1 tablet (40 mg total) by mouth daily.  Marland Kitchen PREMARIN vaginal cream Place 1 Applicatorful vaginally 2 (two) times a week.   . ramipril (ALTACE) 5 MG capsule TAKE 1 CAPSULE(5 MG) BY MOUTH DAILY  . rosuvastatin (CRESTOR) 20 MG tablet Take 1 tablet (20 mg total) by mouth daily at 6 PM.  . sodium chloride (OCEAN) 0.65 % SOLN nasal spray Place 1 spray into both nostrils as needed for congestion (dryness).  . valACYclovir (VALTREX) 500 MG tablet Take 500 mg by mouth 2 (two) times daily.   . vitamin B-12 (CYANOCOBALAMIN) 500 MCG tablet Take 500 mcg by mouth daily.     Allergies:   Pravastatin and Prednisone   Social History   Tobacco Use  . Smoking status: Former Smoker    Years: 35.00  . Smokeless tobacco: Never Used  Substance Use Topics  .  Alcohol use: No    Comment: social drinking, not often  . Drug use: No     Family Hx: The patient's family history includes Coronary artery disease in her mother; Diabetes in her father; Heart failure in her father.  ROS:   Please see the history of present illness.    General:no colds or fevers, no weight changes CV:see HPI PUL:see HPI GI:no diarrhea constipation or melena, no indigestion GU:no hematuria, no dysuria   All other systems reviewed and are negative.   Prior CV studies:   The following studies were reviewed today:  CATH: 03/21/2018:  Prox RCA to Mid RCA lesion is 25% stenosed.  Mid Cx lesion is 90% stenosed.  A drug-eluting stent was successfully placed using a STENT SIERRA 2.75 X 28 MM.  Post intervention, there is a 0% residual stenosis.  Ost Cx to Prox Cx lesion is 50% stenosed.  Previously placed Mid LAD stent (unknown type) is widely patent.  Prox LAD lesion is 25% stenosed.  LV end diastolic pressure is mildly elevated. LVEDP 17 mm Hg.  There is no aortic valve stenosis.  Vasospasm and discomfort in the right arm from the radial approach. Recommend uninterrupted dual antiplatelet therapy with Aspirin 81mg  daily and Clopidogrel 75mg  daily for a minimum of 12 months (ACS - Class I recommendation). Patient was already on Plavix. COuld consdier Brilinta instead if bleeding risk is low.   TTE: 03/19/2018  Left ventricle: The cavity size was normal. There was mild concentric hypertrophy. Systolic function was normal. The estimated ejection fraction was in the range of 60% to 65%. Wall motion was normal; there were no regional wall motion abnormalities. Doppler parameters are consistent with abnormal left ventricular relaxation (grade 1 diastolic dysfunction). Doppler parameters are consistent with elevated ventricular end-diastolic filling pressure. - Aortic valve: There was trivial regurgitation. - Mitral valve: There was mild  regurgitation. -  Left atrium: The atrium was mildly dilated. - Right ventricle: Systolic function was normal. - Tricuspid valve: There was mild regurgitation. - Pulmonic valve: There was no regurgitation. - Pulmonary arteries: Systolic pressure was within the normal range. - Inferior vena cava: The vessel was normal in size. - Pericardium, extracardiac: There was no pericardial effusion   Labs/Other Tests and Data Reviewed:    EKG:  No ECG reviewed.  Recent Labs: 04/27/2018: NT-Pro BNP 2,300 10/29/2018: TSH 1.164 10/30/2018: ALT 15 10/31/2018: Hemoglobin 11.4; Platelets 209 11/21/2018: BUN 35; Creatinine, Ser 1.98; Potassium 4.6; Sodium 142   Recent Lipid Panel Lab Results  Component Value Date/Time   CHOL 123 10/30/2018 03:30 AM   TRIG 104 10/30/2018 03:30 AM   HDL 55 10/30/2018 03:30 AM   CHOLHDL 2.2 10/30/2018 03:30 AM   LDLCALC 47 10/30/2018 03:30 AM    Wt Readings from Last 3 Encounters:  12/15/18 168 lb 9.6 oz (76.5 kg)  12/01/18 169 lb (76.7 kg)  11/10/18 168 lb (76.2 kg)     Objective:    Vital Signs:  Ht 4\' 11"  (1.499 m)   Wt 168 lb 9.6 oz (76.5 kg)   BMI 34.05 kg/m   No BP today VITAL SIGNS:  reviewed  General voice is strong and NAD Neuro A&O X 3  Lungs no SOB with talking  Psych pleasant affect   ASSESSMENT & PLAN:    1. CAD with recent NSTEMI and last PCI 2019. Added Imdur back and she has not had any further angina.  Continue current meds 2. Chronic diastolic HF with recent acute episode.  No edema and no SOB, wt is stable.  3. Persistent a fib on eliquis and no bleeding.  COVID-19 Education: The signs and symptoms of COVID-19 were discussed with the patient and how to seek care for testing (follow up with PCP or arrange E-visit).  The importance of social distancing was discussed today.  Time:   Today, I have spent 5 minutes with the patient with telehealth technology discussing the above problems.     Medication Adjustments/Labs and Tests  Ordered: Current medicines are reviewed at length with the patient today.  Concerns regarding medicines are outlined above.   Tests Ordered: No orders of the defined types were placed in this encounter.   Medication Changes: No orders of the defined types were placed in this encounter.   Disposition:  Follow up in 3 month(s)  Signed, Cecilie Kicks, NP  12/15/2018 1:31 PM    [facility]

## 2018-12-15 NOTE — Patient Instructions (Signed)
Medication Instructions:  Your physician recommends that you continue on your current medications as directed. Please refer to the Current Medication list given to you today. If you need a refill on your cardiac medications before your next appointment, please call your pharmacy.   Lab work: NONE If you have labs (blood work) drawn today and your tests are completely normal, you will receive your results only by: Marland Kitchen MyChart Message (if you have MyChart) OR . A paper copy in the mail If you have any lab test that is abnormal or we need to change your treatment, we will call you to review the results.  Testing/Procedures: NONE  Follow-Up: At [facility], you and your health needs are our priority.  As part of our continuing mission to provide you with exceptional heart care, we have created designated Provider Care Teams.  These Care Teams include your primary Cardiologist (physician) and Advanced Practice Providers (APPs -  Physician Assistants and Nurse Practitioners) who all work together to provide you with the care you need, when you need it. You will need a follow up appointment in 3 months.  You may see [name], MD or one of the following Advanced Practice Providers on your designated Care Team:   Truitt Merle, NP Cecilie Kicks, NP . Kathyrn Drown, NP

## 2018-12-15 NOTE — Telephone Encounter (Signed)

## 2019-01-18 ENCOUNTER — Other Ambulatory Visit: Payer: Self-pay | Admitting: Internal Medicine

## 2019-01-18 DIAGNOSIS — M8580 Other specified disorders of bone density and structure, unspecified site: Secondary | ICD-10-CM

## 2019-01-23 ENCOUNTER — Other Ambulatory Visit: Payer: Self-pay | Admitting: Medical

## 2019-02-10 ENCOUNTER — Other Ambulatory Visit: Payer: Medicare Other

## 2019-03-10 ENCOUNTER — Emergency Department (HOSPITAL_COMMUNITY): Payer: Medicare Other

## 2019-03-10 ENCOUNTER — Encounter (HOSPITAL_COMMUNITY): Payer: Self-pay | Admitting: Emergency Medicine

## 2019-03-10 ENCOUNTER — Emergency Department (HOSPITAL_COMMUNITY)
Admission: EM | Admit: 2019-03-10 | Discharge: 2019-03-10 | Disposition: A | Discharge status: Home / Self Care | Payer: Medicare Other | Attending: Emergency Medicine | Admitting: Emergency Medicine

## 2019-03-10 DIAGNOSIS — E1122 Type 2 diabetes mellitus with diabetic chronic kidney disease: Secondary | ICD-10-CM | POA: Diagnosis not present

## 2019-03-10 DIAGNOSIS — Z7984 Long term (current) use of oral hypoglycemic drugs: Secondary | ICD-10-CM | POA: Diagnosis not present

## 2019-03-10 DIAGNOSIS — I252 Old myocardial infarction: Secondary | ICD-10-CM | POA: Insufficient documentation

## 2019-03-10 DIAGNOSIS — Z7901 Long term (current) use of anticoagulants: Secondary | ICD-10-CM | POA: Diagnosis not present

## 2019-03-10 DIAGNOSIS — I5032 Chronic diastolic (congestive) heart failure: Secondary | ICD-10-CM | POA: Diagnosis not present

## 2019-03-10 DIAGNOSIS — Z79899 Other long term (current) drug therapy: Secondary | ICD-10-CM | POA: Diagnosis not present

## 2019-03-10 DIAGNOSIS — N183 Chronic kidney disease, stage 3 (moderate): Secondary | ICD-10-CM | POA: Insufficient documentation

## 2019-03-10 DIAGNOSIS — R0602 Shortness of breath: Secondary | ICD-10-CM | POA: Diagnosis present

## 2019-03-10 DIAGNOSIS — N3000 Acute cystitis without hematuria: Secondary | ICD-10-CM | POA: Insufficient documentation

## 2019-03-10 DIAGNOSIS — I130 Hypertensive heart and chronic kidney disease with heart failure and stage 1 through stage 4 chronic kidney disease, or unspecified chronic kidney disease: Secondary | ICD-10-CM | POA: Insufficient documentation

## 2019-03-10 DIAGNOSIS — M545 Low back pain, unspecified: Secondary | ICD-10-CM

## 2019-03-10 DIAGNOSIS — R600 Localized edema: Secondary | ICD-10-CM | POA: Insufficient documentation

## 2019-03-10 DIAGNOSIS — Z87891 Personal history of nicotine dependence: Secondary | ICD-10-CM | POA: Insufficient documentation

## 2019-03-10 DIAGNOSIS — E039 Hypothyroidism, unspecified: Secondary | ICD-10-CM | POA: Insufficient documentation

## 2019-03-10 DIAGNOSIS — I509 Heart failure, unspecified: Secondary | ICD-10-CM

## 2019-03-10 DIAGNOSIS — Z20828 Contact with and (suspected) exposure to other viral communicable diseases: Secondary | ICD-10-CM | POA: Diagnosis not present

## 2019-03-10 LAB — URINALYSIS, ROUTINE W REFLEX MICROSCOPIC
Bilirubin Urine: NEGATIVE
Glucose, UA: NEGATIVE mg/dL
Ketones, ur: NEGATIVE mg/dL
Nitrite: NEGATIVE
Protein, ur: NEGATIVE mg/dL
Specific Gravity, Urine: 1.008 (ref 1.005–1.030)
pH: 6 (ref 5.0–8.0)

## 2019-03-10 LAB — COMPREHENSIVE METABOLIC PANEL
ALT: 14 U/L (ref 0–44)
AST: 22 U/L (ref 15–41)
Albumin: 3.4 g/dL — ABNORMAL LOW (ref 3.5–5.0)
Alkaline Phosphatase: 97 U/L (ref 38–126)
Anion gap: 8 (ref 5–15)
BUN: 30 mg/dL — ABNORMAL HIGH (ref 8–23)
CO2: 22 mmol/L (ref 22–32)
Calcium: 10.7 mg/dL — ABNORMAL HIGH (ref 8.9–10.3)
Chloride: 104 mmol/L (ref 98–111)
Creatinine, Ser: 1.64 mg/dL — ABNORMAL HIGH (ref 0.44–1.00)
GFR calc Af Amer: 32 mL/min — ABNORMAL LOW (ref >60)
GFR calc non Af Amer: 28 mL/min — ABNORMAL LOW (ref >60)
Glucose, Bld: 100 mg/dL — ABNORMAL HIGH (ref 70–99)
Potassium: 4.5 mmol/L (ref 3.5–5.1)
Sodium: 134 mmol/L — ABNORMAL LOW (ref 135–145)
Total Bilirubin: 0.5 mg/dL (ref 0.3–1.2)
Total Protein: 6.2 g/dL — ABNORMAL LOW (ref 6.5–8.1)

## 2019-03-10 LAB — CBC WITH DIFFERENTIAL/PLATELET
Abs Immature Granulocytes: 0.04 10*3/uL (ref 0.00–0.07)
Basophils Absolute: 0.1 10*3/uL (ref 0.0–0.1)
Basophils Relative: 1 %
Eosinophils Absolute: 0.7 10*3/uL — ABNORMAL HIGH (ref 0.0–0.5)
Eosinophils Relative: 8 %
HCT: 34.6 % — ABNORMAL LOW (ref 36.0–46.0)
Hemoglobin: 11.1 g/dL — ABNORMAL LOW (ref 12.0–15.0)
Immature Granulocytes: 1 %
Lymphocytes Relative: 24 %
Lymphs Abs: 1.9 10*3/uL (ref 0.7–4.0)
MCH: 32.2 pg (ref 26.0–34.0)
MCHC: 32.1 g/dL (ref 30.0–36.0)
MCV: 100.3 fL — ABNORMAL HIGH (ref 80.0–100.0)
Monocytes Absolute: 0.7 10*3/uL (ref 0.1–1.0)
Monocytes Relative: 9 %
Neutro Abs: 4.8 10*3/uL (ref 1.7–7.7)
Neutrophils Relative %: 57 %
Platelets: 250 10*3/uL (ref 150–400)
RBC: 3.45 MIL/uL — ABNORMAL LOW (ref 3.87–5.11)
RDW: 14.8 % (ref 11.5–15.5)
WBC: 8.2 10*3/uL (ref 4.0–10.5)
nRBC: 0 % (ref 0.0–0.2)

## 2019-03-10 LAB — BRAIN NATRIURETIC PEPTIDE: B Natriuretic Peptide: 376.4 pg/mL — ABNORMAL HIGH (ref 0.0–100.0)

## 2019-03-10 LAB — TROPONIN I (HIGH SENSITIVITY): Troponin I (High Sensitivity): 9 ng/L (ref <18)

## 2019-03-10 LAB — SARS CORONAVIRUS 2 BY RT PCR (HOSPITAL ORDER, PERFORMED IN ~~LOC~~ HOSPITAL LAB): SARS Coronavirus 2: NEGATIVE

## 2019-03-10 MED ORDER — OXYCODONE HCL 10 MG PO TABS: 10.0000 mg | ORAL_TABLET | Freq: Four times a day (QID) | ORAL | 0 refills | Status: DC

## 2019-03-10 MED ORDER — OXYCODONE HCL 5 MG PO TABS
5.0000 mg | ORAL_TABLET | Freq: Once | ORAL | Status: AC
  Administered 2019-03-10: 5 mg via ORAL
  Filled 2019-03-10: qty 1

## 2019-03-10 MED ORDER — FUROSEMIDE 10 MG/ML IJ SOLN
20.0000 mg | Freq: Once | INTRAMUSCULAR | Status: AC
  Administered 2019-03-10: 20:00:00 20 mg via INTRAVENOUS
  Filled 2019-03-10: qty 2

## 2019-03-10 MED ORDER — SODIUM CHLORIDE 0.9 % IV SOLN
1.0000 g | Freq: Once | INTRAVENOUS | Status: AC
  Administered 2019-03-10: 1 g via INTRAVENOUS
  Filled 2019-03-10: qty 10

## 2019-03-10 MED ORDER — FUROSEMIDE 20 MG PO TABS: 20.0000 mg | ORAL_TABLET | Freq: Every day | ORAL | 0 refills | Status: DC

## 2019-03-10 MED ORDER — CEPHALEXIN 500 MG PO CAPS: 500.0000 mg | ORAL_CAPSULE | Freq: Three times a day (TID) | ORAL | 0 refills | Status: DC

## 2019-03-10 NOTE — Discharge Instructions (Addendum)
Increase lasix to 40 mg daily x 3 days then back down to 20 mg daily   Take your oxycodone as prescribed. You will need to get your monthly prescription by your doctor  See your doctor and heart failure clinic   Take keflex three times daily for UTI.   Avoid salty food   Return to ER if you have worse shortness of breath, leg swelling, back pain, numbness, weakness

## 2019-03-10 NOTE — ED Provider Notes (Signed)
[facility] Provider Note   CSN: [id number] Arrival date & time: 03/10/19  1720    History   Chief Complaint Chief Complaint  Patient presents with  . Shortness of Breath    HPI DESTRY BOURDEAU is a 85 y.o. female hx of CAD, CHF, CKD, DM, GERD here with shortness of breath, leg swelling.  Patient states that she gained about 4 pounds since yesterday.  She has some subjective shortness of breath when she lays down and has to lay down on 2 pillows.  She states that she usually takes 20 mg of Lasix daily and took an extra dose so took 40 mg of Lasix today.  She states that she is urinating frequently but still has shortness of breath so came here for evaluation.  She denies any fevers or chills or history of COVID exposure.      The history is provided by the patient.    Past Medical History:  Diagnosis Date  . Anxiety   . Bradycardia   . CAD (coronary artery disease)    a. 08/2004 NSTEMI/PCI: LAD 68m/d (2.75 x 28 mm Taxus 2 DES);  b. 10/2010 Cath: LM nl, LAD patent stents w/ jailed septal, LCX 30, RCA 30; c. 06/2013 MV: No ischemia. Small fixed anteroseptal defect, ? scar vs atten. EF 62%; d. 03/2016 MV: EF 57%, Low risk.  . Chronic diastolic heart failure ([place])    a. Echo 7/13:  mod LVH, EF 65-70%, vigorous LV, Gr 1 diast dysfn, mild LAE; b. 05/2017 Echo: EF 60-65%, mod LVH, no rwma, Gr1 DD, mildly dil LA.  Marland Kitchen Chronic pain disorder   . CKD (chronic kidney disease), stage III ([place])   . DDD (degenerative disc disease), lumbosacral    , Spinal stenosis  . Diabetes mellitus ([place])    , Type II  . GERD (gastroesophageal reflux disease)   . HTN (hypertension)   . Hyperlipidemia   . Hypothyroidism   . IBS (irritable bowel syndrome)   . Lumbar disc disease   . Nonproliferative diabetic retinopathy associated with type 2 diabetes mellitus ([place])   . Obesity (BMI 30-39.9)   . Peripheral neuropathy    , Associated with DM  . Scoliosis     Patient  Active Problem List   Diagnosis Date Noted  . AKI (acute kidney injury) ([place])   . Unstable angina ([place]) 10/29/2018  . Persistent atrial fibrillation   . Status post coronary artery stent placement   . NSTEMI (non-ST elevated myocardial infarction) ([place]) 03/18/2018  . Essential hypertension 05/19/2016  . Chest pain 05/18/2016  . Bradycardia 09/26/2014  . Sinus bradycardia 10/30/2013  . Fatigue 10/30/2013  . Acute on chronic diastolic heart failure ([place]) 06/26/2013  . CAD (coronary artery disease) 02/12/2012  . Hypercalcemia 02/12/2012  . Chronic pain disorder   . Hypothyroidism   . Type 2 diabetes mellitus with vascular disease ([place])   . Hyperlipidemia   . Lumbar disc disease   . Obesity (BMI 30-39.9)   . Hypertensive heart disease without CHF   . Bipolar affective disorder ([place])   . Scoliosis     Past Surgical History:  Procedure Laterality Date  . BACK SURGERY    . CARDIAC CATHETERIZATION    . CARDIOVERSION N/A 08/25/2018   Procedure: CARDIOVERSION;  Surgeon: Jerline Pain, MD;  Location: [facility] ENDOSCOPY;  Service: Cardiovascular;  Laterality: N/A;  . CHOLECYSTECTOMY    . CORONARY STENT INTERVENTION N/A 03/21/2018   Procedure: CORONARY STENT  INTERVENTION;  Surgeon: Jettie Booze, MD;  Location: [facility] CV LAB;  Service: Cardiovascular;  Laterality: N/A;  . DILATION AND CURETTAGE OF UTERUS    . HEMILAMINOTOMY LUMBAR SPINE  2002   Rt L5, with decompression and foraminotomy  . INDUCED ABORTION     , x2  . LAMINOTOMY Right   . LEFT HEART CATH AND CORONARY ANGIOGRAPHY N/A 03/21/2018   Procedure: LEFT HEART CATH AND CORONARY ANGIOGRAPHY;  Surgeon: Jettie Booze, MD;  Location: [facility];  Service: Cardiovascular;  Laterality: N/A;  . LUMBAR [place] SURGERY  07/1999   L5-S1  . PILONIDAL CYST EXCISION    . ROTATOR CUFF REPAIR    . TONSILLECTOMY    . TOTAL ABDOMINAL HYSTERECTOMY W/ BILATERAL SALPINGOOPHORECTOMY       OB History   No obstetric history on  file.      Home Medications    Prior to Admission medications   Medication Sig Start Date End Date Taking? Authorizing Provider  amLODipine (NORVASC) 2.5 MG tablet Take 1 tablet (2.5 mg total) by mouth daily. 11/01/18   Jerline Pain, MD  antiseptic oral rinse (BIOTENE) LIQD 15 mLs by Mouth Rinse route 2 (two) times daily.     [provider]  apixaban (ELIQUIS) 2.5 MG TABS tablet Take 1 tablet (2.5 mg total) by mouth 2 (two) times daily. 10/31/18   [facility], Tami Lin, PA  ASPERCREME LIDOCAINE EX Apply 1 application topically 2 (two) times daily as needed (back/feet pain).    [provider]  Carboxymethylcellulose Sod PF (RETAINE CMC) 0.5 % SOLN Place 1 drop into both eyes daily.     [provider]  clindamycin (CLEOCIN T) 1 % lotion Apply 1 application topically 2 (two) times daily. Facial wash 11/16/13   [provider]  clopidogrel (PLAVIX) 75 MG tablet Take 1 tablet (75 mg total) by mouth daily. 03/23/18   Kroeger, Lorelee Cover., PA-C  cycloSPORINE (RESTASIS) 0.05 % ophthalmic emulsion Place 1 drop into both eyes 2 (two) times daily as needed (dry eyes).     [provider]  docusate sodium (COLACE) 100 MG capsule Take 100 mg by mouth daily.    [provider]  furosemide (LASIX) 20 MG tablet Take 1 tablet by mouth daily, may take a extra tablet daily only as needed if wt is up to 171 12/01/18   Isaiah Serge, NP  gabapentin (NEURONTIN) 300 MG capsule Take 300 mg by mouth 2 (two) times daily.     [provider]  isosorbide mononitrate (IMDUR) 30 MG 24 hr tablet Take 0.5 tablets (15 mg total) by mouth daily. 12/01/18 03/01/19  Isaiah Serge, NP  levothyroxine (SYNTHROID, LEVOTHROID) 75 MCG tablet Take 75 mcg by mouth daily.    [provider]  Melatonin 3 MG TABS Take 3 mg by mouth at bedtime as needed (sleep).    [provider]  metFORMIN (GLUCOPHAGE) 500 MG tablet Take 500 mg by mouth 2 (two) times daily with  a meal.  03/17/16   [provider]  nitroGLYCERIN (NITROSTAT) 0.4 MG SL tablet DISSOLVE 1 TABLET UNDER THE TONGUE EVERY 5 MINUTES FOR 3 DOSES AS NEEDED FOR CHEST PAIN 01/23/19   Reino Bellis B, NP  oxyCODONE (OXY IR/ROXICODONE) 5 MG immediate release tablet Take 5 mg by mouth at bedtime. 10/19/18   [provider]  Oxycodone HCl 10 MG TABS Take 1 tablet by mouth 4 (four) times daily. 05/22/15   [provider]  pantoprazole (PROTONIX) 40 MG tablet Take 1 tablet (40 mg total) by mouth daily. 03/23/18   Kroeger, Lorelee Cover., PA-C  PREMARIN vaginal cream Place 1 Applicatorful vaginally 2 (two) times a week.  10/06/13   [provider]  ramipril (ALTACE) 5 MG capsule TAKE 1 CAPSULE(5 MG) BY MOUTH DAILY 07/19/18   Kroeger, Daleen Snook M., PA-C  rosuvastatin (CRESTOR) 20 MG tablet Take 1 tablet (20 mg total) by mouth daily at 6 PM. 03/22/18   Kroeger, Lorelee Cover., PA-C  sodium chloride (OCEAN) 0.65 % SOLN nasal spray Place 1 spray into both nostrils as needed for congestion (dryness).    [provider]  valACYclovir (VALTREX) 500 MG tablet Take 500 mg by mouth 2 (two) times daily.     [provider]  vitamin B-12 (CYANOCOBALAMIN) 500 MCG tablet Take 500 mcg by mouth daily.    [provider]    Family History Family History  Problem Relation Age of Onset  . Heart failure Father   . Diabetes Father   . Coronary artery disease Mother     Social History Social History   Tobacco Use  . Smoking status: Former Smoker    Years: 35.00  . Smokeless tobacco: Never Used  Substance Use Topics  . Alcohol use: No    Comment: social drinking, not often  . Drug use: No     Allergies   Pravastatin and Prednisone   Review of Systems Review of Systems  Respiratory: Positive for shortness of breath.   All other systems reviewed and are negative.    Physical Exam Updated Vital Signs BP (!) 135/58   Pulse 70   Resp 14   SpO2 97%    Physical Exam Vitals signs reviewed.  Constitutional:      Comments: Chronically ill, slightly tachypneic   HENT:     Head: Normocephalic.  Eyes:     Pupils: Pupils are equal, round, and reactive to light.  Neck:     Musculoskeletal: Normal range of motion.  Cardiovascular:     Rate and Rhythm: Normal rate and regular rhythm.  Pulmonary:     Effort: Pulmonary effort is normal.     Comments: Crackles bilaterally  Abdominal:     General: Bowel sounds are normal.     Palpations: Abdomen is soft.  Musculoskeletal: Normal range of motion.     Right lower leg: Edema present.     Left lower leg: Edema present.  Skin:    General: Skin is warm.     Capillary Refill: Capillary refill takes less than 2 seconds.  Neurological:     General: No focal deficit present.     Mental Status: She is alert and oriented to person, place, and time.  Psychiatric:        Mood and Affect: Mood normal.        Behavior: Behavior normal.      ED Treatments / Results  Labs (all labs ordered are listed, but only abnormal results are displayed) Labs Reviewed  CBC WITH DIFFERENTIAL/PLATELET - Abnormal; Notable for the following components:      Result Value   RBC 3.45 (*)    Hemoglobin 11.1 (*)    HCT 34.6 (*)    MCV 100.3 (*)    Eosinophils Absolute 0.7 (*)    All other components within normal limits  COMPREHENSIVE METABOLIC PANEL - Abnormal; Notable for the following components:   Sodium 134 (*)    Glucose, Bld 100 (*)  BUN 30 (*)    Creatinine, Ser 1.64 (*)    Calcium 10.7 (*)    Total Protein 6.2 (*)    Albumin 3.4 (*)    GFR calc non Af Amer 28 (*)    GFR calc Af Amer 32 (*)    All other components within normal limits  BRAIN NATRIURETIC PEPTIDE - Abnormal; Notable for the following components:   B Natriuretic Peptide 376.4 (*)    All other components within normal limits  URINALYSIS, ROUTINE W REFLEX MICROSCOPIC - Abnormal; Notable for the following components:   APPearance HAZY  (*)    Hgb urine dipstick SMALL (*)    Leukocytes,Ua LARGE (*)    Bacteria, UA RARE (*)    All other components within normal limits  SARS CORONAVIRUS 2 (HOSPITAL ORDER, [place] [facility])  TROPONIN I (HIGH SENSITIVITY)  TROPONIN I (HIGH SENSITIVITY)    EKG None  Radiology Dg Chest Port 1 View  Result Date: 03/10/2019 CLINICAL DATA:  Worsening shortness of breath for 2 weeks. EXAM: PORTABLE CHEST 1 VIEW COMPARISON:  October 29, 2018 FINDINGS: The cardiac silhouette is mildly enlarged. Question pulmonary vascular congestion. There is no evidence of focal airspace consolidation, pleural effusion or pneumothorax. Osseous structures are without acute abnormality. Soft tissues are grossly normal. IMPRESSION: 1. Mildly enlarged cardiac silhouette. 2. Question pulmonary vascular congestion. Electronically Signed   By: Fidela Salisbury M.D.   On: 03/10/2019 18:03    Procedures Procedures (including critical care time)  Medications Ordered in ED Medications  oxyCODONE (Oxy IR/ROXICODONE) immediate release tablet 5 mg (has no administration in time range)  furosemide (LASIX) injection 20 mg (20 mg Intravenous Given 03/10/19 1947)     Initial Impression / Assessment and Plan / ED Course  I have reviewed the triage vital signs and the nursing notes.  Pertinent labs & imaging results that were available during my care of the patient were reviewed by me and considered in my medical decision making (see chart for details).       JULIUS SANTODOMINGO is a 85 y.o. female here with SOB, leg swelling, weight gain. Likely CHF exacerbation. Will get labs, BNP, CXR. Will likely need diuresis .   8:36 PM Labs unremarkable. BNP 360. UA ? UTI. Given rocephin. Given lasix 20 mg IV. Patient ambulated with O2 around 96% on RA and doesn't appear tachypneic.  She states that she has been eating some more salty food and I told her to cut out salt in her diet.  Will increase Lasix to 40 mg daily  for the next 3 days. She did run out of her oxycodone several days earlier for chronic back pain and will give several pills to go home with.    Final Clinical Impressions(s) / ED Diagnoses   Final diagnoses:  None    ED Discharge Orders    None       Drenda Freeze, MD 03/10/19 2038

## 2019-03-10 NOTE — ED Triage Notes (Signed)
Pt here from home with c/o sob and controlled afib , pt states that she has gained 3 lbs over the last 2 days and she took 40 of lasix of Po per her MD

## 2019-03-10 NOTE — ED Notes (Signed)
Patient verbalizes understanding of discharge instructions. Opportunity for questioning and answers were provided. Armband removed by staff, pt discharged from ED.  

## 2019-03-24 ENCOUNTER — Other Ambulatory Visit: Payer: Self-pay | Admitting: Medical

## 2019-05-25 ENCOUNTER — Other Ambulatory Visit: Payer: Self-pay | Admitting: Medical

## 2019-06-08 ENCOUNTER — Other Ambulatory Visit: Payer: Self-pay

## 2019-06-08 DIAGNOSIS — Z20822 Contact with and (suspected) exposure to covid-19: Secondary | ICD-10-CM

## 2019-06-09 LAB — NOVEL CORONAVIRUS, NAA: SARS-CoV-2, NAA: NOT DETECTED

## 2019-07-06 ENCOUNTER — Other Ambulatory Visit: Payer: Self-pay

## 2019-07-06 ENCOUNTER — Encounter: Payer: Self-pay | Admitting: Cardiology

## 2019-07-06 ENCOUNTER — Ambulatory Visit: Payer: Medicare Other | Admitting: Cardiology

## 2019-07-06 VITALS — BP 110/60 | HR 94 | Ht 59.0 in | Wt 170.0 lb

## 2019-07-06 DIAGNOSIS — I2510 Atherosclerotic heart disease of native coronary artery without angina pectoris: Secondary | ICD-10-CM

## 2019-07-06 DIAGNOSIS — I4819 Other persistent atrial fibrillation: Secondary | ICD-10-CM | POA: Diagnosis not present

## 2019-07-06 DIAGNOSIS — I209 Angina pectoris, unspecified: Secondary | ICD-10-CM

## 2019-07-06 MED ORDER — ISOSORBIDE MONONITRATE ER 30 MG PO TB24: 30.0000 mg | ORAL_TABLET | Freq: Every day | ORAL | 3 refills | Status: DC

## 2019-07-06 NOTE — Patient Instructions (Signed)
Medication Instructions:  Please increase your Isosorbide to 30 mg by mouth daily. Continue all other medications as listed.  *If you need a refill on your cardiac medications before your next appointment, please call your pharmacy*  Follow-Up: At [facility], you and your health needs are our priority.  As part of our continuing mission to provide you with exceptional heart care, we have created designated Provider Care Teams.  These Care Teams include your primary Cardiologist (physician) and Advanced Practice Providers (APPs -  Physician Assistants and Nurse Practitioners) who all work together to provide you with the care you need, when you need it.  Your next appointment:   6 month(s)  The format for your next appointment:   Virtual Visit   Provider:   Cecilie Kicks, NP  Thank you for choosing [facility]!!

## 2019-07-06 NOTE — Progress Notes (Signed)
Cardiology Office Note:    Date:  07/06/2019   ID:  Kristine Oliver, Kristine Oliver 07-Nov-1930, MRN [id number]  PCP:  Lavone Orn, MD  Cardiologist:  [name], MD  Electrophysiologist:  None   Referring MD: Lavone Orn, MD     History of Present Illness:    Kristine Oliver is a 85 y.o. female here for follow-up of newly discovered atrial fibrillation while in cardiac rehab 07/22/2018, CAD, catheterization 03/21/2018, DES mid circumflex, Plavix aspirin normal EF.  After stent, went to the emergency department with constipation impaction.  Following this 1 month later went back to the emergency department chest pain, troponins negative.  Trying to walk [employer] when she can otherwise.  Lives in a condominium with her nephew who is disabled at age 56.  Reminded me that she enjoys watching the deer out of her backyard.  She feels a little bit anxious by this diagnosis but no chest discomfort no shortness of breath.  She feels some fatigue.  She is convinced that is from her atrial fibrillation.  Note, she wanted me to take bipolar disorder off of her past medical history.  She states that she has anxiety only.  09/21/2018-she is here for follow-up of persistent atrial fibrillation following cardioversion on 08/25/2018.  She still feels terrible, cramps in her legs feet and torso.  Continues taking her Eliquis and having some bruising especially since he is taking Plavix as well.  Recent cardiac catheterization and August 2019.  08/02/2019-here for the follow-up of coronary artery disease as well as persistent atrial fibrillation.  Overall no changes no major complaints.  Has had cramping issues in the past.  Leg pain at night, hard to sleep. Mild bruising noted with Eliquis as well as Plavix.  Denies any fevers chills nausea vomiting syncope bleeding. Increased imdur to 30. If forgets Imdur she hurts. Been having angina. Still eating cookies. Lost 50 pounds.  Her nephew lives with her, her  roommate.  Past Medical History:  Diagnosis Date   Anxiety    Bradycardia    CAD (coronary artery disease)    a. 08/2004 NSTEMI/PCI: LAD 66m/d (2.75 x 28 mm Taxus 2 DES);  b. 10/2010 Cath: LM nl, LAD patent stents w/ jailed septal, LCX 30, RCA 30; c. 06/2013 MV: No ischemia. Small fixed anteroseptal defect, ? scar vs atten. EF 62%; d. 03/2016 MV: EF 57%, Low risk.   Chronic diastolic heart failure ([place])    a. Echo 7/13:  mod LVH, EF 65-70%, vigorous LV, Gr 1 diast dysfn, mild LAE; b. 05/2017 Echo: EF 60-65%, mod LVH, no rwma, Gr1 DD, mildly dil LA.   Chronic pain disorder    CKD (chronic kidney disease), stage III    DDD (degenerative disc disease), lumbosacral    , Spinal stenosis   Diabetes mellitus (HCC)    , Type II   GERD (gastroesophageal reflux disease)    HTN (hypertension)    Hyperlipidemia    Hypothyroidism    IBS (irritable bowel syndrome)    Lumbar disc disease    Nonproliferative diabetic retinopathy associated with type 2 diabetes mellitus (HCC)    Obesity (BMI 30-39.9)    Peripheral neuropathy    , Associated with DM   Scoliosis     Past Surgical History:  Procedure Laterality Date   BACK SURGERY     CARDIAC CATHETERIZATION     CARDIOVERSION N/A 08/25/2018   Procedure: CARDIOVERSION;  Surgeon: Jerline Pain, MD;  Location: [facility];  Service: Cardiovascular;  Laterality: N/A;   CHOLECYSTECTOMY     CORONARY STENT INTERVENTION N/A 03/21/2018   Procedure: CORONARY STENT INTERVENTION;  Surgeon: Jettie Booze, MD;  Location: [facility];  Service: Cardiovascular;  Laterality: N/A;   DILATION AND CURETTAGE OF UTERUS     HEMILAMINOTOMY LUMBAR SPINE  2002   Rt L5, with decompression and foraminotomy   INDUCED ABORTION     , x2   LAMINOTOMY Right    LEFT HEART CATH AND CORONARY ANGIOGRAPHY N/A 03/21/2018   Procedure: LEFT HEART CATH AND CORONARY ANGIOGRAPHY;  Surgeon: Jettie Booze, MD;  Location: [facility];   Service: Cardiovascular;  Laterality: N/A;   LUMBAR DISC SURGERY  07/1999   L5-S1   PILONIDAL CYST EXCISION     ROTATOR CUFF REPAIR     TONSILLECTOMY     TOTAL ABDOMINAL HYSTERECTOMY W/ BILATERAL SALPINGOOPHORECTOMY      Current Medications: Current Meds  Medication Sig   amLODipine (NORVASC) 2.5 MG tablet Take 1 tablet (2.5 mg total) by mouth daily.   antiseptic oral rinse (BIOTENE) LIQD 15 mLs by Mouth Rinse route 2 (two) times daily.    apixaban (ELIQUIS) 2.5 MG TABS tablet Take 1 tablet (2.5 mg total) by mouth 2 (two) times daily.   ASPERCREME LIDOCAINE EX Apply 1 application topically 2 (two) times daily as needed (back/feet pain).   Carboxymethylcellulose Sod PF (RETAINE CMC) 0.5 % SOLN Place 1 drop into both eyes daily.    clindamycin (CLEOCIN T) 1 % lotion Apply 1 application topically 2 (two) times daily. Facial wash   clopidogrel (PLAVIX) 75 MG tablet Take 1 tablet (75 mg total) by mouth daily.   cycloSPORINE (RESTASIS) 0.05 % ophthalmic emulsion Place 1 drop into both eyes 2 (two) times daily as needed (dry eyes).    docusate sodium (COLACE) 100 MG capsule Take 100 mg by mouth daily.   furosemide (LASIX) 20 MG tablet Take 1 tablet by mouth daily, may take a extra tablet daily only as needed if wt is up to 171   furosemide (LASIX) 20 MG tablet Take 1 tablet (20 mg total) by mouth daily.   gabapentin (NEURONTIN) 300 MG capsule Take 300 mg by mouth 2 (two) times daily.    levothyroxine (SYNTHROID, LEVOTHROID) 75 MCG tablet Take 75 mcg by mouth daily.   Melatonin 3 MG TABS Take 3 mg by mouth at bedtime as needed (sleep).   metFORMIN (GLUCOPHAGE) 500 MG tablet Take 500 mg by mouth 2 (two) times daily with a meal.    nitroGLYCERIN (NITROSTAT) 0.4 MG SL tablet DISSOLVE 1 TABLET UNDER THE TONGUE EVERY 5 MINUTES FOR 3 DOSES AS NEEDED FOR CHEST PAIN   oxyCODONE (OXY IR/ROXICODONE) 5 MG immediate release tablet Take 5 mg by mouth at bedtime.   Oxycodone HCl 10 MG  TABS Take 1 tablet (10 mg total) by mouth 4 (four) times daily.   pantoprazole (PROTONIX) 40 MG tablet TAKE 1 TABLET(40 MG) BY MOUTH DAILY   PREMARIN vaginal cream Place 1 Applicatorful vaginally 2 (two) times a week.    ramipril (ALTACE) 5 MG capsule TAKE 1 CAPSULE(5 MG) BY MOUTH DAILY   rosuvastatin (CRESTOR) 20 MG tablet TAKE 1 TABLET(20 MG) BY MOUTH DAILY AT 6 PM   sodium chloride (OCEAN) 0.65 % SOLN nasal spray Place 1 spray into both nostrils as needed for congestion (dryness).   valACYclovir (VALTREX) 500 MG tablet Take 500 mg by mouth 2 (two) times daily.  vitamin B-12 (CYANOCOBALAMIN) 500 MCG tablet Take 500 mcg by mouth daily.     Allergies:   Pravastatin and Prednisone   Social History   Socioeconomic History   Marital status: Divorced    Spouse name: Not on file   Number of children: Not on file   Years of education: Not on file   Highest education level: Not on file  Occupational History   Occupation: retired  Scientist, product/process development strain: Not on file   Food insecurity    Worry: Not on file    Inability: Not on Lexicographer needs    Medical: Not on file    Non-medical: Not on file  Tobacco Use   Smoking status: Former Smoker    Years: 35.00   Smokeless tobacco: Never Used  Substance and Sexual Activity   Alcohol use: No    Comment: social drinking, not often   Drug use: No   Sexual activity: Not on file  Lifestyle   Physical activity    Days per week: Not on file    Minutes per session: Not on file   Stress: Not on file  Relationships   Social connections    Talks on phone: Not on file    Gets together: Not on file    Attends religious service: Not on file    Active member of club or organization: Not on file    Attends meetings of clubs or organizations: Not on file    Relationship status: Not on file  Other Topics Concern   Not on file  Social History Narrative   Divorced.  Lives with her nephew in  [place].  She walks most days of the week.     Family History: The patient's family history includes Coronary artery disease in her mother; Diabetes in her father; Heart failure in her father.  ROS:   Please see the history of present illness.      EKGs/Labs/Other Studies Reviewed:    The following studies were reviewed today: Cardiac rehab notes, office notes, lab work EKG  Cardiac catheterization 03/21/2018: Diagnostic  Dominance: Right    Intervention      EKG: EKG from 07/22/2018 shows atrial fibrillation heart rate 87 bpm personally reviewed and interpreted.  EKG is ordered today.  The ekg ordered today demonstrates 08/04/2018-A. fib 76 nonspecific ST-T wave changes.  Recent Labs: 10/29/2018: TSH 1.164 03/10/2019: ALT 14; B Natriuretic Peptide 376.4; BUN 30; Creatinine, Ser 1.64; Hemoglobin 11.1; Platelets 250; Potassium 4.5; Sodium 134  Recent Lipid Panel    Component Value Date/Time   CHOL 123 10/30/2018 0330   TRIG 104 10/30/2018 0330   HDL 55 10/30/2018 0330   CHOLHDL 2.2 10/30/2018 0330   VLDL 21 10/30/2018 0330   LDLCALC 47 10/30/2018 0330    Physical Exam:    VS:  BP 110/60    Pulse 94    Ht 4\' 11"  (1.499 m)    Wt 170 lb (77.1 kg)    SpO2 96%    BMI 34.34 kg/m     Wt Readings from Last 3 Encounters:  07/06/19 170 lb (77.1 kg)  12/15/18 168 lb 9.6 oz (76.5 kg)  12/01/18 169 lb (76.7 kg)     GEN: Elderly, using walker well nourished, well developed, in no acute distress  HEENT: normal  Neck: no JVD, carotid bruits, or masses Cardiac: RRR; no murmurs, rubs, or gallops,no edema  Respiratory:  clear to auscultation bilaterally, normal work of  breathing GI: soft, nontender, nondistended, + BS MS: no deformity or atrophy  Skin: warm and dry, no rash Neuro:  Alert and Oriented x 3, Strength and sensation are intact Psych: euthymic mood, full affect    ASSESSMENT:    1. Coronary artery disease involving native coronary artery of native heart without  angina pectoris   2. Angina pectoris ([place])   3. Persistent atrial fibrillation ([place])    PLAN:    In order of problems listed above:  Longstanding persistent atrial fibrillation -Recently in the end of December 2019 she was noted to be in atrial fibrillation and we set her up for cardioversion on 08/25/2018.  This was successful at that time. - On 07/29/2018 she returned to cardiac rehab but was noted to remain in atrial fibrillation.  Vitals stable.  Heart rate was in the 80s.  Anticoagulation was started with Eliquis on 07/22/2018 but now taking BID from 08/03/18, aspirin was stopped.  She remains on Plavix.  -Still feels fatigued and tired.  Upon check of EKG on 09/21/2017 on follow-up she is back in atrial fibrillation with good rate control.  Prior hemoglobin was stable at 11.1.  She is off of her estrogen supplement as well.  Discussed the affirm trial with her.  We will go ahead and continue with rate control.  I am concerned that if we used amiodarone that she would end up with a pacemaker and feel worse. -No changes made   CAD - Recent stent October 2019.  Continue with Plavix given her prior early generation DES.  Has been describing some possible anginal symptoms chest and neck.  We will increase her isosorbide to 30 mg.  She states when she does not take her 15 mg of isosorbide, prior dose, she would feel more pain.  Sinus bradycardia -Has seen EP, Dr. Rayann Heman for pacemaker consultation.  Mild first-degree AV block.  Did not wish to go forward with pacemaker.  Avoiding AV nodal blocking agents.  Overall doing well.  Continuing to be stable.   Diabetes with hypertension -Metformin, doing well, continuing with Norvasc.  No changes made once again.  Medication Adjustments/Labs and Tests Ordered: Current medicines are reviewed at length with the patient today.  Concerns regarding medicines are outlined above.  No orders of the defined types were placed in this encounter.  Meds ordered  this encounter  Medications   isosorbide mononitrate (IMDUR) 30 MG 24 hr tablet    Sig: Take 1 tablet (30 mg total) by mouth daily.    Dispense:  90 tablet    Refill:  3    Please d/c any other orders for Isosorbide.  This is a dose increase    Patient Instructions  Medication Instructions:  Please increase your Isosorbide to 30 mg by mouth daily. Continue all other medications as listed.  *If you need a refill on your cardiac medications before your next appointment, please call your pharmacy*  Follow-Up: At [facility], you and your health needs are our priority.  As part of our continuing mission to provide you with exceptional heart care, we have created designated Provider Care Teams.  These Care Teams include your primary Cardiologist (physician) and Advanced Practice Providers (APPs -  Physician Assistants and Nurse Practitioners) who all work together to provide you with the care you need, when you need it.  Your next appointment:   6 month(s)  The format for your next appointment:   Virtual Visit   Provider:   Cecilie Kicks,  NP  Thank you for choosing [facility]!!        Signed, [name], MD  07/06/2019 3:07 PM    [place]

## 2019-10-09 ENCOUNTER — Other Ambulatory Visit: Payer: Self-pay

## 2019-10-09 ENCOUNTER — Emergency Department (HOSPITAL_COMMUNITY): Payer: Medicare Other

## 2019-10-09 ENCOUNTER — Inpatient Hospital Stay (HOSPITAL_COMMUNITY)
Admission: EM | Admit: 2019-10-09 | Discharge: 2019-10-11 | DRG: 303 | Disposition: A | Discharge status: Home / Self Care | Payer: Medicare Other | Attending: Internal Medicine | Admitting: Internal Medicine

## 2019-10-09 ENCOUNTER — Encounter (HOSPITAL_COMMUNITY): Payer: Self-pay

## 2019-10-09 DIAGNOSIS — K219 Gastro-esophageal reflux disease without esophagitis: Secondary | ICD-10-CM | POA: Diagnosis not present

## 2019-10-09 DIAGNOSIS — Z79899 Other long term (current) drug therapy: Secondary | ICD-10-CM

## 2019-10-09 DIAGNOSIS — Z87891 Personal history of nicotine dependence: Secondary | ICD-10-CM

## 2019-10-09 DIAGNOSIS — Z8249 Family history of ischemic heart disease and other diseases of the circulatory system: Secondary | ICD-10-CM | POA: Diagnosis not present

## 2019-10-09 DIAGNOSIS — E669 Obesity, unspecified: Secondary | ICD-10-CM | POA: Diagnosis present

## 2019-10-09 DIAGNOSIS — K589 Irritable bowel syndrome without diarrhea: Secondary | ICD-10-CM | POA: Diagnosis present

## 2019-10-09 DIAGNOSIS — E1169 Type 2 diabetes mellitus with other specified complication: Secondary | ICD-10-CM | POA: Diagnosis present

## 2019-10-09 DIAGNOSIS — G8929 Other chronic pain: Secondary | ICD-10-CM | POA: Diagnosis present

## 2019-10-09 DIAGNOSIS — I130 Hypertensive heart and chronic kidney disease with heart failure and stage 1 through stage 4 chronic kidney disease, or unspecified chronic kidney disease: Secondary | ICD-10-CM | POA: Diagnosis present

## 2019-10-09 DIAGNOSIS — Z79890 Hormone replacement therapy (postmenopausal): Secondary | ICD-10-CM

## 2019-10-09 DIAGNOSIS — I4819 Other persistent atrial fibrillation: Secondary | ICD-10-CM | POA: Diagnosis present

## 2019-10-09 DIAGNOSIS — Z955 Presence of coronary angioplasty implant and graft: Secondary | ICD-10-CM | POA: Diagnosis not present

## 2019-10-09 DIAGNOSIS — E785 Hyperlipidemia, unspecified: Secondary | ICD-10-CM | POA: Diagnosis present

## 2019-10-09 DIAGNOSIS — I2510 Atherosclerotic heart disease of native coronary artery without angina pectoris: Secondary | ICD-10-CM | POA: Diagnosis present

## 2019-10-09 DIAGNOSIS — I252 Old myocardial infarction: Secondary | ICD-10-CM | POA: Diagnosis not present

## 2019-10-09 DIAGNOSIS — I10 Essential (primary) hypertension: Secondary | ICD-10-CM | POA: Diagnosis present

## 2019-10-09 DIAGNOSIS — Z66 Do not resuscitate: Secondary | ICD-10-CM | POA: Diagnosis present

## 2019-10-09 DIAGNOSIS — N1832 Chronic kidney disease, stage 3b: Secondary | ICD-10-CM | POA: Diagnosis not present

## 2019-10-09 DIAGNOSIS — M5137 Other intervertebral disc degeneration, lumbosacral region: Secondary | ICD-10-CM | POA: Diagnosis present

## 2019-10-09 DIAGNOSIS — I208 Other forms of angina pectoris: Secondary | ICD-10-CM | POA: Diagnosis present

## 2019-10-09 DIAGNOSIS — E113299 Type 2 diabetes mellitus with mild nonproliferative diabetic retinopathy without macular edema, unspecified eye: Secondary | ICD-10-CM | POA: Diagnosis not present

## 2019-10-09 DIAGNOSIS — N183 Chronic kidney disease, stage 3 unspecified: Secondary | ICD-10-CM

## 2019-10-09 DIAGNOSIS — I200 Unstable angina: Secondary | ICD-10-CM

## 2019-10-09 DIAGNOSIS — Z20822 Contact with and (suspected) exposure to covid-19: Secondary | ICD-10-CM | POA: Diagnosis not present

## 2019-10-09 DIAGNOSIS — Z6831 Body mass index (BMI) 31.0-31.9, adult: Secondary | ICD-10-CM

## 2019-10-09 DIAGNOSIS — Z833 Family history of diabetes mellitus: Secondary | ICD-10-CM | POA: Diagnosis not present

## 2019-10-09 DIAGNOSIS — Z7901 Long term (current) use of anticoagulants: Secondary | ICD-10-CM | POA: Diagnosis not present

## 2019-10-09 DIAGNOSIS — Z888 Allergy status to other drugs, medicaments and biological substances status: Secondary | ICD-10-CM

## 2019-10-09 DIAGNOSIS — I25110 Atherosclerotic heart disease of native coronary artery with unstable angina pectoris: Principal | ICD-10-CM | POA: Diagnosis present

## 2019-10-09 DIAGNOSIS — E039 Hypothyroidism, unspecified: Secondary | ICD-10-CM | POA: Diagnosis present

## 2019-10-09 DIAGNOSIS — R001 Bradycardia, unspecified: Secondary | ICD-10-CM | POA: Diagnosis not present

## 2019-10-09 DIAGNOSIS — I5032 Chronic diastolic (congestive) heart failure: Secondary | ICD-10-CM | POA: Diagnosis not present

## 2019-10-09 DIAGNOSIS — Z7984 Long term (current) use of oral hypoglycemic drugs: Secondary | ICD-10-CM

## 2019-10-09 DIAGNOSIS — E1159 Type 2 diabetes mellitus with other circulatory complications: Secondary | ICD-10-CM | POA: Diagnosis present

## 2019-10-09 DIAGNOSIS — I25118 Atherosclerotic heart disease of native coronary artery with other forms of angina pectoris: Secondary | ICD-10-CM

## 2019-10-09 DIAGNOSIS — Z7902 Long term (current) use of antithrombotics/antiplatelets: Secondary | ICD-10-CM

## 2019-10-09 LAB — BASIC METABOLIC PANEL
Anion gap: 9 (ref 5–15)
BUN: 22 mg/dL (ref 8–23)
CO2: 18 mmol/L — ABNORMAL LOW (ref 22–32)
Calcium: 11.1 mg/dL — ABNORMAL HIGH (ref 8.9–10.3)
Chloride: 107 mmol/L (ref 98–111)
Creatinine, Ser: 1.38 mg/dL — ABNORMAL HIGH (ref 0.44–1.00)
GFR calc Af Amer: 40 mL/min — ABNORMAL LOW (ref >60)
GFR calc non Af Amer: 35 mL/min — ABNORMAL LOW (ref >60)
Glucose, Bld: 109 mg/dL — ABNORMAL HIGH (ref 70–99)
Potassium: 4.9 mmol/L (ref 3.5–5.1)
Sodium: 134 mmol/L — ABNORMAL LOW (ref 135–145)

## 2019-10-09 LAB — TROPONIN I (HIGH SENSITIVITY)
Troponin I (High Sensitivity): 13 ng/L (ref <18)
Troponin I (High Sensitivity): 15 ng/L (ref <18)

## 2019-10-09 LAB — CBC
HCT: 36.8 % (ref 36.0–46.0)
Hemoglobin: 11.1 g/dL — ABNORMAL LOW (ref 12.0–15.0)
MCH: 29.1 pg (ref 26.0–34.0)
MCHC: 30.2 g/dL (ref 30.0–36.0)
MCV: 96.3 fL (ref 80.0–100.0)
Platelets: 282 10*3/uL (ref 150–400)
RBC: 3.82 MIL/uL — ABNORMAL LOW (ref 3.87–5.11)
RDW: 17.3 % — ABNORMAL HIGH (ref 11.5–15.5)
WBC: 10.3 10*3/uL (ref 4.0–10.5)
nRBC: 0 % (ref 0.0–0.2)

## 2019-10-09 MED ORDER — ONDANSETRON HCL 4 MG/2ML IJ SOLN: 4.0000 mg | Freq: Four times a day (QID) | INTRAMUSCULAR | Status: DC | PRN

## 2019-10-09 MED ORDER — ACETAMINOPHEN 325 MG PO TABS
650.0000 mg | ORAL_TABLET | Freq: Four times a day (QID) | ORAL | Status: DC | PRN
  Filled 2019-10-09: qty 2

## 2019-10-09 MED ORDER — OXYCODONE HCL 5 MG PO TABS
5.0000 mg | ORAL_TABLET | Freq: Every evening | ORAL | Status: DC | PRN
  Administered 2019-10-10: 5 mg via ORAL
  Filled 2019-10-09: qty 1

## 2019-10-09 MED ORDER — LEVOTHYROXINE SODIUM 75 MCG PO TABS
75.0000 ug | ORAL_TABLET | Freq: Every day | ORAL | Status: DC
  Administered 2019-10-10 – 2019-10-11 (×2): 75 ug via ORAL
  Filled 2019-10-09 (×2): qty 1

## 2019-10-09 MED ORDER — ISOSORBIDE MONONITRATE ER 30 MG PO TB24
30.0000 mg | ORAL_TABLET | Freq: Every day | ORAL | Status: DC
  Administered 2019-10-10 – 2019-10-11 (×2): 30 mg via ORAL
  Filled 2019-10-09 (×2): qty 1

## 2019-10-09 MED ORDER — AMLODIPINE BESYLATE 2.5 MG PO TABS
2.5000 mg | ORAL_TABLET | Freq: Every day | ORAL | Status: DC
  Administered 2019-10-10 – 2019-10-11 (×2): 2.5 mg via ORAL
  Filled 2019-10-09 (×2): qty 1

## 2019-10-09 MED ORDER — ALBUTEROL SULFATE (2.5 MG/3ML) 0.083% IN NEBU: 3.0000 mL | INHALATION_SOLUTION | RESPIRATORY_TRACT | Status: DC | PRN

## 2019-10-09 MED ORDER — ROSUVASTATIN CALCIUM 20 MG PO TABS
20.0000 mg | ORAL_TABLET | Freq: Every evening | ORAL | Status: DC
  Administered 2019-10-10: 20 mg via ORAL
  Filled 2019-10-09: qty 1

## 2019-10-09 MED ORDER — ONDANSETRON HCL 4 MG PO TABS: 4.0000 mg | ORAL_TABLET | Freq: Four times a day (QID) | ORAL | Status: DC | PRN

## 2019-10-09 MED ORDER — NITROGLYCERIN 0.4 MG SL SUBL: 0.4000 mg | SUBLINGUAL_TABLET | SUBLINGUAL | Status: DC | PRN

## 2019-10-09 MED ORDER — INSULIN ASPART 100 UNIT/ML ~~LOC~~ SOLN: 0.0000 [IU] | Freq: Three times a day (TID) | SUBCUTANEOUS | Status: DC

## 2019-10-09 MED ORDER — OXYCODONE HCL 5 MG PO TABS
10.0000 mg | ORAL_TABLET | Freq: Four times a day (QID) | ORAL | Status: DC | PRN
  Administered 2019-10-09 – 2019-10-11 (×4): 10 mg via ORAL
  Filled 2019-10-09 (×5): qty 2

## 2019-10-09 MED ORDER — PANTOPRAZOLE SODIUM 40 MG PO TBEC
40.0000 mg | DELAYED_RELEASE_TABLET | Freq: Every day | ORAL | Status: DC
  Administered 2019-10-10 – 2019-10-11 (×2): 40 mg via ORAL
  Filled 2019-10-09 (×2): qty 1

## 2019-10-09 MED ORDER — APIXABAN 2.5 MG PO TABS
2.5000 mg | ORAL_TABLET | Freq: Two times a day (BID) | ORAL | Status: DC
  Filled 2019-10-09: qty 1

## 2019-10-09 MED ORDER — ACETAMINOPHEN 650 MG RE SUPP: 650.0000 mg | Freq: Four times a day (QID) | RECTAL | Status: DC | PRN

## 2019-10-09 MED ORDER — SODIUM CHLORIDE 0.9% FLUSH
3.0000 mL | Freq: Two times a day (BID) | INTRAVENOUS | Status: DC
  Administered 2019-10-09 – 2019-10-11 (×4): 3 mL via INTRAVENOUS

## 2019-10-09 MED ORDER — SODIUM CHLORIDE 0.9% FLUSH: 3.0000 mL | Freq: Once | INTRAVENOUS | Status: DC

## 2019-10-09 MED ORDER — CLOPIDOGREL BISULFATE 75 MG PO TABS
75.0000 mg | ORAL_TABLET | Freq: Every day | ORAL | Status: DC
  Administered 2019-10-10 – 2019-10-11 (×2): 75 mg via ORAL
  Filled 2019-10-09 (×2): qty 1

## 2019-10-09 MED ORDER — RAMIPRIL 2.5 MG PO CAPS
5.0000 mg | ORAL_CAPSULE | Freq: Every day | ORAL | Status: DC
  Administered 2019-10-10 – 2019-10-11 (×2): 5 mg via ORAL
  Filled 2019-10-09: qty 2
  Filled 2019-10-09: qty 1
  Filled 2019-10-09: qty 2

## 2019-10-09 NOTE — Consult Note (Signed)
Cardiology Consult    Patient ID: Kristine Oliver MRN: [id number], DOB/AGE: January 12, 1931   Admit date: 10/09/2019 Date of Consult: 10/09/2019  Primary Physician: Lavone Orn, MD Primary Cardiologist: [name], MD Requesting Provider: Nadeen Landau, MD  Patient Profile    Kristine Oliver is a 86 y.o. female with a history of CAD with NSTEMI in 2005 s/p DES to LAD and in 2019 s/p DES to mCx, as well as persistent AF (on Eliquis), HTN, type 2 DM, HFpEF, chronotropic incompetence (declined PPM), CKD III, and chronic pain. She is being seen today for the evaluation of chest pain.   History of Present Illness    Kristine Oliver has a long history of CAD with NSTEMI in 2005 treated with Taxus DES to LAD. She subsequently underwent repeat catheterization March 2012 which showed patent LAD stent with a jailed septal perforator. She otherwise had nonobstructive disease and was medically managed.She continued to have chest pain intermittently and underwent Myoview stress testing in November 2014 and in August 2017 without evidence of ischemia. More recently, she presented in August 2019 with recurrent chest pain episodes at rest with radiation down both arms and to her jaw. Troponin was only mildly elevated at 0.57 and echo showed normal LV function, but was found to have 90% stenosis of her mid circumflex treated with [place] 2.75x74mm DES. Prior LAD stent was widely patent and she otherwise had nonobstructive disease. She returned to the ED just a month later with chest pain again radiating to her neck associated with nausea that ultimately resolved with nitroglycerin. Troponins were negative and ECG showed no ischemic changes. No further testing was pursued at that time and she has continued on medical therapy with Imdur, last increased in December for her chronic angina. She is intolerant to beta blockers due to bradycardia and excessive fatigue. While in cardiac rehab she was found to have atrial fibrillation  for which she was cardioverted this past January. She is maintained on Eliquis for AF in addition to Plavix for CAD.   Today she reports severe fatigue for the past month, difficult to do anything - this is not new, but worse. Some dyspnea as well. Angina had improved after last PCI but has had 2 "angina" episodes over the last week - first was 4 days ago and 1 yesterday. Occur when she tries to "get going to actually do something" (getting dressed, shower, etc). Feels like pressure that comes in waves going up her neck then across her chest. Can be associated with nausea. Starts taking nitro every 5 mintues immediately when she feels it coming on. Both episodes resolved in 15-20 minutes. Saw PCP who advised ED evaluation. Reports compliance with medications.  In ED, ECG showed rate-controlled AF without any ischemic changes. Troponins both negative (13, 15). BP normal as well.   Past Medical History   Past Medical History:  Diagnosis Date  . Anxiety   . Bradycardia   . CAD (coronary artery disease)    a. 08/2004 NSTEMI/PCI: LAD 66m/d (2.75 x 28 mm Taxus 2 DES);  b. 10/2010 Cath: LM nl, LAD patent stents w/ jailed septal, LCX 30, RCA 30; c. 06/2013 MV: No ischemia. Small fixed anteroseptal defect, ? scar vs atten. EF 62%; d. 03/2016 MV: EF 57%, Low risk.  . Chronic diastolic heart failure ([place])    a. Echo 7/13:  mod LVH, EF 65-70%, vigorous LV, Gr 1 diast dysfn, mild LAE; b. 05/2017 Echo: EF 60-65%, mod LVH, no rwma, Gr1  DD, mildly dil LA.  Marland Kitchen Chronic pain disorder   . CKD (chronic kidney disease), stage III   . DDD (degenerative disc disease), lumbosacral    , Spinal stenosis  . Diabetes mellitus ([place])    , Type II  . GERD (gastroesophageal reflux disease)   . HTN (hypertension)   . Hyperlipidemia   . Hypothyroidism   . IBS (irritable bowel syndrome)   . Lumbar disc disease   . Nonproliferative diabetic retinopathy associated with type 2 diabetes mellitus ([place])   . Obesity (BMI 30-39.9)     . Peripheral neuropathy    , Associated with DM  . Scoliosis     Past Surgical History:  Procedure Laterality Date  . BACK SURGERY    . CARDIAC CATHETERIZATION    . CARDIOVERSION N/A 08/25/2018   Procedure: CARDIOVERSION;  Surgeon: Jerline Pain, MD;  [facility] ENDOSCOPY;  Service: Cardiovascular;  Laterality: N/A;  . CHOLECYSTECTOMY    . CORONARY STENT INTERVENTION N/A 03/21/2018   Procedure: CORONARY STENT INTERVENTION;  Surgeon: Jettie Booze, MD;  Location: [facility] LAB;  Service: Cardiovascular;  Laterality: N/A;  . DILATION AND CURETTAGE OF UTERUS    . HEMILAMINOTOMY LUMBAR SPINE  2002   Rt L5, with decompression and foraminotomy  . INDUCED ABORTION     , x2  . LAMINOTOMY Right   . LEFT HEART CATH AND CORONARY ANGIOGRAPHY N/A 03/21/2018   Procedure: LEFT HEART CATH AND CORONARY ANGIOGRAPHY;  Surgeon: Jettie Booze, MD;  Location: [facility];  Service: Cardiovascular;  Laterality: N/A;  . LUMBAR [facility]  07/1999   L5-S1  . PILONIDAL CYST EXCISION    . ROTATOR CUFF REPAIR    . TONSILLECTOMY    . TOTAL ABDOMINAL HYSTERECTOMY W/ BILATERAL SALPINGOOPHORECTOMY       Allergies  Allergen Reactions  . Pravastatin Other (See Comments)    Muscle aches  . Prednisone Nausea And Vomiting   Inpatient Medications    . sodium chloride flush  3 mL Intravenous Once    Family History    Family History  Problem Relation Age of Onset  . Heart failure Father   . Diabetes Father   . Coronary artery disease Mother    She indicated that her mother is deceased. She indicated that her father is deceased. She indicated that her sister is alive. She indicated that her maternal grandmother is deceased. She indicated that her maternal grandfather is deceased. She indicated that her paternal grandmother is deceased. She indicated that her paternal grandfather is deceased.   Social History    Social History   Socioeconomic History  . Marital status:  Divorced    Spouse name: Not on file  . Number of children: Not on file  . Years of education: Not on file  . Highest education level: Not on file  Occupational History  . Occupation: retired  Tobacco Use  . Smoking status: Former Smoker    Years: 35.00  . Smokeless tobacco: Never Used  Substance and Sexual Activity  . Alcohol use: No    Comment: social drinking, not often  . Drug use: No  . Sexual activity: Not on file  Other Topics Concern  . Not on file  Social History Narrative   Divorced.  Lives with her nephew in [place].  She walks most days of the week.   Social Determinants of Health   Financial Resource Strain:   . Difficulty of Paying Living Expenses: Not on  file  Food Insecurity:   . Worried About Charity fundraiser in the Last Year: Not on file  . Ran Out of Food in the Last Year: Not on file  Transportation Needs:   . Lack of Transportation (Medical): Not on file  . Lack of Transportation (Non-Medical): Not on file  Physical Activity:   . Days of Exercise per Week: Not on file  . Minutes of Exercise per Session: Not on file  Stress:   . Feeling of Stress : Not on file  Social Connections:   . Frequency of Communication with Friends and Family: Not on file  . Frequency of Social Gatherings with Friends and Family: Not on file  . Attends Religious Services: Not on file  . Active Member of Clubs or Organizations: Not on file  . Attends Archivist Meetings: Not on file  . Marital Status: Not on file  Intimate Partner Violence:   . Fear of Current or Ex-Partner: Not on file  . Emotionally Abused: Not on file  . Physically Abused: Not on file  . Sexually Abused: Not on file     Review of Systems    A comprehensive review of 10 systems was performed with pertinent positives and negatives noted in HPI.  Physical Exam    Blood pressure 119/70, pulse 77, temperature 98 F (36.7 C), temperature source Oral, resp. rate 16, height 4\' 11"  (1.499  m), weight 74.8 kg, SpO2 94 %.    No intake or output data in the 24 hours ending 10/09/19 2245 Wt Readings from Last 3 Encounters:  10/09/19 74.8 kg  07/06/19 77.1 kg  12/15/18 76.5 kg   CONSTITUTIONAL: alert, elderly woman, lying flat in bed, in no distress HEENT: face is somewhat ruddy appearing. Conjunctiva normal, EOM intact, pupils equal, no lid lag. NECK: supple, no cervical adenopathy, no thyromegaly CARDIOVASCULAR: Irregular rhythm. No gallop, murmur, or rub. Normal S1/S2. Radial pulses intact. JVP is mildly elevated to about 8 cm.  PULMONARY/CHEST WALL: no deformities, normal breath sounds bilaterally, normal work of breathing ABDOMINAL: soft, non-tender, non-distended EXTREMITIES: trace edema, muscle atrophy is present, warm and well-perfused SKIN: Dry and intact without apparent rashes or wounds.  NEUROLOGIC: alert, no abnormal movements, cranial nerves grossly intact.   Labs    Cardiac Panel (last 3 results) Recent Labs    10/09/19 1636 10/09/19 1923  TROPONINIHS 13 15    Lab Results  Component Value Date   WBC 10.3 10/09/2019   HGB 11.1 (L) 10/09/2019   HCT 36.8 10/09/2019   MCV 96.3 10/09/2019   PLT 282 10/09/2019    Recent Labs  Lab 10/09/19 1636  NA 134*  K 4.9  CL 107  CO2 18*  BUN 22  CREATININE 1.38*  CALCIUM 11.1*  GLUCOSE 109*   Lab Results  Component Value Date   CHOL 123 10/30/2018   HDL 55 10/30/2018   LDLCALC 47 10/30/2018   TRIG 104 10/30/2018   Lab Results  Component Value Date   DDIMER 0.55 (H) 04/27/2018     Radiology Studies    DG Chest 2 View  Result Date: 10/09/2019 CLINICAL DATA:  Angina tack 2 days ago. Shortness of breath. Fatigue. Weakness. EXAM: CHEST - 2 VIEW COMPARISON:  10/29/2018.  04/22/2018. FINDINGS: Accentuation of expected thoracic kyphosis with lower thoracic spondylosis. Midline trachea. Normal heart size. Peripheral predominant pulmonary interstitial thickening. No lobar consolidation. IMPRESSION: Slowly  progressive pulmonary interstitial thickening, peripheral predominant. Although this could be related to pulmonary  venous congestion, interstitial lung disease cannot be excluded. If the patient's symptoms are chronic and not felt to be related to congestive failure, consider high-resolution chest CT. Electronically Signed   By: Abigail Miyamoto M.D.   On: 10/09/2019 17:05    ECG & Cardiac Imaging    ECG: Atrial fibrillation, otherwise normal - personally reviewed.  Assessment & Plan    CAD with chronic angina: On extensive chart review, it appears that she has described the same pain on numerous occasions previously and on one occasion was found to have obstructive circumflex disease. She believes her symptoms did improve with revascularization, however, she actually did continue to have this same chest pain intermittently despite complete revascularization (even presented to ED within 1 month post-PCI with similar presentation). It also seems to me that recurrence of her symptoms has been more frequent that she reports today with more chronicity. I think her fatigue may be due primarily to her atrial fibrillation and possibly OSA. There is definitely an emotional/psychiatric component to her chest pain and she does admit that it seems to come on when she start getting upset. It is unclear if she actually requires the nitro to resolve the chest pain as she immediately starts taking back to back doses until it goes away. With negative troponin values, inconsistent chest pain, and no ECG changes, I would be hesitant to pursue repeat invasive angiography at this point. Recommend continued medical management and optimization of comorbidities, as above. JVP today does appear to be a little elevated as well.  - Consider discontinuing ramipril for uptitration of Imdur and Norvasc for antianginal therapy - We can also check price of Ranexa  - Lasix 40mg  IV x 1 and assess response  Long-standing persistent atrial  fibrillation Chronic fatigue Her AF is very likely symptomatic at at least significantly contributing to her fatigue. She would likely benefit from rhythm control, thus far only attempted with 1 cardioversion and no AAD. She also has not been screened for OSA, which may also be contributing to her symptoms and persistence of her AF. She has remained on uninterrupted anticoagulation with Eliquis, which she is tolerating. Given persistent AF, chronotropic incompetence is currently not contributing.  - Discuss candidacy for outpatient ablation with EP, her goal is quality of life. Given her CKD and coronary disease, she has few options for AADs. It may be worth considering amiodarone and pacing if needed. - Outpatient sleep study - Continue Eliquis 2.5mg  BID  Signed, Marykay Lex, MD 10/09/2019, 10:45 PM  For questions or updates, please contact   Please consult [email or web address] for contact info under Cardiology/STEMI.

## 2019-10-09 NOTE — ED Triage Notes (Signed)
Pt presents with an "angina attack" 2 days ago relieved with nitro x3. Pt reports another "angina attack" yesterday radiating up into her neck again relieved with nitro x3. PCP sent pt here. Pt with multiple complaints, "big hardening heart" SOB, weakness, fatigue and nasal drainage for several days.

## 2019-10-09 NOTE — ED Provider Notes (Signed)
[facility] EMERGENCY DEPARTMENT Provider Note   CSN: [id number] Arrival date & time: 10/09/19  1614     History Chief Complaint  Patient presents with  . Chest Pain    Kristine Oliver is a 86 y.o. female.  HPI  Pt is an 86 yo F with a PMH of CAD (s/p LHC in 2019 with DES to circ), hypothyroidism, T2DM, HLD, CHF, HTN, Afib presenting to the ED today due to chest pain. Pt reports that she has been having "angina attacks" more frequently than normal. She also says that they occur with lesser amounts of physical activity. The pain is "squeezing" and localized to the center of her chest and radiates to her neck. She has been using SL nitrox3 which improves the pain but she says that afterwards, she gets tired and has to sleep for many hours. She is not currently experiencing chest pain. She endorses nausea but denies vomiting, SOB, fever, chills, diarrhea.     Past Medical History:  Diagnosis Date  . Anxiety   . Bradycardia   . CAD (coronary artery disease)    a. 08/2004 NSTEMI/PCI: LAD 1m/d (2.75 x 28 mm Taxus 2 DES);  b. 10/2010 Cath: LM nl, LAD patent stents w/ jailed septal, LCX 30, RCA 30; c. 06/2013 MV: No ischemia. Small fixed anteroseptal defect, ? scar vs atten. EF 62%; d. 03/2016 MV: EF 57%, Low risk.  . Chronic diastolic heart failure ([place])    a. Echo 7/13:  mod LVH, EF 65-70%, vigorous LV, Gr 1 diast dysfn, mild LAE; b. 05/2017 Echo: EF 60-65%, mod LVH, no rwma, Gr1 DD, mildly dil LA.  Marland Kitchen Chronic pain disorder   . CKD (chronic kidney disease), stage III   . DDD (degenerative disc disease), lumbosacral    , Spinal stenosis  . Diabetes mellitus ([place])    , Type II  . GERD (gastroesophageal reflux disease)   . HTN (hypertension)   . Hyperlipidemia   . Hypothyroidism   . IBS (irritable bowel syndrome)   . Lumbar disc disease   . Nonproliferative diabetic retinopathy associated with type 2 diabetes mellitus ([place])   . Obesity (BMI 30-39.9)   . Peripheral  neuropathy    , Associated with DM  . Scoliosis     Patient Active Problem List   Diagnosis Date Noted  . AKI (acute kidney injury) ([place])   . Unstable angina ([place]) 10/29/2018  . Persistent atrial fibrillation ([place])   . Status post coronary artery stent placement   . NSTEMI (non-ST elevated myocardial infarction) ([place]) 03/18/2018  . Essential hypertension 05/19/2016  . Chest pain 05/18/2016  . Bradycardia 09/26/2014  . Sinus bradycardia 10/30/2013  . Fatigue 10/30/2013  . Acute on chronic diastolic heart failure ([place]) 06/26/2013  . CAD (coronary artery disease) 02/12/2012  . Hypercalcemia 02/12/2012  . Chronic pain disorder   . Hypothyroidism   . Type 2 diabetes mellitus with vascular disease ([place])   . Hyperlipidemia   . Lumbar disc disease   . Obesity (BMI 30-39.9)   . Hypertensive heart disease without CHF   . Bipolar affective disorder ([place])   . Scoliosis     Past Surgical History:  Procedure Laterality Date  . BACK SURGERY    . CARDIAC CATHETERIZATION    . CARDIOVERSION N/A 08/25/2018   Procedure: CARDIOVERSION;  Surgeon: Jerline Pain, MD;  Location: [facility] ENDOSCOPY;  Service: Cardiovascular;  Laterality: N/A;  . CHOLECYSTECTOMY    . CORONARY STENT INTERVENTION N/A 03/21/2018  Procedure: CORONARY STENT INTERVENTION;  Surgeon: Jettie Booze, MD;  Location: [facility] CV LAB;  Service: Cardiovascular;  Laterality: N/A;  . DILATION AND CURETTAGE OF UTERUS    . HEMILAMINOTOMY LUMBAR SPINE  2002   Rt L5, with decompression and foraminotomy  . INDUCED ABORTION     , x2  . LAMINOTOMY Right   . LEFT HEART CATH AND CORONARY ANGIOGRAPHY N/A 03/21/2018   Procedure: LEFT HEART CATH AND CORONARY ANGIOGRAPHY;  Surgeon: Jettie Booze, MD;  Location: [facility] CV LAB;  Service: Cardiovascular;  Laterality: N/A;  . LUMBAR [place] SURGERY  07/1999   L5-S1  . PILONIDAL CYST EXCISION    . ROTATOR CUFF REPAIR    . TONSILLECTOMY    . TOTAL ABDOMINAL HYSTERECTOMY W/  BILATERAL SALPINGOOPHORECTOMY       OB History   No obstetric history on file.     Family History  Problem Relation Age of Onset  . Heart failure Father   . Diabetes Father   . Coronary artery disease Mother     Social History   Tobacco Use  . Smoking status: Former Smoker    Years: 35.00  . Smokeless tobacco: Never Used  Substance Use Topics  . Alcohol use: No    Comment: social drinking, not often  . Drug use: No    Home Medications Prior to Admission medications   Medication Sig Start Date End Date Taking? Authorizing Provider  amLODipine (NORVASC) 2.5 MG tablet Take 1 tablet (2.5 mg total) by mouth daily. 11/01/18   Jerline Pain, MD  antiseptic oral rinse (BIOTENE) LIQD 15 mLs by Mouth Rinse route 2 (two) times daily.     [provider]  apixaban (ELIQUIS) 2.5 MG TABS tablet Take 1 tablet (2.5 mg total) by mouth 2 (two) times daily. 10/31/18   Duke, Tami Lin, PA  ASPERCREME LIDOCAINE EX Apply 1 application topically 2 (two) times daily as needed (back/feet pain).    [provider]  Carboxymethylcellulose Sod PF (RETAINE CMC) 0.5 % SOLN Place 1 drop into both eyes daily.     [provider]  clindamycin (CLEOCIN T) 1 % lotion Apply 1 application topically 2 (two) times daily. Facial wash 11/16/13   [provider]  clopidogrel (PLAVIX) 75 MG tablet Take 1 tablet (75 mg total) by mouth daily. 03/23/18   Kroeger, Lorelee Cover., PA-C  cycloSPORINE (RESTASIS) 0.05 % ophthalmic emulsion Place 1 drop into both eyes 2 (two) times daily as needed (dry eyes).     [provider]  docusate sodium (COLACE) 100 MG capsule Take 100 mg by mouth daily.    [provider]  furosemide (LASIX) 20 MG tablet Take 1 tablet by mouth daily, may take a extra tablet daily only as needed if wt is up to 171 12/01/18   Isaiah Serge, NP  furosemide (LASIX) 20 MG tablet Take 1 tablet (20 mg total) by mouth daily. 03/10/19   Drenda Freeze, MD    gabapentin (NEURONTIN) 300 MG capsule Take 300 mg by mouth 2 (two) times daily.     [provider]  isosorbide mononitrate (IMDUR) 30 MG 24 hr tablet Take 1 tablet (30 mg total) by mouth daily. 07/06/19 10/04/19  Jerline Pain, MD  levothyroxine (SYNTHROID, LEVOTHROID) 75 MCG tablet Take 75 mcg by mouth daily.    [provider]  Melatonin 3 MG TABS Take 3 mg by mouth at bedtime as needed (sleep).    [provider]  metFORMIN (GLUCOPHAGE) 500 MG tablet Take 500 mg by mouth 2 (two) times daily with a meal.  03/17/16   [provider]  nitroGLYCERIN (NITROSTAT) 0.4 MG SL tablet DISSOLVE 1 TABLET UNDER THE TONGUE EVERY 5 MINUTES FOR 3 DOSES AS NEEDED FOR CHEST PAIN 01/23/19   Reino Bellis B, NP  oxyCODONE (OXY IR/ROXICODONE) 5 MG immediate release tablet Take 5 mg by mouth at bedtime. 10/19/18   [provider]  Oxycodone HCl 10 MG TABS Take 1 tablet (10 mg total) by mouth 4 (four) times daily. 03/10/19   Drenda Freeze, MD  pantoprazole (PROTONIX) 40 MG tablet TAKE 1 TABLET(40 MG) BY MOUTH DAILY 03/27/19   Jerline Pain, MD  PREMARIN vaginal cream Place 1 Applicatorful vaginally 2 (two) times a week.  10/06/13   [provider]  ramipril (ALTACE) 5 MG capsule TAKE 1 CAPSULE(5 MG) BY MOUTH DAILY 07/19/18   Kroeger, Daleen Snook M., PA-C  rosuvastatin (CRESTOR) 20 MG tablet TAKE 1 TABLET(20 MG) BY MOUTH DAILY AT 6 PM 05/26/19   Kroeger, Daleen Snook M., PA-C  sodium chloride (OCEAN) 0.65 % SOLN nasal spray Place 1 spray into both nostrils as needed for congestion (dryness).    [provider]  valACYclovir (VALTREX) 500 MG tablet Take 500 mg by mouth 2 (two) times daily.     [provider]  vitamin B-12 (CYANOCOBALAMIN) 500 MCG tablet Take 500 mcg by mouth daily.    [provider]    Allergies    Pravastatin and Prednisone  Review of Systems   Review of Systems  Constitutional: Negative for chills and fever.  HENT: Negative  for congestion and sore throat.   Eyes: Negative for visual disturbance.  Respiratory: Negative for cough and shortness of breath.   Cardiovascular: Positive for chest pain. Negative for palpitations and leg swelling.  Gastrointestinal: Positive for nausea. Negative for abdominal pain and vomiting.  Genitourinary: Negative for dysuria and hematuria.  Musculoskeletal: Negative for arthralgias and back pain.  Skin: Negative for color change and rash.  Neurological: Negative for seizures and syncope.  Psychiatric/Behavioral: Negative for agitation.  All other systems reviewed and are negative.   Physical Exam Updated Vital Signs BP (!) 114/59 (BP Location: Right Arm)   Pulse 73   Temp 98 F (36.7 C) (Oral)   Resp 20   Ht 4\' 11"  (1.499 m)   Wt 74.8 kg   SpO2 94%   BMI 33.33 kg/m   Physical Exam Vitals and nursing note reviewed.  Constitutional:      General: She is not in acute distress.    Appearance: She is well-developed.  HENT:     Head: Normocephalic and atraumatic.     Right Ear: External ear normal.     Left Ear: External ear normal.     Nose: Nose normal. No congestion or rhinorrhea.     Mouth/Throat:     Mouth: Mucous membranes are moist.     Pharynx: Oropharynx is clear.  Eyes:     Extraocular Movements: Extraocular movements intact.     Conjunctiva/sclera: Conjunctivae normal.     Pupils: Pupils are equal, round, and reactive to light.  Cardiovascular:     Rate and Rhythm: Normal rate and regular rhythm.     Pulses: Normal pulses.     Heart sounds: Normal heart sounds.     Comments: No bruit. Pulmonary:     Effort: Pulmonary effort is normal. No respiratory distress.  Breath sounds: Normal breath sounds. No stridor. No wheezing, rhonchi or rales.  Abdominal:     General: There is no distension.     Palpations: Abdomen is soft.     Tenderness: There is no abdominal tenderness.  Musculoskeletal:        General: Normal range of motion.     Cervical  back: Normal range of motion and neck supple.     Right lower leg: No edema.     Left lower leg: No edema.  Skin:    General: Skin is warm and dry.     Capillary Refill: Capillary refill takes less than 2 seconds.  Neurological:     General: No focal deficit present.     Mental Status: She is alert and oriented to person, place, and time. Mental status is at baseline.  Psychiatric:        Mood and Affect: Mood normal.     ED Results / Procedures / Treatments   Labs (all labs ordered are listed, but only abnormal results are displayed) Labs Reviewed  BASIC METABOLIC PANEL - Abnormal; Notable for the following components:      Result Value   Sodium 134 (*)    CO2 18 (*)    Glucose, Bld 109 (*)    Creatinine, Ser 1.38 (*)    Calcium 11.1 (*)    GFR calc non Af Amer 35 (*)    GFR calc Af Amer 40 (*)    All other components within normal limits  CBC - Abnormal; Notable for the following components:   RBC 3.82 (*)    Hemoglobin 11.1 (*)    RDW 17.3 (*)    All other components within normal limits  TROPONIN I (HIGH SENSITIVITY)  TROPONIN I (HIGH SENSITIVITY)    EKG EKG Interpretation  Date/Time:  Monday October 09 2019 21:42:37 EST Ventricular Rate:  79 PR Interval:    QRS Duration: 85 QT Interval:  363 QTC Calculation: 417 R Axis:   17 Text Interpretation: Atrial fibrillation Baseline wander in lead(s) V3 When compared with ECG of EARLIER SAME DATE No significant change was found Confirmed by Delora Fuel ([id number]) on 10/10/2019 12:24:47 AM   Radiology DG Chest 2 View  Result Date: 10/09/2019 CLINICAL DATA:  Angina tack 2 days ago. Shortness of breath. Fatigue. Weakness. EXAM: CHEST - 2 VIEW COMPARISON:  10/29/2018.  04/22/2018. FINDINGS: Accentuation of expected thoracic kyphosis with lower thoracic spondylosis. Midline trachea. Normal heart size. Peripheral predominant pulmonary interstitial thickening. No lobar consolidation. IMPRESSION: Slowly progressive pulmonary  interstitial thickening, peripheral predominant. Although this could be related to pulmonary venous congestion, interstitial lung disease cannot be excluded. If the patient's symptoms are chronic and not felt to be related to congestive failure, consider high-resolution chest CT. Electronically Signed   By: Abigail Miyamoto M.D.   On: 10/09/2019 17:05    Procedures Procedures (including critical care time)  Medications Ordered in ED Medications  sodium chloride flush (NS) 0.9 % injection 3 mL (has no administration in time range)    ED Course  I have reviewed the triage vital signs and the nursing notes.  Pertinent labs & imaging results that were available during my care of the patient were reviewed by me and considered in my medical decision making (see chart for details).    MDM Rules/Calculators/A&P                     Pt is an 86 yo F with a  PMH of CAD (s/p LHC in 2019 with DES to circ), hypothyroidism, T2DM, HLD, CHF, HTN, Afib presenting to the ED today due to chest pain. Physical exam unremarkable. VSS. Afebrile.  On arrival, pt appears generally well and is displaying no signs of acute distress. Lab work initiated in triage. CBC with mild anemia (Hgb 11.1). CMP with BUN 35, creat 1.98, calcium 11.0 which is all consistent with baseline. Troponin 13 and 15, consecutively. EKG with afib, VR 79. No signs of overlying ischemia or change from previous. CXR with "slowly progressive pulmonary interstitial thickening, peripheral predominant." Workup not concerning for STEMI, NSTEMI, PE, PTX or PNA. Presentation not concerning for aortic dissection. Her presentation is concerning for unstable angina. She is not currently experiencing chest pain. HEAR score 6. Discussed pt case with cardiology who recommends stress testing.  At this time, we feel as though pt would benefit from admission for stress testing. Pt admitted to Hospitalist team. For details on hospital course following admission, please  refer to inpatient team's note. Pt stable at time of handoff.  Pt assessed and evaluated with Dr. Dayna Barker.  Nadeen Landau, MD  Final Clinical Impression(s) / ED Diagnoses Final diagnoses:  Unstable angina [facility])    Rx / DC Orders ED Discharge Orders    None       Nadeen Landau, MD 10/10/19 2518    Merrily Pew, MD 10/13/19 [id number]

## 2019-10-09 NOTE — ED Notes (Signed)
Upon entering the room, the patient complained of shortness of breath and her SPO2 was 90%. 3 liters of oxygen was applied and the patient is not at 96% and reports relief.

## 2019-10-09 NOTE — H&P (Signed)
History and Physical    Kristine Oliver MBT:[id number] DOB: [birth year] DOA: 10/09/2019  PCP: Lavone Orn, MD  Patient coming from: Home  I have personally briefly reviewed patient's old medical records in [place]  Chief Complaint: Chest pain  HPI: Kristine Oliver is a 86 y.o. female with medical history significant for CAD s/p PCI/DES, persistent atrial fibrillation on Eliquis, chronic diastolic CHF, CKD stage III, type 2 diabetes, hypertension, hyperlipidemia, hypothyroidism, and chronic pain who presents to the ED for evaluation of chest pain.  Patient reports recent recurrent anginal chest pain. She reports an episode of angina 4 days ago described as a pressure-like sensation across her chest and up her neck. This occurred with minimal activity. She took 3 nitroglycerin spaced apart with complete resolution. She says she has been having frequent anginal type pain that is occurring now with minimal activity such as trying to dress of herself or bathe herself. She had another episode yesterday around 2 PM which again resolved after taking nitroglycerin x3. She reports having associated palpitations and very little nausea without emesis. She has also been short of breath. She denies any nausea, vomiting, diaphoresis. She has not lost consciousness or passed out. She says she has not had any further episodes today.  She last underwent left heart catheterization on 03/21/2018 at which time a DES was placed to the mid circumflex lesion. She has been on Plavix antiplatelet therapy for CAD/DES and Eliquis anticoagulation for history of A. fib. She was seen by her cardiologist in December at which time her Imdur was increased for her anginal symptoms.  ED Course:  Initial vitals showed BP 112/63, pulse 78, RR 18, temp 98.0 Fahrenheit, SPO2 95% on room air.  Labs show sodium 134, potassium 4.9, bicarb 18, BUN 22, creatinine 1.38 (baseline 1.7-1.9), serum glucose 109, WBC 10.3, hemoglobin 11.1,  platelets 282,000, high-sensitivity troponin 13 >> 15.  EKG showed atrial fibrillation with controlled rate.  2 view chest x-ray shows slowly progressive pulmonary interstitial thickening.  EDP discussed the case with on-call cardiology who recommended medical admission and possible stress test tomorrow.  The hospitalist service was consulted to admit for further evaluation and management.  Review of Systems: All systems reviewed and are negative except as documented in history of present illness above.   Past Medical History:  Diagnosis Date  . Anxiety   . Bradycardia   . CAD (coronary artery disease)    a. 08/2004 NSTEMI/PCI: LAD 44m/d (2.75 x 28 mm Taxus 2 DES);  b. 10/2010 Cath: LM nl, LAD patent stents w/ jailed septal, LCX 30, RCA 30; c. 06/2013 MV: No ischemia. Small fixed anteroseptal defect, ? scar vs atten. EF 62%; d. 03/2016 MV: EF 57%, Low risk.  . Chronic diastolic heart failure ([place])    a. Echo 7/13:  mod LVH, EF 65-70%, vigorous LV, Gr 1 diast dysfn, mild LAE; b. 05/2017 Echo: EF 60-65%, mod LVH, no rwma, Gr1 DD, mildly dil LA.  Marland Kitchen Chronic pain disorder   . CKD (chronic kidney disease), stage III   . DDD (degenerative disc disease), lumbosacral    , Spinal stenosis  . Diabetes mellitus ([place])    , Type II  . GERD (gastroesophageal reflux disease)   . HTN (hypertension)   . Hyperlipidemia   . Hypothyroidism   . IBS (irritable bowel syndrome)   . Lumbar disc disease   . Nonproliferative diabetic retinopathy associated with type 2 diabetes mellitus ([place])   . Obesity (BMI 30-39.9)   .  Peripheral neuropathy    , Associated with DM  . Scoliosis     Past Surgical History:  Procedure Laterality Date  . BACK SURGERY    . CARDIAC CATHETERIZATION    . CARDIOVERSION N/A 08/25/2018   Procedure: CARDIOVERSION;  Surgeon: Jerline Pain, MD;  Location: [facility] ENDOSCOPY;  Service: Cardiovascular;  Laterality: N/A;  . CHOLECYSTECTOMY    . CORONARY STENT INTERVENTION N/A 03/21/2018    Procedure: CORONARY STENT INTERVENTION;  Surgeon: Jettie Booze, MD;  Location: [place] CV LAB;  Service: Cardiovascular;  Laterality: N/A;  . DILATION AND CURETTAGE OF UTERUS    . HEMILAMINOTOMY LUMBAR SPINE  2002   Rt L5, with decompression and foraminotomy  . INDUCED ABORTION     , x2  . LAMINOTOMY Right   . LEFT HEART CATH AND CORONARY ANGIOGRAPHY N/A 03/21/2018   Procedure: LEFT HEART CATH AND CORONARY ANGIOGRAPHY;  Surgeon: Jettie Booze, MD;  Location: [place] CV LAB;  Service: Cardiovascular;  Laterality: N/A;  . LUMBAR [place] SURGERY  07/1999   L5-S1  . PILONIDAL CYST EXCISION    . ROTATOR CUFF REPAIR    . TONSILLECTOMY    . TOTAL ABDOMINAL HYSTERECTOMY W/ BILATERAL SALPINGOOPHORECTOMY      Social History:  reports that she has quit smoking. She quit after 35.00 years of use. She has never used smokeless tobacco. She reports that she does not drink alcohol or use drugs.  Allergies  Allergen Reactions  . Pravastatin Other (See Comments)    Muscle aches  . Prednisone Nausea And Vomiting    Family History  Problem Relation Age of Onset  . Heart failure Father   . Diabetes Father   . Coronary artery disease Mother      Prior to Admission medications   Medication Sig Start Date End Date Taking? Authorizing Provider  amLODipine (NORVASC) 2.5 MG tablet Take 1 tablet (2.5 mg total) by mouth daily. 11/01/18   Jerline Pain, MD  antiseptic oral rinse (BIOTENE) LIQD 15 mLs by Mouth Rinse route 2 (two) times daily.     [provider]  apixaban (ELIQUIS) 2.5 MG TABS tablet Take 1 tablet (2.5 mg total) by mouth 2 (two) times daily. 10/31/18   Duke, Tami Lin, PA  ASPERCREME LIDOCAINE EX Apply 1 application topically 2 (two) times daily as needed (back/feet pain).    [provider]  Carboxymethylcellulose Sod PF (RETAINE CMC) 0.5 % SOLN Place 1 drop into both eyes daily.     [provider]  clindamycin (CLEOCIN T) 1 % lotion  Apply 1 application topically 2 (two) times daily. Facial wash 11/16/13   [provider]  clopidogrel (PLAVIX) 75 MG tablet Take 1 tablet (75 mg total) by mouth daily. 03/23/18   Kroeger, Lorelee Cover., PA-C  cycloSPORINE (RESTASIS) 0.05 % ophthalmic emulsion Place 1 drop into both eyes 2 (two) times daily as needed (dry eyes).     [provider]  docusate sodium (COLACE) 100 MG capsule Take 100 mg by mouth daily.    [provider]  furosemide (LASIX) 20 MG tablet Take 1 tablet by mouth daily, may take a extra tablet daily only as needed if wt is up to 171 12/01/18   Isaiah Serge, NP  furosemide (LASIX) 20 MG tablet Take 1 tablet (20 mg total) by mouth daily. 03/10/19   Drenda Freeze, MD  gabapentin (NEURONTIN) 300 MG capsule Take 300 mg by mouth 2 (two) times daily.  [provider]  isosorbide mononitrate (IMDUR) 30 MG 24 hr tablet Take 1 tablet (30 mg total) by mouth daily. 07/06/19 10/04/19  Jerline Pain, MD  levothyroxine (SYNTHROID, LEVOTHROID) 75 MCG tablet Take 75 mcg by mouth daily.    [provider]  Melatonin 3 MG TABS Take 3 mg by mouth at bedtime as needed (sleep).    [provider]  metFORMIN (GLUCOPHAGE) 500 MG tablet Take 500 mg by mouth 2 (two) times daily with a meal.  03/17/16   [provider]  nitroGLYCERIN (NITROSTAT) 0.4 MG SL tablet DISSOLVE 1 TABLET UNDER THE TONGUE EVERY 5 MINUTES FOR 3 DOSES AS NEEDED FOR CHEST PAIN Patient taking differently: Place 0.4 mg under the tongue every 5 (five) minutes x 3 doses as needed for chest pain.  01/23/19   Cheryln Manly, NP  oxyCODONE (OXY IR/ROXICODONE) 5 MG immediate release tablet Take 5 mg by mouth at bedtime. 10/19/18   [provider]  Oxycodone HCl 10 MG TABS Take 1 tablet (10 mg total) by mouth 4 (four) times daily. 03/10/19   Drenda Freeze, MD  pantoprazole (PROTONIX) 40 MG tablet TAKE 1 TABLET(40 MG) BY MOUTH DAILY 03/27/19   Jerline Pain, MD    PREMARIN vaginal cream Place 1 Applicatorful vaginally 2 (two) times a week.  10/06/13   [provider]  ramipril (ALTACE) 5 MG capsule TAKE 1 CAPSULE(5 MG) BY MOUTH DAILY Patient taking differently: Take 5 mg by mouth daily.  07/19/18   Kroeger, Lorelee Cover., PA-C  rosuvastatin (CRESTOR) 20 MG tablet TAKE 1 TABLET(20 MG) BY MOUTH DAILY AT 6 PM 05/26/19   Kroeger, Daleen Snook M., PA-C  sodium chloride (OCEAN) 0.65 % SOLN nasal spray Place 1 spray into both nostrils as needed for congestion (dryness).    [provider]  valACYclovir (VALTREX) 500 MG tablet Take 500 mg by mouth 2 (two) times daily.     [provider]  vitamin B-12 (CYANOCOBALAMIN) 500 MCG tablet Take 500 mcg by mouth daily.    [provider]    Physical Exam: Vitals:   10/09/19 2141 10/09/19 2230 10/09/19 2321 10/09/19 2326  BP: 119/70 (!) 112/57 (!) 134/51   Pulse: 77 73 62 62  Resp: 16 18 (!) 27 13  Temp:      TempSrc:      SpO2: 94% 91% 90% 99%  Weight:      Height:       Constitutional: Elderly woman resting in bed, NAD, calm, comfortable Eyes: PERRL, lids and conjunctivae normal ENMT: Mucous membranes are moist. Posterior pharynx clear of any exudate or lesions.Normal dentition.  Neck: normal, supple, no masses. Respiratory: Faint inspiratory crackles bilaterally. Normal respiratory effort. No accessory muscle use.  Cardiovascular: Irregularly irregular, no murmurs / rubs / gallops. Trace edema proximal to the right ankle. Abdomen: no tenderness, no masses palpated. No hepatosplenomegaly. Bowel sounds positive.  Musculoskeletal: no clubbing / cyanosis. No joint deformity upper and lower extremities. Good ROM, no contractures. Normal muscle tone.  Skin: no rashes, lesions, ulcers. No induration Neurologic: CN 2-12 grossly intact. Sensation intact, Strength 5/5 in all 4.  Psychiatric: Normal judgment and insight. Alert and oriented x 3. Normal mood.     Labs on Admission: I have  personally reviewed following labs and imaging studies  CBC: Recent Labs  Lab 10/09/19 1636  WBC 10.3  HGB 11.1*  HCT 36.8  MCV 96.3  PLT 989   Basic Metabolic Panel: Recent Labs  Lab 10/09/19 1636  NA 134*  K 4.9  CL 107  CO2 18*  GLUCOSE 109*  BUN 22  CREATININE 1.38*  CALCIUM 11.1*   GFR: Estimated Creatinine Clearance: 25.8 mL/min (A) (by C-G formula based on SCr of 1.38 mg/dL (H)). Liver Function Tests: No results for input(s): AST, ALT, ALKPHOS, BILITOT, PROT, ALBUMIN in the last 168 hours. No results for input(s): LIPASE, AMYLASE in the last 168 hours. No results for input(s): AMMONIA in the last 168 hours. Coagulation Profile: No results for input(s): INR, PROTIME in the last 168 hours. Cardiac Enzymes: No results for input(s): CKTOTAL, CKMB, CKMBINDEX, TROPONINI in the last 168 hours. BNP (last 3 results) No results for input(s): PROBNP in the last 8760 hours. HbA1C: No results for input(s): HGBA1C in the last 72 hours. CBG: No results for input(s): GLUCAP in the last 168 hours. Lipid Profile: No results for input(s): CHOL, HDL, LDLCALC, TRIG, CHOLHDL, LDLDIRECT in the last 72 hours. Thyroid Function Tests: No results for input(s): TSH, T4TOTAL, FREET4, T3FREE, THYROIDAB in the last 72 hours. Anemia Panel: No results for input(s): VITAMINB12, FOLATE, FERRITIN, TIBC, IRON, RETICCTPCT in the last 72 hours. Urine analysis:    Component Value Date/Time   COLORURINE YELLOW 03/10/2019 1749   APPEARANCEUR HAZY (A) 03/10/2019 1749   LABSPEC 1.008 03/10/2019 1749   PHURINE 6.0 03/10/2019 1749   GLUCOSEU NEGATIVE 03/10/2019 1749   HGBUR SMALL (A) 03/10/2019 1749   BILIRUBINUR NEGATIVE 03/10/2019 1749   KETONESUR NEGATIVE 03/10/2019 1749   PROTEINUR NEGATIVE 03/10/2019 1749   UROBILINOGEN 0.2 05/01/2012 1913   NITRITE NEGATIVE 03/10/2019 1749   LEUKOCYTESUR LARGE (A) 03/10/2019 1749    Radiological Exams on Admission: DG Chest 2 View  Result Date:  10/09/2019 CLINICAL DATA:  Angina tack 2 days ago. Shortness of breath. Fatigue. Weakness. EXAM: CHEST - 2 VIEW COMPARISON:  10/29/2018.  04/22/2018. FINDINGS: Accentuation of expected thoracic kyphosis with lower thoracic spondylosis. Midline trachea. Normal heart size. Peripheral predominant pulmonary interstitial thickening. No lobar consolidation. IMPRESSION: Slowly progressive pulmonary interstitial thickening, peripheral predominant. Although this could be related to pulmonary venous congestion, interstitial lung disease cannot be excluded. If the patient's symptoms are chronic and not felt to be related to congestive failure, consider high-resolution chest CT. Electronically Signed   By: Abigail Miyamoto M.D.   On: 10/09/2019 17:05    EKG: Independently reviewed.  Atrial fibrillation, rate 79.  Similar to prior.  Assessment/Plan Principal Problem:   Unstable angina (HCC) Active Problems:   CAD (coronary artery disease)   Hypothyroidism   Type 2 diabetes mellitus with vascular disease (HCC)   Hyperlipidemia associated with type 2 diabetes mellitus (HCC)   Chronic diastolic CHF (congestive heart failure) (HCC)   Essential hypertension   Persistent atrial fibrillation (HCC)   CKD (chronic kidney disease) stage 3, GFR 30-59 ml/min  Kristine Oliver is a 87 y.o. female with medical history significant for CAD s/p PCI/DES, persistent atrial fibrillation on Eliquis, chronic diastolic CHF, CKD stage III, type 2 diabetes, hypertension, hyperlipidemia, hypothyroidism, and chronic pain who is admitted with unstable angina.   Unstable angina/Hx CAD s/p PCI/DES: Patient with recurrent unstable anginal episodes with increasing frequency and associated with minimal activity. She is currently chest pain-free. High-sensitivity troponin I is negative x2. EKG shows atrial fibrillation without acute changes. -Continue Plavix 75 mg daily -Continue Imdur 30 mg daily, sublingual nitroglycerin as needed -Continue  rosuvastatin -Cardiology consulted, appreciate further recommendations  Persistent atrial fibrillation: Remains in atrial fibrillation with rate  controlled. Chads vas score 7, she is on Eliquis 2.5 mg twice daily. Not on beta-blocker/AV nodal blockers due to history of bradycardia. Has declined PPM in the past. -Continue Eliquis  Chronic diastolic CHF: EF 96-22%, W9NL by TEE 03/19/2018. Chest x-ray with question of pulmonary venous congestion versus interstitial lung changes. Has trace edema to distal right lower extremity.  Takes Lasix 20 mg daily plus extra tablet as needed. -Check BNP -Will give IV Lasix 20 mg once and monitor response -Strict I/O's, daily weights  CKD stage III: Stable, renal function improved relative to most recent labs.  Type 2 diabetes: Holding Metformin, continue sensitive SSI while in hospital.  Hypertension: Continue amlodipine, ramipril, Imdur as BP tolerates.  Hypothyroidism: Continue Synthroid.  Hyperlipidemia: Continue rosuvastatin.  Chronic pain: Continue home oxycodone 10 mg 4 times daily and OxyIR at bedtime.  Will change to as needed with hold parameters.  DVT prophylaxis: Eliquis Code Status: DNR, confirmed with patient Family Communication: Discussed with patient, she has discussed with family Disposition Plan: From home, discharge pending further chest pain work-up/evaluation. Consults called: Cardiology Admission status: Observation   Zada Finders MD [facility]  If 7PM-7AM, please contact night-coverage [email or web address]  10/09/2019, 11:48 PM

## 2019-10-10 DIAGNOSIS — E669 Obesity, unspecified: Secondary | ICD-10-CM | POA: Diagnosis not present

## 2019-10-10 DIAGNOSIS — G8929 Other chronic pain: Secondary | ICD-10-CM | POA: Diagnosis not present

## 2019-10-10 DIAGNOSIS — I208 Other forms of angina pectoris: Secondary | ICD-10-CM | POA: Diagnosis present

## 2019-10-10 DIAGNOSIS — E113299 Type 2 diabetes mellitus with mild nonproliferative diabetic retinopathy without macular edema, unspecified eye: Secondary | ICD-10-CM | POA: Diagnosis not present

## 2019-10-10 DIAGNOSIS — I252 Old myocardial infarction: Secondary | ICD-10-CM | POA: Diagnosis not present

## 2019-10-10 DIAGNOSIS — I2510 Atherosclerotic heart disease of native coronary artery without angina pectoris: Secondary | ICD-10-CM

## 2019-10-10 DIAGNOSIS — Z66 Do not resuscitate: Secondary | ICD-10-CM | POA: Diagnosis not present

## 2019-10-10 DIAGNOSIS — N1832 Chronic kidney disease, stage 3b: Secondary | ICD-10-CM | POA: Diagnosis not present

## 2019-10-10 DIAGNOSIS — Z79899 Other long term (current) drug therapy: Secondary | ICD-10-CM | POA: Diagnosis not present

## 2019-10-10 DIAGNOSIS — I4819 Other persistent atrial fibrillation: Secondary | ICD-10-CM | POA: Diagnosis not present

## 2019-10-10 DIAGNOSIS — I5032 Chronic diastolic (congestive) heart failure: Secondary | ICD-10-CM

## 2019-10-10 DIAGNOSIS — M5137 Other intervertebral disc degeneration, lumbosacral region: Secondary | ICD-10-CM | POA: Diagnosis not present

## 2019-10-10 DIAGNOSIS — Z8249 Family history of ischemic heart disease and other diseases of the circulatory system: Secondary | ICD-10-CM | POA: Diagnosis not present

## 2019-10-10 DIAGNOSIS — Z833 Family history of diabetes mellitus: Secondary | ICD-10-CM | POA: Diagnosis not present

## 2019-10-10 DIAGNOSIS — K219 Gastro-esophageal reflux disease without esophagitis: Secondary | ICD-10-CM | POA: Diagnosis not present

## 2019-10-10 DIAGNOSIS — Z955 Presence of coronary angioplasty implant and graft: Secondary | ICD-10-CM | POA: Diagnosis not present

## 2019-10-10 DIAGNOSIS — Z87891 Personal history of nicotine dependence: Secondary | ICD-10-CM | POA: Diagnosis not present

## 2019-10-10 DIAGNOSIS — E785 Hyperlipidemia, unspecified: Secondary | ICD-10-CM

## 2019-10-10 DIAGNOSIS — I130 Hypertensive heart and chronic kidney disease with heart failure and stage 1 through stage 4 chronic kidney disease, or unspecified chronic kidney disease: Secondary | ICD-10-CM | POA: Diagnosis not present

## 2019-10-10 DIAGNOSIS — E1169 Type 2 diabetes mellitus with other specified complication: Secondary | ICD-10-CM | POA: Diagnosis not present

## 2019-10-10 DIAGNOSIS — Z7901 Long term (current) use of anticoagulants: Secondary | ICD-10-CM | POA: Diagnosis not present

## 2019-10-10 DIAGNOSIS — Z6831 Body mass index (BMI) 31.0-31.9, adult: Secondary | ICD-10-CM | POA: Diagnosis not present

## 2019-10-10 DIAGNOSIS — E039 Hypothyroidism, unspecified: Secondary | ICD-10-CM | POA: Diagnosis not present

## 2019-10-10 DIAGNOSIS — I25110 Atherosclerotic heart disease of native coronary artery with unstable angina pectoris: Secondary | ICD-10-CM | POA: Diagnosis not present

## 2019-10-10 DIAGNOSIS — I200 Unstable angina: Secondary | ICD-10-CM | POA: Diagnosis not present

## 2019-10-10 DIAGNOSIS — I10 Essential (primary) hypertension: Secondary | ICD-10-CM

## 2019-10-10 DIAGNOSIS — Z20822 Contact with and (suspected) exposure to covid-19: Secondary | ICD-10-CM | POA: Diagnosis not present

## 2019-10-10 DIAGNOSIS — K589 Irritable bowel syndrome without diarrhea: Secondary | ICD-10-CM | POA: Diagnosis not present

## 2019-10-10 LAB — CBC
HCT: 35.4 % — ABNORMAL LOW (ref 36.0–46.0)
Hemoglobin: 10.7 g/dL — ABNORMAL LOW (ref 12.0–15.0)
MCH: 28.9 pg (ref 26.0–34.0)
MCHC: 30.2 g/dL (ref 30.0–36.0)
MCV: 95.7 fL (ref 80.0–100.0)
Platelets: 263 10*3/uL (ref 150–400)
RBC: 3.7 MIL/uL — ABNORMAL LOW (ref 3.87–5.11)
RDW: 17.3 % — ABNORMAL HIGH (ref 11.5–15.5)
WBC: 6.8 10*3/uL (ref 4.0–10.5)
nRBC: 0 % (ref 0.0–0.2)

## 2019-10-10 LAB — BASIC METABOLIC PANEL
Anion gap: 6 (ref 5–15)
BUN: 19 mg/dL (ref 8–23)
CO2: 24 mmol/L (ref 22–32)
Calcium: 11.5 mg/dL — ABNORMAL HIGH (ref 8.9–10.3)
Chloride: 109 mmol/L (ref 98–111)
Creatinine, Ser: 1.31 mg/dL — ABNORMAL HIGH (ref 0.44–1.00)
GFR calc Af Amer: 43 mL/min — ABNORMAL LOW (ref >60)
GFR calc non Af Amer: 37 mL/min — ABNORMAL LOW (ref >60)
Glucose, Bld: 134 mg/dL — ABNORMAL HIGH (ref 70–99)
Potassium: 4.8 mmol/L (ref 3.5–5.1)
Sodium: 139 mmol/L (ref 135–145)

## 2019-10-10 LAB — HEMOGLOBIN A1C
Hgb A1c MFr Bld: 6.5 % — ABNORMAL HIGH (ref 4.8–5.6)
Mean Plasma Glucose: 139.85 mg/dL

## 2019-10-10 LAB — CBG MONITORING, ED: Glucose-Capillary: 89 mg/dL (ref 70–99)

## 2019-10-10 LAB — GLUCOSE, CAPILLARY
Glucose-Capillary: 118 mg/dL — ABNORMAL HIGH (ref 70–99)
Glucose-Capillary: 132 mg/dL — ABNORMAL HIGH (ref 70–99)
Glucose-Capillary: 128 mg/dL — ABNORMAL HIGH (ref 70–99)

## 2019-10-10 LAB — APTT: aPTT: 105 seconds — ABNORMAL HIGH (ref 24–36)

## 2019-10-10 LAB — TSH: TSH: 1.193 u[IU]/mL (ref 0.350–4.500)

## 2019-10-10 LAB — SARS CORONAVIRUS 2 (TAT 6-24 HRS): SARS Coronavirus 2: NEGATIVE

## 2019-10-10 LAB — BRAIN NATRIURETIC PEPTIDE: B Natriuretic Peptide: 247.9 pg/mL — ABNORMAL HIGH (ref 0.0–100.0)

## 2019-10-10 MED ORDER — ISOSORBIDE MONONITRATE ER 30 MG PO TB24: 30.0000 mg | ORAL_TABLET | Freq: Every day | ORAL | Status: DC

## 2019-10-10 MED ORDER — FUROSEMIDE 10 MG/ML IJ SOLN
20.0000 mg | Freq: Once | INTRAMUSCULAR | Status: AC
  Administered 2019-10-10: 20 mg via INTRAVENOUS
  Filled 2019-10-10: qty 2

## 2019-10-10 MED ORDER — POLYETHYLENE GLYCOL 3350 17 G PO PACK
17.0000 g | PACK | Freq: Every day | ORAL | Status: DC
  Administered 2019-10-10 – 2019-10-11 (×2): 17 g via ORAL
  Filled 2019-10-10 (×2): qty 1

## 2019-10-10 MED ORDER — DOCUSATE SODIUM 100 MG PO CAPS
100.0000 mg | ORAL_CAPSULE | Freq: Every day | ORAL | Status: DC
  Administered 2019-10-10 – 2019-10-11 (×2): 100 mg via ORAL
  Filled 2019-10-10 (×2): qty 1

## 2019-10-10 MED ORDER — HEPARIN (PORCINE) 25000 UT/250ML-% IV SOLN
900.0000 [IU]/h | INTRAVENOUS | Status: DC
  Administered 2019-10-10: 1000 [IU]/h via INTRAVENOUS
  Administered 2019-10-11: 900 [IU]/h via INTRAVENOUS
  Filled 2019-10-10 (×2): qty 250

## 2019-10-10 MED ORDER — HYPROMELLOSE (GONIOSCOPIC) 2.5 % OP SOLN
1.0000 [drp] | Freq: Four times a day (QID) | OPHTHALMIC | Status: DC | PRN
  Administered 2019-10-11: 1 [drp] via OPHTHALMIC
  Filled 2019-10-10: qty 15

## 2019-10-10 MED ORDER — LIDOCAINE 5 % EX PTCH
1.0000 | MEDICATED_PATCH | CUTANEOUS | Status: DC
  Administered 2019-10-10: 1 via TRANSDERMAL
  Filled 2019-10-10: qty 1

## 2019-10-10 MED ORDER — NAPHAZOLINE-GLYCERIN 0.012-0.2 % OP SOLN: 1.0000 [drp] | Freq: Four times a day (QID) | OPHTHALMIC | Status: DC | PRN

## 2019-10-10 NOTE — Progress Notes (Signed)
Spoke with patient multiple times regarding reasons she is in the hospital and plan of care. Wants to speak with MD. MD paged and MD stated he would call the pt in the room. Pt upset the air is dry and requesting a humidifier, explained to pt we don't have a humidified but offered options for her dry nose and skin. Appears very anxious also offered something for anxiety pt declined all offers. Helped pt up to sit on edge of bed for dinner warm blanket provided.

## 2019-10-10 NOTE — ED Notes (Signed)
Lunch Tray Ordered @ 1038.  

## 2019-10-10 NOTE — ED Notes (Signed)
Pt assisted back into bed, given a warm blanket. Pt reminded that we are waiting for a bed to come available upstairs in the main part of the hospital.

## 2019-10-10 NOTE — Progress Notes (Addendum)
Progress Note  Patient Name: Kristine Oliver Date of Encounter: 10/10/2019  Primary Cardiologist: [name], MD   Subjective   Patient is chest pain free but says she is very cold. Remains in rate controlled Afib. On supplemental O2.   Inpatient Medications    Scheduled Meds: . amLODipine  2.5 mg Oral Daily  . apixaban  2.5 mg Oral BID  . clopidogrel  75 mg Oral Daily  . insulin aspart  0-9 Units Subcutaneous TID WC  . isosorbide mononitrate  30 mg Oral Daily  . levothyroxine  75 mcg Oral Q0600  . pantoprazole  40 mg Oral Daily  . ramipril  5 mg Oral Daily  . rosuvastatin  20 mg Oral QPM  . sodium chloride flush  3 mL Intravenous Once  . sodium chloride flush  3 mL Intravenous Q12H   Continuous Infusions:  PRN Meds: acetaminophen **OR** acetaminophen, albuterol, nitroGLYCERIN, ondansetron **OR** ondansetron (ZOFRAN) IV, oxyCODONE, oxyCODONE   Vital Signs    Vitals:   10/10/19 0500 10/10/19 0643 10/10/19 0644 10/10/19 0700  BP: (!) 124/56 138/75  (!) 139/45  Pulse: 72 80 67 76  Resp: 16 18 19 14   Temp:      TempSrc:      SpO2: 99% 99% 99% 99%  Weight:      Height:       No intake or output data in the 24 hours ending 10/10/19 0749 Last 3 Weights 10/09/2019 07/06/2019 12/15/2018  Weight (lbs) 165 lb 170 lb 168 lb 9.6 oz  Weight (kg) 74.844 kg 77.111 kg 76.476 kg     Telemetry    Afib, Hr 70-80s - Personally Reviewed  ECG    Afib HR 79 bpm, nonspecific ST/T wave changes- Personally Reviewed  Physical Exam   GEN: No acute distress.   Neck: minimal JVD Cardiac: Irreg Irreg, no murmurs, rubs, or gallops.  Respiratory: crackles right base; 1L O2 GI: Soft, nontender, non-distended  MS: No edema; No deformity. Neuro:  Nonfocal  Psych: Normal affect   Labs    High Sensitivity Troponin:   Recent Labs  Lab 10/09/19 1636 10/09/19 1923  TROPONINIHS 13 15     Chemistry Recent Labs  Lab 10/09/19 1636 10/10/19 0248  NA 134* 139  K 4.9 4.8  CL 107 109   CO2 18* 24  GLUCOSE 109* 134*  BUN 22 19  CREATININE 1.38* 1.31*  CALCIUM 11.1* 11.5*  GFRNONAA 35* 37*  GFRAA 40* 43*  ANIONGAP 9 6     Hematology Recent Labs  Lab 10/09/19 1636 10/10/19 0248  WBC 10.3 6.8  RBC 3.82* 3.70*  HGB 11.1* 10.7*  HCT 36.8 35.4*  MCV 96.3 95.7  MCH 29.1 28.9  MCHC 30.2 30.2  RDW 17.3* 17.3*  PLT 282 263   BNP Recent Labs  Lab 10/09/19 1636  BNP 247.9*    DDimer No results for input(s): DDIMER in the last 168 hours.   Radiology    DG Chest 2 View  Result Date: 10/09/2019 CLINICAL DATA:  Angina tack 2 days ago. Shortness of breath. Fatigue. Weakness. EXAM: CHEST - 2 VIEW COMPARISON:  10/29/2018.  04/22/2018. FINDINGS: Accentuation of expected thoracic kyphosis with lower thoracic spondylosis. Midline trachea. Normal heart size. Peripheral predominant pulmonary interstitial thickening. No lobar consolidation. IMPRESSION: Slowly progressive pulmonary interstitial thickening, peripheral predominant. Although this could be related to pulmonary venous congestion, interstitial lung disease cannot be excluded. If the patient's symptoms are chronic and not felt to be related to  congestive failure, consider high-resolution chest CT. Electronically Signed   By: Abigail Miyamoto M.D.   On: 10/09/2019 17:05   Cardiac Studies   Cardiac cath 03/2018  Prox RCA to Mid RCA lesion is 25% stenosed.  Mid Cx lesion is 90% stenosed.  A drug-eluting stent was successfully placed using a STENT SIERRA 2.75 X 28 MM.  Post intervention, there is a 0% residual stenosis.  Ost Cx to Prox Cx lesion is 50% stenosed.  Previously placed Mid LAD stent (unknown type) is widely patent.  Prox LAD lesion is 25% stenosed.  LV end diastolic pressure is mildly elevated. LVEDP 17 mm Hg.  There is no aortic valve stenosis.  Vasospasm and discomfort in the right arm from the radial approach.   Recommend uninterrupted dual antiplatelet therapy with Aspirin 81mg  daily and  Clopidogrel 75mg  daily for a minimum of 12 months (ACS - Class I recommendation).   Patient was already on Plavix.  COuld consdier Brilinta instead if bleeding risk is low.   Continue aggressive secondary prevention.   Echo 03/2018 Study Conclusions   - Left ventricle: The cavity size was normal. There was mild  concentric hypertrophy. Systolic function was normal. The  estimated ejection fraction was in the range of 60% to 65%. Wall  motion was normal; there were no regional wall motion  abnormalities. Doppler parameters are consistent with abnormal  left ventricular relaxation (grade 1 diastolic dysfunction).  Doppler parameters are consistent with elevated ventricular  end-diastolic filling pressure.  - Aortic valve: There was trivial regurgitation.  - Mitral valve: There was mild regurgitation.  - Left atrium: The atrium was mildly dilated.  - Right ventricle: Systolic function was normal.  - Tricuspid valve: There was mild regurgitation.  - Pulmonic valve: There was no regurgitation.  - Pulmonary arteries: Systolic pressure was within the normal  range.  - Inferior vena cava: The vessel was normal in size.  - Pericardium, extracardiac: There was no pericardial effusion.    Patient Profile     86 y.o. female with a history of CAD with NSTEMI in 2005 s/p DES to LAD and in 2019 s/p DES to mCx, persistent AF on Eliquis, HTN, DM2, CHF with preserved EF, chronotropic incompetence (declined PPM), CKD III, and chronic pain who is being seen for chest pain.   Assessment & Plan    CAD with chronic angina/  S/p DES midCx and mid LAD - Patient has had intermittent history of chest pain despite revascularization that is improved with nitrates - Patient presented with her known angina and fatigue. EKG with rate controlled Afib. HS troponin 13 > 15. - Cath 03/2018 showed prox to mid RCA lesion 25% stenosed, ost to prox Cx lesion 50%, prox LAD 25% stenosed - Patient takes  plavix 75 mg daily and Imdur 30 mg daily - NO BB 2/2 bradycardia - CP is patient's typical angina. Seems to have anxiety component as well. Afib could be playing a part. Can consider titrating Imdur. Will discuss any further procedure with MD.   Persistent Afib s/p DCCV in 08/2018 - Patient has been rate controlled in afib.  - Eliquis 2.5mg  BID for stroke prophylaxis - Afib might be contributing to fatigue - Can also consider Amiodarone for rhythm control. Has not been started in the past for possible side effects and bradycardia  Chronic diastolic CHF - EF 40-98%, G1DD 03/2018 - CXR with pulmonary venous congestion and BNP 247 - She takes lasix 20 mg daily at baseline - Given  IV lasix 20 mg in the ER - strict I/Os, daily weights - still has some crackles on exam. Would give another Lasix IV 20g  Sinus bradycardia - Has seen EP for PPM consult and did not plan to pursue PPM  HTN - amlodipine 2.5mg , Imdur 30 mg, ramipril 5 mg, lasix 20 mg daily - pressures stable  CKD stage III - stable - creatinine 1.38>1.31  Hypothyroidism - synthroid  Hyperlipidemia - Rosuvastatin - LDL 47 in 10/2018  Chronic pain - on Oxycodon and OxyIR  For questions or updates, please contact [facility] Please consult [email or web address] for contact info under     Signed, Cadence Ninfa Meeker, PA-C  10/10/2019, 7:49 AM    The patient was seen, examined and discussed with Cadence Ninfa Meeker, PA-C   and I agree with the above.   86 y.o. female with a history of CAD with NSTEMI in 2005 s/p DES to LAD and in 2019 s/p DES to mCx, persistent AF on Eliquis, HTN, DM2, CHF with preserved EF, chronotropic incompetence (declined PPM), CKD III, and chronic pain who is being seen for chest pain.  Her pain seems to be typical, it occurs on exertion and resolves with nitroglycerin.  Her EKG is unchanged showing atrial fibrillation, rate controlled, nonspecific ST-T wave abnormalities, unchanged from prior.  Her troponin is  minimally elevated at 13 and 15 with flat trend.  Creatinine is improved from baseline currently 1.3 her prior one was 1.6.  Given her advanced age and CKD stage III she is not an ideal candidate for cath, will try to start her on Imdur today and have her walk around see how she does, if she has ongoing chest pain we will refer her for cardiac catheterization.  Her last one was in 2019 that showed 90% stenosis in mid circumflex that was treated with drug-eluting stent.  She also has ostial 50% stenosis in the circumflex, previously placed stent in mid LAD was patent.  RCA only had minimal stenosis.  We will hold Eliquis and start heparin drip in case she needs a cath tomorrow.  Ena Dawley, MD 10/10/2019

## 2019-10-10 NOTE — Progress Notes (Signed)
ANTICOAGULATION CONSULT NOTE - Initial Consult  Pharmacy Consult for heparin Indication: atrial fibrillation  Allergies  Allergen Reactions  . Pravastatin Other (See Comments)    Muscle aches  . Prednisone Nausea And Vomiting    Patient Measurements: Height: 4\' 11"  (149.9 cm) Weight: 159 lb 9.8 oz (72.4 kg) IBW/kg (Calculated) : 43.2 Heparin Dosing Weight: 72kg  Vital Signs: Temp: 97.5 F (36.4 C) (03/09 1102) Temp Source: Oral (03/09 1102) BP: 126/49 (03/09 1102) Pulse Rate: 77 (03/09 1102)  Labs: Recent Labs    10/09/19 1636 10/09/19 1923 10/10/19 0248  HGB 11.1*  --  10.7*  HCT 36.8  --  35.4*  PLT 282  --  263  CREATININE 1.38*  --  1.31*  TROPONINIHS 13 15  --     Estimated Creatinine Clearance: 26.7 mL/min (A) (by C-G formula based on SCr of 1.31 mg/dL (H)).   Medical History: Past Medical History:  Diagnosis Date  . Anxiety   . Bradycardia   . CAD (coronary artery disease)    a. 08/2004 NSTEMI/PCI: LAD 50m/d (2.75 x 28 mm Taxus 2 DES);  b. 10/2010 Cath: LM nl, LAD patent stents w/ jailed septal, LCX 30, RCA 30; c. 06/2013 MV: No ischemia. Small fixed anteroseptal defect, ? scar vs atten. EF 62%; d. 03/2016 MV: EF 57%, Low risk.  . Chronic diastolic heart failure ([place])    a. Echo 7/13:  mod LVH, EF 65-70%, vigorous LV, Gr 1 diast dysfn, mild LAE; b. 05/2017 Echo: EF 60-65%, mod LVH, no rwma, Gr1 DD, mildly dil LA.  Marland Kitchen Chronic pain disorder   . CKD (chronic kidney disease), stage III   . DDD (degenerative disc disease), lumbosacral    , Spinal stenosis  . Diabetes mellitus ([place])    , Type II  . GERD (gastroesophageal reflux disease)   . HTN (hypertension)   . Hyperlipidemia   . Hypothyroidism   . IBS (irritable bowel syndrome)   . Lumbar disc disease   . Nonproliferative diabetic retinopathy associated with type 2 diabetes mellitus ([place])   . Obesity (BMI 30-39.9)   . Peripheral neuropathy    , Associated with DM  . Scoliosis      Assessment: 30  yoF admitted with CP. Pt on apixaban PTA for hx AFib, last dose prior to admit on 3/8 in am (none given this admit). Pharmacy asked to transition to IV heparin in anticipaiton of cardiac cath tomorrow.  Goal of Therapy:  Heparin level 0.3-0.7 units/ml aPTT 66-102 seconds Monitor platelets by anticoagulation protocol: Yes   Plan:  -Hold apixaban -Begin IV heparin 1000 units/h no bolus -Check 8hr aPTT -Daily aPTT, heparin level, CBC   Arrie Senate, PharmD, BCPS Clinical Pharmacist [phone] Please check AMION for all [place] numbers 10/10/2019

## 2019-10-10 NOTE — Progress Notes (Signed)
Pt yelling for help, trying to make it to the bathroom alone. Did not call for help refusing bed alarm,  non skid socks and bedside commode use. Pt educated and continues to refuse.

## 2019-10-10 NOTE — ED Notes (Signed)
Report called to 3E RN. 

## 2019-10-10 NOTE — Progress Notes (Signed)
Progress Note    Kristine Oliver  NFA:[id number] DOB: 1931/02/24  DOA: 10/09/2019 PCP: Lavone Orn, MD    Brief Narrative:   Chief complaint: home  Medical records reviewed and are as summarized below:  Kristine Oliver is an 86 y.o. female with a past medical history that includes CAD with N STEMI 2005 status post DES to LAD and in 2019 status post DES to [facility], persistent A. fib on Eliquis, hypertension, diabetes, chronotropic incompetence (declined PPM), chronic kidney disease stage III, chronic heart failure presents to the emergency department chief complaint chest pain.  She was evaluated by cardiology in the emergency department who opined negative troponins, inconsistent chest pain and no EKG changes recommended medical management with admission for observation.  Assessment/Plan:   Principal Problem:   Unstable angina (HCC) Active Problems:   CAD (coronary artery disease)   Essential hypertension   Persistent atrial fibrillation (HCC)   Hypothyroidism   Type 2 diabetes mellitus with vascular disease (HCC)   Hyperlipidemia associated with type 2 diabetes mellitus (HCC)   Chronic diastolic CHF (congestive heart failure) (HCC)   CKD (chronic kidney disease) stage 3, GFR 30-59 ml/min  #1.  Chest pain/history of CAD with chronic angina/status post DES mid CX and mid LAD.  Pain-free this morning.  Evaluated by cardiology given this morning who opined her pain is typical that occurs on exertion and resolves with nitroglycerin, EKG unchanged showing A. fib rate controlled consistent with prior, troponins minimally elevated at 13 and 15 with flat trend and given her age and chronic kidney disease not an ideal candidate for cath and recommend starting Imdur today and evaluate pain with exertion and if she continues to have pain consider cardiac cath.  They also recommend starting a heparin drip in case she needs a cath tomorrow -Heparin per pharmacy as requested by cardiology -Appreciate  cardiology assistance -Monitor on telemetry -No beta-blocker secondary to bradycardia -Resume home Imdur  #2.  Persistent A. fib status post DCCV January of last year.  EKG as noted above.  Home medications include Eliquis.  Rate controlled. -Appreciate cardiology input  #3.  Chronic diastolic heart failure.  Recent echo with an EF of 46% grade 1 diastolic dysfunction.  Chest x-ray with pulmonary venous congestion.  BNP 247.  Home medications include Lasix 20 mg daily.  She was given 20 mg of Lasix IV in the emergency department.  Evaluated by cardiology this morning who recommended another 20 mg of Lasix IV -20 mg Lasix IV -Monitor intake and output -Obtain daily weights -See #1  #4.  Sinus bradycardia.  Chart review indicates she has seen EP in the past.  Patient did not pursue PPM.  Heart rate range during this hospitalization 65-80 -Monitor on telemetry -No beta-blocker per cardiology  #5.  Hypertension.  Home medications include amlodipine, Imdur, ramipril and Lasix.  Blood pressures on the low end of normal but stable. -IV Lasix as noted above -Continue home Norvasc -Resume Imdur as noted above -Ramipril  #6.  Chronic kidney disease stage III.  Creatinine appears to be at baseline. -Monitor intake and output -Hold nephrotoxins as able  #7.  Diabetes type 2.  Home medications include Metformin which is on hold for now.  Serum glucose 109 on admission.  Hemoglobin A1c 6.5 -Siding scale insulin for optimal control -Recheck in the morning  #8.  Hypothyroidism. TSH 1.16 last year -obtain TSH -Continue home Synthroid  #9.  Hyperlipidemia -Continue home statin  #10.  Chronic  pain.  Home medications include oxycodone 10 mg 4 times daily and OxyIR at bedtime.  On admission this was changed to as needed with parameters -Monitor       Family Communication/Anticipated D/C date and plan/Code Status   DVT prophylaxis: Lovenox ordered. Code Status: dnr Family Communication:  patient Disposition Plan: home   Medical Consultants:    [facility].   Anti-Infectives:    None  Subjective:   Patient awake watching TV.  Denies chest pain.  States "I have not had chest pain since I got here".  She does complain of the room being cold  Objective:    Vitals:   10/10/19 0800 10/10/19 1019 10/10/19 1102 10/10/19 1146  BP: (!) 133/58 130/68 (!) 126/49 (!) 121/53  Pulse: 69  77 75  Resp: 12  16 16   Temp:   (!) 97.5 F (36.4 C) 98.2 F (36.8 C)  TempSrc:   Oral Oral  SpO2: 100%  93% 93%  Weight:   72.4 kg   Height:   4\' 11"  (1.499 m)    No intake or output data in the 24 hours ending 10/10/19 1235 Filed Weights   10/09/19 1629 10/10/19 1102  Weight: 74.8 kg 72.4 kg    Exam: General: Slightly pale alert calm cooperative no acute distress CV: Irregularly irregular no murmur gallop or rub no lower extremity edema Respirations: No increased work of breathing breath sounds are somewhat distant respirations somewhat shallow I hear fine crackles bilateral bases.  No wheeze no rhonchi Abdomen: Nondistended soft positive bowel sounds throughout nontender to palpation no guarding or rebounding Musculoskeletal: Joints without swelling/erythema full range of motion nontender to palpation Neuro: Alert and oriented x3 speech clear facial symmetry cranial nerves II through XII grossly intact  Data Reviewed:   I have personally reviewed following labs and imaging studies:  Labs: Labs show the following:   Basic Metabolic Panel: Recent Labs  Lab 10/09/19 1636 10/10/19 0248  NA 134* 139  K 4.9 4.8  CL 107 109  CO2 18* 24  GLUCOSE 109* 134*  BUN 22 19  CREATININE 1.38* 1.31*  CALCIUM 11.1* 11.5*   GFR Estimated Creatinine Clearance: 26.7 mL/min (A) (by C-G formula based on SCr of 1.31 mg/dL (H)). Liver Function Tests: No results for input(s): AST, ALT, ALKPHOS, BILITOT, PROT, ALBUMIN in the last 168 hours. No results for input(s): LIPASE,  AMYLASE in the last 168 hours. No results for input(s): AMMONIA in the last 168 hours. Coagulation profile No results for input(s): INR, PROTIME in the last 168 hours.  CBC: Recent Labs  Lab 10/09/19 1636 10/10/19 0248  WBC 10.3 6.8  HGB 11.1* 10.7*  HCT 36.8 35.4*  MCV 96.3 95.7  PLT 282 263   Cardiac Enzymes: No results for input(s): CKTOTAL, CKMB, CKMBINDEX, TROPONINI in the last 168 hours. BNP (last 3 results) No results for input(s): PROBNP in the last 8760 hours. CBG: Recent Labs  Lab 10/10/19 0734 10/10/19 1110  GLUCAP 89 118*   D-Dimer: No results for input(s): DDIMER in the last 72 hours. Hgb A1c: Recent Labs    10/09/19 1636  HGBA1C 6.5*   Lipid Profile: No results for input(s): CHOL, HDL, LDLCALC, TRIG, CHOLHDL, LDLDIRECT in the last 72 hours. Thyroid function studies: No results for input(s): TSH, T4TOTAL, T3FREE, THYROIDAB in the last 72 hours.  Invalid input(s): FREET3 Anemia work up: No results for input(s): VITAMINB12, FOLATE, FERRITIN, TIBC, IRON, RETICCTPCT in the last 72 hours. Sepsis Labs: Recent Labs  Lab 10/09/19 1636 10/10/19 0248  WBC 10.3 6.8    Microbiology Recent Results (from the past 240 hour(s))  SARS CORONAVIRUS 2 (TAT 6-24 HRS) Nasopharyngeal Nasopharyngeal Swab     Status: None   Collection Time: 10/09/19 11:23 PM   Specimen: Nasopharyngeal Swab  Result Value Ref Range Status   SARS Coronavirus 2 NEGATIVE NEGATIVE Final    Comment: (NOTE) SARS-CoV-2 target nucleic acids are NOT DETECTED. The SARS-CoV-2 RNA is generally detectable in upper and lower respiratory specimens during the acute phase of infection. Negative results do not preclude SARS-CoV-2 infection, do not rule out co-infections with other pathogens, and should not be used as the sole basis for treatment or other patient management decisions. Negative results must be combined with clinical observations, patient history, and epidemiological information. The  expected result is Negative. Fact Sheet for Patients: SugarRoll.be Fact Sheet for Healthcare Providers: https://www.woods-mathews.com/ This test is not yet approved or cleared by the [place] FDA and  has been authorized for detection and/or diagnosis of SARS-CoV-2 by FDA under an Emergency Use Authorization (EUA). This EUA will remain  in effect (meaning this test can be used) for the duration of the COVID-19 declaration under Section 56 4(b)(1) of the Act, 21 U.S.C. section 360bbb-3(b)(1), unless the authorization is terminated or revoked sooner. Performed at [facility], [place] [address], [place], [place] [address]     Procedures and diagnostic studies:  DG Chest 2 View  Result Date: 10/09/2019 CLINICAL DATA:  Angina tack 2 days ago. Shortness of breath. Fatigue. Weakness. EXAM: CHEST - 2 VIEW COMPARISON:  10/29/2018.  04/22/2018. FINDINGS: Accentuation of expected thoracic kyphosis with lower thoracic spondylosis. Midline trachea. Normal heart size. Peripheral predominant pulmonary interstitial thickening. No lobar consolidation. IMPRESSION: Slowly progressive pulmonary interstitial thickening, peripheral predominant. Although this could be related to pulmonary venous congestion, interstitial lung disease cannot be excluded. If the patient's symptoms are chronic and not felt to be related to congestive failure, consider high-resolution chest CT. Electronically Signed   By: Abigail Miyamoto M.D.   On: 10/09/2019 17:05    Medications:   . amLODipine  2.5 mg Oral Daily  . clopidogrel  75 mg Oral Daily  . docusate sodium  100 mg Oral Daily  . insulin aspart  0-9 Units Subcutaneous TID WC  . isosorbide mononitrate  30 mg Oral Daily  . levothyroxine  75 mcg Oral Q0600  . lidocaine  1 patch Transdermal Q24H  . pantoprazole  40 mg Oral Daily  . polyethylene glycol  17 g Oral Daily  . ramipril  5 mg Oral Daily  . rosuvastatin  20 mg Oral  QPM  . sodium chloride flush  3 mL Intravenous Once  . sodium chloride flush  3 mL Intravenous Q12H   Continuous Infusions: . heparin 1,000 Units/hr (10/10/19 1145)     LOS: 0 days   Radene Gunning NP [facility]   How to contact the [facility] Attending or Consulting provider [place] or covering provider during after hours [place], for this patient?  1. Check the care team in [facility] and look for a) attending/consulting TR[facility] provider listed and b) the [facility] team listed 2. Log into [email or web address] and use [place]'s universal password to access. If you do not have the password, please contact the hospital operator. 3. Locate the [facility] provider you are looking for under [facility] and page to a number that you can be directly reached. 4. If you still have difficulty reaching  the provider, please page the [facility] (Director on Call) for the Hospitalists listed on amion for assistance.  10/10/2019, 12:35 PM

## 2019-10-10 NOTE — ED Notes (Signed)
Pt assisted to restroom and returned to bed. Pt covered back up, provided comfort, and provided breakfast.

## 2019-10-10 NOTE — Progress Notes (Signed)
[place] for heparin Indication: atrial fibrillation  Allergies  Allergen Reactions  . Pravastatin Other (See Comments)    Muscle aches  . Prednisone Nausea And Vomiting    Patient Measurements: Height: 4\' 11"  (149.9 cm) Weight: 159 lb 9.8 oz (72.4 kg) IBW/kg (Calculated) : 43.2 Heparin Dosing Weight: 72kg  Vital Signs: Temp: 98.1 F (36.7 C) (03/09 1605) Temp Source: Oral (03/09 1605) BP: 105/56 (03/09 1605) Pulse Rate: 80 (03/09 1605)  Labs: Recent Labs    10/09/19 1636 10/09/19 1923 10/10/19 0248 10/10/19 1939  HGB 11.1*  --  10.7*  --   HCT 36.8  --  35.4*  --   PLT 282  --  263  --   APTT  --   --   --  105*  CREATININE 1.38*  --  1.31*  --   TROPONINIHS 13 15  --   --     Estimated Creatinine Clearance: 26.7 mL/min (A) (by C-G formula based on SCr of 1.31 mg/dL (H)).   Medical History: Past Medical History:  Diagnosis Date  . Anxiety   . Bradycardia   . CAD (coronary artery disease)    a. 08/2004 NSTEMI/PCI: LAD 32m/d (2.75 x 28 mm Taxus 2 DES);  b. 10/2010 Cath: LM nl, LAD patent stents w/ jailed septal, LCX 30, RCA 30; c. 06/2013 MV: No ischemia. Small fixed anteroseptal defect, ? scar vs atten. EF 62%; d. 03/2016 MV: EF 57%, Low risk.  . Chronic diastolic heart failure ([place])    a. Echo 7/13:  mod LVH, EF 65-70%, vigorous LV, Gr 1 diast dysfn, mild LAE; b. 05/2017 Echo: EF 60-65%, mod LVH, no rwma, Gr1 DD, mildly dil LA.  Marland Kitchen Chronic pain disorder   . CKD (chronic kidney disease), stage III   . DDD (degenerative disc disease), lumbosacral    , Spinal stenosis  . Diabetes mellitus ([place])    , Type II  . GERD (gastroesophageal reflux disease)   . HTN (hypertension)   . Hyperlipidemia   . Hypothyroidism   . IBS (irritable bowel syndrome)   . Lumbar disc disease   . Nonproliferative diabetic retinopathy associated with type 2 diabetes mellitus ([place])   . Obesity (BMI 30-39.9)   . Peripheral neuropathy    ,  Associated with DM  . Scoliosis      Assessment: 52 yoF admitted with CP. Pt on apixaban PTA for hx AFib, last dose prior to admit on 3/8 in am (none given this admit). Pharmacy asked to transition to IV heparin in anticipaiton of cardiac cath tomorrow.  PM f/u>  APTT just slightly above goal range.  No overt bleeding or complications noted.  Goal of Therapy:  Heparin level 0.3-0.7 units/ml aPTT 66-102 seconds Monitor platelets by anticoagulation protocol: Yes   Plan:  -Hold apixaban -Decrease IV heparin to 900 units/hr. -Check 8hr aPTT -Daily aPTT, heparin level, CBC   Marguerite Olea, [facility] Clinical Pharmacist Phone [phone]  10/10/2019 8:33 PM

## 2019-10-10 NOTE — Progress Notes (Addendum)
Pt arrived to the unit. Oriented to the unit. Pt. Refusing bed alarm and non skid socks.  Requesting something for her sore heels and BM. PA paged.

## 2019-10-10 NOTE — ED Notes (Signed)
Tele  Breakfast Ordered 

## 2019-10-11 DIAGNOSIS — I5032 Chronic diastolic (congestive) heart failure: Secondary | ICD-10-CM | POA: Diagnosis not present

## 2019-10-11 DIAGNOSIS — I2510 Atherosclerotic heart disease of native coronary artery without angina pectoris: Secondary | ICD-10-CM | POA: Diagnosis not present

## 2019-10-11 DIAGNOSIS — I200 Unstable angina: Secondary | ICD-10-CM | POA: Diagnosis not present

## 2019-10-11 DIAGNOSIS — E1169 Type 2 diabetes mellitus with other specified complication: Secondary | ICD-10-CM | POA: Diagnosis not present

## 2019-10-11 DIAGNOSIS — I10 Essential (primary) hypertension: Secondary | ICD-10-CM | POA: Diagnosis not present

## 2019-10-11 DIAGNOSIS — I25110 Atherosclerotic heart disease of native coronary artery with unstable angina pectoris: Secondary | ICD-10-CM | POA: Diagnosis not present

## 2019-10-11 LAB — CBC
HCT: 34.2 % — ABNORMAL LOW (ref 36.0–46.0)
Hemoglobin: 10.9 g/dL — ABNORMAL LOW (ref 12.0–15.0)
MCH: 29.1 pg (ref 26.0–34.0)
MCHC: 31.9 g/dL (ref 30.0–36.0)
MCV: 91.2 fL (ref 80.0–100.0)
Platelets: 274 10*3/uL (ref 150–400)
RBC: 3.75 MIL/uL — ABNORMAL LOW (ref 3.87–5.11)
RDW: 17.2 % — ABNORMAL HIGH (ref 11.5–15.5)
WBC: 8.1 10*3/uL (ref 4.0–10.5)
nRBC: 0 % (ref 0.0–0.2)

## 2019-10-11 LAB — BASIC METABOLIC PANEL
Anion gap: 12 (ref 5–15)
BUN: 28 mg/dL — ABNORMAL HIGH (ref 8–23)
CO2: 21 mmol/L — ABNORMAL LOW (ref 22–32)
Calcium: 11.5 mg/dL — ABNORMAL HIGH (ref 8.9–10.3)
Chloride: 104 mmol/L (ref 98–111)
Creatinine, Ser: 1.43 mg/dL — ABNORMAL HIGH (ref 0.44–1.00)
GFR calc Af Amer: 38 mL/min — ABNORMAL LOW (ref >60)
GFR calc non Af Amer: 33 mL/min — ABNORMAL LOW (ref >60)
Glucose, Bld: 126 mg/dL — ABNORMAL HIGH (ref 70–99)
Potassium: 4.1 mmol/L (ref 3.5–5.1)
Sodium: 137 mmol/L (ref 135–145)

## 2019-10-11 LAB — GLUCOSE, CAPILLARY: Glucose-Capillary: 114 mg/dL — ABNORMAL HIGH (ref 70–99)

## 2019-10-11 LAB — HEPARIN LEVEL (UNFRACTIONATED): Heparin Unfractionated: 1.04 IU/mL — ABNORMAL HIGH (ref 0.30–0.70)

## 2019-10-11 LAB — APTT: aPTT: 92 seconds — ABNORMAL HIGH (ref 24–36)

## 2019-10-11 MED ORDER — MENTHOL 3 MG MT LOZG
1.0000 | LOZENGE | OROMUCOSAL | Status: DC | PRN
  Administered 2019-10-11: 3 mg via ORAL
  Filled 2019-10-11: qty 9

## 2019-10-11 MED ORDER — MELATONIN 3 MG PO TABS
3.0000 mg | ORAL_TABLET | Freq: Every evening | ORAL | Status: DC | PRN
  Administered 2019-10-11: 3 mg via ORAL
  Filled 2019-10-11 (×2): qty 1

## 2019-10-11 NOTE — Progress Notes (Signed)
Patient continues to express frustration with plan of care and not understanding why she remains in the hospital despite conversations with both the attending and cardiac PA rounding this morning.  Patient expressing she would like to have a bath but refuses to allow [place] to help her to the sink to brush her teeth or wash up. Patient assisted to the bedside commode and emotional support offered. Will continue to anticipate patient needs and provide care as appropriate.

## 2019-10-11 NOTE — Discharge Instructions (Signed)
Angina  Angina is very bad discomfort or pain in the chest, neck, arm, jaw, or back. The discomfort is caused by a lack of blood in the middle layer of the heart wall (myocardium). What are the causes? This condition is caused by a buildup of fat and cholesterol (plaque) in your arteries (atherosclerosis). This buildup narrows the arteries and makes it hard for blood to flow. What increases the risk? You are more likely to develop this condition if:  You have high levels of cholesterol in your blood.  You have high blood pressure (hypertension).  You have diabetes.  You have a family history of heart disease.  You are not active, or you do not exercise enough.  You feel sad (depressed).  You have been treated with high energy rays (radiation) on the left side of your chest. Other risk factors are:  Using tobacco.  Being very overweight (obese).  Eating a diet high in unhealthy fats (saturated fats).  Having stress, or being exposed to things that cause stress.  Using drugs, such as cocaine. Women have a greater risk for angina if:  They are older than 55.  They have stopped having their period (are in postmenopause). What are the signs or symptoms? Common symptoms of this condition in both men and women may include:  Chest pain, which may: ? Feel like a crushing or squeezing in the chest. ? Feel like a tightness, pressure, fullness, or heaviness in the chest. ? Last for more than a few minutes at a time. ? Stop and come back (recur) after a few minutes.  Pain in the neck, arm, jaw, or back.  Heartburn or upset stomach (indigestion) for no reason.  Being short of breath.  Feeling sick to your stomach (nauseous).  Sudden cold sweats. Women and people with diabetes may have other symptoms that are not usual, such as feeling:  Tired (fatigue).  Worried or nervous (anxious) for no reason.  Weak for no reason.  Dizzy or passing out (fainting). How is this  treated? This condition may be treated with:  Medicines. These are given to: ? Prevent blood clots. ? Prevent heart attack. ? Relax blood vessels and improve blood flow to the heart (nitrates). ? Reduce blood pressure. ? Improve the pumping action of the heart. ? Reduce fat and cholesterol in the blood.  A procedure to widen a narrowed or blocked artery in the heart (angioplasty).  Surgery to allow blood to go around a blocked artery (coronary artery bypass surgery). Follow these instructions at home: Medicines  Take over-the-counter and prescription medicines only as told by your doctor.  Do not take these medicines unless your doctor says that you can: ? NSAIDs. These include:  Ibuprofen.  Naproxen. ? Vitamin supplements that have vitamin A, vitamin E, or both. ? Hormone therapy that contains estrogen with or without progestin. Eating and drinking   Eat a heart-healthy diet that includes: ? Lots of fresh fruits and vegetables. ? Whole grains. ? Low-fat (lean) protein. ? Low-fat dairy products.  Follow instructions from your doctor about what you cannot eat or drink. Activity  Follow an exercise program that your doctor tells you.  Talk with your doctor about joining a program to help improve the health of your heart (cardiac rehab).  When you feel tired, take a break. Plan breaks if you know you are going to feel tired. Lifestyle   Do not use any products that contain nicotine or tobacco. This includes cigarettes, e-cigarettes, and   chewing tobacco. If you need help quitting, ask your doctor.  If your doctor says you can drink alcohol: ? Limit how much you use to:  0-1 drink a day for women who are not pregnant.  0-2 drinks a day for men. ? Be aware of how much alcohol is in your drink. In the [place] one drink equals:  One 12 oz bottle of beer (355 mL).  One 5 oz glass of wine (148 mL).  One 1 oz glass of hard liquor (44 mL). General instructions  Stay  at a healthy weight. If your doctor tells you to do so, work with him or her to lose weight.  Learn to deal with stress. If you need help, ask your doctor.  Keep your vaccines up to date. Get a flu shot every year.  Talk with your doctor if you feel sad. Take a screening test to see if you are at risk for depression.  Work with your doctor to manage any other health problems that you have. These may include diabetes or high blood pressure.  Keep all follow-up visits as told by your doctor. This is important. Get help right away if:  You have pain in your chest, neck, arm, jaw, or back, and the pain: ? Lasts more than a few minutes. ? Comes back. ? Does not get better after you take medicine under your tongue (sublingual nitroglycerin). ? Keeps getting worse. ? Comes more often.  You have any of these problems for no reason: ? Sweating a lot. ? Heartburn or upset stomach. ? Shortness of breath. ? Trouble breathing. ? Feeling sick to your stomach. ? Throwing up (vomiting). ? Feeling more tired than normal. ? Feeling nervous or worrying more than normal. ? Weakness.  You are suddenly dizzy or light-headed.  You pass out. These symptoms may be an emergency. Do not wait to see if the symptoms will go away. Get medical help right away. Call your local emergency services (911 in the [place] Do not drive yourself to the hospital. Summary  Angina is very bad discomfort or pain in the chest, neck, arm, neck, or back.  Symptoms include chest pain, heartburn or upset stomach for no reason, and shortness of breath.  Women or people with diabetes may have symptoms that are not usual, such as feeling nervous or worried for no reason, weak for no reason, or tired.  Take all medicines only as told by your doctor.  You should eat a heart-healthy diet and follow an exercise program. This information is not intended to replace advice given to you by your health care provider. Make sure you  discuss any questions you have with your health care provider. Document Revised: 03/07/2018 Document Reviewed: 03/07/2018 Elsevier Patient Education  2020 [employer].  

## 2019-10-11 NOTE — Discharge Summary (Signed)
Physician Discharge Summary  Kristine Oliver VOZ:[id number] DOB: 1931-02-17 DOA: 10/09/2019  PCP: Lavone Orn, MD  Admit date: 10/09/2019 Discharge date: 10/11/2019  Admitted From: Home Disposition:  Home  Recommendations for Outpatient Follow-up:  1. Follow up with PCP as scheduled on 3/19 2. Follow up with Cardiology as scheduled on 3/25  Discharge Condition: Stable CODE STATUS: DNR  Diet recommendation: Heart healthy   Brief/Interim Summary: Kristine Phang Sloanis an 86 y.o.femalewith a past medical history that includes CAD with NSTEMI in 2005 status post DES to LAD and in 2019 status post DES tomCX,persistent A. fib on Eliquis, hypertension, diabetes, chronotropic incompetence(declined PPM),chronic kidney disease stage III, chronic heart failure presents to the emergency department chief complaint chest pain. She was evaluated by cardiology in the emergency department who opined negative troponins, inconsistent chest pain and no EKG changes recommended medical management.   Patient has not had any further chest pain.  Ambulated well without recurrence of symptoms.  Cleared for discharge home from cardiology standpoint.  Discharge Diagnoses:  Principal Problem:   Unstable angina (HCC) Active Problems:   CAD (coronary artery disease)   Hypothyroidism   Type 2 diabetes mellitus with vascular disease (HCC)   Hyperlipidemia associated with type 2 diabetes mellitus (HCC)   Chronic diastolic CHF (congestive heart failure) (HCC)   Essential hypertension   Persistent atrial fibrillation (HCC)   CKD (chronic kidney disease) stage 3, GFR 30-59 ml/min   Stable angina (HCC)   Chest pain with history of CAD with chronic angina, status post DES mid CX and mid LAD -Cardiology consulted  -IV heparin --> Eliquis on discharge  -Continue Imdur, Plavix  Persistent A. fib status post DCCV January 2020 -IV heparin --> Eliquis on discharge   Chronic diastolic heart failure -Recent echo  with an EF of 42%, grade 1 diastolic dysfunction -Status post IV Lasix  Sinus bradycardia -No beta-blocker per cardiology  Hypertension -Continue amlodipine, Imdur, ramipril   Chronic kidney disease stage IIIb-IV  -Baseline creatinine 1.6 back in August 2020 -Creatinine remains stable  Diabetes type 2, well controlled -Hemoglobin A1c 6.5 -Continue sliding scale insulin  Hypothyroidism -TSH normal, 1.193 -Continue Synthroid  Hyperlipidemia -Continue Crestor  Chronic pain -Continue home oxycodone    Discharge Instructions  Discharge Instructions    Call MD for:  difficulty breathing, headache or visual disturbances   Complete by: As directed    Call MD for:  extreme fatigue   Complete by: As directed    Call MD for:  persistant dizziness or light-headedness   Complete by: As directed    Call MD for:  persistant nausea and vomiting   Complete by: As directed    Call MD for:  severe uncontrolled pain   Complete by: As directed    Call MD for:  temperature >100.4   Complete by: As directed    Diet - low sodium heart healthy   Complete by: As directed    Discharge instructions   Complete by: As directed    You were cared for by a hospitalist during your hospital stay. If you have any questions about your discharge medications or the care you received while you were in the hospital after you are discharged, you can call the unit and ask to speak with the hospitalist on call if the hospitalist that took care of you is not available. Once you are discharged, your primary care physician will handle any further medical issues. Please note that NO REFILLS for any discharge medications will  be authorized once you are discharged, as it is imperative that you return to your primary care physician (or establish a relationship with a primary care physician if you do not have one) for your aftercare needs so that they can reassess your need for medications and monitor your lab  values.   Increase activity slowly   Complete by: As directed      Allergies as of 10/11/2019      Reactions   Pravastatin Other (See Comments)   Muscle aches   Prednisone Nausea And Vomiting      Medication List    TAKE these medications   albuterol 108 (90 Base) MCG/ACT inhaler Commonly known as: VENTOLIN HFA Inhale 1 puff into the lungs every 4 (four) hours as needed for wheezing or shortness of breath.   amLODipine 2.5 MG tablet Commonly known as: NORVASC Take 1 tablet (2.5 mg total) by mouth daily.   antiseptic oral rinse Liqd 15 mLs by Mouth Rinse route 2 (two) times daily.   apixaban 2.5 MG Tabs tablet Commonly known as: ELIQUIS Take 1 tablet (2.5 mg total) by mouth 2 (two) times daily.   ASPERCREME LIDOCAINE EX Apply 1 application topically 2 (two) times daily as needed (back/feet pain).   clindamycin 1 % lotion Commonly known as: CLEOCIN T Apply 1 application topically 2 (two) times daily. Facial wash   clopidogrel 75 MG tablet Commonly known as: PLAVIX Take 1 tablet (75 mg total) by mouth daily.   cycloSPORINE 0.05 % ophthalmic emulsion Commonly known as: RESTASIS Place 1 drop into both eyes 2 (two) times daily as needed (dry eyes).   docusate sodium 100 MG capsule Commonly known as: COLACE Take 100 mg by mouth daily.   furosemide 20 MG tablet Commonly known as: Lasix Take 1 tablet by mouth daily, may take a extra tablet daily only as needed if wt is up to 171 What changed:   how much to take  how to take this  when to take this  additional instructions  Another medication with the same name was removed. Continue taking this medication, and follow the directions you see here.   gabapentin 300 MG capsule Commonly known as: NEURONTIN Take 300 mg by mouth 2 (two) times daily.   isosorbide mononitrate 30 MG 24 hr tablet Commonly known as: IMDUR Take 1 tablet (30 mg total) by mouth daily.   levothyroxine 75 MCG tablet Commonly known as:  SYNTHROID Take 75 mcg by mouth daily.   Melatonin 3 MG Tabs Take 3 mg by mouth at bedtime as needed (sleep).   metFORMIN 500 MG tablet Commonly known as: GLUCOPHAGE Take 500 mg by mouth 2 (two) times daily with a meal.   nitroGLYCERIN 0.4 MG SL tablet Commonly known as: NITROSTAT DISSOLVE 1 TABLET UNDER THE TONGUE EVERY 5 MINUTES FOR 3 DOSES AS NEEDED FOR CHEST PAIN What changed: See the new instructions.   oxyCODONE 5 MG immediate release tablet Commonly known as: Oxy IR/ROXICODONE Take 5 mg by mouth at bedtime.   Oxycodone HCl 10 MG Tabs Take 1 tablet (10 mg total) by mouth 4 (four) times daily.   pantoprazole 40 MG tablet Commonly known as: PROTONIX TAKE 1 TABLET(40 MG) BY MOUTH DAILY What changed: See the new instructions.   ramipril 5 MG capsule Commonly known as: ALTACE TAKE 1 CAPSULE(5 MG) BY MOUTH DAILY What changed: See the new instructions.   Retaine CMC 0.5 % Soln Generic drug: Carboxymethylcellulose Sod PF Place 1 drop into both eyes  daily.   rosuvastatin 20 MG tablet Commonly known as: CRESTOR TAKE 1 TABLET(20 MG) BY MOUTH DAILY AT 6 PM What changed: See the new instructions.   sodium chloride 0.65 % Soln nasal spray Commonly known as: OCEAN Place 1 spray into both nostrils as needed for congestion (dryness).   valACYclovir 500 MG tablet Commonly known as: VALTREX Take 500 mg by mouth 2 (two) times daily.   vitamin B-12 500 MCG tablet Commonly known as: CYANOCOBALAMIN Take 500 mcg by mouth daily.      Follow-up Information    Lavone Orn, MD.   Specialty: Internal Medicine Why: GO: March 19 at 10:30am Contact information: [address] [place] [address] [phone]        Jerline Pain, MD Follow up on 10/26/2019.   Specialty: Cardiology Why: Hospital follow-up March 25th at 11:20 AM Contact information: [address] [place] [address] [phone]          Allergies  Allergen  Reactions  . Pravastatin Other (See Comments)    Muscle aches  . Prednisone Nausea And Vomiting    Consultations:  Cardiology   Procedures/Studies: DG Chest 2 View  Result Date: 10/09/2019 CLINICAL DATA:  Angina tack 2 days ago. Shortness of breath. Fatigue. Weakness. EXAM: CHEST - 2 VIEW COMPARISON:  10/29/2018.  04/22/2018. FINDINGS: Accentuation of expected thoracic kyphosis with lower thoracic spondylosis. Midline trachea. Normal heart size. Peripheral predominant pulmonary interstitial thickening. No lobar consolidation. IMPRESSION: Slowly progressive pulmonary interstitial thickening, peripheral predominant. Although this could be related to pulmonary venous congestion, interstitial lung disease cannot be excluded. If the patient's symptoms are chronic and not felt to be related to congestive failure, consider high-resolution chest CT. Electronically Signed   By: Abigail Miyamoto M.D.   On: 10/09/2019 17:05       Discharge Exam: Vitals:   10/11/19 0529 10/11/19 0909  BP: 127/66 104/73  Pulse: 79   Resp: 18   Temp: 98 F (36.7 C)   SpO2: 90%      General exam: Appears calm and comfortable  Respiratory system: Clear to auscultation. Respiratory effort normal. No respiratory distress. No conversational dyspnea.  Cardiovascular system: S1 & S2 heard. No murmurs. No pedal edema. Gastrointestinal system: Abdomen is nondistended, soft and nontender. Normal bowel sounds heard. Central nervous system: Alert and oriented. No focal neurological deficits. Speech clear.  Extremities: Symmetric in appearance  Skin: No rashes, lesions or ulcers on exposed skin  Psychiatry: Judgement and insight appear normal. Mood & affect appropriate.    The results of significant diagnostics from this hospitalization (including imaging, microbiology, ancillary and laboratory) are listed below for reference.     Microbiology: Recent Results (from the past 240 hour(s))  SARS CORONAVIRUS 2 (TAT 6-24  HRS) Nasopharyngeal Nasopharyngeal Swab     Status: None   Collection Time: 10/09/19 11:23 PM   Specimen: Nasopharyngeal Swab  Result Value Ref Range Status   SARS Coronavirus 2 NEGATIVE NEGATIVE Final    Comment: (NOTE) SARS-CoV-2 target nucleic acids are NOT DETECTED. The SARS-CoV-2 RNA is generally detectable in upper and lower respiratory specimens during the acute phase of infection. Negative results do not preclude SARS-CoV-2 infection, do not rule out co-infections with other pathogens, and should not be used as the sole basis for treatment or other patient management decisions. Negative results must be combined with clinical observations, patient history, and epidemiological information. The expected result is Negative. Fact Sheet for Patients: SugarRoll.be Fact Sheet  for Healthcare Providers: https://www.woods-mathews.com/ This test is not yet approved or cleared by the [place] and  has been authorized for detection and/or diagnosis of SARS-CoV-2 by FDA under an Emergency Use Authorization (EUA). This EUA will remain  in effect (meaning this test can be used) for the duration of the COVID-19 declaration under Section 56 4(b)(1) of the Act, 21 U.S.C. section 360bbb-3(b)(1), unless the authorization is terminated or revoked sooner. Performed at [facility], [place] [address], [place], [place] [address]      Labs: BNP (last 3 results) Recent Labs    03/10/19 1829 10/09/19 1636  BNP 376.4* 681.1*   Basic Metabolic Panel: Recent Labs  Lab 10/09/19 1636 10/10/19 0248 10/11/19 0432  NA 134* 139 137  K 4.9 4.8 4.1  CL 107 109 104  CO2 18* 24 21*  GLUCOSE 109* 134* 126*  BUN 22 19 28*  CREATININE 1.38* 1.31* 1.43*  CALCIUM 11.1* 11.5* 11.5*   Liver Function Tests: No results for input(s): AST, ALT, ALKPHOS, BILITOT, PROT, ALBUMIN in the last 168 hours. No results for input(s): LIPASE, AMYLASE in the last 168  hours. No results for input(s): AMMONIA in the last 168 hours. CBC: Recent Labs  Lab 10/09/19 1636 10/10/19 0248 10/11/19 0432  WBC 10.3 6.8 8.1  HGB 11.1* 10.7* 10.9*  HCT 36.8 35.4* 34.2*  MCV 96.3 95.7 91.2  PLT 282 263 274   Cardiac Enzymes: No results for input(s): CKTOTAL, CKMB, CKMBINDEX, TROPONINI in the last 168 hours. BNP: Invalid input(s): POCBNP CBG: Recent Labs  Lab 10/10/19 0734 10/10/19 1110 10/10/19 1605 10/10/19 2116 10/11/19 0625  GLUCAP 89 118* 132* 128* 114*   D-Dimer No results for input(s): DDIMER in the last 72 hours. Hgb A1c Recent Labs    10/09/19 1636  HGBA1C 6.5*   Lipid Profile No results for input(s): CHOL, HDL, LDLCALC, TRIG, CHOLHDL, LDLDIRECT in the last 72 hours. Thyroid function studies Recent Labs    10/10/19 1939  TSH 1.193   Anemia work up No results for input(s): VITAMINB12, FOLATE, FERRITIN, TIBC, IRON, RETICCTPCT in the last 72 hours. Urinalysis    Component Value Date/Time   COLORURINE YELLOW 03/10/2019 1749   APPEARANCEUR HAZY (A) 03/10/2019 1749   LABSPEC 1.008 03/10/2019 1749   PHURINE 6.0 03/10/2019 1749   GLUCOSEU NEGATIVE 03/10/2019 1749   HGBUR SMALL (A) 03/10/2019 1749   BILIRUBINUR NEGATIVE 03/10/2019 1749   KETONESUR NEGATIVE 03/10/2019 1749   PROTEINUR NEGATIVE 03/10/2019 1749   UROBILINOGEN 0.2 05/01/2012 1913   NITRITE NEGATIVE 03/10/2019 1749   LEUKOCYTESUR LARGE (A) 03/10/2019 1749   Sepsis Labs Invalid input(s): PROCALCITONIN,  WBC,  LACTICIDVEN Microbiology Recent Results (from the past 240 hour(s))  SARS CORONAVIRUS 2 (TAT 6-24 HRS) Nasopharyngeal Nasopharyngeal Swab     Status: None   Collection Time: 10/09/19 11:23 PM   Specimen: Nasopharyngeal Swab  Result Value Ref Range Status   SARS Coronavirus 2 NEGATIVE NEGATIVE Final    Comment: (NOTE) SARS-CoV-2 target nucleic acids are NOT DETECTED. The SARS-CoV-2 RNA is generally detectable in upper and lower respiratory specimens during  the acute phase of infection. Negative results do not preclude SARS-CoV-2 infection, do not rule out co-infections with other pathogens, and should not be used as the sole basis for treatment or other patient management decisions. Negative results must be combined with clinical observations, patient history, and epidemiological information. The expected result is Negative. Fact Sheet for Patients: SugarRoll.be Fact Sheet for Healthcare Providers: https://www.woods-mathews.com/ This test  is not yet approved or cleared by the [place] and  has been authorized for detection and/or diagnosis of SARS-CoV-2 by FDA under an Emergency Use Authorization (EUA). This EUA will remain  in effect (meaning this test can be used) for the duration of the COVID-19 declaration under Section 56 4(b)(1) of the Act, 21 U.S.C. section 360bbb-3(b)(1), unless the authorization is terminated or revoked sooner. Performed at [facility], [place] [address] [place], [place] [address]      Patient was seen and examined on the day of discharge and was found to be in stable condition. Time coordinating discharge: 35 minutes including assessment and coordination of care, as well as examination of the patient.   SIGNED:  Dessa Phi, DO [facility] 10/11/2019, 1:19 PM

## 2019-10-11 NOTE — Progress Notes (Signed)
To the best of my knowledge, the student's charting is accurate.  

## 2019-10-11 NOTE — Progress Notes (Signed)
SATURATION QUALIFICATIONS: (This note is used to comply with regulatory documentation for home oxygen)  Patient Saturations on Room Air at Rest = 96%  Patient Saturations on Room Air while Ambulating = 93%  Patient was able to ambulate ~114ft with a front wheel walker. Patient asked to take a rest as she felt winded. Oxygen saturation remained 91%. Continued to walk another another 41ft with a short pause and then returned to the room. Oxygen remains at 95%

## 2019-10-11 NOTE — Progress Notes (Addendum)
ANTICOAGULATION CONSULT NOTE - Follow Up Consult  Pharmacy Consult for heparin Indication: atrial fibrillation  Labs: Recent Labs    10/09/19 1636 10/09/19 1636 10/09/19 1923 10/10/19 0248 10/10/19 1939 10/11/19 0432  HGB 11.1*   < >  --  10.7*  --  10.9*  HCT 36.8  --   --  35.4*  --  34.2*  PLT 282  --   --  263  --  274  APTT  --   --   --   --  105* 92*  CREATININE 1.38*  --   --  1.31*  --  1.43*  TROPONINIHS 13  --  15  --   --   --    < > = values in this interval not displayed.    Assessment/Plan:  86yo female therapeutic on heparin after rate change. Will continue gtt at current rate.  Follow up plans for cath?    Thank you Anette Guarneri, PharmD

## 2019-10-11 NOTE — Progress Notes (Signed)
PROGRESS NOTE    Kristine Oliver  MWN:[id number] DOB: 1930-12-13 DOA: 10/09/2019 PCP: Lavone Orn, MD     Brief Narrative:  Kristine Oliver is an 86 y.o. female with a past medical history that includes CAD with NSTEMI in 2005 status post DES to LAD and in 2019 status post DES to [facility], persistent A. fib on Eliquis, hypertension, diabetes, chronotropic incompetence (declined PPM), chronic kidney disease stage III, chronic heart failure presents to the emergency department chief complaint chest pain.  She was evaluated by cardiology in the emergency department who opined negative troponins, inconsistent chest pain and no EKG changes recommended medical management.   New events last 24 hours / Subjective: Patient was frustrated this morning regarding her medical care.  On my examination, she denies any further chest pain or shortness of breath.  In better spirits this morning after our conversation.  Assessment & Plan:   Principal Problem:   Unstable angina (HCC) Active Problems:   CAD (coronary artery disease)   Hypothyroidism   Type 2 diabetes mellitus with vascular disease (HCC)   Hyperlipidemia associated with type 2 diabetes mellitus (HCC)   Chronic diastolic CHF (congestive heart failure) (HCC)   Essential hypertension   Persistent atrial fibrillation (HCC)   CKD (chronic kidney disease) stage 3, GFR 30-59 ml/min   Stable angina (HCC)   Chest pain with history of CAD with chronic angina, status post DES mid CX and mid LAD -Cardiology following -IV heparin -Cardiology determining for possible heart cath during this admission -Continue Imdur, Plavix  Persistent A. fib status post DCCV January 2020 -IV heparin as above.  On Eliquis at home  Chronic diastolic heart failure -Recent echo with an EF of 40%, grade 1 diastolic dysfunction -Status post IV Lasix  Sinus bradycardia -No beta-blocker per cardiology  Hypertension -Continue amlodipine, Imdur, ramipril   Chronic  kidney disease stage IIIb-IV  -Baseline creatinine 1.6 back in August 2020 -Creatinine remains stable  Diabetes type 2, well controlled -Hemoglobin A1c 6.5 -Continue sliding scale insulin  Hypothyroidism -TSH normal, 1.193 -Continue Synthroid  Hyperlipidemia -Continue Crestor  Chronic pain -Continue home oxycodone     DVT prophylaxis: IV heparin Code Status: DNR Family Communication: None at bedside Disposition Plan:  . Patient is from home prior to admission. . Currently in-hospital treatment needed due to evaluation for her chest pain, cardiology consulted and following for possible heart cath versus medical management. . Suspect patient will discharge to home in 1 to 2 days.   Consultants:   Cardiology  Procedures:   None  Antimicrobials:  Anti-infectives (From admission, onward)   None        Objective: Vitals:   10/10/19 2146 10/11/19 0012 10/11/19 0529 10/11/19 0909  BP: (!) 101/34  127/66 104/73  Pulse: 69  79   Resp: 18  18   Temp: 98.1 F (36.7 C)  98 F (36.7 C)   TempSrc: Oral     SpO2: 95%  90%   Weight:  71.6 kg    Height:        Intake/Output Summary (Last 24 hours) at 10/11/2019 1045 Last data filed at 10/11/2019 0531 Gross per 24 hour  Intake 183.84 ml  Output 900 ml  Net -716.16 ml   Filed Weights   10/09/19 1629 10/10/19 1102 10/11/19 0012  Weight: 74.8 kg 72.4 kg 71.6 kg    Examination:  General exam: Appears calm and comfortable  Respiratory system: Clear to auscultation. Respiratory effort normal. No respiratory distress. No  conversational dyspnea.  Cardiovascular system: S1 & S2 heard. No murmurs. No pedal edema. Gastrointestinal system: Abdomen is nondistended, soft and nontender. Normal bowel sounds heard. Central nervous system: Alert and oriented. No focal neurological deficits. Speech clear.  Extremities: Symmetric in appearance  Skin: No rashes, lesions or ulcers on exposed skin  Psychiatry: Judgement and  insight appear normal. Mood & affect appropriate.   Data Reviewed: I have personally reviewed following labs and imaging studies  CBC: Recent Labs  Lab 10/09/19 1636 10/10/19 0248 10/11/19 0432  WBC 10.3 6.8 8.1  HGB 11.1* 10.7* 10.9*  HCT 36.8 35.4* 34.2*  MCV 96.3 95.7 91.2  PLT 282 263 161   Basic Metabolic Panel: Recent Labs  Lab 10/09/19 1636 10/10/19 0248 10/11/19 0432  NA 134* 139 137  K 4.9 4.8 4.1  CL 107 109 104  CO2 18* 24 21*  GLUCOSE 109* 134* 126*  BUN 22 19 28*  CREATININE 1.38* 1.31* 1.43*  CALCIUM 11.1* 11.5* 11.5*   GFR: Estimated Creatinine Clearance: 24.3 mL/min (A) (by C-G formula based on SCr of 1.43 mg/dL (H)). Liver Function Tests: No results for input(s): AST, ALT, ALKPHOS, BILITOT, PROT, ALBUMIN in the last 168 hours. No results for input(s): LIPASE, AMYLASE in the last 168 hours. No results for input(s): AMMONIA in the last 168 hours. Coagulation Profile: No results for input(s): INR, PROTIME in the last 168 hours. Cardiac Enzymes: No results for input(s): CKTOTAL, CKMB, CKMBINDEX, TROPONINI in the last 168 hours. BNP (last 3 results) No results for input(s): PROBNP in the last 8760 hours. HbA1C: Recent Labs    10/09/19 1636  HGBA1C 6.5*   CBG: Recent Labs  Lab 10/10/19 0734 10/10/19 1110 10/10/19 1605 10/10/19 2116 10/11/19 0625  GLUCAP 89 118* 132* 128* 114*   Lipid Profile: No results for input(s): CHOL, HDL, LDLCALC, TRIG, CHOLHDL, LDLDIRECT in the last 72 hours. Thyroid Function Tests: Recent Labs    10/10/19 1939  TSH 1.193   Anemia Panel: No results for input(s): VITAMINB12, FOLATE, FERRITIN, TIBC, IRON, RETICCTPCT in the last 72 hours. Sepsis Labs: No results for input(s): PROCALCITON, LATICACIDVEN in the last 168 hours.  Recent Results (from the past 240 hour(s))  SARS CORONAVIRUS 2 (TAT 6-24 HRS) Nasopharyngeal Nasopharyngeal Swab     Status: None   Collection Time: 10/09/19 11:23 PM   Specimen:  Nasopharyngeal Swab  Result Value Ref Range Status   SARS Coronavirus 2 NEGATIVE NEGATIVE Final    Comment: (NOTE) SARS-CoV-2 target nucleic acids are NOT DETECTED. The SARS-CoV-2 RNA is generally detectable in upper and lower respiratory specimens during the acute phase of infection. Negative results do not preclude SARS-CoV-2 infection, do not rule out co-infections with other pathogens, and should not be used as the sole basis for treatment or other patient management decisions. Negative results must be combined with clinical observations, patient history, and epidemiological information. The expected result is Negative. Fact Sheet for Patients: SugarRoll.be Fact Sheet for Healthcare Providers: https://www.woods-mathews.com/ This test is not yet approved or cleared by the [place] FDA and  has been authorized for detection and/or diagnosis of SARS-CoV-2 by FDA under an Emergency Use Authorization (EUA). This EUA will remain  in effect (meaning this test can be used) for the duration of the COVID-19 declaration under Section 56 4(b)(1) of the Act, 21 U.S.C. section 360bbb-3(b)(1), unless the authorization is terminated or revoked sooner. Performed at [facility], [place] [address] [place], [place] [address]  Radiology Studies: DG Chest 2 View  Result Date: 10/09/2019 CLINICAL DATA:  Angina tack 2 days ago. Shortness of breath. Fatigue. Weakness. EXAM: CHEST - 2 VIEW COMPARISON:  10/29/2018.  04/22/2018. FINDINGS: Accentuation of expected thoracic kyphosis with lower thoracic spondylosis. Midline trachea. Normal heart size. Peripheral predominant pulmonary interstitial thickening. No lobar consolidation. IMPRESSION: Slowly progressive pulmonary interstitial thickening, peripheral predominant. Although this could be related to pulmonary venous congestion, interstitial lung disease cannot be excluded. If the patient's symptoms are  chronic and not felt to be related to congestive failure, consider high-resolution chest CT. Electronically Signed   By: Abigail Miyamoto M.D.   On: 10/09/2019 17:05      Scheduled Meds: . amLODipine  2.5 mg Oral Daily  . clopidogrel  75 mg Oral Daily  . docusate sodium  100 mg Oral Daily  . insulin aspart  0-9 Units Subcutaneous TID WC  . isosorbide mononitrate  30 mg Oral Daily  . levothyroxine  75 mcg Oral Q0600  . lidocaine  1 patch Transdermal Q24H  . pantoprazole  40 mg Oral Daily  . polyethylene glycol  17 g Oral Daily  . ramipril  5 mg Oral Daily  . rosuvastatin  20 mg Oral QPM  . sodium chloride flush  3 mL Intravenous Once  . sodium chloride flush  3 mL Intravenous Q12H   Continuous Infusions: . heparin 900 Units/hr (10/11/19 0915)     LOS: 1 day      Time spent: 35 minutes   Dessa Phi, DO [facility] 10/11/2019, 10:45 AM   Available via Epic secure chat 7am-7pm After these hours, please refer to coverage provider listed on [email or web address]

## 2019-10-11 NOTE — Progress Notes (Addendum)
Progress Note  Patient Name: Kristine Oliver Date of Encounter: 10/11/2019  Primary Cardiologist: [name], MD   Subjective   Patient has not had further chest pain but has not been up and about. Says she feels 'dry' this morning and generally tired. Patient is in rate controlled Afib.  Inpatient Medications    Scheduled Meds: . amLODipine  2.5 mg Oral Daily  . clopidogrel  75 mg Oral Daily  . docusate sodium  100 mg Oral Daily  . insulin aspart  0-9 Units Subcutaneous TID WC  . isosorbide mononitrate  30 mg Oral Daily  . levothyroxine  75 mcg Oral Q0600  . lidocaine  1 patch Transdermal Q24H  . pantoprazole  40 mg Oral Daily  . polyethylene glycol  17 g Oral Daily  . ramipril  5 mg Oral Daily  . rosuvastatin  20 mg Oral QPM  . sodium chloride flush  3 mL Intravenous Once  . sodium chloride flush  3 mL Intravenous Q12H   Continuous Infusions: . heparin 900 Units/hr (10/11/19 0915)   PRN Meds: acetaminophen **OR** acetaminophen, albuterol, hydroxypropyl methylcellulose / hypromellose, Melatonin, menthol-cetylpyridinium, nitroGLYCERIN, ondansetron **OR** ondansetron (ZOFRAN) IV, oxyCODONE, oxyCODONE   Vital Signs    Vitals:   10/10/19 2146 10/11/19 0012 10/11/19 0529 10/11/19 0909  BP: (!) 101/34  127/66 104/73  Pulse: 69  79   Resp: 18  18   Temp: 98.1 F (36.7 C)  98 F (36.7 C)   TempSrc: Oral     SpO2: 95%  90%   Weight:  71.6 kg    Height:        Intake/Output Summary (Last 24 hours) at 10/11/2019 1003 Last data filed at 10/11/2019 0531 Gross per 24 hour  Intake 183.84 ml  Output 900 ml  Net -716.16 ml   Last 3 Weights 10/11/2019 10/10/2019 10/09/2019  Weight (lbs) 157 lb 12.8 oz 159 lb 9.8 oz 165 lb  Weight (kg) 71.578 kg 72.4 kg 74.844 kg      Telemetry    Afib rates slowly improved overnight. Currently in the 60-70s - Personally Reviewed  ECG    No new - Personally Reviewed  Physical Exam   GEN: No acute distress.   Neck: No JVD Cardiac:  Irreg Irreg, no murmurs, rubs, or gallops.  Respiratory: crackles B/L GI: Soft, nontender, non-distended  MS: No edema; No deformity. Neuro:  Nonfocal  Psych: Normal affect   Labs    High Sensitivity Troponin:   Recent Labs  Lab 10/09/19 1636 10/09/19 1923  TROPONINIHS 13 15      Chemistry Recent Labs  Lab 10/09/19 1636 10/10/19 0248 10/11/19 0432  NA 134* 139 137  K 4.9 4.8 4.1  CL 107 109 104  CO2 18* 24 21*  GLUCOSE 109* 134* 126*  BUN 22 19 28*  CREATININE 1.38* 1.31* 1.43*  CALCIUM 11.1* 11.5* 11.5*  GFRNONAA 35* 37* 33*  GFRAA 40* 43* 38*  ANIONGAP 9 6 12      Hematology Recent Labs  Lab 10/09/19 1636 10/10/19 0248 10/11/19 0432  WBC 10.3 6.8 8.1  RBC 3.82* 3.70* 3.75*  HGB 11.1* 10.7* 10.9*  HCT 36.8 35.4* 34.2*  MCV 96.3 95.7 91.2  MCH 29.1 28.9 29.1  MCHC 30.2 30.2 31.9  RDW 17.3* 17.3* 17.2*  PLT 282 263 274    BNP Recent Labs  Lab 10/09/19 1636  BNP 247.9*     DDimer No results for input(s): DDIMER in the last 168 hours.  Radiology    DG Chest 2 View  Result Date: 10/09/2019 CLINICAL DATA:  Angina tack 2 days ago. Shortness of breath. Fatigue. Weakness. EXAM: CHEST - 2 VIEW COMPARISON:  10/29/2018.  04/22/2018. FINDINGS: Accentuation of expected thoracic kyphosis with lower thoracic spondylosis. Midline trachea. Normal heart size. Peripheral predominant pulmonary interstitial thickening. No lobar consolidation. IMPRESSION: Slowly progressive pulmonary interstitial thickening, peripheral predominant. Although this could be related to pulmonary venous congestion, interstitial lung disease cannot be excluded. If the patient's symptoms are chronic and not felt to be related to congestive failure, consider high-resolution chest CT. Electronically Signed   By: Abigail Miyamoto M.D.   On: 10/09/2019 17:05    Cardiac Studies   Cardiac cath 03/2018  Prox RCA to Mid RCA lesion is 25% stenosed.  Mid Cx lesion is 90% stenosed.  A drug-eluting  stent was successfully placed using a STENT SIERRA 2.75 X 28 MM.  Post intervention, there is a 0% residual stenosis.  Ost Cx to Prox Cx lesion is 50% stenosed.  Previously placed Mid LAD stent (unknown type) is widely patent.  Prox LAD lesion is 25% stenosed.  LV end diastolic pressure is mildly elevated. LVEDP 17 mm Hg.  There is no aortic valve stenosis.  Vasospasm and discomfort in the right arm from the radial approach.  Recommend uninterrupted dual antiplatelet therapy with Aspirin 81mg  daily and Clopidogrel 75mg  daily for a minimum of 12 months (ACS - Class I recommendation).  Patient was already on Plavix. COuld consdier Brilinta instead if bleeding risk is low.   Continue aggressive secondary prevention.   Echo 03/2018 Study Conclusions   - Left ventricle: The cavity size was normal. There was mild  concentric hypertrophy. Systolic function was normal. The  estimated ejection fraction was in the range of 60% to 65%. Wall  motion was normal; there were no regional wall motion  abnormalities. Doppler parameters are consistent with abnormal  left ventricular relaxation (grade 1 diastolic dysfunction).  Doppler parameters are consistent with elevated ventricular  end-diastolic filling pressure.  - Aortic valve: There was trivial regurgitation.  - Mitral valve: There was mild regurgitation.  - Left atrium: The atrium was mildly dilated.  - Right ventricle: Systolic function was normal.  - Tricuspid valve: There was mild regurgitation.  - Pulmonic valve: There was no regurgitation.  - Pulmonary arteries: Systolic pressure was within the normal  range.  - Inferior vena cava: The vessel was normal in size.  - Pericardium, extracardiac: There was no pericardial effusion.  Patient Profile     86 y.o. female with a history of CAD with NSTEMI in 2005 s/p DES to LAD and in 2019 s/p DES to mCx, persistent AF on Eliquis, HTN, DM2, CHF with preserved EF,  chronotropic incompetence (declined PPM), CKD III, and chronic pain who is being seen for chest pain.   Assessment & Plan      CAD with chronic angina/  S/p DES midCx and mid LAD - Patient has had intermittent history of chest pain despite revascularization that is improved with nitrates - Patient presented with her known angina and fatigue. EKG with rate controlled Afib. HS troponin 13 > 15. - Cath 03/2018 showed prox to mid RCA lesion 25% stenosed, ost to prox Cx lesion 50%, prox LAD 25% stenosed - Eliquis held and started on IV heparin for possible procedure. - Patient takes plavix 75 mg daily and Imdur 30 mg daily - Patient has some soft pressures otherwise could consider increasing Imdur. Can consider  stopping another antihypertensive to further titrate imdur. - NO BB 2/2 bradycardia. - Patient has not had recurrent CP but has not been up and about. Nurse will walk with patient this morning. Will discuss cath with MD  Persistent Afib s/p DCCV in 08/2018 - Patient has been rate controlled in afib.  - Eliquis 2.5mg  BID for stroke prophylaxis>>now covered with IV heparin - Afib might be contributing to fatigue - Can also consider Amiodarone for rhythm control. Has not been started in the past for possible side effects and bradycardia  Chronic diastolic CHF - EF 33-83%, G1DD 03/2018 - CXR with pulmonary venous congestion and BNP 247 - She takes lasix 20 mg daily at baseline - Given IV lasix 20 mg x 2  Sinus bradycardia - Has seen EP for PPM consult and did not plan to pursue PPM  HTN - amlodipine 2.5mg , Imdur 30 mg, ramipril 5 mg, lasix 20 mg daily - pressures stable  CKD stage III - stable - creatinine 1.38>1.31>1.43  Hypothyroidism - synthroid  Hyperlipidemia - Rosuvastatin - LDL 47 in 10/2018  Chronic pain - on Oxycodon and OxyIR  For questions or updates, please contact [facility] Please consult [email or web address] for contact info under     Signed, Cadence  Ninfa Meeker, PA-C  10/11/2019, 10:03 AM    The patient was seen, examined and discussed with Cadence Ninfa Meeker, PA-C   and I agree with the above.   86 y.o. female with a history of CAD with NSTEMI in 2005 s/p DES to LAD and in 2019 s/p DES to mCx, persistent AF on Eliquis, HTN, DM2, CHF with preserved EF, chronotropic incompetence (declined PPM), CKD III, and chronic pain who is being seen for chest pain.  Her pain seems to be typical, it occurs on exertion and resolves with nitroglycerin.  Her EKG is unchanged showing atrial fibrillation, rate controlled, nonspecific ST-T wave abnormalities, unchanged from prior.  Her troponin is minimally elevated at 13 and 15 with flat trend.  Creatinine on admission was improved from baseline currently 1.3, her prior one was 1.6, however today 1.4, GFR 33. Given her advanced age and CKD stage III she is not an ideal candidate for cath, we added Imdurand she had no recurrent chest pains.  Her last cath one was in 2019 that showed 90% stenosis in mid circumflex that was treated with drug-eluting stent.  She also has ostial 50% stenosis in the circumflex, previously placed stent in mid LAD was patent.  RCA only had minimal stenosis.  We held Eliquis  for a possible cath.  Plan would be to walk her, and she is asymptomatic discharge her home, will arrange for follow-up, if she is being discharged please restart Eliquis.  Ena Dawley, MD 10/11/2019

## 2019-10-11 NOTE — Progress Notes (Signed)
Pt very frustrated about bed alarm being on and pads on the floor. Educated on purpose for these safety measures, bed alarm turned off but left pads at the bedside.

## 2019-10-26 ENCOUNTER — Ambulatory Visit: Payer: Medicare PPO | Admitting: Cardiology

## 2019-11-04 ENCOUNTER — Encounter (HOSPITAL_COMMUNITY): Payer: Self-pay | Admitting: *Deleted

## 2019-11-04 ENCOUNTER — Inpatient Hospital Stay (HOSPITAL_COMMUNITY)
Admission: EM | Admit: 2019-11-04 | Discharge: 2019-11-10 | DRG: 291 | Disposition: A | Discharge status: Skilled Nursing Facility | Payer: Medicare PPO | Attending: Internal Medicine | Admitting: Internal Medicine

## 2019-11-04 ENCOUNTER — Emergency Department (HOSPITAL_COMMUNITY): Payer: Medicare PPO

## 2019-11-04 ENCOUNTER — Other Ambulatory Visit: Payer: Self-pay

## 2019-11-04 DIAGNOSIS — Z20822 Contact with and (suspected) exposure to covid-19: Secondary | ICD-10-CM | POA: Diagnosis present

## 2019-11-04 DIAGNOSIS — I252 Old myocardial infarction: Secondary | ICD-10-CM

## 2019-11-04 DIAGNOSIS — I4819 Other persistent atrial fibrillation: Secondary | ICD-10-CM | POA: Diagnosis present

## 2019-11-04 DIAGNOSIS — E785 Hyperlipidemia, unspecified: Secondary | ICD-10-CM | POA: Diagnosis present

## 2019-11-04 DIAGNOSIS — E113299 Type 2 diabetes mellitus with mild nonproliferative diabetic retinopathy without macular edema, unspecified eye: Secondary | ICD-10-CM | POA: Diagnosis present

## 2019-11-04 DIAGNOSIS — M5137 Other intervertebral disc degeneration, lumbosacral region: Secondary | ICD-10-CM | POA: Diagnosis present

## 2019-11-04 DIAGNOSIS — F4321 Adjustment disorder with depressed mood: Secondary | ICD-10-CM

## 2019-11-04 DIAGNOSIS — F319 Bipolar disorder, unspecified: Secondary | ICD-10-CM | POA: Diagnosis present

## 2019-11-04 DIAGNOSIS — E669 Obesity, unspecified: Secondary | ICD-10-CM | POA: Diagnosis present

## 2019-11-04 DIAGNOSIS — F604 Histrionic personality disorder: Secondary | ICD-10-CM | POA: Diagnosis present

## 2019-11-04 DIAGNOSIS — Z833 Family history of diabetes mellitus: Secondary | ICD-10-CM

## 2019-11-04 DIAGNOSIS — I130 Hypertensive heart and chronic kidney disease with heart failure and stage 1 through stage 4 chronic kidney disease, or unspecified chronic kidney disease: Principal | ICD-10-CM | POA: Diagnosis present

## 2019-11-04 DIAGNOSIS — K589 Irritable bowel syndrome without diarrhea: Secondary | ICD-10-CM | POA: Diagnosis present

## 2019-11-04 DIAGNOSIS — Z87891 Personal history of nicotine dependence: Secondary | ICD-10-CM

## 2019-11-04 DIAGNOSIS — E1142 Type 2 diabetes mellitus with diabetic polyneuropathy: Secondary | ICD-10-CM | POA: Diagnosis present

## 2019-11-04 DIAGNOSIS — J9600 Acute respiratory failure, unspecified whether with hypoxia or hypercapnia: Secondary | ICD-10-CM | POA: Diagnosis present

## 2019-11-04 DIAGNOSIS — R0902 Hypoxemia: Secondary | ICD-10-CM

## 2019-11-04 DIAGNOSIS — K219 Gastro-esophageal reflux disease without esophagitis: Secondary | ICD-10-CM | POA: Diagnosis present

## 2019-11-04 DIAGNOSIS — R0602 Shortness of breath: Secondary | ICD-10-CM | POA: Diagnosis not present

## 2019-11-04 DIAGNOSIS — J189 Pneumonia, unspecified organism: Secondary | ICD-10-CM | POA: Diagnosis present

## 2019-11-04 DIAGNOSIS — Z8249 Family history of ischemic heart disease and other diseases of the circulatory system: Secondary | ICD-10-CM

## 2019-11-04 DIAGNOSIS — N184 Chronic kidney disease, stage 4 (severe): Secondary | ICD-10-CM | POA: Diagnosis present

## 2019-11-04 DIAGNOSIS — G8929 Other chronic pain: Secondary | ICD-10-CM | POA: Diagnosis present

## 2019-11-04 DIAGNOSIS — M4800 Spinal stenosis, site unspecified: Secondary | ICD-10-CM | POA: Diagnosis present

## 2019-11-04 DIAGNOSIS — E1122 Type 2 diabetes mellitus with diabetic chronic kidney disease: Secondary | ICD-10-CM | POA: Diagnosis present

## 2019-11-04 DIAGNOSIS — R0600 Dyspnea, unspecified: Secondary | ICD-10-CM

## 2019-11-04 DIAGNOSIS — R55 Syncope and collapse: Secondary | ICD-10-CM

## 2019-11-04 DIAGNOSIS — Z955 Presence of coronary angioplasty implant and graft: Secondary | ICD-10-CM

## 2019-11-04 DIAGNOSIS — Z7902 Long term (current) use of antithrombotics/antiplatelets: Secondary | ICD-10-CM

## 2019-11-04 DIAGNOSIS — I2510 Atherosclerotic heart disease of native coronary artery without angina pectoris: Secondary | ICD-10-CM | POA: Diagnosis present

## 2019-11-04 DIAGNOSIS — Z888 Allergy status to other drugs, medicaments and biological substances status: Secondary | ICD-10-CM

## 2019-11-04 DIAGNOSIS — Z6831 Body mass index (BMI) 31.0-31.9, adult: Secondary | ICD-10-CM

## 2019-11-04 DIAGNOSIS — R531 Weakness: Secondary | ICD-10-CM

## 2019-11-04 DIAGNOSIS — Z7901 Long term (current) use of anticoagulants: Secondary | ICD-10-CM

## 2019-11-04 DIAGNOSIS — Z515 Encounter for palliative care: Secondary | ICD-10-CM

## 2019-11-04 DIAGNOSIS — E039 Hypothyroidism, unspecified: Secondary | ICD-10-CM | POA: Diagnosis present

## 2019-11-04 DIAGNOSIS — M419 Scoliosis, unspecified: Secondary | ICD-10-CM | POA: Diagnosis present

## 2019-11-04 DIAGNOSIS — I10 Essential (primary) hypertension: Secondary | ICD-10-CM | POA: Diagnosis present

## 2019-11-04 DIAGNOSIS — Z66 Do not resuscitate: Secondary | ICD-10-CM | POA: Diagnosis present

## 2019-11-04 DIAGNOSIS — Z79890 Hormone replacement therapy (postmenopausal): Secondary | ICD-10-CM

## 2019-11-04 DIAGNOSIS — J9601 Acute respiratory failure with hypoxia: Secondary | ICD-10-CM | POA: Diagnosis present

## 2019-11-04 DIAGNOSIS — I5033 Acute on chronic diastolic (congestive) heart failure: Secondary | ICD-10-CM

## 2019-11-04 LAB — BASIC METABOLIC PANEL
Anion gap: 6 (ref 5–15)
BUN: 18 mg/dL (ref 8–23)
CO2: 22 mmol/L (ref 22–32)
Calcium: 11 mg/dL — ABNORMAL HIGH (ref 8.9–10.3)
Chloride: 105 mmol/L (ref 98–111)
Creatinine, Ser: 1.18 mg/dL — ABNORMAL HIGH (ref 0.44–1.00)
GFR calc Af Amer: 48 mL/min — ABNORMAL LOW (ref >60)
GFR calc non Af Amer: 42 mL/min — ABNORMAL LOW (ref >60)
Glucose, Bld: 100 mg/dL — ABNORMAL HIGH (ref 70–99)
Potassium: 4.6 mmol/L (ref 3.5–5.1)
Sodium: 133 mmol/L — ABNORMAL LOW (ref 135–145)

## 2019-11-04 LAB — CBC WITH DIFFERENTIAL/PLATELET
Abs Immature Granulocytes: 0.09 10*3/uL — ABNORMAL HIGH (ref 0.00–0.07)
Basophils Absolute: 0.1 10*3/uL (ref 0.0–0.1)
Basophils Relative: 0 %
Eosinophils Absolute: 1.1 10*3/uL — ABNORMAL HIGH (ref 0.0–0.5)
Eosinophils Relative: 9 %
HCT: 33.7 % — ABNORMAL LOW (ref 36.0–46.0)
Hemoglobin: 10.3 g/dL — ABNORMAL LOW (ref 12.0–15.0)
Immature Granulocytes: 1 %
Lymphocytes Relative: 17 %
Lymphs Abs: 2 10*3/uL (ref 0.7–4.0)
MCH: 28.4 pg (ref 26.0–34.0)
MCHC: 30.6 g/dL (ref 30.0–36.0)
MCV: 92.8 fL (ref 80.0–100.0)
Monocytes Absolute: 0.9 10*3/uL (ref 0.1–1.0)
Monocytes Relative: 8 %
Neutro Abs: 7.6 10*3/uL (ref 1.7–7.7)
Neutrophils Relative %: 65 %
Platelets: 407 10*3/uL — ABNORMAL HIGH (ref 150–400)
RBC: 3.63 MIL/uL — ABNORMAL LOW (ref 3.87–5.11)
RDW: 18 % — ABNORMAL HIGH (ref 11.5–15.5)
WBC: 11.7 10*3/uL — ABNORMAL HIGH (ref 4.0–10.5)
nRBC: 0 % (ref 0.0–0.2)

## 2019-11-04 LAB — TROPONIN I (HIGH SENSITIVITY): Troponin I (High Sensitivity): 11 ng/L (ref <18)

## 2019-11-04 MED ORDER — SODIUM CHLORIDE 0.9 % IV SOLN
1.0000 g | Freq: Once | INTRAVENOUS | Status: AC
  Administered 2019-11-05: 1 g via INTRAVENOUS
  Filled 2019-11-04: qty 10

## 2019-11-04 MED ORDER — DOXYCYCLINE HYCLATE 100 MG PO TABS
100.0000 mg | ORAL_TABLET | Freq: Once | ORAL | Status: AC
  Administered 2019-11-05: 100 mg via ORAL
  Filled 2019-11-04: qty 1

## 2019-11-04 NOTE — ED Triage Notes (Signed)
Pt from home for sob x several weeks, worsening in the past couple of days. EMS reports oxygen sats in mid and upper 80s while ambulating in her house. 2L oxygen given for comfort, sats increased to 99%. Pt denies pain

## 2019-11-04 NOTE — ED Provider Notes (Signed)
[facility] EMERGENCY DEPARTMENT Provider Note   CSN: [id number] Arrival date & time: 11/04/19  2127     History Chief Complaint  Patient presents with  . Shortness of Breath    Kristine Oliver is a 86 y.o. female.  The history is provided by the patient and medical records. No language interpreter was used.   Kristine Oliver is a 86 y.o. female who presents to the Emergency Department complaining of sob.  She presents to the ED complaining of three weeks of progressive sob.  Sob is constant, worse with activity.  It has gradually progressed over the last few weeks and she describes it as feeling like she needs oxygen.  She has a cough occasionally productive of yellow sputum.  Denies chest pain, fever, diaphoresis, n/v/d, abdominal pain, leg swelling/pain.  Sxs are moderate, constant, owrsening.  Per EMS she had sats in the 80s at home.      Past Medical History:  Diagnosis Date  . Anxiety   . Bradycardia   . CAD (coronary artery disease)    a. 08/2004 NSTEMI/PCI: LAD 2m/d (2.75 x 28 mm Taxus 2 DES);  b. 10/2010 Cath: LM nl, LAD patent stents w/ jailed septal, LCX 30, RCA 30; c. 06/2013 MV: No ischemia. Small fixed anteroseptal defect, ? scar vs atten. EF 62%; d. 03/2016 MV: EF 57%, Low risk.  . Chronic diastolic heart failure ([place])    a. Echo 7/13:  mod LVH, EF 65-70%, vigorous LV, Gr 1 diast dysfn, mild LAE; b. 05/2017 Echo: EF 60-65%, mod LVH, no rwma, Gr1 DD, mildly dil LA.  Marland Kitchen Chronic pain disorder   . CKD (chronic kidney disease), stage III   . DDD (degenerative disc disease), lumbosacral    , Spinal stenosis  . Diabetes mellitus ([place])    , Type II  . GERD (gastroesophageal reflux disease)   . HTN (hypertension)   . Hyperlipidemia   . Hypothyroidism   . IBS (irritable bowel syndrome)   . Lumbar disc disease   . Nonproliferative diabetic retinopathy associated with type 2 diabetes mellitus ([place])   . Obesity (BMI 30-39.9)   . Peripheral neuropathy    ,  Associated with DM  . Scoliosis     Patient Active Problem List   Diagnosis Date Noted  . Stable angina ([place]) 10/10/2019  . CKD (chronic kidney disease) stage 3, GFR 30-59 ml/min 10/09/2019  . AKI (acute kidney injury) ([place])   . Unstable angina ([place]) 10/29/2018  . Persistent atrial fibrillation ([place])   . Status post coronary artery stent placement   . NSTEMI (non-ST elevated myocardial infarction) ([place]) 03/18/2018  . Essential hypertension 05/19/2016  . Chest pain 05/18/2016  . Bradycardia 09/26/2014  . Sinus bradycardia 10/30/2013  . Fatigue 10/30/2013  . Chronic diastolic CHF (congestive heart failure) ([place]) 06/26/2013  . CAD (coronary artery disease) 02/12/2012  . Hypercalcemia 02/12/2012  . Chronic pain disorder   . Hypothyroidism   . Type 2 diabetes mellitus with vascular disease ([place])   . Hyperlipidemia associated with type 2 diabetes mellitus ([place])   . Lumbar disc disease   . Obesity (BMI 30-39.9)   . Hypertensive heart disease without CHF   . Bipolar affective disorder ([place])   . Scoliosis     Past Surgical History:  Procedure Laterality Date  . BACK SURGERY    . CARDIAC CATHETERIZATION    . CARDIOVERSION N/A 08/25/2018   Procedure: CARDIOVERSION;  Surgeon: Jerline Pain, MD;  Location: [facility]  ENDOSCOPY;  Service: Cardiovascular;  Laterality: N/A;  . CHOLECYSTECTOMY    . CORONARY STENT INTERVENTION N/A 03/21/2018   Procedure: CORONARY STENT INTERVENTION;  Surgeon: Jettie Booze, MD;  Location: [place] CV LAB;  Service: Cardiovascular;  Laterality: N/A;  . DILATION AND CURETTAGE OF UTERUS    . HEMILAMINOTOMY LUMBAR SPINE  2002   Rt L5, with decompression and foraminotomy  . INDUCED ABORTION     , x2  . LAMINOTOMY Right   . LEFT HEART CATH AND CORONARY ANGIOGRAPHY N/A 03/21/2018   Procedure: LEFT HEART CATH AND CORONARY ANGIOGRAPHY;  Surgeon: Jettie Booze, MD;  Location: [place] [facility];  Service: Cardiovascular;  Laterality: N/A;  . LUMBAR [place]  SURGERY  07/1999   L5-S1  . PILONIDAL CYST EXCISION    . ROTATOR CUFF REPAIR    . TONSILLECTOMY    . TOTAL ABDOMINAL HYSTERECTOMY W/ BILATERAL SALPINGOOPHORECTOMY       OB History   No obstetric history on file.     Family History  Problem Relation Age of Onset  . Heart failure Father   . Diabetes Father   . Coronary artery disease Mother     Social History   Tobacco Use  . Smoking status: Former Smoker    Years: 35.00  . Smokeless tobacco: Never Used  Substance Use Topics  . Alcohol use: No    Comment: social drinking, not often  . Drug use: No    Home Medications Prior to Admission medications   Medication Sig Start Date End Date Taking? Authorizing Provider  albuterol (VENTOLIN HFA) 108 (90 Base) MCG/ACT inhaler Inhale 1 puff into the lungs every 4 (four) hours as needed for wheezing or shortness of breath.  09/25/19   [provider]  amLODipine (NORVASC) 2.5 MG tablet Take 1 tablet (2.5 mg total) by mouth daily. 11/01/18   Jerline Pain, MD  antiseptic oral rinse (BIOTENE) LIQD 15 mLs by Mouth Rinse route 2 (two) times daily.     [provider]  apixaban (ELIQUIS) 2.5 MG TABS tablet Take 1 tablet (2.5 mg total) by mouth 2 (two) times daily. 10/31/18   Duke, Tami Lin, PA  ASPERCREME LIDOCAINE EX Apply 1 application topically 2 (two) times daily as needed (back/feet pain).    [provider]  Carboxymethylcellulose Sod PF (RETAINE CMC) 0.5 % SOLN Place 1 drop into both eyes daily.     [provider]  clindamycin (CLEOCIN T) 1 % lotion Apply 1 application topically 2 (two) times daily. Facial wash 11/16/13   [provider]  clopidogrel (PLAVIX) 75 MG tablet Take 1 tablet (75 mg total) by mouth daily. 03/23/18   Kroeger, Lorelee Cover., PA-C  cycloSPORINE (RESTASIS) 0.05 % ophthalmic emulsion Place 1 drop into both eyes 2 (two) times daily as needed (dry eyes).     [provider]  docusate sodium (COLACE) 100 MG  capsule Take 100 mg by mouth daily.    [provider]  furosemide (LASIX) 20 MG tablet Take 1 tablet by mouth daily, may take a extra tablet daily only as needed if wt is up to 171 Patient taking differently: Take 20 mg by mouth daily. may take a extra tablet daily only as needed if wt is up to 171 12/01/18   Isaiah Serge, NP  gabapentin (NEURONTIN) 300 MG capsule Take 300 mg by mouth 2 (two) times daily.     [provider]  isosorbide mononitrate (IMDUR) 30 MG 24  hr tablet Take 1 tablet (30 mg total) by mouth daily. 07/06/19 10/08/28  Jerline Pain, MD  levothyroxine (SYNTHROID, LEVOTHROID) 75 MCG tablet Take 75 mcg by mouth daily.    [provider]  Melatonin 3 MG TABS Take 3 mg by mouth at bedtime as needed (sleep).    [provider]  metFORMIN (GLUCOPHAGE) 500 MG tablet Take 500 mg by mouth 2 (two) times daily with a meal.  03/17/16   [provider]  nitroGLYCERIN (NITROSTAT) 0.4 MG SL tablet DISSOLVE 1 TABLET UNDER THE TONGUE EVERY 5 MINUTES FOR 3 DOSES AS NEEDED FOR CHEST PAIN Patient taking differently: Place 0.4 mg under the tongue every 5 (five) minutes x 3 doses as needed for chest pain.  01/23/19   Cheryln Manly, NP  oxyCODONE (OXY IR/ROXICODONE) 5 MG immediate release tablet Take 5 mg by mouth at bedtime. 10/19/18   [provider]  Oxycodone HCl 10 MG TABS Take 1 tablet (10 mg total) by mouth 4 (four) times daily. 03/10/19   Drenda Freeze, MD  pantoprazole (PROTONIX) 40 MG tablet TAKE 1 TABLET(40 MG) BY MOUTH DAILY Patient taking differently: Take 40 mg by mouth daily.  03/27/19   Jerline Pain, MD  ramipril (ALTACE) 5 MG capsule TAKE 1 CAPSULE(5 MG) BY MOUTH DAILY Patient taking differently: Take 5 mg by mouth daily.  07/19/18   Kroeger, Lorelee Cover., PA-C  rosuvastatin (CRESTOR) 20 MG tablet TAKE 1 TABLET(20 MG) BY MOUTH DAILY AT 6 PM Patient taking differently: Take 20 mg by mouth every evening.  05/26/19   Kroeger, Lorelee Cover., PA-C  sodium chloride (OCEAN) 0.65 % SOLN nasal spray Place 1 spray into both nostrils as needed for congestion (dryness).    [provider]  valACYclovir (VALTREX) 500 MG tablet Take 500 mg by mouth 2 (two) times daily.     [provider]  vitamin B-12 (CYANOCOBALAMIN) 500 MCG tablet Take 500 mcg by mouth daily.    [provider]    Allergies    Pravastatin and Prednisone  Review of Systems   Review of Systems  All other systems reviewed and are negative.   Physical Exam Updated Vital Signs BP 126/65   Pulse 74   Resp 18   Ht 4\' 11"  (1.499 m)   Wt 70.8 kg   SpO2 100%   BMI 31.51 kg/m   Physical Exam Vitals and nursing note reviewed.  Constitutional:      Appearance: She is well-developed.  HENT:     Head: Normocephalic and atraumatic.  Cardiovascular:     Rate and Rhythm: Normal rate and regular rhythm.     Heart sounds: No murmur.  Pulmonary:     Effort: Pulmonary effort is normal. No respiratory distress.     Comments: Tachypnea, fine crackles in bilateral bases Abdominal:     Palpations: Abdomen is soft.     Tenderness: There is no abdominal tenderness. There is no guarding or rebound.  Musculoskeletal:        General: No swelling or tenderness.  Skin:    General: Skin is warm and dry.  Neurological:     Mental Status: She is alert and oriented to person, place, and time.  Psychiatric:        Behavior: Behavior normal.     ED Results / Procedures / Treatments   Labs (all labs ordered are listed, but only abnormal results are displayed) Labs Reviewed  BASIC METABOLIC PANEL - Abnormal; Notable  for the following components:      Result Value   Sodium 133 (*)    Glucose, Bld 100 (*)    Creatinine, Ser 1.18 (*)    Calcium 11.0 (*)    GFR calc non Af Amer 42 (*)    GFR calc Af Amer 48 (*)    All other components within normal limits  BRAIN NATRIURETIC PEPTIDE - Abnormal; Notable for the following components:   B  Natriuretic Peptide 320.9 (*)    All other components within normal limits  CBC WITH DIFFERENTIAL/PLATELET - Abnormal; Notable for the following components:   WBC 11.7 (*)    RBC 3.63 (*)    Hemoglobin 10.3 (*)    HCT 33.7 (*)    RDW 18.0 (*)    Platelets 407 (*)    Eosinophils Absolute 1.1 (*)    Abs Immature Granulocytes 0.09 (*)    All other components within normal limits  SARS CORONAVIRUS 2 (TAT 6-24 HRS)  TROPONIN I (HIGH SENSITIVITY)    EKG EKG Interpretation  Date/Time:  Saturday November 04 2019 22:10:50 EDT Ventricular Rate:  77 PR Interval:    QRS Duration: 84 QT Interval:  340 QTC Calculation: 385 R Axis:   21 Text Interpretation: Atrial fibrillation Low voltage, precordial leads Confirmed by Quintella Reichert [phone]) on 11/04/2019 10:15:01 PM   Radiology DG Chest Port 1 View  Result Date: 11/04/2019 CLINICAL DATA:  Shortness of breath worsening in past couple days, decreased oxygen saturation in the mid to upper 80s EXAM: PORTABLE CHEST 1 VIEW COMPARISON:  Portable exam 2203 hours compared to 10/09/2019 FINDINGS: Normal heart size post coronary stenting. Stable mediastinal contours. Diffuse interstitial infiltrates throughout both lungs, similar to prior exam, at question pulmonary edema or atypical infection, chronic interstitial lung disease not excluded. No pleural effusion or pneumothorax. Scattered endplate spur formation thoracic spine. IMPRESSION: Diffuse interstitial infiltrates unchanged, question atypical infection, edema or chronic interstitial lung disease. Electronically Signed   By: Lavonia Dana M.D.   On: 11/04/2019 22:10    Procedures Procedures (including critical care time)  Medications Ordered in ED Medications  cefTRIAXone (ROCEPHIN) 1 g in sodium chloride 0.9 % 100 mL IVPB (has no administration in time range)  doxycycline (VIBRA-TABS) tablet 100 mg (has no administration in time range)    ED Course  I have reviewed the triage vital signs and the  nursing notes.  Pertinent labs & imaging results that were available during my care of the patient were reviewed by me and considered in my medical decision making (see chart for details).    MDM Rules/Calculators/A&P                     Pt with hx/o CHF, CKD here with progressive sob.  She has a new oxygen requirement on exam with tachypnea but no respiratory distress.  CXR with infiltrates, personally reviewed - similar compared to prior, unclear source.  Treating empirically for possible pneumonia.  She desats to 85 with minimal movement in her room.  Plan to admit for further treatment.  Pt is in agreement with treatment plan.    Final Clinical Impression(s) / ED Diagnoses Final diagnoses:  None    Rx / DC Orders ED Discharge Orders    None       Quintella Reichert, MD 11/05/19 [phone]

## 2019-11-05 ENCOUNTER — Inpatient Hospital Stay (HOSPITAL_COMMUNITY): Payer: Medicare PPO

## 2019-11-05 ENCOUNTER — Encounter (HOSPITAL_COMMUNITY): Payer: Self-pay | Admitting: Internal Medicine

## 2019-11-05 DIAGNOSIS — Z955 Presence of coronary angioplasty implant and graft: Secondary | ICD-10-CM | POA: Diagnosis not present

## 2019-11-05 DIAGNOSIS — Z833 Family history of diabetes mellitus: Secondary | ICD-10-CM | POA: Diagnosis not present

## 2019-11-05 DIAGNOSIS — M419 Scoliosis, unspecified: Secondary | ICD-10-CM | POA: Diagnosis present

## 2019-11-05 DIAGNOSIS — E785 Hyperlipidemia, unspecified: Secondary | ICD-10-CM | POA: Diagnosis present

## 2019-11-05 DIAGNOSIS — I130 Hypertensive heart and chronic kidney disease with heart failure and stage 1 through stage 4 chronic kidney disease, or unspecified chronic kidney disease: Secondary | ICD-10-CM | POA: Diagnosis present

## 2019-11-05 DIAGNOSIS — J189 Pneumonia, unspecified organism: Secondary | ICD-10-CM | POA: Diagnosis present

## 2019-11-05 DIAGNOSIS — M5137 Other intervertebral disc degeneration, lumbosacral region: Secondary | ICD-10-CM | POA: Diagnosis present

## 2019-11-05 DIAGNOSIS — Z515 Encounter for palliative care: Secondary | ICD-10-CM | POA: Diagnosis not present

## 2019-11-05 DIAGNOSIS — I10 Essential (primary) hypertension: Secondary | ICD-10-CM | POA: Diagnosis not present

## 2019-11-05 DIAGNOSIS — F4321 Adjustment disorder with depressed mood: Secondary | ICD-10-CM

## 2019-11-05 DIAGNOSIS — I5031 Acute diastolic (congestive) heart failure: Secondary | ICD-10-CM | POA: Diagnosis not present

## 2019-11-05 DIAGNOSIS — E113299 Type 2 diabetes mellitus with mild nonproliferative diabetic retinopathy without macular edema, unspecified eye: Secondary | ICD-10-CM | POA: Diagnosis present

## 2019-11-05 DIAGNOSIS — E1122 Type 2 diabetes mellitus with diabetic chronic kidney disease: Secondary | ICD-10-CM | POA: Diagnosis present

## 2019-11-05 DIAGNOSIS — J9601 Acute respiratory failure with hypoxia: Secondary | ICD-10-CM

## 2019-11-05 DIAGNOSIS — R45851 Suicidal ideations: Secondary | ICD-10-CM | POA: Diagnosis not present

## 2019-11-05 DIAGNOSIS — N184 Chronic kidney disease, stage 4 (severe): Secondary | ICD-10-CM | POA: Diagnosis present

## 2019-11-05 DIAGNOSIS — Z20822 Contact with and (suspected) exposure to covid-19: Secondary | ICD-10-CM | POA: Diagnosis present

## 2019-11-05 DIAGNOSIS — F319 Bipolar disorder, unspecified: Secondary | ICD-10-CM | POA: Diagnosis present

## 2019-11-05 DIAGNOSIS — J9600 Acute respiratory failure, unspecified whether with hypoxia or hypercapnia: Secondary | ICD-10-CM | POA: Diagnosis present

## 2019-11-05 DIAGNOSIS — E1142 Type 2 diabetes mellitus with diabetic polyneuropathy: Secondary | ICD-10-CM | POA: Diagnosis present

## 2019-11-05 DIAGNOSIS — R531 Weakness: Secondary | ICD-10-CM | POA: Diagnosis not present

## 2019-11-05 DIAGNOSIS — I5033 Acute on chronic diastolic (congestive) heart failure: Secondary | ICD-10-CM

## 2019-11-05 DIAGNOSIS — I252 Old myocardial infarction: Secondary | ICD-10-CM | POA: Diagnosis not present

## 2019-11-05 DIAGNOSIS — G8929 Other chronic pain: Secondary | ICD-10-CM | POA: Diagnosis present

## 2019-11-05 DIAGNOSIS — I2510 Atherosclerotic heart disease of native coronary artery without angina pectoris: Secondary | ICD-10-CM | POA: Diagnosis present

## 2019-11-05 DIAGNOSIS — Z7901 Long term (current) use of anticoagulants: Secondary | ICD-10-CM | POA: Diagnosis not present

## 2019-11-05 DIAGNOSIS — K219 Gastro-esophageal reflux disease without esophagitis: Secondary | ICD-10-CM | POA: Diagnosis present

## 2019-11-05 DIAGNOSIS — R55 Syncope and collapse: Secondary | ICD-10-CM

## 2019-11-05 DIAGNOSIS — Z66 Do not resuscitate: Secondary | ICD-10-CM | POA: Diagnosis present

## 2019-11-05 DIAGNOSIS — R0902 Hypoxemia: Secondary | ICD-10-CM | POA: Diagnosis not present

## 2019-11-05 DIAGNOSIS — E039 Hypothyroidism, unspecified: Secondary | ICD-10-CM | POA: Diagnosis present

## 2019-11-05 DIAGNOSIS — R0602 Shortness of breath: Secondary | ICD-10-CM | POA: Diagnosis present

## 2019-11-05 DIAGNOSIS — I4819 Other persistent atrial fibrillation: Secondary | ICD-10-CM | POA: Diagnosis present

## 2019-11-05 DIAGNOSIS — Z8249 Family history of ischemic heart disease and other diseases of the circulatory system: Secondary | ICD-10-CM | POA: Diagnosis not present

## 2019-11-05 LAB — GLUCOSE, CAPILLARY
Glucose-Capillary: 75 mg/dL (ref 70–99)
Glucose-Capillary: 131 mg/dL — ABNORMAL HIGH (ref 70–99)
Glucose-Capillary: 108 mg/dL — ABNORMAL HIGH (ref 70–99)

## 2019-11-05 LAB — CBC
HCT: 33.2 % — ABNORMAL LOW (ref 36.0–46.0)
Hemoglobin: 10.4 g/dL — ABNORMAL LOW (ref 12.0–15.0)
MCH: 29.3 pg (ref 26.0–34.0)
MCHC: 31.3 g/dL (ref 30.0–36.0)
MCV: 93.5 fL (ref 80.0–100.0)
Platelets: 373 10*3/uL (ref 150–400)
RBC: 3.55 MIL/uL — ABNORMAL LOW (ref 3.87–5.11)
RDW: 17.9 % — ABNORMAL HIGH (ref 11.5–15.5)
WBC: 8.9 10*3/uL (ref 4.0–10.5)
nRBC: 0 % (ref 0.0–0.2)

## 2019-11-05 LAB — CBG MONITORING, ED: Glucose-Capillary: 89 mg/dL (ref 70–99)

## 2019-11-05 LAB — ECHOCARDIOGRAM COMPLETE
Height: 59 in
Weight: 2511.48 oz

## 2019-11-05 LAB — SARS CORONAVIRUS 2 (TAT 6-24 HRS): SARS Coronavirus 2: NEGATIVE

## 2019-11-05 LAB — PROCALCITONIN: Procalcitonin: <0.1 ng/mL

## 2019-11-05 LAB — D-DIMER, QUANTITATIVE: D-Dimer, Quant: 2.11 ug/mL-FEU — ABNORMAL HIGH (ref 0.00–0.50)

## 2019-11-05 LAB — BRAIN NATRIURETIC PEPTIDE: B Natriuretic Peptide: 320.9 pg/mL — ABNORMAL HIGH (ref 0.0–100.0)

## 2019-11-05 MED ORDER — SODIUM CHLORIDE 0.9% FLUSH
3.0000 mL | Freq: Two times a day (BID) | INTRAVENOUS | Status: DC
  Administered 2019-11-05 – 2019-11-09 (×11): 3 mL via INTRAVENOUS

## 2019-11-05 MED ORDER — LEVOTHYROXINE SODIUM 75 MCG PO TABS
75.0000 ug | ORAL_TABLET | Freq: Every day | ORAL | Status: DC
  Administered 2019-11-06 – 2019-11-10 (×5): 75 ug via ORAL
  Filled 2019-11-05 (×5): qty 1

## 2019-11-05 MED ORDER — CLOPIDOGREL BISULFATE 75 MG PO TABS
75.0000 mg | ORAL_TABLET | Freq: Every day | ORAL | Status: DC
  Administered 2019-11-06 – 2019-11-10 (×5): 75 mg via ORAL
  Filled 2019-11-05 (×5): qty 1

## 2019-11-05 MED ORDER — APIXABAN 2.5 MG PO TABS
2.5000 mg | ORAL_TABLET | Freq: Two times a day (BID) | ORAL | Status: DC
  Administered 2019-11-05 – 2019-11-08 (×6): 2.5 mg via ORAL
  Filled 2019-11-05 (×6): qty 1

## 2019-11-05 MED ORDER — SODIUM CHLORIDE 0.9% FLUSH: 3.0000 mL | INTRAVENOUS | Status: DC | PRN

## 2019-11-05 MED ORDER — PRENATAL MULTIVITAMIN CH
1.0000 | ORAL_TABLET | Freq: Every day | ORAL | Status: DC
  Administered 2019-11-08: 1 via ORAL
  Filled 2019-11-05 (×3): qty 1

## 2019-11-05 MED ORDER — AMLODIPINE BESYLATE 5 MG PO TABS
2.5000 mg | ORAL_TABLET | Freq: Every day | ORAL | Status: DC
  Administered 2019-11-06: 2.5 mg via ORAL
  Filled 2019-11-05: qty 1

## 2019-11-05 MED ORDER — DOXYCYCLINE HYCLATE 100 MG PO TABS
100.0000 mg | ORAL_TABLET | Freq: Two times a day (BID) | ORAL | Status: AC
  Administered 2019-11-05 – 2019-11-09 (×10): 100 mg via ORAL
  Filled 2019-11-05 (×10): qty 1

## 2019-11-05 MED ORDER — FUROSEMIDE 10 MG/ML IJ SOLN
40.0000 mg | Freq: Every day | INTRAMUSCULAR | Status: DC
  Filled 2019-11-05 (×2): qty 4

## 2019-11-05 MED ORDER — ACETAMINOPHEN 325 MG PO TABS
650.0000 mg | ORAL_TABLET | ORAL | Status: DC | PRN
  Administered 2019-11-06 – 2019-11-08 (×2): 650 mg via ORAL
  Filled 2019-11-05 (×2): qty 2

## 2019-11-05 MED ORDER — PANTOPRAZOLE SODIUM 40 MG PO TBEC
40.0000 mg | DELAYED_RELEASE_TABLET | Freq: Every day | ORAL | Status: DC
  Administered 2019-11-06 – 2019-11-10 (×5): 40 mg via ORAL
  Filled 2019-11-05: qty 2
  Filled 2019-11-05: qty 1
  Filled 2019-11-05: qty 2
  Filled 2019-11-05 (×3): qty 1

## 2019-11-05 MED ORDER — VALACYCLOVIR HCL 500 MG PO TABS
500.0000 mg | ORAL_TABLET | Freq: Two times a day (BID) | ORAL | Status: DC
  Administered 2019-11-05 – 2019-11-10 (×10): 500 mg via ORAL
  Filled 2019-11-05 (×10): qty 1

## 2019-11-05 MED ORDER — SODIUM CHLORIDE 0.9 % IV SOLN: 250.0000 mL | INTRAVENOUS | Status: DC | PRN

## 2019-11-05 MED ORDER — FUROSEMIDE 10 MG/ML IJ SOLN
40.0000 mg | Freq: Once | INTRAMUSCULAR | Status: AC
  Administered 2019-11-05: 40 mg via INTRAVENOUS
  Filled 2019-11-05: qty 4

## 2019-11-05 MED ORDER — INSULIN ASPART 100 UNIT/ML ~~LOC~~ SOLN
0.0000 [IU] | Freq: Three times a day (TID) | SUBCUTANEOUS | Status: DC
  Administered 2019-11-08: 1 [IU] via SUBCUTANEOUS

## 2019-11-05 MED ORDER — OXYCODONE HCL 5 MG PO TABS
10.0000 mg | ORAL_TABLET | Freq: Four times a day (QID) | ORAL | Status: DC | PRN
  Administered 2019-11-05 – 2019-11-10 (×12): 10 mg via ORAL
  Filled 2019-11-05 (×12): qty 2

## 2019-11-05 MED ORDER — CYCLOSPORINE 0.05 % OP EMUL
1.0000 [drp] | Freq: Two times a day (BID) | OPHTHALMIC | Status: DC | PRN
  Filled 2019-11-05: qty 1

## 2019-11-05 MED ORDER — GABAPENTIN 300 MG PO CAPS
300.0000 mg | ORAL_CAPSULE | Freq: Two times a day (BID) | ORAL | Status: DC
  Administered 2019-11-05 – 2019-11-10 (×10): 300 mg via ORAL
  Filled 2019-11-05 (×8): qty 1
  Filled 2019-11-05: qty 3
  Filled 2019-11-05 (×2): qty 1

## 2019-11-05 MED ORDER — OXYCODONE HCL 5 MG PO TABS
10.0000 mg | ORAL_TABLET | Freq: Four times a day (QID) | ORAL | Status: DC
  Administered 2019-11-05 (×3): 10 mg via ORAL
  Filled 2019-11-05 (×3): qty 2

## 2019-11-05 MED ORDER — ENSURE ENLIVE PO LIQD
237.0000 mL | Freq: Two times a day (BID) | ORAL | Status: DC
  Administered 2019-11-05 – 2019-11-09 (×9): 237 mL via ORAL

## 2019-11-05 MED ORDER — INSULIN ASPART 100 UNIT/ML ~~LOC~~ SOLN: 0.0000 [IU] | Freq: Every day | SUBCUTANEOUS | Status: DC

## 2019-11-05 MED ORDER — IOHEXOL 350 MG/ML SOLN
100.0000 mL | Freq: Once | INTRAVENOUS | Status: AC | PRN
  Administered 2019-11-05: 100 mL via INTRAVENOUS

## 2019-11-05 MED ORDER — SODIUM CHLORIDE 0.9 % IV SOLN
1.0000 g | INTRAVENOUS | Status: AC
  Administered 2019-11-05 – 2019-11-09 (×5): 1 g via INTRAVENOUS
  Filled 2019-11-05 (×5): qty 1

## 2019-11-05 MED ORDER — DOCUSATE SODIUM 100 MG PO CAPS
100.0000 mg | ORAL_CAPSULE | Freq: Every day | ORAL | Status: DC
  Administered 2019-11-06 – 2019-11-10 (×5): 100 mg via ORAL
  Filled 2019-11-05 (×5): qty 1

## 2019-11-05 NOTE — Progress Notes (Signed)
PROGRESS NOTE  Kristine Oliver:[id number] DOB: August 29, 1930 DOA: 11/04/2019 PCP: Lavone Orn, MD  HPI/Recap of past 24 hours: HPI from Dr Damita Dunnings Kristine Oliver is a 86 y.o. female with medical history significant of CAD status post PCI, chronic diastolic congestive heart failure, persistent A. fib on Eliquis, CKD stage III-IV, type 2 diabetes, hypertension, hyperlipidemia, hypothyroidism, IBS, obesity (BMI 31.51) presenting to the ED via EMS for evaluation of shortness of breath.  EMS reported oxygen saturation in the mid to upper 80s while patient ambulated in her house.  Sats improved to 99% with 2 L supplemental oxygen.  Patient reports 1 week history of dyspnea even at rest.  Denies chest pain.  States she has postnasal drip and has been coughing a lot.  Reports eating a lot of potato chips and dip a week ago.  States she normally takes Lasix as needed but has not felt that she needed it this past week.  States she has passed out twice over this past week.  One time she woke up next to the commode and another time she passed out in her chair.  Reports bruising of her right forearm but denies any pain or injuries from the falls. Pt has already received both of her Covid vaccines, second dose was several weeks ago. In the ED, WBC count mildly elevated at 11.7.  Hemoglobin 10.3, no significant change from baseline.  Creatinine 1.1, improved compared to prior labs.  BNP 320.  High-sensitivity troponin negative.  EKG without acute ischemic changes. Chest x-ray showing diffuse interstitial infiltrates unchanged, concerning for atypical infection versus edema versus chronic interstitial lung disease. Patient admitted for further management.     Today, patient denies any new complaints, does not want any more Lasix because of frequent urination.  Denies any chest pain, worsening shortness of breath, abdominal pain, nausea/vomiting, fever/chills.  Shortly after my visit, nursing staff was alerted by  family members that patient had some oxycodone pills that she was about to take so she could die.  Patient stated that she wants to be comfort care/hospice as her quality of life at home is very poor and wants to have a peaceful and dignified death without pain.  Psychiatry and palliative care have been consulted.  Has been placed on one-to-one sitter    Assessment/Plan: Principal Problem:   Acute respiratory failure ([place]) Active Problems:   CAD (coronary artery disease)   Essential hypertension   Acute on chronic diastolic CHF (congestive heart failure) (HCC)   Syncope   Acute hypoxic respiratory failure likely 2/2 acute on chronic diastolic HF/CAP Currently afebrile, with no leukocytosis Currently on 2 L via nasal cannula saturating above 95% BNP 320, troponin negative COVID-19 virus negative, procalcitonin negative Chest x-ray showed diffuse interstitial infiltrates concerning for atypical infection, edema, chronic interstitial lung disease D-dimer mildly elevated, CTA chest showed no PE, diffuse bilateral groundglass airspace opacity suspicious for developing pulmonary edema versus multifocal pneumonia Echo with EF of 5 to 70%, no regional wall motion abnormality, unable to evaluate diastolic function due to persistent A. fib Continue doxycycline, azithromycin Continue diuresis with IV Lasix Strict I's and O's, daily weights  Recurrent falls/?syncope Head CT unremarkable PT/OT  CAD status post stents Currently chest pain-free Continue Plavix  Diabetes mellitus type 2 Last A1c 6.5 SSI, Accu-Cheks, hypoglycemic protocol  Persistent A. Fib Status post cardioversion in 08/2018 Currently in A. fib, rate controlled Continue Eliquis  Hypertension BP stable without any medication Restart home amlodipine, continue to hold  imdur, ramipril,  CKD stage III Creatinine currently at baseline Daily BMP  Hypothyroidism Continue Synthroid  Obesity Lifestyle modification  advised  Possible depression/goals of care discussion Nursing staff was alerted by family members that patient had some oxycodone pills that she was about to take so she could die.  Patient stated that she wants to be comfort care/hospice as her quality of life at home is very poor and wants to have a peaceful and dignified death without pain.   Psychiatry and palliative care have been consulted Pt has been placed on one-to-one sitter         Malnutrition Type:      Malnutrition Characteristics:      Nutrition Interventions:       Estimated body mass index is 31.7 kg/m as calculated from the following:   Height as of this encounter: 4\' 11"  (1.499 m).   Weight as of this encounter: 71.2 kg.     Code Status: DNR  Family Communication: Discussed extensively with patient  Disposition Plan: Patient from home, still needs diuresis and IV antibiotics.  Pending PT/OT, psychiatry, palliative evaluation.  Anticipate discharge in the next 24 to 48 hours   Consultants:  None  Procedures:  None  Antimicrobials:  Ceftriaxone  Doxycycline  DVT prophylaxis: Eliquis   Objective: Vitals:   11/05/19 0247 11/05/19 0254 11/05/19 0339 11/05/19 0741  BP:  134/62  123/60  Pulse:  83  80  Resp:  18  17  Temp:  97.7 F (36.5 C)  97.6 F (36.4 C)  TempSrc:  Oral  Oral  SpO2:  97% 98% 96%  Weight: 71.2 kg     Height: 4\' 11"  (1.499 m)       Intake/Output Summary (Last 24 hours) at 11/05/2019 1255 Last data filed at 11/05/2019 0700 Gross per 24 hour  Intake 0 ml  Output 2300 ml  Net -2300 ml   Filed Weights   11/04/19 2136 11/05/19 0247  Weight: 70.8 kg 71.2 kg    Exam:  General: NAD  Cardiovascular: S1, S2 present  Respiratory:  Bilateral crackles noted  Abdomen: Soft, nontender, nondistended, bowel sounds present  Musculoskeletal: No bilateral pedal edema noted  Skin: Normal  Psychiatry:  Poor mood   Data Reviewed: CBC: Recent Labs  Lab  11/04/19 2146 11/05/19 0112  WBC 11.7* 8.9  NEUTROABS 7.6  --   HGB 10.3* 10.4*  HCT 33.7* 33.2*  MCV 92.8 93.5  PLT 407* 546   Basic Metabolic Panel: Recent Labs  Lab 11/04/19 2146  NA 133*  K 4.6  CL 105  CO2 22  GLUCOSE 100*  BUN 18  CREATININE 1.18*  CALCIUM 11.0*   GFR: Estimated Creatinine Clearance: 29.4 mL/min (A) (by C-G formula based on SCr of 1.18 mg/dL (H)). Liver Function Tests: No results for input(s): AST, ALT, ALKPHOS, BILITOT, PROT, ALBUMIN in the last 168 hours. No results for input(s): LIPASE, AMYLASE in the last 168 hours. No results for input(s): AMMONIA in the last 168 hours. Coagulation Profile: No results for input(s): INR, PROTIME in the last 168 hours. Cardiac Enzymes: No results for input(s): CKTOTAL, CKMB, CKMBINDEX, TROPONINI in the last 168 hours. BNP (last 3 results) No results for input(s): PROBNP in the last 8760 hours. HbA1C: No results for input(s): HGBA1C in the last 72 hours. CBG: Recent Labs  Lab 11/05/19 0101 11/05/19 0747 11/05/19 1235  GLUCAP 89 75 131*   Lipid Profile: No results for input(s): CHOL, HDL, LDLCALC, TRIG, CHOLHDL, LDLDIRECT in  the last 72 hours. Thyroid Function Tests: No results for input(s): TSH, T4TOTAL, FREET4, T3FREE, THYROIDAB in the last 72 hours. Anemia Panel: No results for input(s): VITAMINB12, FOLATE, FERRITIN, TIBC, IRON, RETICCTPCT in the last 72 hours. Urine analysis:    Component Value Date/Time   COLORURINE YELLOW 03/10/2019 1749   APPEARANCEUR HAZY (A) 03/10/2019 1749   LABSPEC 1.008 03/10/2019 1749   PHURINE 6.0 03/10/2019 1749   GLUCOSEU NEGATIVE 03/10/2019 1749   HGBUR SMALL (A) 03/10/2019 1749   BILIRUBINUR NEGATIVE 03/10/2019 1749   KETONESUR NEGATIVE 03/10/2019 1749   PROTEINUR NEGATIVE 03/10/2019 1749   UROBILINOGEN 0.2 05/01/2012 1913   NITRITE NEGATIVE 03/10/2019 1749   LEUKOCYTESUR LARGE (A) 03/10/2019 1749   Sepsis  Labs: @LABRCNTIP (procalcitonin:4,lacticidven:4)  ) Recent Results (from the past 240 hour(s))  SARS CORONAVIRUS 2 (TAT 6-24 HRS) Nasopharyngeal Nasopharyngeal Swab     Status: None   Collection Time: 11/05/19 12:01 AM   Specimen: Nasopharyngeal Swab  Result Value Ref Range Status   SARS Coronavirus 2 NEGATIVE NEGATIVE Final    Comment: (NOTE) SARS-CoV-2 target nucleic acids are NOT DETECTED. The SARS-CoV-2 RNA is generally detectable in upper and lower respiratory specimens during the acute phase of infection. Negative results do not preclude SARS-CoV-2 infection, do not rule out co-infections with other pathogens, and should not be used as the sole basis for treatment or other patient management decisions. Negative results must be combined with clinical observations, patient history, and epidemiological information. The expected result is Negative. Fact Sheet for Patients: SugarRoll.be Fact Sheet for Healthcare Providers: https://www.woods-mathews.com/ This test is not yet approved or cleared by the [place] FDA and  has been authorized for detection and/or diagnosis of SARS-CoV-2 by FDA under an Emergency Use Authorization (EUA). This EUA will remain  in effect (meaning this test can be used) for the duration of the COVID-19 declaration under Section 56 4(b)(1) of the Act, 21 U.S.C. section 360bbb-3(b)(1), unless the authorization is terminated or revoked sooner. Performed at [facility], [place] [address] [place], [place] [address]       Studies: CT HEAD WO CONTRAST  Result Date: 11/05/2019 CLINICAL DATA:  Recent syncopal episode EXAM: CT HEAD WITHOUT CONTRAST TECHNIQUE: Contiguous axial images were obtained from the base of the skull through the vertex without intravenous contrast. COMPARISON:  01/31/2008 FINDINGS: Brain: Atrophic changes are noted. No findings to suggest acute hemorrhage, acute infarction or space-occupying  mass lesion are noted. 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 Vascular: No hyperdense vessel or unexpected calcification. Skull: Normal. Negative for fracture or focal lesion. Sinuses/Orbits: No acute finding. Other: None IMPRESSION: Chronic atrophic and ischemic changes without acute abnormality. Electronically Signed   By: Inez Catalina M.D.   On: 11/05/2019 01:59   CT ANGIO CHEST PE W OR WO CONTRAST  Result Date: 11/05/2019 CLINICAL DATA:  Shortness of breath.  PE suspected. EXAM: CT ANGIOGRAPHY CHEST WITH CONTRAST TECHNIQUE: Multidetector CT imaging of the chest was performed using the standard protocol during bolus administration of intravenous contrast. Multiplanar CT image reconstructions and MIPs were obtained to evaluate the vascular anatomy. CONTRAST:  126mL OMNIPAQUE IOHEXOL 350 MG/ML SOLN COMPARISON:  None. FINDINGS: Cardiovascular: Contrast injection is sufficient to demonstrate satisfactory opacification of the pulmonary arteries to the segmental level. There is no pulmonary embolus. The main pulmonary artery is within normal limits for size. There is no CT evidence of acute right heart strain. The visualized aorta is normal. Heart size is borderline enlarged. Coronary artery calcifications  are noted. Mediastinum/Nodes: --mediastinal and hilar adenopathy is noted. --No axillary lymphadenopathy. --No supraclavicular lymphadenopathy. --there is a left-sided thyroid nodule measuring approximately 2.7 cm. --The esophagus is unremarkable Lungs/Pleura: There are diffuse bilateral ground-glass airspace opacities. There is some interlobular septal thickening. There is scattered areas of consolidation bilaterally. There is no significant pleural effusion. There is no pneumothorax. The trachea is unremarkable. The lung volumes are low. There is scattered areas of bronchial wall thickening and mucus plugging. Upper Abdomen: No acute abnormality. Musculoskeletal: No chest wall abnormality. No acute or  significant osseous findings. Review of the MIP images confirms the above findings. IMPRESSION: 1. No acute pulmonary embolism. 2. Diffuse bilateral ground-glass airspace opacities with mild interlobular septal thickening is suspicious for developing pulmonary edema. An atypical infectious process is not excluded. 3. Scattered areas of consolidation bilaterally concerning for multifocal pneumonia or infectious bronchiolitis. 4. There is a 2.7 cm left-sided thyroid nodule. Outpatient nonemergent follow-up ultrasound is recommended.(Ref: J Am Coll Radiol. 2015 Feb;12(2): 143-50). Aortic Atherosclerosis (ICD10-I70.0). Electronically Signed   By: Constance Holster M.D.   On: 11/05/2019 03:59   DG Chest Port 1 View  Result Date: 11/04/2019 CLINICAL DATA:  Shortness of breath worsening in past couple days, decreased oxygen saturation in the mid to upper 80s EXAM: PORTABLE CHEST 1 VIEW COMPARISON:  Portable exam 2203 hours compared to 10/09/2019 FINDINGS: Normal heart size post coronary stenting. Stable mediastinal contours. Diffuse interstitial infiltrates throughout both lungs, similar to prior exam, at question pulmonary edema or atypical infection, chronic interstitial lung disease not excluded. No pleural effusion or pneumothorax. Scattered endplate spur formation thoracic spine. IMPRESSION: Diffuse interstitial infiltrates unchanged, question atypical infection, edema or chronic interstitial lung disease. Electronically Signed   By: Lavonia Dana M.D.   On: 11/04/2019 22:10   ECHOCARDIOGRAM COMPLETE  Result Date: 11/05/2019    ECHOCARDIOGRAM REPORT   Patient Name:   Kristine Oliver Date of Exam: 11/05/2019 Medical Rec #:  [id number]       Height:       59.0 in Accession #:    [id number]      Weight:       157.0 lb Date of Birth:  07/12/1931        BSA:          1.664 m Patient Age:    41 years        BP:           123/60 mmHg Patient Gender: F               HR:           92 bpm. Exam Location:  Inpatient Procedure:  2D Echo, Color Doppler and Cardiac Doppler Indications:    O97.35 Acute diastolic (congestive) heart failure  History:        Patient has prior history of Echocardiogram examinations, most                 recent 03/19/2018. CHF, CAD; Risk Factors:Hypertension, Diabetes                 and Dyslipidemia.  Sonographer:    Raquel Sarna Senior RDCS Referring Phys: [id number] [place]  1. Left ventricular ejection fraction, by estimation, is 65 to 70%. The left ventricle has normal function. The left ventricle has no regional wall motion abnormalities. Left ventricular diastolic function could not be evaluated.  2. Right ventricular systolic function is normal. The right ventricular size is mildly enlarged. There is normal  pulmonary artery systolic pressure. The estimated right ventricular systolic pressure is 81.2 mmHg.  3. Left atrial size was moderately dilated.  4. Right atrial size was mildly dilated.  5. The mitral valve is normal in structure. Mild mitral valve regurgitation.  6. The aortic valve is normal in structure. Aortic valve regurgitation is not visualized. Mild aortic valve sclerosis is present, with no evidence of aortic valve stenosis. Comparison(s): Prior images reviewed side by side. Rhythm is now atrial fibrillation, otherwise findings are similar to 2019. FINDINGS  Left Ventricle: Left ventricular ejection fraction, by estimation, is 65 to 70%. The left ventricle has normal function. The left ventricle has no regional wall motion abnormalities. The left ventricular internal cavity size was normal in size. There is  borderline concentric left ventricular hypertrophy. Left ventricular diastolic function could not be evaluated due to atrial fibrillation. Left ventricular diastolic function could not be evaluated. Right Ventricle: The right ventricular size is mildly enlarged. No increase in right ventricular wall thickness. Right ventricular systolic function is normal. There is normal  pulmonary artery systolic pressure. The tricuspid regurgitant velocity is 2.23  m/s, and with an assumed right atrial pressure of 3 mmHg, the estimated right ventricular systolic pressure is 75.1 mmHg. Left Atrium: Left atrial size was moderately dilated. Right Atrium: Right atrial size was mildly dilated. Pericardium: There is no evidence of pericardial effusion. Mitral Valve: The mitral valve is normal in structure. Mild mitral annular calcification. Mild mitral valve regurgitation. Tricuspid Valve: The tricuspid valve is normal in structure. Tricuspid valve regurgitation is mild. Aortic Valve: The aortic valve is normal in structure. Aortic valve regurgitation is not visualized. Mild aortic valve sclerosis is present, with no evidence of aortic valve stenosis. Pulmonic Valve: The pulmonic valve was normal in structure. Pulmonic valve regurgitation is not visualized. Aorta: The aortic root is normal in size and structure. IAS/Shunts: No atrial level shunt detected by color flow Doppler.  LEFT VENTRICLE PLAX 2D LVIDd:         3.60 cm LVIDs:         2.50 cm LV PW:         1.10 cm LV IVS:        1.20 cm  RIGHT VENTRICLE RV S prime:     11.90 cm/s TAPSE (M-mode): 1.9 cm LEFT ATRIUM             Index       RIGHT ATRIUM           Index LA diam:        4.70 cm 2.82 cm/m  RA Area:     19.20 cm LA Vol (A2C):   68.1 ml 40.93 ml/m RA Volume:   56.90 ml  34.20 ml/m LA Vol (A4C):   71.4 ml 42.91 ml/m LA Biplane Vol: 69.6 ml 41.83 ml/m  AORTIC VALVE LVOT Vmax:   81.60 cm/s LVOT Vmean:  63.800 cm/s LVOT VTI:    0.188 m  AORTA Ao Root diam: 2.80 cm Ao Asc diam:  3.10 cm TRICUSPID VALVE TR Peak grad:   19.9 mmHg TR Vmax:        223.00 cm/s  SHUNTS Systemic VTI: 0.19 m Dani Gobble Croitoru MD Electronically signed by Sanda Klein MD Signature Date/Time: 11/05/2019/11:44:26 AM    Final     Scheduled Meds: . doxycycline  100 mg Oral Q12H  . feeding supplement (ENSURE ENLIVE)  237 mL Oral BID BM  . insulin aspart  0-5 Units  Subcutaneous QHS  .  insulin aspart  0-9 Units Subcutaneous TID WC  . oxyCODONE  10 mg Oral Q6H  . sodium chloride flush  3 mL Intravenous Q12H    Continuous Infusions: . sodium chloride    . cefTRIAXone (ROCEPHIN)  IV       LOS: 0 days     Alma Friendly, MD [facility]  If 7PM-7AM, please contact night-coverage [email or web address] 11/05/2019, 12:55 PM

## 2019-11-05 NOTE — ED Notes (Signed)
Pt's O2 sat 85-90% RA while taking a couple steps in room. Pt stated "I can't breath". Pt back in bed with O2 [place] on.

## 2019-11-05 NOTE — Progress Notes (Signed)
Pt refuses to wear anti slip socks despite staff explaining to her that she is a high fall risk. Pt also refuses to allow bedside table to be placed across her lap while in bed, pt insists on sitting on the side of bed and staff is unable to turn bed alarm on while pt is on side of bed. Pt states she will 'definitely not try to get up by myself', but pt did attempt to stand on her own to try to straighten her clothing. Staff has repeatedly explained that these interventions are meant to keep her safe, pt states she understands but she has a particular way she has to do things.

## 2019-11-05 NOTE — Progress Notes (Signed)
During echo, patient is asking not to live anymore, verbalized wish to overdose. Might need chaplain consult?  Echocardiogram 2D Echocardiogram has been performed.  Kristine Oliver 11/05/2019, 10:13 AM

## 2019-11-05 NOTE — Progress Notes (Signed)
Chaplain responded to request from nurse.  Patient was quite conversant.  Her nephew  Kristine Oliver was bedside. Kristine Oliver seemed somewhat agitated and distant. "He has health issues too" says the patient. The nephew was urging--insistent patient  take her Lasix.  "The only reason she wants to die is that she doesn't want to pee on herself.  I'm here to take her to the potty. What's wrong with that?" Chaplain listened as patient said she was in discomfort "down there" and did not want to be catheritized.  She was very clear that she wanted to go into hospice and be cared for without treatment. Patient's daughter in law called while chaplain was bedside and chaplain was asked to speak with her.  "I'm getting there in a few hours" she said. She said that the patient was was mentally ill  And been saying she wanted to die for decades. The chaplain said it was not hospital policy to treat someone if they did not want to be treated.  It was this chaplain's brief judgement that patient was sound of mind was cognitively alert. 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  Her pain medications took affect and she looked more comfortable as the visit continued. She was thrilled with the sitter that was with her. Kristine Oliver, who was there because it was said she threatened suicide.  Kristine Oliver, the patient and chaplain had long discussion about death, dying and Kristine Oliver hope, given that it was [place].  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 And that her wishes for palliative care would be documented.  Chaplain offered prayer and departed.  Kristine Oliver Pager [phone]

## 2019-11-05 NOTE — Progress Notes (Signed)
I just had a conversation with patient in which she expressed that she wants to be comfort care and persue hospice for discharge. She says her quality of life is very poor at home and she doesn't want to get better, she just wants to have a peaceful and dignified death without pain. Her only regret is that she will not be able to see her grandchildren and great-grandchildren because of the pandemic. She is very tearful, but also very clear about her intentions. She agreed that she would also like to see pastoral care to speak with her and offer support. MD notified of need for palliative consult.

## 2019-11-05 NOTE — ED Notes (Signed)
Pt transported to CT ?

## 2019-11-05 NOTE — H&P (Signed)
History and Physical    Kristine Oliver ZOX:[id number] DOB: 01-17-1931 DOA: 11/04/2019  PCP: Lavone Orn, MD Patient coming from: Home  Chief Complaint: Shortness of breath  HPI: Kristine Oliver is a 86 y.o. female with medical history significant of CAD status post PCI, chronic diastolic congestive heart failure, persistent A. fib on Eliquis, CKD stage III-IV, type 2 diabetes, hypertension, hyperlipidemia, hypothyroidism, IBS, obesity (BMI 31.51) presenting to the ED via EMS for evaluation of shortness of breath.  EMS reported oxygen saturation in the mid to upper 80s while patient ambulated in her house.  Sats improved to 99% with 2 L supplemental oxygen.  Patient reports 1 week history of dyspnea even at rest.  Denies chest pain.  States she has postnasal drip and has been coughing a lot.  Reports eating a lot of potato chips and dip a week ago.  States she normally takes Lasix as needed but has not felt that she needed it this past week.  States she has passed out twice over this past week.  One time she woke up next to the commode and another time she passed out in her chair.  Reports bruising of her right forearm but denies any pain or injuries from the falls.  Denies headaches, back pain, hip pain, or pain in her arm/elbow.  She has already received both of her Covid vaccines, second dose was several weeks ago.  ED Course: No temperature recorded.  WBC count mildly elevated at 11.7.  Hemoglobin 10.3, no significant change from baseline.  Creatinine 1.1, improved compared to prior labs.  BNP 320.  High-sensitivity troponin negative.  EKG without acute ischemic changes.  SARS-CoV-2 PCR test pending. Chest x-ray showing diffuse interstitial infiltrates unchanged, concerning for atypical infection versus edema versus chronic interstitial lung disease. Patient received doxycycline and ceftriaxone.  Review of Systems:  All systems reviewed and apart from history of presenting illness, are  negative.  Past Medical History:  Diagnosis Date  . Anxiety   . Bradycardia   . CAD (coronary artery disease)    a. 08/2004 NSTEMI/PCI: LAD 79m/d (2.75 x 28 mm Taxus 2 DES);  b. 10/2010 Cath: LM nl, LAD patent stents w/ jailed septal, LCX 30, RCA 30; c. 06/2013 MV: No ischemia. Small fixed anteroseptal defect, ? scar vs atten. EF 62%; d. 03/2016 MV: EF 57%, Low risk.  . Chronic diastolic heart failure ([place])    a. Echo 7/13:  mod LVH, EF 65-70%, vigorous LV, Gr 1 diast dysfn, mild LAE; b. 05/2017 Echo: EF 60-65%, mod LVH, no rwma, Gr1 DD, mildly dil LA.  Marland Kitchen Chronic pain disorder   . CKD (chronic kidney disease), stage III   . DDD (degenerative disc disease), lumbosacral    , Spinal stenosis  . Diabetes mellitus ([place])    , Type II  . GERD (gastroesophageal reflux disease)   . HTN (hypertension)   . Hyperlipidemia   . Hypothyroidism   . IBS (irritable bowel syndrome)   . Lumbar disc disease   . Nonproliferative diabetic retinopathy associated with type 2 diabetes mellitus ([place])   . Obesity (BMI 30-39.9)   . Peripheral neuropathy    , Associated with DM  . Scoliosis     Past Surgical History:  Procedure Laterality Date  . BACK SURGERY    . CARDIAC CATHETERIZATION    . CARDIOVERSION N/A 08/25/2018   Procedure: CARDIOVERSION;  Surgeon: Jerline Pain, MD;  Location: [facility] END[facility]COPY;  Service: Cardiovascular;  Laterality: N/A;  .  CHOLECYSTECTOMY    . CORONARY STENT INTERVENTION N/A 03/21/2018   Procedure: CORONARY STENT INTERVENTION;  Surgeon: Jettie Booze, MD;  Location: [place] CV LAB;  Service: Cardiovascular;  Laterality: N/A;  . DILATION AND CURETTAGE OF UTERUS    . HEMILAMINOTOMY LUMBAR SPINE  2002   Rt L5, with decompression and foraminotomy  . INDUCED ABORTION     , x2  . LAMINOTOMY Right   . LEFT HEART CATH AND CORONARY ANGIOGRAPHY N/A 03/21/2018   Procedure: LEFT HEART CATH AND CORONARY ANGIOGRAPHY;  Surgeon: Jettie Booze, MD;  Location: [place] [facility];   Service: Cardiovascular;  Laterality: N/A;  . LUMBAR [place] SURGERY  07/1999   L5-S1  . PILONIDAL CYST EXCISION    . ROTATOR CUFF REPAIR    . TONSILLECTOMY    . TOTAL ABDOMINAL HYSTERECTOMY W/ BILATERAL SALPINGOOPHORECTOMY       reports that she has quit smoking. She quit after 35.00 years of use. She has never used smokeless tobacco. She reports that she does not drink alcohol or use drugs.  Allergies  Allergen Reactions  . Pravastatin Other (See Comments)    Muscle aches  . Prednisone Nausea And Vomiting    Family History  Problem Relation Age of Onset  . Heart failure Father   . Diabetes Father   . Coronary artery disease Mother     Prior to Admission medications   Medication Sig Start Date End Date Taking? Authorizing Provider  albuterol (VENTOLIN HFA) 108 (90 Base) MCG/ACT inhaler Inhale 1 puff into the lungs every 4 (four) hours as needed for wheezing or shortness of breath.  09/25/19   [provider]  amLODipine (NORVASC) 2.5 MG tablet Take 1 tablet (2.5 mg total) by mouth daily. 11/01/18   Jerline Pain, MD  antiseptic oral rinse (BIOTENE) LIQD 15 mLs by Mouth Rinse route 2 (two) times daily.     [provider]  apixaban (ELIQUIS) 2.5 MG TABS tablet Take 1 tablet (2.5 mg total) by mouth 2 (two) times daily. 10/31/18   Duke, Tami Lin, PA  ASPERCREME LIDOCAINE EX Apply 1 application topically 2 (two) times daily as needed (back/feet pain).    [provider]  Carboxymethylcellulose Sod PF (RETAINE CMC) 0.5 % SOLN Place 1 drop into both eyes daily.     [provider]  clindamycin (CLEOCIN T) 1 % lotion Apply 1 application topically 2 (two) times daily. Facial wash 11/16/13   [provider]  clopidogrel (PLAVIX) 75 MG tablet Take 1 tablet (75 mg total) by mouth daily. 03/23/18   Kroeger, Lorelee Cover., PA-C  cycloSPORINE (RESTASIS) 0.05 % ophthalmic emulsion Place 1 drop into both eyes 2 (two) times daily as needed (dry eyes).      [provider]  docusate sodium (COLACE) 100 MG capsule Take 100 mg by mouth daily.    [provider]  furosemide (LASIX) 20 MG tablet Take 1 tablet by mouth daily, may take a extra tablet daily only as needed if wt is up to 171 Patient taking differently: Take 20 mg by mouth daily. may take a extra tablet daily only as needed if wt is up to 171 12/01/18   Isaiah Serge, NP  gabapentin (NEURONTIN) 300 MG capsule Take 300 mg by mouth 2 (two) times daily.     [provider]  isosorbide mononitrate (IMDUR) 30 MG 24 hr tablet Take 1 tablet (30 mg total) by mouth daily. 07/06/19 10/08/28  Jerline Pain,  MD  levothyroxine (SYNTHROID, LEVOTHROID) 75 MCG tablet Take 75 mcg by mouth daily.    [provider]  Melatonin 3 MG TABS Take 3 mg by mouth at bedtime as needed (sleep).    [provider]  metFORMIN (GLUCOPHAGE) 500 MG tablet Take 500 mg by mouth 2 (two) times daily with a meal.  03/17/16   [provider]  nitroGLYCERIN (NITROSTAT) 0.4 MG SL tablet DISSOLVE 1 TABLET UNDER THE TONGUE EVERY 5 MINUTES FOR 3 DOSES AS NEEDED FOR CHEST PAIN Patient taking differently: Place 0.4 mg under the tongue every 5 (five) minutes x 3 doses as needed for chest pain.  01/23/19   Cheryln Manly, NP  oxyCODONE (OXY IR/ROXICODONE) 5 MG immediate release tablet Take 5 mg by mouth at bedtime. 10/19/18   [provider]  Oxycodone HCl 10 MG TABS Take 1 tablet (10 mg total) by mouth 4 (four) times daily. 03/10/19   Drenda Freeze, MD  pantoprazole (PROTONIX) 40 MG tablet TAKE 1 TABLET(40 MG) BY MOUTH DAILY Patient taking differently: Take 40 mg by mouth daily.  03/27/19   Jerline Pain, MD  ramipril (ALTACE) 5 MG capsule TAKE 1 CAPSULE(5 MG) BY MOUTH DAILY Patient taking differently: Take 5 mg by mouth daily.  07/19/18   Kroeger, Lorelee Cover., PA-C  rosuvastatin (CRESTOR) 20 MG tablet TAKE 1 TABLET(20 MG) BY MOUTH DAILY AT 6 PM Patient taking differently: Take  20 mg by mouth every evening.  05/26/19   Kroeger, Lorelee Cover., PA-C  sodium chloride (OCEAN) 0.65 % SOLN nasal spray Place 1 spray into both nostrils as needed for congestion (dryness).    [provider]  valACYclovir (VALTREX) 500 MG tablet Take 500 mg by mouth 2 (two) times daily.     [provider]  vitamin B-12 (CYANOCOBALAMIN) 500 MCG tablet Take 500 mcg by mouth daily.    [provider]    Physical Exam: Vitals:   11/04/19 2330 11/04/19 2345 11/05/19 0030 11/05/19 0045  BP: 121/68 126/65 134/76 133/70  Pulse: 78 74 77 79  Resp: 15 18 (!) 21 20  SpO2: 99% 100% 98% 98%  Weight:      Height:        Physical Exam  Constitutional: She is oriented to person, place, and time. She appears well-developed and well-nourished. No distress.  HENT:  Head: Normocephalic.  Eyes: Right eye exhibits no discharge. Left eye exhibits no discharge.  Neck: JVD present.  Mild JVD  Cardiovascular: Normal rate, regular rhythm and intact distal pulses.  Pulmonary/Chest: Effort normal. No respiratory distress. She has no wheezes. She has rales.  Bibasilar rales  Abdominal: Soft. Bowel sounds are normal. She exhibits no distension. There is no abdominal tenderness. There is no guarding.  Musculoskeletal:        General: Edema present.     Cervical back: Neck supple.     Comments: +2 pedal edema bilaterally  Neurological: She is alert and oriented to person, place, and time.  Skin: Skin is warm and dry. She is not diaphoretic.  Bruises noted on right forearm     Labs on Admission: I have personally reviewed following labs and imaging studies  CBC: Recent Labs  Lab 11/04/19 2146  WBC 11.7*  NEUTROABS 7.6  HGB 10.3*  HCT 33.7*  MCV 92.8  PLT 778*   Basic Metabolic Panel: Recent Labs  Lab 11/04/19 2146  NA 133*  K 4.6  CL 105  CO2 22  GLUCOSE 100*  BUN 18  CREATININE 1.18*  CALCIUM 11.0*   GFR: Estimated Creatinine Clearance: 29.3 mL/min (A) (by C-G  formula based on SCr of 1.18 mg/dL (H)). Liver Function Tests: No results for input(s): AST, ALT, ALKPHOS, BILITOT, PROT, ALBUMIN in the last 168 hours. No results for input(s): LIPASE, AMYLASE in the last 168 hours. No results for input(s): AMMONIA in the last 168 hours. Coagulation Profile: No results for input(s): INR, PROTIME in the last 168 hours. Cardiac Enzymes: No results for input(s): CKTOTAL, CKMB, CKMBINDEX, TROPONINI in the last 168 hours. BNP (last 3 results) No results for input(s): PROBNP in the last 8760 hours. HbA1C: No results for input(s): HGBA1C in the last 72 hours. CBG: Recent Labs  Lab 11/05/19 0101  GLUCAP 89   Lipid Profile: No results for input(s): CHOL, HDL, LDLCALC, TRIG, CHOLHDL, LDLDIRECT in the last 72 hours. Thyroid Function Tests: No results for input(s): TSH, T4TOTAL, FREET4, T3FREE, THYROIDAB in the last 72 hours. Anemia Panel: No results for input(s): VITAMINB12, FOLATE, FERRITIN, TIBC, IRON, RETICCTPCT in the last 72 hours. Urine analysis:    Component Value Date/Time   COLORURINE YELLOW 03/10/2019 1749   APPEARANCEUR HAZY (A) 03/10/2019 1749   LABSPEC 1.008 03/10/2019 1749   PHURINE 6.0 03/10/2019 1749   GLUCOSEU NEGATIVE 03/10/2019 1749   HGBUR SMALL (A) 03/10/2019 1749   BILIRUBINUR NEGATIVE 03/10/2019 1749   KETONESUR NEGATIVE 03/10/2019 1749   PROTEINUR NEGATIVE 03/10/2019 1749   UROBILINOGEN 0.2 05/01/2012 1913   NITRITE NEGATIVE 03/10/2019 1749   LEUKOCYTESUR LARGE (A) 03/10/2019 1749    Radiological Exams on Admission: DG Chest Port 1 View  Result Date: 11/04/2019 CLINICAL DATA:  Shortness of breath worsening in past couple days, decreased oxygen saturation in the mid to upper 80s EXAM: PORTABLE CHEST 1 VIEW COMPARISON:  Portable exam 2203 hours compared to 10/09/2019 FINDINGS: Normal heart size post coronary stenting. Stable mediastinal contours. Diffuse interstitial infiltrates throughout both lungs, similar to prior exam, at  question pulmonary edema or atypical infection, chronic interstitial lung disease not excluded. No pleural effusion or pneumothorax. Scattered endplate spur formation thoracic spine. IMPRESSION: Diffuse interstitial infiltrates unchanged, question atypical infection, edema or chronic interstitial lung disease. Electronically Signed   By: Lavonia Dana M.D.   On: 11/04/2019 22:10    EKG: Independently reviewed.  Atrial fibrillation, heart rate 77.  Baseline wander in aVF.  No acute ischemic changes.  Assessment/Plan Principal Problem:   Acute respiratory failure (HCC) Active Problems:   CAD (coronary artery disease)   Essential hypertension   Acute on chronic diastolic CHF (congestive heart failure) (HCC)   Syncope   Acute hypoxic respiratory failure suspect secondary to acute on chronic diastolic CHF and possible pneumonia: Oxygen saturation in the mid to upper 80s with ambulation.  Currently satting well on 2 L supplemental oxygen. Chest x-ray showing diffuse interstitial infiltrates unchanged, concerning for atypical infection versus edema versus chronic interstitial lung disease.  Labs showing only mild leukocytosis.  COVID-19 infection less likely as patient reports receiving her Covid vaccines several weeks ago.  BNP mildly elevated and does appear volume overloaded on exam.  Last echo done in August 2019 with normal LVEF and grade 1 diastolic dysfunction.  PE less likely as patient is on chronic anticoagulation for A. Fib. -Plan is to give IV Lasix 40 mg once, reassess in a.m. and order additional doses if needed.  Monitor intake and output, daily weights, and low-sodium diet with fluid restriction.  Order repeat echocardiogram. Continue antibiotics and check  procalcitonin level.  SARS-CoV-2 PCR test pending, continue airborne and contact precautions.   Check D-dimer level, if elevated, CT angiogram to rule out PE.  Continue supplemental oxygen, wean as tolerated.  Recent fall/syncope: Possibly  related to hypoxia.  High-sensitivity troponin negative and EKG without acute ischemic changes.  Patient denies chest pain.  Does have A. fib but currently rate controlled.  Continue cardiac monitoring.  Check D-dimer level, if elevated, CT angiogram to rule out PE.  Echocardiogram as above.  Order head CT to rule out intracranial bleed given advanced age and chronic anticoagulation.  CAD status post PCI:   Continue cardiac monitoring.  Resume home medications after pharmacy med rec is completed.  Persistent atrial fibrillation: Status post DCCV in January 2020.  Currently in A. fib but rate controlled.  Resume home Eliquis if head CT negative for acute intracranial bleed.  Hypertension: Currently normotensive  CKD stage III-IV: Renal function currently stable  Well-controlled type 2 diabetes: A1c 6.5 on 10/09/2019.  Order sliding scale insulin ACHS and CBG checks.  Pharmacy med rec pending.  DVT prophylaxis: On chronic anticoagulation with Eliquis, resume if head CT negative for acute intracranial bleed. Code Status: Patient wishes to be DNR. Family Communication: No family available at this time. Disposition Plan: Anticipate discharge after clinical improvement.  Has a new supplemental oxygen requirement. Consults called: None Admission status: It is my clinical opinion that admission to INPATIENT is reasonable and necessary because of the expectation that this patient will require hospital care that crosses at least 2 midnights to treat this condition based on the medical complexity of the problems presented.  Given the aforementioned information, the predictability of an adverse outcome is felt to be significant.  The medical decision making on this patient was of high complexity and the patient is at high risk for clinical deterioration, therefore this is a level 3 visit.  Kristine Leff MD [facility]  If 7PM-7AM, please contact night-coverage [email or web address]  11/05/2019, 1:16  AM

## 2019-11-05 NOTE — Progress Notes (Addendum)
Family called unit desk and alerted staff that patient has oxycodone home medications on her possession with plans to take and overdose. This RN entered patients room immediately with [place] Sox NT and [place] NT. Patient very angry and yelling at staff to leave her room and that she wants to die. When asked about the medications patient admitted to having them. 2 whole pills and 5 half pills of what appears to be 5mg  Oxy pills.  Medications confiscated and placed in sealable security bag [id number]. Patient refusing to sign "Receipt for patient's Home Medications Stored By Pharmacy". Meds delivered to pharmacy. MD notified. Sitter placed at bedside.

## 2019-11-05 NOTE — Consult Note (Addendum)
[facility] Face-to-Face Psychiatry Consult   Reason for Consult: Concerns regarding suicidality Referring Physician: Horris Latino Patient Identification: Kristine Oliver MRN:  [id number] Principal Diagnosis: Acute respiratory failure [facility]) Diagnosis:  Principal Problem:   Acute respiratory failure ([place]) Active Problems:   CAD (coronary artery disease)   Essential hypertension   Acute on chronic diastolic CHF (congestive heart failure) ([place])   Syncope   Adjustment disorder with depressed mood   Total Time spent with patient: 45 minutes  Subjective:   Kristine Oliver is a 86 y.o. female patient admitted on 4/3 due to multiple medical comorbidities, and general debility.  On presentation she was hypoxic, complaining of shortness of breath, and her multiple medical comorbidities include coronary artery disease, chronic diastolic congestive heart failure, persistent atrial fibrillation, CKD, type 2 diabetes, hypothyroidism, hyperlipidemia, hypertension, obesity, irritable bowel syndrome-general weakness and debility  From a psychiatric standpoint the patient states that she has a history of some depression and states "they told me I was bipolar, but they were telling everybody that back then" she denies manic episodes but describes her self as "a character" and true to this, she is histrionic on exam. When asked to specifically clarify the note of 10:55 AM when the nurse documented the patient had discussed the oxycodone in her possession that she plans to overdose on, she states "I have them in my pockets at home, I carry them around, I am not going to overdose" she states she was misunderstood however the note is very clear, she is even described as "very angry yelling at staff to leave her room and that she wants to die" medications were of course taken from her at admission- The patient states "I do not abuse them"  Again, the patient further elaborates, that due to her level of debility she is "tired  of it" and she would like just comfort care/palliative care and does not want to continue to take Lasix. When asked if she can be safe and not monitored continually she states that as long as she is taking Lasix she "wants someone here" but not because she is planning to harm herself here. Further, patient spoke extremely highly of her visit with the chaplain, found her words comforting and motivating to continue on without harming herself.  Past Psychiatric History: History of depression, history of possible bipolar symptoms  Risk to Self:   Moderate Risk to Others:  Negligible Prior Inpatient Therapy:  Denies Prior Outpatient Therapy:  Reports prior outpatient therapy  Past Medical History:  Past Medical History:  Diagnosis Date  . Anxiety   . Bradycardia   . CAD (coronary artery disease)    a. 08/2004 NSTEMI/PCI: LAD 36m/d (2.75 x 28 mm Taxus 2 DES);  b. 10/2010 Cath: LM nl, LAD patent stents w/ jailed septal, LCX 30, RCA 30; c. 06/2013 MV: No ischemia. Small fixed anteroseptal defect, ? scar vs atten. EF 62%; d. 03/2016 MV: EF 57%, Low risk.  . Chronic diastolic heart failure ([place])    a. Echo 7/13:  mod LVH, EF 65-70%, vigorous LV, Gr 1 diast dysfn, mild LAE; b. 05/2017 Echo: EF 60-65%, mod LVH, no rwma, Gr1 DD, mildly dil LA.  Marland Kitchen Chronic pain disorder   . CKD (chronic kidney disease), stage III   . DDD (degenerative disc disease), lumbosacral    , Spinal stenosis  . Diabetes mellitus ([place])    , Type II  . GERD (gastroesophageal reflux disease)   . HTN (hypertension)   . Hyperlipidemia   .  Hypothyroidism   . IBS (irritable bowel syndrome)   . Lumbar disc disease   . Nonproliferative diabetic retinopathy associated with type 2 diabetes mellitus ([place])   . Obesity (BMI 30-39.9)   . Peripheral neuropathy    , Associated with DM  . Scoliosis     Past Surgical History:  Procedure Laterality Date  . BACK SURGERY    . CARDIAC CATHETERIZATION    . CARDIOVERSION N/A 08/25/2018    Procedure: CARDIOVERSION;  Surgeon: Jerline Pain, MD;  Location[facility] ENDOSCOPY;  Service: Cardiovascular;  Laterality: N/A;  . CHOLECYSTECTOMY    . CORONARY STENT INTERVENTION N/A 03/21/2018   Procedure: CORONARY STENT INTERVENTION;  Surgeon: Jettie Booze, MD;  Location: [place] CV LAB;  Service: Cardiovascular;  Laterality: N/A;  . DILATION AND CURETTAGE OF UTERUS    . HEMILAMINOTOMY LUMBAR SPINE  2002   Rt L5, with decompression and foraminotomy  . INDUCED ABORTION     , x2  . LAMINOTOMY Right   . LEFT HEART CATH AND CORONARY ANGIOGRAPHY N/A 03/21/2018   Procedure: LEFT HEART CATH AND CORONARY ANGIOGRAPHY;  Surgeon: Jettie Booze, MD;  Location: [place] [facility] LAB;  Service: Cardiovascular;  Laterality: N/A;  . LUMBAR [place] SURGERY  07/1999   L5-S1  . PILONIDAL CYST EXCISION    . ROTATOR CUFF REPAIR    . TONSILLECTOMY    . TOTAL ABDOMINAL HYSTERECTOMY W/ BILATERAL SALPINGOOPHORECTOMY     Family History:  Family History  Problem Relation Age of Onset  . Heart failure Father   . Diabetes Father   . Coronary artery disease Mother    Family Psychiatric  History: ukn Social History:  Social History   Substance and Sexual Activity  Alcohol Use No   Comment: social drinking, not often     Social History   Substance and Sexual Activity  Drug Use No    Social History   Socioeconomic History  . Marital status: Divorced    Spouse name: Not on file  . Number of children: Not on file  . Years of education: Not on file  . Highest education level: Not on file  Occupational History  . Occupation: retired  Tobacco Use  . Smoking status: Former Smoker    Years: 35.00  . Smokeless tobacco: Never Used  Substance and Sexual Activity  . Alcohol use: No    Comment: social drinking, not often  . Drug use: No  . Sexual activity: Not on file  Other Topics Concern  . Not on file  Social History Narrative   Divorced.  Lives with her nephew in [place].  She walks  most days of the week.   Social Determinants of Health   Financial Resource Strain:   . Difficulty of Paying Living Expenses:   Food Insecurity:   . Worried About Charity fundraiser in the Last Year:   . Arboriculturist in the Last Year:   Transportation Needs:   . Film/video editor (Medical):   Marland Kitchen Lack of Transportation (Non-Medical):   Physical Activity:   . Days of Exercise per Week:   . Minutes of Exercise per Session:   Stress:   . Feeling of Stress :   Social Connections:   . Frequency of Communication with Friends and Family:   . Frequency of Social Gatherings with Friends and Family:   . Attends Religious Services:   . Active Member of Clubs or Organizations:   . Attends Club or  Organization Meetings:   Marland Kitchen Marital Status:    Additional Social History:    Allergies:   Allergies  Allergen Reactions  . Pravastatin Other (See Comments)    Muscle aches  . Prednisone Nausea And Vomiting    Labs:  Results for orders placed or performed during the hospital encounter of 11/04/19 (from the past 48 hour(s))  Basic metabolic panel     Status: Abnormal   Collection Time: 11/04/19  9:46 PM  Result Value Ref Range   Sodium 133 (L) 135 - 145 mmol/L   Potassium 4.6 3.5 - 5.1 mmol/L   Chloride 105 98 - 111 mmol/L   CO2 22 22 - 32 mmol/L   Glucose, Bld 100 (H) 70 - 99 mg/dL    Comment: Glucose reference range applies only to samples taken after fasting for at least 8 hours.   BUN 18 8 - 23 mg/dL   Creatinine, Ser 1.18 (H) 0.44 - 1.00 mg/dL   Calcium 11.0 (H) 8.9 - 10.3 mg/dL   GFR calc non Af Amer 42 (L) >60 mL/min   GFR calc Af Amer 48 (L) >60 mL/min   Anion gap 6 5 - 15    Comment: Performed at [facility] [address] [place], [place] [address]  Brain natriuretic peptide     Status: Abnormal   Collection Time: 11/04/19  9:46 PM  Result Value Ref Range   B Natriuretic Peptide 320.9 (H) 0.0 - 100.0 pg/mL    Comment: Performed at [facility] [address], [place], [place] [address]  Troponin I (High Sensitivity)     Status: None   Collection Time: 11/04/19  9:46 PM  Result Value Ref Range   Troponin I (High Sensitivity) 11 <18 ng/L    Comment: (NOTE) Elevated high sensitivity troponin I (hsTnI) values and significant  changes across serial measurements may suggest ACS but many other  chronic and acute conditions are known to elevate hsTnI results.  Refer to the "Links" section for chest pain algorithms and additional  guidance. Performed at [facility], [place] [address], [place], [place] [address]   CBC with Differential     Status: Abnormal   Collection Time: 11/04/19  9:46 PM  Result Value Ref Range   WBC 11.7 (H) 4.0 - 10.5 K/uL   RBC 3.63 (L) 3.87 - 5.11 MIL/uL   Hemoglobin 10.3 (L) 12.0 - 15.0 g/dL   HCT 33.7 (L) 36.0 - 46.0 %   MCV 92.8 80.0 - 100.0 fL   MCH 28.4 26.0 - 34.0 pg   MCHC 30.6 30.0 - 36.0 g/dL   RDW 18.0 (H) 11.5 - 15.5 %   Platelets 407 (H) 150 - 400 K/uL   nRBC 0.0 0.0 - 0.2 %   Neutrophils Relative % 65 %   Neutro Abs 7.6 1.7 - 7.7 K/uL   Lymphocytes Relative 17 %   Lymphs Abs 2.0 0.7 - 4.0 K/uL   Monocytes Relative 8 %   Monocytes Absolute 0.9 0.1 - 1.0 K/uL   Eosinophils Relative 9 %   Eosinophils Absolute 1.1 (H) 0.0 - 0.5 K/uL   Basophils Relative 0 %   Basophils Absolute 0.1 0.0 - 0.1 K/uL   Immature Granulocytes 1 %   Abs Immature Granulocytes 0.09 (H) 0.00 - 0.07 K/uL    Comment: Performed at [facility] [address] [place], [place] [address]  SARS CORONAVIRUS 2 (TAT 6-24 HRS) Nasopharyngeal Nasopharyngeal Swab  Status: None   Collection Time: 11/05/19 12:01 AM   Specimen: Nasopharyngeal Swab  Result Value Ref Range   SARS Coronavirus 2 NEGATIVE NEGATIVE    Comment: (NOTE) SARS-CoV-2 target nucleic acids are NOT DETECTED. The SARS-CoV-2 RNA is generally detectable in upper and lower respiratory specimens during the acute phase of infection. Negative results do  not preclude SARS-CoV-2 infection, do not rule out co-infections with other pathogens, and should not be used as the sole basis for treatment or other patient management decisions. Negative results must be combined with clinical observations, patient history, and epidemiological information. The expected result is Negative. Fact Sheet for Patients: SugarRoll.be Fact Sheet for Healthcare Providers: https://www.woods-mathews.com/ This test is not yet approved or cleared by the [place] FDA and  has been authorized for detection and/or diagnosis of SARS-CoV-2 by FDA under an Emergency Use Authorization (EUA). This EUA will remain  in effect (meaning this test can be used) for the duration of the COVID-19 declaration under Section 56 4(b)(1) of the Act, 21 U.S.C. section 360bbb-3(b)(1), unless the authorization is terminated or revoked sooner. Performed at [facility], [place] [address] [place], [place] [address]   CBG monitoring, ED     Status: None   Collection Time: 11/05/19  1:01 AM  Result Value Ref Range   Glucose-Capillary 89 70 - 99 mg/dL    Comment: Glucose reference range applies only to samples taken after fasting for at least 8 hours.  D-dimer, quantitative (not at [facility])     Status: Abnormal   Collection Time: 11/05/19  1:09 AM  Result Value Ref Range   D-Dimer, Quant 2.11 (H) 0.00 - 0.50 ug/mL-FEU    Comment: (NOTE) At the manufacturer cut-off of 0.50 ug/mL FEU, this assay has been documented to exclude PE with a sensitivity and negative predictive value of 97 to 99%.  At this time, this assay has not been approved by the FDA to exclude DVT/VTE. Results should be correlated with clinical presentation. Performed at [facility], [place] [address] [place], [place] [address]   Procalcitonin - Baseline     Status: None   Collection Time: 11/05/19  1:09 AM  Result Value Ref Range   Procalcitonin <0.10 ng/mL     Comment:        Interpretation: PCT (Procalcitonin) <= 0.5 ng/mL: Systemic infection (sepsis) is not likely. Local bacterial infection is possible. (NOTE)       Sepsis PCT Algorithm           Lower Respiratory Tract                                      Infection PCT Algorithm    ----------------------------     ----------------------------         PCT < 0.25 ng/mL                PCT < 0.10 ng/mL         Strongly encourage             Strongly discourage   discontinuation of antibiotics    initiation of antibiotics    ----------------------------     -----------------------------       PCT 0.25 - 0.50 ng/mL            PCT 0.10 - 0.25 ng/mL  OR       >80% decrease in PCT            Discourage initiation of                                            antibiotics      Encourage discontinuation           of antibiotics    ----------------------------     -----------------------------         PCT >= 0.50 ng/mL              PCT 0.26 - 0.50 ng/mL               AND        <80% decrease in PCT             Encourage initiation of                                             antibiotics       Encourage continuation           of antibiotics    ----------------------------     -----------------------------        PCT >= 0.50 ng/mL                  PCT > 0.50 ng/mL               AND         increase in PCT                  Strongly encourage                                      initiation of antibiotics    Strongly encourage escalation           of antibiotics                                     -----------------------------                                           PCT <= 0.25 ng/mL                                                 OR                                        > 80% decrease in PCT                                     Discontinue / Do not initiate  antibiotics Performed at [facility], [place] [address], [place],  [place] [address]   CBC     Status: Abnormal   Collection Time: 11/05/19  1:12 AM  Result Value Ref Range   WBC 8.9 4.0 - 10.5 K/uL   RBC 3.55 (L) 3.87 - 5.11 MIL/uL   Hemoglobin 10.4 (L) 12.0 - 15.0 g/dL   HCT 33.2 (L) 36.0 - 46.0 %   MCV 93.5 80.0 - 100.0 fL   MCH 29.3 26.0 - 34.0 pg   MCHC 31.3 30.0 - 36.0 g/dL   RDW 17.9 (H) 11.5 - 15.5 %   Platelets 373 150 - 400 K/uL   nRBC 0.0 0.0 - 0.2 %    Comment: Performed at [facility], [place] [address], [place], [place] [address]  Glucose, capillary     Status: None   Collection Time: 11/05/19  7:47 AM  Result Value Ref Range   Glucose-Capillary 75 70 - 99 mg/dL    Comment: Glucose reference range applies only to samples taken after fasting for at least 8 hours.  Glucose, capillary     Status: Abnormal   Collection Time: 11/05/19 12:35 PM  Result Value Ref Range   Glucose-Capillary 131 (H) 70 - 99 mg/dL    Comment: Glucose reference range applies only to samples taken after fasting for at least 8 hours.    Current Facility-Administered Medications  Medication Dose Route Frequency Provider Last Rate Last Admin  . 0.9 %  sodium chloride infusion  250 mL Intravenous PRN Shela Leff, MD      . acetaminophen (TYLENOL) tablet 650 mg  650 mg Oral Q4H PRN Shela Leff, MD      . amLODipine (NORVASC) tablet 2.5 mg  2.5 mg Oral Daily [place] Friendly, MD      . apixaban Arne [place]) tablet 2.5 mg  2.5 mg Oral BID [place] Friendly, MD      . cefTRIAXone (ROCEPHIN) 1 g in sodium chloride 0.9 % 100 mL IVPB  1 g Intravenous Q24H Shela Leff, MD      . clopidogrel (PLAVIX) tablet 75 mg  75 mg Oral Daily [place] Friendly, MD      . cycloSPORINE (RESTASIS) 0.05 % ophthalmic emulsion 1 drop  1 drop Both Eyes BID PRN [place] Friendly, MD      . docusate sodium (COLACE) capsule 100 mg  100 mg Oral Daily [place] Friendly, MD      . doxycycline (VIBRA-TABS) tablet 100 mg  100 mg Oral Q12H Shela Leff, MD   100 mg  at 11/05/19 0900  . feeding supplement (ENSURE ENLIVE) (ENSURE ENLIVE) liquid 237 mL  237 mL Oral BID BM Shela Leff, MD   237 mL at 11/05/19 0859  . furosemide (LASIX) injection 40 mg  40 mg Intravenous Daily [place] Friendly, MD      . gabapentin (NEURONTIN) capsule 300 mg  300 mg Oral BID [place] Friendly, MD      . insulin aspart (novoLOG) injection 0-5 Units  0-5 Units Subcutaneous QHS Shela Leff, MD      . insulin aspart (novoLOG) injection 0-9 Units  0-9 Units Subcutaneous TID WC Shela Leff, MD      . Derrill Memo ON 11/06/2019] levothyroxine (SYNTHROID) tablet 75 mcg  75 mcg Oral Daily [place] Friendly, MD      . oxyCODONE (Oxy IR/ROXICODONE) immediate release tablet 10 mg  10 mg Oral Q6H PRN [place] Friendly, MD      .  pantoprazole (PROTONIX) EC tablet 40 mg  40 mg Oral Daily [place] Friendly, MD      . Derrill Memo ON 11/06/2019] prenatal multivitamin tablet 1 tablet  1 tablet Oral Q1200 Johnn Hai, MD      . sodium chloride flush (NS) 0.9 % injection 3 mL  3 mL Intravenous Q12H Shela Leff, MD   3 mL at 11/05/19 0900  . sodium chloride flush (NS) 0.9 % injection 3 mL  3 mL Intravenous PRN Shela Leff, MD      . valACYclovir (VALTREX) tablet 500 mg  500 mg Oral BID [place] Friendly, MD        Musculoskeletal: Strength & Muscle Tone: Probably decreased Gait & Station: States she cannot stand  Psychiatric Specialty Exam: Physical Exam  Review of Systems  Blood pressure 123/60, pulse 80, temperature 97.6 F (36.4 C), temperature source Oral, resp. rate 17, height 4\' 11"  (1.499 m), weight 71.2 kg, SpO2 96 %.Body mass index is 31.7 kg/m.  General Appearance: Casual  Eye Contact:  Good  Speech:  Normal Rate  Volume:  Increased at intervals  Mood:  Difficult to gauge, somewhat histrionic but denies depression  Affect:  Full Range  Thought Process:  Coherent and Goal Directed  Orientation:  Full (Time, Place, and Person)  Thought  Content:  Tangential and No evidence of psychosis or racing thoughts  Suicidal Thoughts:  Yes.  without intent/plan/generally passive no plans to harm herself here, understands what it is to contract for safety and will do so  Homicidal Thoughts:  No  Memory:  Immediate;   Poor Recent;   Fair Remote;   Fair patient would not participate in full memory testing but insisted she does have word finding difficulties and short-term memory issues but kept saying "I am smart" and again I think she was fearful of exposing some deficits that might indicate mild cognitive impairment so she would not repeat 3 of 3 just stating "I know that"  Judgement:  Poor  Insight:  Fair  Psychomotor Activity:  Normal  Concentration:  Concentration: Fair and Attention Span: Fair  Recall:  [place] of Knowledge:  Good  Language:  Good  Akathisia:  Negative  Handed:  Right  AIMS (if indicated):     Assets:  Resilience Social Support  ADL's:  Intact  Cognition:  WNL  Sleep:       Assessment Adjustment disorder with depression- Rule out some degree of mild cognitive impairment 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  The standard approach in these situations is to convince the patient to wait until they are out of the hospital, have achieved a baseline level of health, before they make such decisions.  She understands that making these decisions in the midst of medical and psychological stress would be misguided, and therefore she does agree to make no quick decisions while here regarding her future. She understands what it is to contract for safety and will do so.  She still, however, requests someone be with her- but not because she fears she will harm her self but because she feels a lack of control when she takes  Lasix.  Thus, one-to-one precautions are left to nursing discretion.  With regards to diagnostic clarity, I think the patient is simply dramatic and prone to histrionics, self described "  character" and possibly made worse by mild cognitive impairment - I made it clear to her that when she screams that she wants to die and yells at the staff these things are going to be taken seriously, but in summary, I think that her threats of self-harm or more histrionics than reflective of genuine plans  I did add folate and B12 to help with short-term memory  Johnn Hai, MD 11/05/2019 3:28 PM

## 2019-11-06 ENCOUNTER — Inpatient Hospital Stay (HOSPITAL_COMMUNITY): Payer: Medicare PPO

## 2019-11-06 DIAGNOSIS — Z515 Encounter for palliative care: Secondary | ICD-10-CM

## 2019-11-06 DIAGNOSIS — I10 Essential (primary) hypertension: Secondary | ICD-10-CM

## 2019-11-06 DIAGNOSIS — I5033 Acute on chronic diastolic (congestive) heart failure: Secondary | ICD-10-CM

## 2019-11-06 DIAGNOSIS — F4321 Adjustment disorder with depressed mood: Secondary | ICD-10-CM

## 2019-11-06 DIAGNOSIS — Z66 Do not resuscitate: Secondary | ICD-10-CM

## 2019-11-06 DIAGNOSIS — I2510 Atherosclerotic heart disease of native coronary artery without angina pectoris: Secondary | ICD-10-CM

## 2019-11-06 LAB — BASIC METABOLIC PANEL
Anion gap: 12 (ref 5–15)
BUN: 19 mg/dL (ref 8–23)
CO2: 20 mmol/L — ABNORMAL LOW (ref 22–32)
Calcium: 10.8 mg/dL — ABNORMAL HIGH (ref 8.9–10.3)
Chloride: 104 mmol/L (ref 98–111)
Creatinine, Ser: 1.21 mg/dL — ABNORMAL HIGH (ref 0.44–1.00)
GFR calc Af Amer: 47 mL/min — ABNORMAL LOW (ref >60)
GFR calc non Af Amer: 40 mL/min — ABNORMAL LOW (ref >60)
Glucose, Bld: 109 mg/dL — ABNORMAL HIGH (ref 70–99)
Potassium: 4.2 mmol/L (ref 3.5–5.1)
Sodium: 136 mmol/L (ref 135–145)

## 2019-11-06 LAB — CBC WITH DIFFERENTIAL/PLATELET
Abs Immature Granulocytes: 0.07 10*3/uL (ref 0.00–0.07)
Basophils Absolute: 0.1 10*3/uL (ref 0.0–0.1)
Basophils Relative: 1 %
Eosinophils Absolute: 0.8 10*3/uL — ABNORMAL HIGH (ref 0.0–0.5)
Eosinophils Relative: 9 %
HCT: 33.4 % — ABNORMAL LOW (ref 36.0–46.0)
Hemoglobin: 10.5 g/dL — ABNORMAL LOW (ref 12.0–15.0)
Immature Granulocytes: 1 %
Lymphocytes Relative: 18 %
Lymphs Abs: 1.6 10*3/uL (ref 0.7–4.0)
MCH: 28.7 pg (ref 26.0–34.0)
MCHC: 31.4 g/dL (ref 30.0–36.0)
MCV: 91.3 fL (ref 80.0–100.0)
Monocytes Absolute: 0.6 10*3/uL (ref 0.1–1.0)
Monocytes Relative: 7 %
Neutro Abs: 5.9 10*3/uL (ref 1.7–7.7)
Neutrophils Relative %: 64 %
Platelets: 365 10*3/uL (ref 150–400)
RBC: 3.66 MIL/uL — ABNORMAL LOW (ref 3.87–5.11)
RDW: 17.7 % — ABNORMAL HIGH (ref 11.5–15.5)
WBC: 9 10*3/uL (ref 4.0–10.5)
nRBC: 0 % (ref 0.0–0.2)

## 2019-11-06 LAB — GLUCOSE, CAPILLARY: Glucose-Capillary: 114 mg/dL — ABNORMAL HIGH (ref 70–99)

## 2019-11-06 MED ORDER — FUROSEMIDE 20 MG PO TABS
20.0000 mg | ORAL_TABLET | Freq: Every day | ORAL | Status: DC
  Administered 2019-11-07 – 2019-11-10 (×4): 20 mg via ORAL
  Filled 2019-11-06 (×4): qty 1

## 2019-11-06 NOTE — Progress Notes (Signed)
PROGRESS NOTE  Kristine Oliver NUU:[id number] DOB: 1931-01-20 DOA: 11/04/2019 PCP: Lavone Orn, MD  HPI/Recap of past 24 hours: HPI from Dr Damita Dunnings Kristine Oliver is a 86 y.o. female with medical history significant of CAD status post PCI, chronic diastolic congestive heart failure, persistent A. fib on Eliquis, CKD stage III-IV, type 2 diabetes, hypertension, hyperlipidemia, hypothyroidism, IBS, obesity (BMI 31.51) presenting to the ED via EMS for evaluation of shortness of breath.  EMS reported oxygen saturation in the mid to upper 80s while patient ambulated in her house.  Sats improved to 99% with 2 L supplemental oxygen.  Patient reports 1 week history of dyspnea even at rest.  Denies chest pain.  States she has postnasal drip and has been coughing a lot.  Reports eating a lot of potato chips and dip a week ago.  States she normally takes Lasix as needed but has not felt that she needed it this past week.  States she has passed out twice over this past week.  One time she woke up next to the commode and another time she passed out in her chair.  Reports bruising of her right forearm but denies any pain or injuries from the falls. Pt has already received both of her Covid vaccines, second dose was several weeks ago. In the ED, WBC count mildly elevated at 11.7.  Hemoglobin 10.3, no significant change from baseline.  Creatinine 1.1, improved compared to prior labs.  BNP 320.  High-sensitivity troponin negative.  EKG without acute ischemic changes. Chest x-ray showing diffuse interstitial infiltrates unchanged, concerning for atypical infection versus edema versus chronic interstitial lung disease. Patient admitted for further management.       Today, patient denies any new complaints, continues to have multiple complaints, including wanting to be hospice and tired of it all.  Continues to complain about being on Lasix, due to frequent urination.  Reports pain all over, denies any worsening  shortness of breath, chest pain, abdominal pain, nausea/vomiting, fever/chills.     Assessment/Plan: Principal Problem:   Acute respiratory failure (HCC) Active Problems:   CAD (coronary artery disease)   Essential hypertension   Acute on chronic diastolic CHF (congestive heart failure) (HCC)   Syncope   Adjustment disorder with depressed mood   Palliative care by specialist   DNR (do not resuscitate)   Acute hypoxic respiratory failure likely 2/2 acute on chronic diastolic HF/CAP Currently afebrile, with no leukocytosis Currently on 2 L via nasal cannula saturating above 95%, wean off BNP 320, troponin negative COVID-19 virus negative, procalcitonin negative Chest x-ray showed diffuse interstitial infiltrates concerning for atypical infection, edema, chronic interstitial lung disease D-dimer mildly elevated, CTA chest showed no PE, diffuse bilateral groundglass airspace opacity suspicious for developing pulmonary edema versus multifocal pneumonia Echo with EF of 5 to 70%, no regional wall motion abnormality, unable to evaluate diastolic function due to persistent A. fib Continue doxycycline, azithromycin Continue diuresis with home dose Lasix Strict I's and O's, daily weights  Recurrent falls/?syncope Head CT unremarkable PT/OT  CAD status post stents Currently chest pain-free Continue Plavix  Diabetes mellitus type 2 Last A1c 6.5 SSI, Accu-Cheks, hypoglycemic protocol  Persistent A. Fib Status post cardioversion in 08/2018 Currently in A. fib, rate controlled Continue Eliquis  Hypertension BP soft Hold home amlodipine, imdur, ramipril,  CKD stage III Creatinine currently at baseline Daily BMP  Hypothyroidism Continue Synthroid  Obesity Lifestyle modification advised  Possible depression/goals of care discussion Psychiatry and palliative care have been consulted-patient  with histrionic behavior, no intent to harm herself, according to family, her usual  behavior Pt has been placed on one-to-one sitter Plan to DC home, not meeting criteria for hospice         Malnutrition Type:      Malnutrition Characteristics:      Nutrition Interventions:       Estimated body mass index is 31.57 kg/m as calculated from the following:   Height as of this encounter: 4\' 11"  (1.499 m).   Weight as of this encounter: 70.9 kg.     Code Status: DNR  Family Communication: Discussed extensively with patient  Disposition Plan: Patient from home, still needs IV antibiotics. Anticipate discharge in the next 24 to 48 hours   Consultants:  Psychiatry  Palliative care  Procedures:  None  Antimicrobials:  Ceftriaxone  Doxycycline  DVT prophylaxis: Eliquis   Objective: Vitals:   11/06/19 0500 11/06/19 0746 11/06/19 1229 11/06/19 1609  BP:  123/65  (!) 108/48  Pulse:  70  71  Resp:      Temp:  (!) 97.5 F (36.4 C)  (!) 97.5 F (36.4 C)  TempSrc:  Oral    SpO2:  97% 97% 94%  Weight: 70.9 kg     Height:        Intake/Output Summary (Last 24 hours) at 11/06/2019 1736 Last data filed at 11/06/2019 1300 Gross per 24 hour  Intake 700 ml  Output 800 ml  Net -100 ml   Filed Weights   11/04/19 2136 11/05/19 0247 11/06/19 0500  Weight: 70.8 kg 71.2 kg 70.9 kg    Exam:  General: NAD  Cardiovascular: S1, S2 present  Respiratory:  Diminished breath sounds at the bases  Abdomen: Soft, nontender, nondistended, bowel sounds present  Musculoskeletal: No bilateral pedal edema noted  Skin: Normal  Psychiatry:  Normal mood   Data Reviewed: CBC: Recent Labs  Lab 11/04/19 2146 11/05/19 0112 11/06/19 0228  WBC 11.7* 8.9 9.0  NEUTROABS 7.6  --  5.9  HGB 10.3* 10.4* 10.5*  HCT 33.7* 33.2* 33.4*  MCV 92.8 93.5 91.3  PLT 407* 373 174   Basic Metabolic Panel: Recent Labs  Lab 11/04/19 2146 11/06/19 0228  NA 133* 136  K 4.6 4.2  CL 105 104  CO2 22 20*  GLUCOSE 100* 109*  BUN 18 19  CREATININE 1.18* 1.21*   CALCIUM 11.0* 10.8*   GFR: Estimated Creatinine Clearance: 28.6 mL/min (A) (by C-G formula based on SCr of 1.21 mg/dL (H)). Liver Function Tests: No results for input(s): AST, ALT, ALKPHOS, BILITOT, PROT, ALBUMIN in the last 168 hours. No results for input(s): LIPASE, AMYLASE in the last 168 hours. No results for input(s): AMMONIA in the last 168 hours. Coagulation Profile: No results for input(s): INR, PROTIME in the last 168 hours. Cardiac Enzymes: No results for input(s): CKTOTAL, CKMB, CKMBINDEX, TROPONINI in the last 168 hours. BNP (last 3 results) No results for input(s): PROBNP in the last 8760 hours. HbA1C: No results for input(s): HGBA1C in the last 72 hours. CBG: Recent Labs  Lab 11/05/19 0101 11/05/19 0747 11/05/19 1235 11/05/19 2136 11/06/19 1128  GLUCAP 89 75 131* 108* 114*   Lipid Profile: No results for input(s): CHOL, HDL, LDLCALC, TRIG, CHOLHDL, LDLDIRECT in the last 72 hours. Thyroid Function Tests: No results for input(s): TSH, T4TOTAL, FREET4, T3FREE, THYROIDAB in the last 72 hours. Anemia Panel: No results for input(s): VITAMINB12, FOLATE, FERRITIN, TIBC, IRON, RETICCTPCT in the last 72 hours. Urine analysis:  Component Value Date/Time   COLORURINE YELLOW 03/10/2019 1749   APPEARANCEUR HAZY (A) 03/10/2019 1749   LABSPEC 1.008 03/10/2019 1749   PHURINE 6.0 03/10/2019 1749   GLUCOSEU NEGATIVE 03/10/2019 1749   HGBUR SMALL (A) 03/10/2019 1749   BILIRUBINUR NEGATIVE 03/10/2019 1749   KETONESUR NEGATIVE 03/10/2019 1749   PROTEINUR NEGATIVE 03/10/2019 1749   UROBILINOGEN 0.2 05/01/2012 1913   NITRITE NEGATIVE 03/10/2019 1749   LEUKOCYTESUR LARGE (A) 03/10/2019 1749   Sepsis Labs: @LABRCNTIP (procalcitonin:4,lacticidven:4)  ) Recent Results (from the past 240 hour(s))  SARS CORONAVIRUS 2 (TAT 6-24 HRS) Nasopharyngeal Nasopharyngeal Swab     Status: None   Collection Time: 11/05/19 12:01 AM   Specimen: Nasopharyngeal Swab  Result Value Ref Range  Status   SARS Coronavirus 2 NEGATIVE NEGATIVE Final    Comment: (NOTE) SARS-CoV-2 target nucleic acids are NOT DETECTED. The SARS-CoV-2 RNA is generally detectable in upper and lower respiratory specimens during the acute phase of infection. Negative results do not preclude SARS-CoV-2 infection, do not rule out co-infections with other pathogens, and should not be used as the sole basis for treatment or other patient management decisions. Negative results must be combined with clinical observations, patient history, and epidemiological information. The expected result is Negative. Fact Sheet for Patients: SugarRoll.be Fact Sheet for Healthcare Providers: https://www.woods-mathews.com/ This test is not yet approved or cleared by the [place] FDA and  has been authorized for detection and/or diagnosis of SARS-CoV-2 by FDA under an Emergency Use Authorization (EUA). This EUA will remain  in effect (meaning this test can be used) for the duration of the COVID-19 declaration under Section 56 4(b)(1) of the Act, 21 U.S.C. section 360bbb-3(b)(1), unless the authorization is terminated or revoked sooner. Performed at [facility], [place] [address], [place], [place] [address]       Studies: DG Chest Port 1 View  Result Date: 11/06/2019 CLINICAL DATA:  Shortness of breath. EXAM: PORTABLE CHEST 1 VIEW COMPARISON:  11/04/2019 FINDINGS: The cardiac silhouette, mediastinal and hilar contours are stable. Coronary artery stents are noted. Persistent diffuse asymmetric interstitial and airspace process in the lungs with diffuse ground-glass type opacities suspicious for atypical/viral pneumonia. No definite pleural effusions. IMPRESSION: Persistent interstitial and airspace process suspicious for atypical/viral pneumonia. Electronically Signed   By: Marijo Sanes M.D.   On: 11/06/2019 07:49    Scheduled Meds: . amLODipine  2.5 mg Oral Daily  .  apixaban  2.5 mg Oral BID  . clopidogrel  75 mg Oral Daily  . docusate sodium  100 mg Oral Daily  . doxycycline  100 mg Oral Q12H  . feeding supplement (ENSURE ENLIVE)  237 mL Oral BID BM  . [START ON 11/07/2019] furosemide  20 mg Oral Daily  . gabapentin  300 mg Oral BID  . insulin aspart  0-5 Units Subcutaneous QHS  . insulin aspart  0-9 Units Subcutaneous TID WC  . levothyroxine  75 mcg Oral Daily  . pantoprazole  40 mg Oral Daily  . prenatal multivitamin  1 tablet Oral Q1200  . sodium chloride flush  3 mL Intravenous Q12H  . valACYclovir  500 mg Oral BID    Continuous Infusions: . sodium chloride    . cefTRIAXone (ROCEPHIN)  IV 1 g (11/05/19 2145)     LOS: 1 day     Kristine Friendly, MD [facility]  If 7PM-7AM, please contact night-coverage [email or web address] 11/06/2019, 5:36 PM

## 2019-11-06 NOTE — Consult Note (Signed)
Consultation Note Date: 11/06/2019   Patient Name: Kristine Oliver  DOB: 25-Jul-1931  MRN: [id number]  Age / Sex: 86 y.o., female  PCP: Lavone Orn, MD Referring Physician: Alma Friendly, MD  Reason for Consultation: Establishing goals of care and Psychosocial/spiritual support  HPI/Patient Profile: 86 y.o. female  admitted on 11/04/2019 with past medical history significant for coronary artery disease/status post PCI, just of heart failure, persistent A. fib on Eliquis, chronic kidney disease, diabetes mellitus, hypertension, hyperlipidemia, hypothyroidism, IBS, bipolar disorder.    Admitted through ED via EMS for evaluation of shortness of breath, patient reports 1 week of dyspnea at rest.  States she normally takes Lasix as needed but has not felt that she needed it this past week. Reported that she had episodes of passing out over the past week.  Evaluated by psychiatry yesterday, regarding concerns for suicidality, currently sitter at bedside.  Per psychiatry note, patient's behavior most likely and simply dramatic and prone to histrionics possibly worsened by mild cognitive impairment.  This evaluation is supported "100%" by daughter-in-law/Tammy Wynetta Emery.    Patient lives at home with a caregiver Brynda Greathouse.  Both her son/H POA and daughter-in-law are main support persons.   Patient admitted for further management.  Clinical Assessment and Goals of Care:   This NP Wadie Lessen reviewed medical records, received report from team, assessed the patient and then meet at the patient's bedside and spoke by phone with her daughter-in-law Adella Nissen and caregiver Brynda Greathouse to discuss diagnosis, prognosis, GOC, EOL wishes disposition and options.  Concept of   Palliative Care were discussed  Created space and opportunity for patient to explore her thoughts and feelings regarding her current  medical situation.  Currently she denies any suicidal thoughts, plans or intent.  Patient is very engaging, sitting up in bed, speaking to the fact that some of her medical management i.e,  Lasix is hard for her to tolerate, " it makes me have to pee"  In conversation with daughter-in-law and she tells me that she is the main caregiver and support person for this patient.  Both the patient and the caregiver have chronic pain issues and associated psychosocial issues.  Daughter-in-law controls dispensing of medication at home.  Reportedly patient was driving 2 weeks ago.  A  discussion was had today regarding advanced directives.  Concepts specific to code status, artifical feeding and hydration, continued IV antibiotics and rehospitalization was had.  The difference between a aggressive medical intervention path  and a palliative comfort care path for this patient at this time was had.  Values and goals of care important to patient and family were attempted to be elicited.   Questions and concerns addressed.   Family encouraged to call with questions or concerns.    PMT will continue to support holistically.      HCPOA/ son Traci Sermon    SUMMARY OF RECOMMENDATIONS    Today patient tells me she is open and hopeful for medical management for her chronic diseases.  She  agrees to prescribed medications.  Patient and family hope that once medically discharged to return home to previous living situation.  Recommend outpatient community-based palliative services to follow  Code Status/Advance Care Planning:  DNR  Symptom Management:   Chronic pain: Oxycodone IR 10 mg every 6 hours p.o. as needed  Palliative Prophylaxis:   Bowel Regimen, Delirium Protocol and Frequent Pain Assessment  Additional Recommendations (Limitations, Scope, Preferences):  Full Scope Treatment  Psycho-social/Spiritual:    Psychiatry consulted and following  Prognosis:   Unable to  determine  Discharge Planning:  Recommend outpatient community-based palliative on discharge  Home with Home Health      Primary Diagnoses: Present on Admission: . Acute respiratory failure ([place]) . CAD (coronary artery disease) . Essential hypertension   I have reviewed the medical record, interviewed the patient and family, and examined the patient. The following aspects are pertinent.  Past Medical History:  Diagnosis Date  . Anxiety   . Bradycardia   . CAD (coronary artery disease)    a. 08/2004 NSTEMI/PCI: LAD 27m/d (2.75 x 28 mm Taxus 2 DES);  b. 10/2010 Cath: LM nl, LAD patent stents w/ jailed septal, LCX 30, RCA 30; c. 06/2013 MV: No ischemia. Small fixed anteroseptal defect, ? scar vs atten. EF 62%; d. 03/2016 MV: EF 57%, Low risk.  . Chronic diastolic heart failure ([place])    a. Echo 7/13:  mod LVH, EF 65-70%, vigorous LV, Gr 1 diast dysfn, mild LAE; b. 05/2017 Echo: EF 60-65%, mod LVH, no rwma, Gr1 DD, mildly dil LA.  Marland Kitchen Chronic pain disorder   . CKD (chronic kidney disease), stage III   . DDD (degenerative disc disease), lumbosacral    , Spinal stenosis  . Diabetes mellitus ([place])    , Type II  . GERD (gastroesophageal reflux disease)   . HTN (hypertension)   . Hyperlipidemia   . Hypothyroidism   . IBS (irritable bowel syndrome)   . Lumbar disc disease   . Nonproliferative diabetic retinopathy associated with type 2 diabetes mellitus ([place])   . Obesity (BMI 30-39.9)   . Peripheral neuropathy    , Associated with DM  . Scoliosis    Social History   Socioeconomic History  . Marital status: Divorced    Spouse name: Not on file  . Number of children: Not on file  . Years of education: Not on file  . Highest education level: Not on file  Occupational History  . Occupation: retired  Tobacco Use  . Smoking status: Former Smoker    Years: 35.00  . Smokeless tobacco: Never Used  Substance and Sexual Activity  . Alcohol use: No    Comment: social drinking, not  often  . Drug use: No  . Sexual activity: Not on file  Other Topics Concern  . Not on file  Social History Narrative   Divorced.  Lives with her nephew in [place].  She walks most days of the week.   Social Determinants of Health   Financial Resource Strain:   . Difficulty of Paying Living Expenses:   Food Insecurity:   . Worried About Charity fundraiser in the Last Year:   . Arboriculturist in the Last Year:   Transportation Needs:   . Film/video editor (Medical):   Marland Kitchen Lack of Transportation (Non-Medical):   Physical Activity:   . Days of Exercise per Week:   . Minutes of Exercise per Session:   Stress:   . Feeling of Stress :  Social Connections:   . Frequency of Communication with Friends and Family:   . Frequency of Social Gatherings with Friends and Family:   . Attends Religious Services:   . Active Member of Clubs or Organizations:   . Attends Archivist Meetings:   Marland Kitchen Marital Status:    Family History  Problem Relation Age of Onset  . Heart failure Father   . Diabetes Father   . Coronary artery disease Mother    Scheduled Meds: . amLODipine  2.5 mg Oral Daily  . apixaban  2.5 mg Oral BID  . clopidogrel  75 mg Oral Daily  . docusate sodium  100 mg Oral Daily  . doxycycline  100 mg Oral Q12H  . feeding supplement (ENSURE ENLIVE)  237 mL Oral BID BM  . furosemide  40 mg Intravenous Daily  . gabapentin  300 mg Oral BID  . insulin aspart  0-5 Units Subcutaneous QHS  . insulin aspart  0-9 Units Subcutaneous TID WC  . levothyroxine  75 mcg Oral Daily  . pantoprazole  40 mg Oral Daily  . prenatal multivitamin  1 tablet Oral Q1200  . sodium chloride flush  3 mL Intravenous Q12H  . valACYclovir  500 mg Oral BID   Continuous Infusions: . sodium chloride    . cefTRIAXone (ROCEPHIN)  IV 1 g (11/05/19 2145)   PRN Meds:.sodium chloride, acetaminophen, cycloSPORINE, oxyCODONE, sodium chloride flush Medications Prior to Admission:  Prior to  Admission medications   Medication Sig Start Date End Date Taking? Authorizing Provider  albuterol (VENTOLIN HFA) 108 (90 Base) MCG/ACT inhaler Inhale 1 puff into the lungs every 4 (four) hours as needed for wheezing or shortness of breath.  09/25/19  Yes [provider]  amLODipine (NORVASC) 2.5 MG tablet Take 1 tablet (2.5 mg total) by mouth daily. 11/01/18  Yes Jerline Pain, MD  apixaban (ELIQUIS) 2.5 MG TABS tablet Take 1 tablet (2.5 mg total) by mouth 2 (two) times daily. 10/31/18  Yes Duke, Tami Lin, PA  ASPERCREME LIDOCAINE EX Apply 1 application topically 2 (two) times daily as needed (back/feet pain).   Yes [provider]  clindamycin (CLEOCIN T) 1 % lotion Apply 1 application topically 2 (two) times daily. Facial wash 11/16/13  Yes [provider]  clopidogrel (PLAVIX) 75 MG tablet Take 1 tablet (75 mg total) by mouth daily. 03/23/18  Yes Kroeger, Daleen Snook M., PA-C  cycloSPORINE (RESTASIS) 0.05 % ophthalmic emulsion Place 1 drop into both eyes 2 (two) times daily as needed (dry eyes).    Yes [provider]  docusate sodium (COLACE) 100 MG capsule Take 100 mg by mouth daily.   Yes [provider]  furosemide (LASIX) 20 MG tablet Take 1 tablet by mouth daily, may take a extra tablet daily only as needed if wt is up to 171 Patient taking differently: Take 20 mg by mouth as needed (weight gain of >3 lbs).  12/01/18  Yes Isaiah Serge, NP  gabapentin (NEURONTIN) 300 MG capsule Take 300 mg by mouth 2 (two) times daily.    Yes [provider]  isosorbide mononitrate (IMDUR) 30 MG 24 hr tablet Take 1 tablet (30 mg total) by mouth daily. 07/06/19 10/08/28 Yes Jerline Pain, MD  levothyroxine (SYNTHROID, LEVOTHROID) 75 MCG tablet Take 75 mcg by mouth daily.   Yes [provider]  Melatonin 3 MG TABS Take 3 mg by mouth at bedtime as needed (sleep).   Yes [provider]  metFORMIN (  GLUCOPHAGE) 500 MG tablet Take 500 mg by mouth 2  (two) times daily with a meal.  03/17/16  Yes [provider]  nitroGLYCERIN (NITROSTAT) 0.4 MG SL tablet DISSOLVE 1 TABLET UNDER THE TONGUE EVERY 5 MINUTES FOR 3 DOSES AS NEEDED FOR CHEST PAIN Patient taking differently: Place 0.4 mg under the tongue every 5 (five) minutes x 3 doses as needed for chest pain.  01/23/19  Yes Reino Bellis B, NP  oxyCODONE (OXY IR/ROXICODONE) 5 MG immediate release tablet Take 5 mg by mouth at bedtime. 10/19/18  Yes [provider]  Oxycodone HCl 10 MG TABS Take 1 tablet (10 mg total) by mouth 4 (four) times daily. 03/10/19  Yes Drenda Freeze, MD  pantoprazole (PROTONIX) 40 MG tablet TAKE 1 TABLET(40 MG) BY MOUTH DAILY Patient taking differently: Take 40 mg by mouth daily.  03/27/19  Yes Jerline Pain, MD  ramipril (ALTACE) 5 MG capsule TAKE 1 CAPSULE(5 MG) BY MOUTH DAILY Patient taking differently: Take 5 mg by mouth daily.  07/19/18  Yes Kroeger, Daleen Snook M., PA-C  rosuvastatin (CRESTOR) 20 MG tablet TAKE 1 TABLET(20 MG) BY MOUTH DAILY AT 6 PM Patient taking differently: Take 20 mg by mouth every evening.  05/26/19  Yes Kroeger, Daleen Snook M., PA-C  valACYclovir (VALTREX) 500 MG tablet Take 500 mg by mouth 2 (two) times daily.    Yes [provider]  vitamin B-12 (CYANOCOBALAMIN) 500 MCG tablet Take 500 mcg by mouth daily.   Yes [provider]  sodium chloride (OCEAN) 0.65 % SOLN nasal spray Place 1 spray into both nostrils as needed for congestion (dryness).    [provider]   Allergies  Allergen Reactions  . Pravastatin Other (See Comments)    Muscle aches  . Prednisone Nausea And Vomiting   Review of Systems    Physical Exam Cardiovascular:     Rate and Rhythm: Normal rate.  Skin:    General: Skin is warm and dry.  Neurological:     Mental Status: She is alert.  Psychiatric:        Attention and Perception: Attention normal.        Mood and Affect: Mood normal.        Speech: Speech normal.      Vital Signs: BP 123/65 (BP Location: Right Arm)   Pulse 70   Temp (!) 97.5 F (36.4 C)   Resp 16   Ht 4\' 11"  (1.499 m)   Wt 70.9 kg   SpO2 97%   BMI 31.57 kg/m  Pain Scale: 0-10   Pain Score: Asleep   SpO2: SpO2: 97 % O2 Device:SpO2: 97 % O2 Flow Rate: .O2 Flow Rate (L/min): 2 L/min  IO: Intake/output summary:   Intake/Output Summary (Last 24 hours) at 11/06/2019 1000 Last data filed at 11/06/2019 0730 Gross per 24 hour  Intake 700 ml  Output 850 ml  Net -150 ml    LBM: Last BM Date: 11/03/19 Baseline Weight: Weight: 70.8 kg Most recent weight: Weight: 70.9 kg     Palliative Assessment/Data:   Discussed with Dr Horris Latino  Time In: 1130 Time Out: 1245 Time Total: 75 minutes Greater than 50%  of this time was spent counseling and coordinating care related to the above assessment and plan.  Signed by: Wadie Lessen, NP   Please contact Palliative Medicine Team phone at [phone] for questions and concerns.  For individual provider: See Shea Evans

## 2019-11-06 NOTE — Evaluation (Signed)
Physical Therapy Evaluation Patient Details Name: Kristine Oliver MRN: [id number] DOB: 28-Sep-1930 Today's Date: 11/06/2019   History of Present Illness  Pt is an 86 year old woman admitted from home with SOB and recent falls. PMH: CAD, COPD, CHF, CKD III-IV, DM, HTN, IBS, obesity, peripheral neuropathy, DDD, retinopathy, afib.  Clinical Impression  Patient received sitting edge of bed, sitter present in room. Patient pleasant, alert, agreeable to PT evaluation. Patient seems very depressed. Tearful during session. States she just wants to be comfortable, she is tired of hurting, and doesn't want to go on. Just want to go to heaven. Patient was able to stand with min guard. Ambulated 15 feet with RW and min guard. She focuses on her limitations throughout session.  Patient will continue to benefit from skilled PT, however she does not want PT. She has done it before and does not think it will help her. Therefore. PT signing off at this time per her wishes.        Follow Up Recommendations  unsure/hospice??    Equipment Recommendations  None recommended by PT    Recommendations for Other Services       Precautions / Restrictions Precautions Precautions: Fall Restrictions Weight Bearing Restrictions: No      Mobility  Bed Mobility               General bed mobility comments: patient received sitting up on side of bed eating breakfast  Transfers Overall transfer level: Needs assistance Equipment used: Rolling walker (2 wheeled) Transfers: Sit to/from Stand Sit to Stand: Min guard            Ambulation/Gait Ambulation/Gait assistance: Min guard Gait Distance (Feet): 15 Feet Assistive device: Rolling walker (2 wheeled) Gait Pattern/deviations: Step-to pattern;Trunk flexed;Decreased step length - right;Decreased step length - left Gait velocity: decreased   General Gait Details: patient was unhappy about using RW, states she uses rollator. Required encouragement to  ambulate this distance  Stairs            Wheelchair Mobility    Modified Rankin (Stroke Patients Only)       Balance Overall balance assessment: Modified Independent;Needs assistance Sitting-balance support: Feet supported Sitting balance-Leahy Scale: Fair     Standing balance support: Bilateral upper extremity supported;During functional activity Standing balance-Leahy Scale: Poor Standing balance comment: reliant on walker                             Pertinent Vitals/Pain Pain Assessment: Faces Faces Pain Scale: Hurts little more Pain Location: L heel Pain Descriptors / Indicators: Grimacing;Guarding;Moaning Pain Intervention(s): Monitored during session;Repositioned    Home Living Family/patient expects to be discharged to:: Private residence Living Arrangements: Non-relatives/Friends Available Help at Discharge: Other (Comment)(patient states her roomate is unable to assist her much) Type of Home: Other(Comment)(condo) Home Access: Stairs to enter   Entrance Stairs-Number of Steps: 1 Home Layout: One level Home Equipment: Shower seat;Grab bars - tub/shower;Hand held shower head;Walker - 4 wheels      Prior Function Level of Independence: Independent with assistive device(s)         Comments: prepares simple meals, has a housekeeper once a month to clean     Hand Dominance   Dominant Hand: Right    Extremity/Trunk Assessment   Upper Extremity Assessment Upper Extremity Assessment: Generalized weakness    Lower Extremity Assessment Lower Extremity Assessment: Generalized weakness    Cervical / Trunk Assessment Cervical /  Trunk Assessment: Kyphotic Cervical / Trunk Exceptions: reports she has a back brace, not present for session  Communication   Communication: No difficulties  Cognition Arousal/Alertness: Awake/alert Behavior During Therapy: (depressed, crying, states she just wants to be comfortable and does not want to do all  this, she is tired.) Overall Cognitive Status: Within Functional Limits for tasks assessed                                 General Comments: patient perseverates on her inabilities.      General Comments      Exercises     Assessment/Plan    PT Assessment Patent does not need any further PT services  PT Problem List Decreased strength;Decreased activity tolerance;Decreased mobility       PT Treatment Interventions      PT Goals (Current goals can be found in the Care Plan section)  Acute Rehab PT Goals Patient Stated Goal: to just be comfortable, to go be in heaven PT Goal Formulation: With patient Time For Goal Achievement: 11/13/19 Potential to Achieve Goals: Fair    Frequency     Barriers to discharge Decreased caregiver support      Co-evaluation PT/OT/SLP Co-Evaluation/Treatment: Yes Reason for Co-Treatment: For patient/therapist safety;To address functional/ADL transfers;Necessary to address cognition/behavior during functional activity PT goals addressed during session: Mobility/safety with mobility;Balance         AM-PAC PT "6 Clicks" Mobility  Outcome Measure Help needed turning from your back to your side while in a flat bed without using bedrails?: A Little Help needed moving from lying on your back to sitting on the side of a flat bed without using bedrails?: A Little Help needed moving to and from a bed to a chair (including a wheelchair)?: A Little Help needed standing up from a chair using your arms (e.g., wheelchair or bedside chair)?: A Little Help needed to walk in hospital room?: A Little Help needed climbing 3-5 steps with a railing? : A Lot 6 Click Score: 17    End of Session Equipment Utilized During Treatment: Gait belt Activity Tolerance: Patient limited by fatigue Patient left: in bed;with nursing/sitter in room Nurse Communication: Mobility status PT Visit Diagnosis: Difficulty in walking, not elsewhere classified  (R26.2);Muscle weakness (generalized) (M62.81);History of falling (Z91.81)    Time: 5638-7564 PT Time Calculation (min) (ACUTE ONLY): 25 min   Charges:   PT Evaluation $PT Eval Moderate Complexity: 1 Mod PT Treatments $Gait Training: 8-22 mins        Savera Donson, PT, GCS 11/06/19,12:39 PM

## 2019-11-06 NOTE — Evaluation (Addendum)
Occupational Therapy Evaluation and Discharge Patient Details Name: Kristine Oliver MRN: [id number] DOB: 1930/11/08 Today's Date: 11/06/2019    History of Present Illness Pt is an 86 year old woman admitted from home with SOB and recent falls. PMH: CAD, COPD, CHF, CKD III-IV, DM, HTN, IBS, obesity, peripheral neuropathy, DDD, retinopathy, afib.   Clinical Impression   Pt report struggling to care for herself in her home, tearful at times and repeating she is tired and "just wants to die." Pt requires set up to max assist for ADL and ambulates short distances with RW and min guard assist. Sp02 down to 88% on RA, replaced 2L 02. Pt is declining further therapy stating she just wanted to talk to palliative care about a plan. Signing off.    Follow Up Recommendations  No OT follow up(pt declines further therapy)    Equipment Recommendations  None recommended by OT    Recommendations for Other Services       Precautions / Restrictions Precautions Precautions: Fall Restrictions Weight Bearing Restrictions: No      Mobility Bed Mobility               General bed mobility comments: pt seated at EOB upon arrival, returned to EOB  Transfers Overall transfer level: Needs assistance Equipment used: Rolling walker (2 wheeled) Transfers: Sit to/from Stand Sit to Stand: Min guard              Balance Overall balance assessment: Needs assistance   Sitting balance-Leahy Scale: Fair     Standing balance support: Bilateral upper extremity supported Standing balance-Leahy Scale: Poor Standing balance comment: reliant on walker                           ADL either performed or assessed with clinical judgement   ADL Overall ADL's : Needs assistance/impaired Eating/Feeding: Independent   Grooming: Set up;Sitting   Upper Body Bathing: Minimal assistance;Sitting   Lower Body Bathing: Maximal assistance;Sit to/from stand   Upper Body Dressing : Minimal  assistance;Sitting   Lower Body Dressing: Maximal assistance;Sit to/from stand   Toilet Transfer: Min guard;Ambulation;RW           Functional mobility during ADLs: Min guard;Rolling walker       Vision Patient Visual Report: No change from baseline       Perception     Praxis      Pertinent Vitals/Pain Faces Pain Scale: Hurts little more Pain Location: L heel Pain Descriptors / Indicators: Grimacing;Guarding     Hand Dominance Right   Extremity/Trunk Assessment         Cervical / Trunk Assessment Cervical / Trunk Assessment: Other exceptions Cervical / Trunk Exceptions: reports she has a back brace, not present for session   Communication Communication Communication: No difficulties   Cognition Arousal/Alertness: Awake/alert Behavior During Therapy: (depressed demeanor) Overall Cognitive Status: Within Functional Limits for tasks assessed                                 General Comments: pt ruminating on her small legs, weakness and inability to manage at home   General Comments       Exercises     Shoulder Instructions      Home Living Family/patient expects to be discharged to:: Private residence Living Arrangements: Non-relatives/Friends(roommate) Available Help at Discharge: Other (Comment)(roommate is of little help) Type of Home:  Other(Comment)(condo) Home Access: Stairs to enter Entrance Stairs-Number of Steps: 1   Home Layout: One level     Bathroom Shower/Tub: Teacher, early years/pre: Standard     Home Equipment: Shower seat;Grab bars - tub/shower;Hand held Tourist information centre manager - 4 wheels          Prior Functioning/Environment Level of Independence: Independent with assistive device(s)        Comments: prepares simple meals, has a housekeeper once a month to clean        OT Problem List: Decreased strength      OT Treatment/Interventions:      OT Goals(Current goals can be found in the care  plan section) Acute Rehab OT Goals Patient Stated Goal: to die  OT Frequency:     Barriers to D/C:            Co-evaluation              AM-PAC OT "6 Clicks" Daily Activity     Outcome Measure Help from another person eating meals?: None Help from another person taking care of personal grooming?: A Little Help from another person toileting, which includes using toliet, bedpan, or urinal?: A Little Help from another person bathing (including washing, rinsing, drying)?: A Lot Help from another person to put on and taking off regular upper body clothing?: A Little Help from another person to put on and taking off regular lower body clothing?: A Lot 6 Click Score: 17   End of Session    Activity Tolerance: Patient tolerated treatment well Patient left: in bed;with call bell/phone within reach;with nursing/sitter in room  OT Visit Diagnosis: Other abnormalities of gait and mobility (R26.89);Pain;Muscle weakness (generalized) (M62.81)                Time: 4967-5916 OT Time Calculation (min): 25 min Charges:  OT General Charges $OT Visit: 1 Visit OT Evaluation $OT Eval Moderate Complexity: 1 Mod  Nestor Lewandowsky, OTR/L Acute Rehabilitation Services Pager: [phone] Office: [phone] Malka So 11/06/2019, 12:05 PM

## 2019-11-07 DIAGNOSIS — R531 Weakness: Secondary | ICD-10-CM

## 2019-11-07 LAB — BASIC METABOLIC PANEL
Anion gap: 8 (ref 5–15)
BUN: 20 mg/dL (ref 8–23)
CO2: 22 mmol/L (ref 22–32)
Calcium: 11.3 mg/dL — ABNORMAL HIGH (ref 8.9–10.3)
Chloride: 108 mmol/L (ref 98–111)
Creatinine, Ser: 1.15 mg/dL — ABNORMAL HIGH (ref 0.44–1.00)
GFR calc Af Amer: 50 mL/min — ABNORMAL LOW (ref >60)
GFR calc non Af Amer: 43 mL/min — ABNORMAL LOW (ref >60)
Glucose, Bld: 113 mg/dL — ABNORMAL HIGH (ref 70–99)
Potassium: 4.4 mmol/L (ref 3.5–5.1)
Sodium: 138 mmol/L (ref 135–145)

## 2019-11-07 LAB — GLUCOSE, CAPILLARY
Glucose-Capillary: 85 mg/dL (ref 70–99)
Glucose-Capillary: 117 mg/dL — ABNORMAL HIGH (ref 70–99)

## 2019-11-07 LAB — CBC WITH DIFFERENTIAL/PLATELET
Abs Immature Granulocytes: 0.07 10*3/uL (ref 0.00–0.07)
Basophils Absolute: 0.1 10*3/uL (ref 0.0–0.1)
Basophils Relative: 1 %
Eosinophils Absolute: 0.7 10*3/uL — ABNORMAL HIGH (ref 0.0–0.5)
Eosinophils Relative: 9 %
HCT: 34.9 % — ABNORMAL LOW (ref 36.0–46.0)
Hemoglobin: 10.9 g/dL — ABNORMAL LOW (ref 12.0–15.0)
Immature Granulocytes: 1 %
Lymphocytes Relative: 20 %
Lymphs Abs: 1.7 10*3/uL (ref 0.7–4.0)
MCH: 28.5 pg (ref 26.0–34.0)
MCHC: 31.2 g/dL (ref 30.0–36.0)
MCV: 91.1 fL (ref 80.0–100.0)
Monocytes Absolute: 0.7 10*3/uL (ref 0.1–1.0)
Monocytes Relative: 8 %
Neutro Abs: 5.2 10*3/uL (ref 1.7–7.7)
Neutrophils Relative %: 61 %
Platelets: 388 10*3/uL (ref 150–400)
RBC: 3.83 MIL/uL — ABNORMAL LOW (ref 3.87–5.11)
RDW: 17.9 % — ABNORMAL HIGH (ref 11.5–15.5)
WBC: 8.3 10*3/uL (ref 4.0–10.5)
nRBC: 0 % (ref 0.0–0.2)

## 2019-11-07 MED ORDER — GUAIFENESIN-DM 100-10 MG/5ML PO SYRP
15.0000 mL | ORAL_SOLUTION | ORAL | Status: DC | PRN
  Administered 2019-11-07 – 2019-11-08 (×2): 15 mL via ORAL
  Filled 2019-11-07 (×2): qty 15

## 2019-11-07 NOTE — Plan of Care (Signed)
  Problem: Education: Goal: Knowledge of General Education information will improve Description: Including pain rating scale, medication(s)/side effects and non-pharmacologic comfort measures 11/07/2019 2006 by Georges Lynch, RN Outcome: Progressing 11/07/2019 2006 by Georges Lynch, RN Outcome: Progressing   Problem: Health Behavior/Discharge Planning: Goal: Ability to manage health-related needs will improve 11/07/2019 2006 by Georges Lynch, RN Outcome: Progressing 11/07/2019 2006 by Georges Lynch, RN Outcome: Progressing   Problem: Clinical Measurements: Goal: Ability to maintain clinical measurements within normal limits will improve 11/07/2019 2006 by Georges Lynch, RN Outcome: Progressing 11/07/2019 2006 by Georges Lynch, RN Outcome: Progressing Goal: Will remain free from infection 11/07/2019 2006 by Georges Lynch, RN Outcome: Progressing 11/07/2019 2006 by Georges Lynch, RN Outcome: Progressing Goal: Diagnostic test results will improve 11/07/2019 2006 by Georges Lynch, RN Outcome: Progressing 11/07/2019 2006 by Georges Lynch, RN Outcome: Progressing Goal: Respiratory complications will improve 11/07/2019 2006 by Georges Lynch, RN Outcome: Progressing 11/07/2019 2006 by Georges Lynch, RN Outcome: Progressing Goal: Cardiovascular complication will be avoided 11/07/2019 2006 by Georges Lynch, RN Outcome: Progressing 11/07/2019 2006 by Georges Lynch, RN Outcome: Progressing   Problem: Activity: Goal: Risk for activity intolerance will decrease 11/07/2019 2006 by Georges Lynch, RN Outcome: Progressing 11/07/2019 2006 by Georges Lynch, RN Outcome: Progressing   Problem: Nutrition: Goal: Adequate nutrition will be maintained 11/07/2019 2006 by Georges Lynch, RN Outcome: Progressing 11/07/2019 2006 by Georges Lynch, RN Outcome: Progressing   Problem: Coping: Goal: Level of anxiety will  decrease 11/07/2019 2006 by Georges Lynch, RN Outcome: Progressing 11/07/2019 2006 by Georges Lynch, RN Outcome: Progressing   Problem: Elimination: Goal: Will not experience complications related to bowel motility 11/07/2019 2006 by Georges Lynch, RN Outcome: Progressing 11/07/2019 2006 by Georges Lynch, RN Outcome: Progressing Goal: Will not experience complications related to urinary retention 11/07/2019 2006 by Georges Lynch, RN Outcome: Progressing 11/07/2019 2006 by Georges Lynch, RN Outcome: Progressing   Problem: Pain Managment: Goal: General experience of comfort will improve 11/07/2019 2006 by Georges Lynch, RN Outcome: Progressing 11/07/2019 2006 by Georges Lynch, RN Outcome: Progressing   Problem: Safety: Goal: Ability to remain free from injury will improve 11/07/2019 2006 by Georges Lynch, RN Outcome: Progressing 11/07/2019 2006 by Georges Lynch, RN Outcome: Progressing   Problem: Skin Integrity: Goal: Risk for impaired skin integrity will decrease 11/07/2019 2006 by Georges Lynch, RN Outcome: Progressing 11/07/2019 2006 by Georges Lynch, RN Outcome: Progressing

## 2019-11-07 NOTE — Progress Notes (Signed)
PROGRESS NOTE  Kristine Oliver RSW:[id number] DOB: 06-17-1931 DOA: 11/04/2019 PCP: Lavone Orn, MD  HPI/Recap of past 24 hours: HPI from Dr Damita Dunnings Kristine Oliver is a 86 y.o. female with medical history significant of CAD status post PCI, chronic diastolic congestive heart failure, persistent A. fib on Eliquis, CKD stage III-IV, type 2 diabetes, hypertension, hyperlipidemia, hypothyroidism, IBS, obesity (BMI 31.51) presenting to the ED via EMS for evaluation of shortness of breath.  EMS reported oxygen saturation in the mid to upper 80s while patient ambulated in her house.  Sats improved to 99% with 2 L supplemental oxygen.  Patient reports 1 week history of dyspnea even at rest.  Denies chest pain.  States she has postnasal drip and has been coughing a lot.  Reports eating a lot of potato chips and dip a week ago.  States she normally takes Lasix as needed but has not felt that she needed it this past week.  States she has passed out twice over this past week.  One time she woke up next to the commode and another time she passed out in her chair.  Reports bruising of her right forearm but denies any pain or injuries from the falls. Pt has already received both of her Covid vaccines, second dose was several weeks ago. In the ED, WBC count mildly elevated at 11.7.  Hemoglobin 10.3, no significant change from baseline.  Creatinine 1.1, improved compared to prior labs.  BNP 320.  High-sensitivity troponin negative.  EKG without acute ischemic changes. Chest x-ray showing diffuse interstitial infiltrates unchanged, concerning for atypical infection versus edema versus chronic interstitial lung disease. Patient admitted for further management.       Today, patient denies any new complaints.  Refused to participate with PT/OT.  Patient and family requesting SNF placement.  Patient continues to exhibit histrionic behavior/attention seeking.     Assessment/Plan: Principal Problem:   Acute respiratory  failure (HCC) Active Problems:   CAD (coronary artery disease)   Essential hypertension   Acute on chronic diastolic CHF (congestive heart failure) (HCC)   Syncope   Adjustment disorder with depressed mood   Palliative care by specialist   DNR (do not resuscitate)   Acute hypoxic respiratory failure likely 2/2 acute on chronic diastolic HF/CAP Currently afebrile, with no leukocytosis Currently on 2 L via nasal cannula saturating above 95%, wean off BNP 320, troponin negative COVID-19 virus negative, procalcitonin negative Chest x-ray showed diffuse interstitial infiltrates concerning for atypical infection, edema, chronic interstitial lung disease D-dimer mildly elevated, CTA chest showed no PE, diffuse bilateral groundglass airspace opacity suspicious for developing pulmonary edema versus multifocal pneumonia Echo with EF of 65 to 70%, no regional wall motion abnormality, unable to evaluate diastolic function due to persistent A. fib Continue doxycycline, azithromycin Continue diuresis with home dose Lasix Strict I's and O's, daily weights  Recurrent falls/?syncope Head CT unremarkable PT/OT-refused  CAD status post stents Currently chest pain-free Continue Plavix  Diabetes mellitus type 2 Last A1c 6.5 SSI, Accu-Cheks, hypoglycemic protocol  Persistent A. Fib Status post cardioversion in 08/2018 Currently in A. fib, rate controlled Continue Eliquis  Hypertension BP soft Hold home amlodipine, imdur, ramipril,  CKD stage III Creatinine currently at baseline Daily BMP  Hypothyroidism Continue Synthroid  Obesity Lifestyle modification advised  Possible depression/goals of care discussion/ Psychiatry and palliative care have been consulted-patient with histrionic behavior, no intent to harm herself, according to family, her usual behavior.  Possible undiagnosed bipolar disorder as per psychiatrist Not  meeting criteria currently for hospice Outpatient palliative  follow-up         Malnutrition Type:      Malnutrition Characteristics:      Nutrition Interventions:       Estimated body mass index is 31.57 kg/m as calculated from the following:   Height as of this encounter: 4\' 11"  (1.499 m).   Weight as of this encounter: 70.9 kg.     Code Status: DNR  Family Communication: Discussed extensively with patient  Disposition Plan: Patient from home, plan on discharging patient to home today, family/patient changed her mind and requesting SNF placement.  Refused PT/OT.  TOC notified   Consultants:  Psychiatry  Palliative care  Procedures:  None  Antimicrobials:  Ceftriaxone  Doxycycline  DVT prophylaxis: Eliquis   Objective: Vitals:   11/06/19 1229 11/06/19 1609 11/07/19 0803 11/07/19 1608  BP:  (!) 108/48 (!) 141/48 101/79  Pulse:  71 69 76  Resp:   20 18  Temp:  (!) 97.5 F (36.4 C) 97.8 F (36.6 C) 97.8 F (36.6 C)  TempSrc:      SpO2: 97% 94% 96% 97%  Weight:      Height:       No intake or output data in the 24 hours ending 11/07/19 1745 Filed Weights   11/04/19 2136 11/05/19 0247 11/06/19 0500  Weight: 70.8 kg 71.2 kg 70.9 kg    Exam:  General: NAD  Cardiovascular: S1, S2 present  Respiratory:  Diminished breath sounds at the bases, with some mild crackles  Abdomen: Soft, nontender, nondistended, bowel sounds present  Musculoskeletal: No bilateral pedal edema noted  Skin: Normal  Psychiatry:  Normal mood   Data Reviewed: CBC: Recent Labs  Lab 11/04/19 2146 11/05/19 0112 11/06/19 0228 11/07/19 0315  WBC 11.7* 8.9 9.0 8.3  NEUTROABS 7.6  --  5.9 5.2  HGB 10.3* 10.4* 10.5* 10.9*  HCT 33.7* 33.2* 33.4* 34.9*  MCV 92.8 93.5 91.3 91.1  PLT 407* 373 365 417   Basic Metabolic Panel: Recent Labs  Lab 11/04/19 2146 11/06/19 0228 11/07/19 0315  NA 133* 136 138  K 4.6 4.2 4.4  CL 105 104 108  CO2 22 20* 22  GLUCOSE 100* 109* 113*  BUN 18 19 20   CREATININE 1.18* 1.21*  1.15*  CALCIUM 11.0* 10.8* 11.3*   GFR: Estimated Creatinine Clearance: 30.1 mL/min (A) (by C-G formula based on SCr of 1.15 mg/dL (H)). Liver Function Tests: No results for input(s): AST, ALT, ALKPHOS, BILITOT, PROT, ALBUMIN in the last 168 hours. No results for input(s): LIPASE, AMYLASE in the last 168 hours. No results for input(s): AMMONIA in the last 168 hours. Coagulation Profile: No results for input(s): INR, PROTIME in the last 168 hours. Cardiac Enzymes: No results for input(s): CKTOTAL, CKMB, CKMBINDEX, TROPONINI in the last 168 hours. BNP (last 3 results) No results for input(s): PROBNP in the last 8760 hours. HbA1C: No results for input(s): HGBA1C in the last 72 hours. CBG: Recent Labs  Lab 11/05/19 0747 11/05/19 1235 11/05/19 2136 11/06/19 1128 11/07/19 0802  GLUCAP 75 131* 108* 114* 85   Lipid Profile: No results for input(s): CHOL, HDL, LDLCALC, TRIG, CHOLHDL, LDLDIRECT in the last 72 hours. Thyroid Function Tests: No results for input(s): TSH, T4TOTAL, FREET4, T3FREE, THYROIDAB in the last 72 hours. Anemia Panel: No results for input(s): VITAMINB12, FOLATE, FERRITIN, TIBC, IRON, RETICCTPCT in the last 72 hours. Urine analysis:    Component Value Date/Time   COLORURINE YELLOW 03/10/2019 1749  APPEARANCEUR HAZY (A) 03/10/2019 1749   LABSPEC 1.008 03/10/2019 1749   PHURINE 6.0 03/10/2019 1749   GLUCOSEU NEGATIVE 03/10/2019 1749   HGBUR SMALL (A) 03/10/2019 1749   BILIRUBINUR NEGATIVE 03/10/2019 1749   KETONESUR NEGATIVE 03/10/2019 1749   PROTEINUR NEGATIVE 03/10/2019 1749   UROBILINOGEN 0.2 05/01/2012 1913   NITRITE NEGATIVE 03/10/2019 1749   LEUKOCYTESUR LARGE (A) 03/10/2019 1749   Sepsis Labs: @LABRCNTIP (procalcitonin:4,lacticidven:4)  ) Recent Results (from the past 240 hour(s))  SARS CORONAVIRUS 2 (TAT 6-24 HRS) Nasopharyngeal Nasopharyngeal Swab     Status: None   Collection Time: 11/05/19 12:01 AM   Specimen: Nasopharyngeal Swab  Result  Value Ref Range Status   SARS Coronavirus 2 NEGATIVE NEGATIVE Final    Comment: (NOTE) SARS-CoV-2 target nucleic acids are NOT DETECTED. The SARS-CoV-2 RNA is generally detectable in upper and lower respiratory specimens during the acute phase of infection. Negative results do not preclude SARS-CoV-2 infection, do not rule out co-infections with other pathogens, and should not be used as the sole basis for treatment or other patient management decisions. Negative results must be combined with clinical observations, patient history, and epidemiological information. The expected result is Negative. Fact Sheet for Patients: SugarRoll.be Fact Sheet for Healthcare Providers: https://www.woods-mathews.com/ This test is not yet approved or cleared by the [place] FDA and  has been authorized for detection and/or diagnosis of SARS-CoV-2 by FDA under an Emergency Use Authorization (EUA). This EUA will remain  in effect (meaning this test can be used) for the duration of the COVID-19 declaration under Section 56 4(b)(1) of the Act, 21 U.S.C. section 360bbb-3(b)(1), unless the authorization is terminated or revoked sooner. Performed at [facility], [place] [address], [place], [place] [address]       Studies: No results found.  Scheduled Meds: . apixaban  2.5 mg Oral BID  . clopidogrel  75 mg Oral Daily  . docusate sodium  100 mg Oral Daily  . doxycycline  100 mg Oral Q12H  . feeding supplement (ENSURE ENLIVE)  237 mL Oral BID BM  . furosemide  20 mg Oral Daily  . gabapentin  300 mg Oral BID  . insulin aspart  0-5 Units Subcutaneous QHS  . insulin aspart  0-9 Units Subcutaneous TID WC  . levothyroxine  75 mcg Oral Daily  . pantoprazole  40 mg Oral Daily  . prenatal multivitamin  1 tablet Oral Q1200  . sodium chloride flush  3 mL Intravenous Q12H  . valACYclovir  500 mg Oral BID    Continuous Infusions: . sodium chloride    .  cefTRIAXone (ROCEPHIN)  IV 1 g (11/06/19 2135)     LOS: 2 days     Alma Friendly, MD [facility]  If 7PM-7AM, please contact night-coverage [email or web address] 11/07/2019, 5:45 PM

## 2019-11-08 LAB — CBC WITH DIFFERENTIAL/PLATELET
Abs Immature Granulocytes: 0.06 10*3/uL (ref 0.00–0.07)
Basophils Absolute: 0.1 10*3/uL (ref 0.0–0.1)
Basophils Relative: 1 %
Eosinophils Absolute: 0.8 10*3/uL — ABNORMAL HIGH (ref 0.0–0.5)
Eosinophils Relative: 11 %
HCT: 34.9 % — ABNORMAL LOW (ref 36.0–46.0)
Hemoglobin: 10.9 g/dL — ABNORMAL LOW (ref 12.0–15.0)
Immature Granulocytes: 1 %
Lymphocytes Relative: 23 %
Lymphs Abs: 1.7 10*3/uL (ref 0.7–4.0)
MCH: 28.8 pg (ref 26.0–34.0)
MCHC: 31.2 g/dL (ref 30.0–36.0)
MCV: 92.3 fL (ref 80.0–100.0)
Monocytes Absolute: 0.8 10*3/uL (ref 0.1–1.0)
Monocytes Relative: 10 %
Neutro Abs: 4 10*3/uL (ref 1.7–7.7)
Neutrophils Relative %: 54 %
Platelets: 397 10*3/uL (ref 150–400)
RBC: 3.78 MIL/uL — ABNORMAL LOW (ref 3.87–5.11)
RDW: 17.9 % — ABNORMAL HIGH (ref 11.5–15.5)
WBC: 7.4 10*3/uL (ref 4.0–10.5)
nRBC: 0 % (ref 0.0–0.2)

## 2019-11-08 LAB — BASIC METABOLIC PANEL
Anion gap: 7 (ref 5–15)
BUN: 24 mg/dL — ABNORMAL HIGH (ref 8–23)
CO2: 23 mmol/L (ref 22–32)
Calcium: 10.9 mg/dL — ABNORMAL HIGH (ref 8.9–10.3)
Chloride: 107 mmol/L (ref 98–111)
Creatinine, Ser: 1.23 mg/dL — ABNORMAL HIGH (ref 0.44–1.00)
GFR calc Af Amer: 46 mL/min — ABNORMAL LOW (ref >60)
GFR calc non Af Amer: 40 mL/min — ABNORMAL LOW (ref >60)
Glucose, Bld: 110 mg/dL — ABNORMAL HIGH (ref 70–99)
Potassium: 4.2 mmol/L (ref 3.5–5.1)
Sodium: 137 mmol/L (ref 135–145)

## 2019-11-08 LAB — GLUCOSE, CAPILLARY: Glucose-Capillary: 141 mg/dL — ABNORMAL HIGH (ref 70–99)

## 2019-11-08 MED ORDER — APIXABAN 5 MG PO TABS
5.0000 mg | ORAL_TABLET | Freq: Two times a day (BID) | ORAL | Status: DC
  Administered 2019-11-08 – 2019-11-10 (×4): 5 mg via ORAL
  Filled 2019-11-08 (×4): qty 1

## 2019-11-08 NOTE — Progress Notes (Signed)
Patient ID: Kristine Oliver, female   DOB: [birth year], 86 y.o.   MRN: [id number]  This NP visited patient at the bedside as a follow up for palliative medicine needs and emotional support.  Patient is alert and oriented and sitting on side of bed having breakfast.  Sitter remains at the bedside.  Patient denies any suicidal thoughts or ideations.  She is pleasant and talkative and we have a discussion about her discharge plans and anticipatory care needs.  She tells me although it is not perfect at home [facility] Vaughn/caretaker/friend will be there to support her "as best he can".  As discussed earlier patient tells me that she is not interested in a skilled nursing facility for rehab.  We discussed the importance of mobility and overall health and wellness.  Demonstrated to patient active range of motion exercises and simple march in place exercises.  According to her family she was able to get around the apartment and even was driving up until 2 weeks ago.  Discussed with patient her self responsibility in her over treatment plan.    She tells me today that she plans to take her medicine as prescribed and do "the best that she can do"  --1500 Later in the day I spoke with Daughter in Law/Cammie, she tells me that the patient is now interested in a transition of care to SNF for rehab.     I communicated this with attending and transition of care team.  Discussed with patient the importance of continued conversation with her family and her medical providers regarding overall plan of care and treatment options,  ensuring decisions are within the context of the patients values and GOCs.  Questions and concerns addressed   Discussed with Dr Horris Latino  Total time spent on the unit was 25 minutes  Greater than 50% of the time was spent in counseling and coordination of care  Wadie Lessen NP  Palliative Medicine Team Team Phone # [phone] Pager [phone]

## 2019-11-08 NOTE — Progress Notes (Signed)
PROGRESS NOTE    Kristine Oliver  RKY:[id number] DOB: Sep 17, 1930 DOA: 11/04/2019 PCP: Lavone Orn, MD    Brief Narrative:  [phone] with hx CAD s/p PCI, chronic diastolic CHF, persistent Afib on eliquis, CKD 3-4, DM2, htn, hld, hypothyroid presenting with acute hypoxemic failure secondary to PNA and acute chf  Assessment & Plan:   Principal Problem:   Acute respiratory failure ([place]) Active Problems:   CAD (coronary artery disease)   Essential hypertension   Acute on chronic diastolic CHF (congestive heart failure) (HCC)   Syncope   Adjustment disorder with depressed mood   Palliative care by specialist   DNR (do not resuscitate)   Acute hypoxic respiratory failure likely 2/2 acute on chronic diastolic HF/CAP Currently afebrile, with no leukocytosis Remains on 2 L via nasal cannula, cont to wean O2 as tolerated BNP 320, troponin negative COVID-19 virus negative, procalcitonin negative Chest x-ray showed diffuse interstitial infiltrates concerning for atypical infection, edema, chronic interstitial lung disease D-dimer mildly elevated, CTA chest showed no PE, diffuse bilateral groundglass airspace opacity suspicious for developing pulmonary edema versus multifocal pneumonia Echo with EF of 65 to 70%, no regional wall motion abnormality, unable to evaluate diastolic function due to persistent A. fib Currently continued on doxycycline, azithromycin Continue diuresis with home dose Lasix Continue with strict I's and O's, daily weights  Recurrent falls/?syncope Head CT unremarkable Therapy recs for SNF noted  CAD status post stents Currently chest pain-free Continue Plavix as tolerated  Diabetes mellitus type 2 Last A1c 6.5 SSI, Accu-Cheks, hypoglycemic protocol Glycemic trends reviewed, appears stable at thsi time  Persistent A. Fib Status post cardioversion in 08/2018 Currently in A. fib, rate controlled Continue Eliquis as tolerated  Hypertension BP  soft Currently holding home amlodipine, imdur, ramipril,  CKD stage III Creatinine currently at baseline Repeat bmet in AM  Hypothyroidism Continue Synthroid as tolerated  Obesity Recommend diet/lifestyle modification  Possible depression/goals of care discussion/ Psychiatry and palliative care have been consulted-patient with histrionic behavior, no intent to harm herself, according to family, her usual behavior.  Possible undiagnosed bipolar disorder as per psychiatrist Not meeting criteria currently for hospice Recommend outpatient palliative follow-up  DVT prophylaxis: Eliquis Code Status: DNR Family Communication: Pt in room, family not at bedside Disposition Plan: From home, plan d/c snf pending bed availability per SW  Consultants:   Psychiatry  Palliative care  Procedures:     Antimicrobials: Anti-infectives (From admission, onward)   Start     Dose/Rate Route Frequency Ordered Stop   11/05/19 2200  cefTRIAXone (ROCEPHIN) 1 g in sodium chloride 0.9 % 100 mL IVPB     1 g 200 mL/hr over 30 Minutes Intravenous Every 24 hours 11/05/19 0042 11/09/19 2359   11/05/19 1400  valACYclovir (VALTREX) tablet 500 mg     500 mg Oral 2 times daily 11/05/19 1319     11/05/19 1000  doxycycline (VIBRA-TABS) tablet 100 mg     100 mg Oral Every 12 hours 11/05/19 0042 11/09/19 2359   11/04/19 2345  cefTRIAXone (ROCEPHIN) 1 g in sodium chloride 0.9 % 100 mL IVPB     1 g 200 mL/hr over 30 Minutes Intravenous  Once 11/04/19 2339 11/05/19 0102   11/04/19 2345  doxycycline (VIBRA-TABS) tablet 100 mg     100 mg Oral  Once 11/04/19 2339 11/05/19 0032       Subjective: Without complaints this AM. Awaiting placement to SNF  Objective: Vitals:   11/07/19 2156 11/07/19 2159 11/07/19 2202 11/08/19 0900  BP: 115/81 (!) 108/38 107/90 (!) 114/43  Pulse: 82 84 77 65  Resp:  16  18  Temp:  98 F (36.7 C)  (!) 97.5 F (36.4 C)  TempSrc:  Oral  Oral  SpO2: 98% 97% 97% 94%   Weight:      Height:        Intake/Output Summary (Last 24 hours) at 11/08/2019 1348 Last data filed at 11/08/2019 1240 Gross per 24 hour  Intake 820 ml  Output 0 ml  Net 820 ml   Filed Weights   11/04/19 2136 11/05/19 0247 11/06/19 0500  Weight: 70.8 kg 71.2 kg 70.9 kg    Examination: General exam: Awake, laying in bed, in nad Respiratory system: Normal respiratory effort, no wheezing Cardiovascular system: regular rate, s1, s2 Gastrointestinal system: Soft, nondistended, positive BS Central nervous system: CN2-12 grossly intact, strength intact Extremities: Perfused, no clubbing Skin: Normal skin turgor, no notable skin lesions seen Psychiatry: Mood normal // no visual hallucinations   Data Reviewed: I have personally reviewed following labs and imaging studies  CBC: Recent Labs  Lab 11/04/19 2146 11/05/19 0112 11/06/19 0228 11/07/19 0315 11/08/19 0313  WBC 11.7* 8.9 9.0 8.3 7.4  NEUTROABS 7.6  --  5.9 5.2 4.0  HGB 10.3* 10.4* 10.5* 10.9* 10.9*  HCT 33.7* 33.2* 33.4* 34.9* 34.9*  MCV 92.8 93.5 91.3 91.1 92.3  PLT 407* 373 365 388 294   Basic Metabolic Panel: Recent Labs  Lab 11/04/19 2146 11/06/19 0228 11/07/19 0315 11/08/19 0313  NA 133* 136 138 137  K 4.6 4.2 4.4 4.2  CL 105 104 108 107  CO2 22 20* 22 23  GLUCOSE 100* 109* 113* 110*  BUN 18 19 20  24*  CREATININE 1.18* 1.21* 1.15* 1.23*  CALCIUM 11.0* 10.8* 11.3* 10.9*   GFR: Estimated Creatinine Clearance: 28.1 mL/min (A) (by C-G formula based on SCr of 1.23 mg/dL (H)). Liver Function Tests: No results for input(s): AST, ALT, ALKPHOS, BILITOT, PROT, ALBUMIN in the last 168 hours. No results for input(s): LIPASE, AMYLASE in the last 168 hours. No results for input(s): AMMONIA in the last 168 hours. Coagulation Profile: No results for input(s): INR, PROTIME in the last 168 hours. Cardiac Enzymes: No results for input(s): CKTOTAL, CKMB, CKMBINDEX, TROPONINI in the last 168 hours. BNP (last 3  results) No results for input(s): PROBNP in the last 8760 hours. HbA1C: No results for input(s): HGBA1C in the last 72 hours. CBG: Recent Labs  Lab 11/05/19 1235 11/05/19 2136 11/06/19 1128 11/07/19 0802 11/07/19 2132  GLUCAP 131* 108* 114* 85 117*   Lipid Profile: No results for input(s): CHOL, HDL, LDLCALC, TRIG, CHOLHDL, LDLDIRECT in the last 72 hours. Thyroid Function Tests: No results for input(s): TSH, T4TOTAL, FREET4, T3FREE, THYROIDAB in the last 72 hours. Anemia Panel: No results for input(s): VITAMINB12, FOLATE, FERRITIN, TIBC, IRON, RETICCTPCT in the last 72 hours. Sepsis Labs: Recent Labs  Lab 11/05/19 0109  PROCALCITON <0.10    Recent Results (from the past 240 hour(s))  SARS CORONAVIRUS 2 (TAT 6-24 HRS) Nasopharyngeal Nasopharyngeal Swab     Status: None   Collection Time: 11/05/19 12:01 AM   Specimen: Nasopharyngeal Swab  Result Value Ref Range Status   SARS Coronavirus 2 NEGATIVE NEGATIVE Final    Comment: (NOTE) SARS-CoV-2 target nucleic acids are NOT DETECTED. The SARS-CoV-2 RNA is generally detectable in upper and lower respiratory specimens during the acute phase of infection. Negative results do not preclude SARS-CoV-2 infection, do not rule out co-infections with  other pathogens, and should not be used as the sole basis for treatment or other patient management decisions. Negative results must be combined with clinical observations, patient history, and epidemiological information. The expected result is Negative. Fact Sheet for Patients: SugarRoll.be Fact Sheet for Healthcare Providers: https://www.woods-mathews.com/ This test is not yet approved or cleared by the [place] FDA and  has been authorized for detection and/or diagnosis of SARS-CoV-2 by FDA under an Emergency Use Authorization (EUA). This EUA will remain  in effect (meaning this test can be used) for the duration of the COVID-19  declaration under Section 56 4(b)(1) of the Act, 21 U.S.C. section 360bbb-3(b)(1), unless the authorization is terminated or revoked sooner. Performed at [facility], [place] [address] [place], [place] [address]      Radiology Studies: No results found.  Scheduled Meds: . apixaban  5 mg Oral BID  . clopidogrel  75 mg Oral Daily  . docusate sodium  100 mg Oral Daily  . doxycycline  100 mg Oral Q12H  . feeding supplement (ENSURE ENLIVE)  237 mL Oral BID BM  . furosemide  20 mg Oral Daily  . gabapentin  300 mg Oral BID  . insulin aspart  0-5 Units Subcutaneous QHS  . insulin aspart  0-9 Units Subcutaneous TID WC  . levothyroxine  75 mcg Oral Daily  . pantoprazole  40 mg Oral Daily  . prenatal multivitamin  1 tablet Oral Q1200  . sodium chloride flush  3 mL Intravenous Q12H  . valACYclovir  500 mg Oral BID   Continuous Infusions: . sodium chloride    . cefTRIAXone (ROCEPHIN)  IV 1 g (11/07/19 2158)     LOS: 3 days   Marylu Lund, MD [facility] Pager On Amion  If 7PM-7AM, please contact night-coverage 11/08/2019, 1:48 PM

## 2019-11-08 NOTE — Evaluation (Signed)
Occupational Therapy  ReEvaluation Patient Details Name: Kristine Oliver MRN: [id number] DOB: 1931-07-31 Today's Date: 11/08/2019    History of Present Illness Pt is an 86 year old woman admitted from home with SOB and recent falls. PMH: CAD, COPD, CHF, CKD III-IV, DM, HTN, IBS, obesity, peripheral neuropathy, DDD, retinopathy, afib.   Clinical Impression   Pt grateful to work with therapy not that she has decided to live. Requires min assist for ADL with intermittent rest breaks and use of 2L 02 to keep Sp02 greater than 90%. Recommending ST rehab in SNF. Pt is agreeable.    Follow Up Recommendations  SNF;Supervision/Assistance - 24 hour    Equipment Recommendations  None recommended by OT    Recommendations for Other Services       Precautions / Restrictions Precautions Precautions: Fall Restrictions Weight Bearing Restrictions: No      Mobility Bed Mobility               General bed mobility comments: patient received sitting up on side of bed eating breakfast  Transfers Overall transfer level: Needs assistance Equipment used: Rolling walker (2 wheeled) Transfers: Sit to/from Stand Sit to Stand: Min guard         General transfer comment: cues for safe technique, pt is used to pulling up from rollator    Balance Overall balance assessment: Needs assistance Sitting-balance support: Feet supported Sitting balance-Leahy Scale: Good Sitting balance - Comments: no LOB donning socks, R shoe   Standing balance support: Bilateral upper extremity supported;During functional activity Standing balance-Leahy Scale: Poor Standing balance comment: min guard at sink during grooming                           ADL either performed or assessed with clinical judgement   ADL Overall ADL's : Needs assistance/impaired Eating/Feeding: Independent   Grooming: Min guard;Sitting;Standing;Brushing hair;Oral care;Wash/dry hands   Upper Body Bathing: Set up;Sitting    Lower Body Bathing: Minimal assistance;Sit to/from stand   Upper Body Dressing : Set up;Sitting   Lower Body Dressing: Minimal assistance;Sit to/from stand Lower Body Dressing Details (indicate cue type and reason): for L shoe Toilet Transfer: Min guard;RW;Stand-pivot   Toileting- Clothing Manipulation and Hygiene: Minimal assistance;Sit to/from stand       Functional mobility during ADLs: Minimal assistance;Rolling walker General ADL Comments: pt reliant on 2L 02 to maintain sats >90%     Vision Patient Visual Report: No change from baseline       Perception     Praxis      Pertinent Vitals/Pain Pain Assessment: Faces Pain Score: 7  Faces Pain Scale: Hurts even more Pain Location: back with mobility  Pain Descriptors / Indicators: Aching;Sore Pain Intervention(s): Monitored during session;Repositioned     Hand Dominance Right   Extremity/Trunk Assessment Upper Extremity Assessment Upper Extremity Assessment: Overall WFL for tasks assessed   Lower Extremity Assessment Lower Extremity Assessment: Defer to PT evaluation   Cervical / Trunk Assessment Cervical / Trunk Assessment: Kyphotic Cervical / Trunk Exceptions: pt with stenosis, chronic back pain   Communication Communication Communication: No difficulties   Cognition Arousal/Alertness: Awake/alert Behavior During Therapy: WFL for tasks assessed/performed Overall Cognitive Status: Within Functional Limits for tasks assessed                                 General Comments: pt easily redirected from her ailments  General Comments      Exercises     Shoulder Instructions      Home Living Family/patient expects to be discharged to:: Private residence Living Arrangements: Non-relatives/Friends Available Help at Discharge: Other (Comment)(roommate unable to assist much) Type of Home: Other(Comment)(condo) Home Access: Stairs to enter Entrance Stairs-Number of Steps: 1   Home Layout:  One level     Bathroom Shower/Tub: Teacher, early years/pre: Standard     Home Equipment: Shower seat;Grab bars - tub/shower;Hand held Tourist information centre manager - 4 wheels          Prior Functioning/Environment Level of Independence: Independent with assistive device(s)        Comments: prepares simple meals, has a housekeeper once a month to clean        OT Problem List: Decreased strength      OT Treatment/Interventions: Self-care/ADL training;DME and/or AE instruction;Patient/family education;Balance training;Energy conservation;Therapeutic activities    OT Goals(Current goals can be found in the care plan section) Acute Rehab OT Goals Patient Stated Goal: to live OT Goal Formulation: With patient Time For Goal Achievement: 11/22/19 Potential to Achieve Goals: Good  OT Frequency: Min 2X/week   Barriers to D/C:            Co-evaluation   Reason for Co-Treatment: For patient/therapist safety PT goals addressed during session: Mobility/safety with mobility;Proper use of DME        AM-PAC OT "6 Clicks" Daily Activity     Outcome Measure Help from another person eating meals?: None Help from another person taking care of personal grooming?: A Little Help from another person toileting, which includes using toliet, bedpan, or urinal?: A Little Help from another person bathing (including washing, rinsing, drying)?: A Little Help from another person to put on and taking off regular upper body clothing?: None Help from another person to put on and taking off regular lower body clothing?: A Little 6 Click Score: 20   End of Session Equipment Utilized During Treatment: Gait belt;Rolling walker;Oxygen(2L)  Activity Tolerance: Patient tolerated treatment well Patient left: in chair;with call bell/phone within reach;with chair alarm set  OT Visit Diagnosis: Other abnormalities of gait and mobility (R26.89);Pain;Muscle weakness (generalized) (M62.81)                 Time: 7106-2694 OT Time Calculation (min): 48 min Charges:  OT General Charges $OT Visit: 1 Visit OT Evaluation $OT Re-eval: 1 Re-eval OT Treatments $Self Care/Home Management : 8-22 mins  Malka So 11/08/2019, 11:45 AM  Nestor Lewandowsky, OTR/L Acute Rehabilitation Services Pager: [phone] Office: [phone]

## 2019-11-08 NOTE — Discharge Instructions (Signed)

## 2019-11-08 NOTE — NC FL2 (Signed)
[place] MEDICAID FL2 LEVEL OF CARE SCREENING TOOL     IDENTIFICATION  Patient Name: Kristine Oliver Birthdate: May 11, 1931 Sex: female Admission Date (Current Location): 11/04/2019  [facility] and [place] Number:  Herbalist and Address:  [facility], [place] [address] [place], [place] [address]      Provider Number: [id number]  Attending Physician Name and Address:  Donne Hazel, MD  Relative Name and Phone Number:  [name], daughter in law, [phone]    Current Level of Care: Hospital Recommended Level of Care: [place] Prior Approval Number:    Date Approved/Denied:   PASRR Number: [id number]  Discharge Plan: SNF    Current Diagnoses: Patient Active Problem List   Diagnosis Date Noted  . Palliative care by specialist   . DNR (do not resuscitate)   . Acute respiratory failure ([place]) 11/05/2019  . Acute on chronic diastolic CHF (congestive heart failure) ([place]) 11/05/2019  . Syncope 11/05/2019  . Adjustment disorder with depressed mood   . Stable angina ([place]) 10/10/2019  . CKD (chronic kidney disease) stage 3, GFR 30-59 ml/min 10/09/2019  . AKI (acute kidney injury) ([place])   . Unstable angina ([place]) 10/29/2018  . Persistent atrial fibrillation ([place])   . Status post coronary artery stent placement   . NSTEMI (non-ST elevated myocardial infarction) ([place]) 03/18/2018  . Essential hypertension 05/19/2016  . Chest pain 05/18/2016  . Bradycardia 09/26/2014  . Sinus bradycardia 10/30/2013  . Weakness generalized 10/30/2013  . Chronic diastolic CHF (congestive heart failure) ([place]) 06/26/2013  . CAD (coronary artery disease) 02/12/2012  . Hypercalcemia 02/12/2012  . Chronic pain disorder   . Hypothyroidism   . Type 2 diabetes mellitus with vascular disease ([place])   . Hyperlipidemia associated with type 2 diabetes mellitus ([place])   . Lumbar disc disease   . Obesity (BMI 30-39.9)   . Hypertensive heart disease  without CHF   . Bipolar affective disorder ([place])   . Scoliosis     Orientation RESPIRATION BLADDER Height & Weight     Situation, Place, Time, Self  Normal Continent Weight: 156 lb 4.9 oz (70.9 kg) Height:  4\' 11"  (149.9 cm)  BEHAVIORAL SYMPTOMS/MOOD NEUROLOGICAL BOWEL NUTRITION STATUS      Continent Diet  AMBULATORY STATUS COMMUNICATION OF NEEDS Skin   Limited Assist Verbally Normal                       Personal Care Assistance Level of Assistance  Dressing, Feeding, Bathing Bathing Assistance: Limited assistance Feeding assistance: Independent Dressing Assistance: Limited assistance     Functional Limitations Info             SPECIAL CARE FACTORS FREQUENCY  PT (By licensed PT), OT (By licensed OT)     PT Frequency: 5x a day OT Frequency: 5x a day            Contractures      Additional Factors Info  Insulin Sliding Scale, Allergies, Code Status Code Status Info: DNR Allergies Info: Pravastatin, Prednisone   Insulin Sliding Scale Info: Novolog 0-5 units at bedtime, novolog 0-9 unitys every 4 hours       Current Medications (11/08/2019):  This is the current hospital active medication list Current Facility-Administered Medications  Medication Dose Route Frequency Provider Last Rate Last Admin  . 0.9 %  sodium chloride infusion  250 mL Intravenous PRN Shela Leff, MD      . acetaminophen (TYLENOL)  tablet 650 mg  650 mg Oral Q4H PRN Shela Leff, MD   650 mg at 11/06/19 0126  . apixaban (ELIQUIS) tablet 5 mg  5 mg Oral BID Donne Hazel, MD      . cefTRIAXone (ROCEPHIN) 1 g in sodium chloride 0.9 % 100 mL IVPB  1 g Intravenous Q24H Donne Hazel, MD 200 mL/hr at 11/07/19 2158 1 g at 11/07/19 2158  . clopidogrel (PLAVIX) tablet 75 mg  75 mg Oral Daily Alma Friendly, MD   75 mg at 11/08/19 1016  . cycloSPORINE (RESTASIS) 0.05 % ophthalmic emulsion 1 drop  1 drop Both Eyes BID PRN Alma Friendly, MD      . docusate sodium  (COLACE) capsule 100 mg  100 mg Oral Daily Alma Friendly, MD   100 mg at 11/08/19 1017  . doxycycline (VIBRA-TABS) tablet 100 mg  100 mg Oral Q12H Donne Hazel, MD   100 mg at 11/08/19 1017  . feeding supplement (ENSURE ENLIVE) (ENSURE ENLIVE) liquid 237 mL  237 mL Oral BID BM Shela Leff, MD   237 mL at 11/08/19 1017  . furosemide (LASIX) tablet 20 mg  20 mg Oral Daily Alma Friendly, MD   20 mg at 11/08/19 1016  . gabapentin (NEURONTIN) capsule 300 mg  300 mg Oral BID Alma Friendly, MD   300 mg at 11/08/19 1016  . guaiFENesin-dextromethorphan (ROBITUSSIN DM) 100-10 MG/5ML syrup 15 mL  15 mL Oral Q4H PRN Alma Friendly, MD   15 mL at 11/07/19 0402  . insulin aspart (novoLOG) injection 0-5 Units  0-5 Units Subcutaneous QHS Shela Leff, MD      . insulin aspart (novoLOG) injection 0-9 Units  0-9 Units Subcutaneous TID WC Shela Leff, MD      . levothyroxine (SYNTHROID) tablet 75 mcg  75 mcg Oral Daily Alma Friendly, MD   75 mcg at 11/08/19 0534  . oxyCODONE (Oxy IR/ROXICODONE) immediate release tablet 10 mg  10 mg Oral Q6H PRN Alma Friendly, MD   10 mg at 11/08/19 1015  . pantoprazole (PROTONIX) EC tablet 40 mg  40 mg Oral Daily Alma Friendly, MD   40 mg at 11/08/19 1016  . prenatal multivitamin tablet 1 tablet  1 tablet Oral Q1200 Johnn Hai, MD      . sodium chloride flush (NS) 0.9 % injection 3 mL  3 mL Intravenous Q12H Shela Leff, MD   3 mL at 11/08/19 1018  . sodium chloride flush (NS) 0.9 % injection 3 mL  3 mL Intravenous PRN Shela Leff, MD      . valACYclovir (VALTREX) tablet 500 mg  500 mg Oral BID Alma Friendly, MD   500 mg at 11/08/19 1020     Discharge Medications: Please see discharge summary for a list of discharge medications.  Relevant Imaging Results:  Relevant Lab Results:   Additional Information SSN: [id number]  [name], [place]

## 2019-11-08 NOTE — Progress Notes (Signed)
Physical Therapy Treatment Patient Details Name: Kristine Oliver MRN: [id number] DOB: [birth year] Today's Date: 11/08/2019    History of Present Illness Pt is an 86 year old woman admitted from home with SOB and recent falls. PMH: CAD, COPD, CHF, CKD III-IV, DM, HTN, IBS, obesity, peripheral neuropathy, DDD, retinopathy, afib.    PT Comments    Pt is very cheerful and reports "I decided to live." Pt making progress today with mobility, however continues to be limited in safe mobility by oxygen desaturation (see General Comments) in presence of decreased strength, balance and endurance. Pt requires min guard -minA with transfers and ambulation of 25 ft with RW. Pt requires close chair follow for safety due to L knee buckling. PT recommending SNF level rehab before returning home to improve safety. PT will continue to follow acutely.   Follow Up Recommendations  SNF     Equipment Recommendations  None recommended by PT       Precautions / Restrictions Precautions Precautions: Fall Restrictions Weight Bearing Restrictions: No    Mobility  Bed Mobility               General bed mobility comments: patient received sitting up on side of bed eating breakfast  Transfers Overall transfer level: Needs assistance Equipment used: Rolling walker (2 wheeled) Transfers: Sit to/from Stand Sit to Stand: Min guard;Min assist         General transfer comment: min guard for power up from EoB, minA for power up from [facility], vc for hand placement for safe power up   Ambulation/Gait Ambulation/Gait assistance: Min guard;Min assist Gait Distance (Feet): 25 Feet(1x10, 1x25) Assistive device: Rolling walker (2 wheeled) Gait Pattern/deviations: Step-to pattern;Trunk flexed;Decreased step length - right;Decreased step length - left Gait velocity: decreased   General Gait Details: pt prefers Rollator, multimodal cuing for proximity to RW and upright posture, after ambulation of 20 feet, L knee  began to bucklw with steps, sat back in recliner and rolled back to room        Balance Overall balance assessment: Modified Independent;Needs assistance Sitting-balance support: Feet supported Sitting balance-Leahy Scale: Fair     Standing balance support: Bilateral upper extremity supported;During functional activity Standing balance-Leahy Scale: Poor Standing balance comment: requires UE support for balance                            Cognition Arousal/Alertness: Awake/alert Behavior During Therapy: (depressed, crying, states she just wants to be comfortable and does not want to do all this, she is tired.) Overall Cognitive Status: Within Functional Limits for tasks assessed                                 General Comments: patient perseverates on her skinny legs         General Comments General comments (skin integrity, edema, etc.): Pt on 2L O2 via [place], SaO2 92%O2, on RA SaO2 dropped to 79%O2, supplemental O2 returned and with cues for pursed lipped breathing returned to 90%O2. With ambulation SaO2 dropped to 88%O2, with cues for pursed lipped breathing rebounded to 91%O2      Pertinent Vitals/Pain Pain Assessment: 0-10 Pain Score: 7  Pain Location: back with mobility  Pain Descriptors / Indicators: Aching;Sore Pain Intervention(s): Limited activity within patient's tolerance;Monitored during session;Repositioned           PT Goals (current goals can now be  found in the care plan section) Acute Rehab PT Goals Patient Stated Goal: pt feeling better and has decided to live  PT Goal Formulation: With patient Time For Goal Achievement: 11/13/19 Potential to Achieve Goals: Fair Progress towards PT goals: Progressing toward goals    Frequency    Min 2X/week      PT Plan Discharge plan needs to be updated    Co-evaluation PT/OT/SLP Co-Evaluation/Treatment: Yes Reason for Co-Treatment: For patient/therapist safety PT goals addressed  during session: Mobility/safety with mobility;Proper use of DME        AM-PAC PT "6 Clicks" Mobility   Outcome Measure  Help needed turning from your back to your side while in a flat bed without using bedrails?: A Little Help needed moving from lying on your back to sitting on the side of a flat bed without using bedrails?: A Little Help needed moving to and from a bed to a chair (including a wheelchair)?: A Little Help needed standing up from a chair using your arms (e.g., wheelchair or bedside chair)?: A Little Help needed to walk in hospital room?: A Little Help needed climbing 3-5 steps with a railing? : A Lot 6 Click Score: 17    End of Session Equipment Utilized During Treatment: Gait belt Activity Tolerance: Patient limited by fatigue Patient left: in bed;with nursing/sitter in room Nurse Communication: Mobility status PT Visit Diagnosis: Difficulty in walking, not elsewhere classified (R26.2);Muscle weakness (generalized) (M62.81);History of falling (Z91.81)     Time: 4580-9983 PT Time Calculation (min) (ACUTE ONLY): 42 min  Charges:  $Gait Training: 8-22 mins                     Elijiah Mickley B. Migdalia Dk PT, DPT Acute Rehabilitation Services Pager [phone] Office [phone]    [place] 11/08/2019, 9:28 AM

## 2019-11-08 NOTE — TOC Initial Note (Signed)
Transition of Care [facility]) - Initial/Assessment Note    Patient Details  Name: Kristine Oliver MRN: [id number] Date of Birth: [birth year]  [facility]) CM/SW Contact:    Blima Ledger Phone Number: [phone] 11/08/2019, 3:26 PM  Clinical Narrative:                 CSW met with pt via bedside. CSW introduced self and reviewed her role. Pt stated that prior to admission she was living at her condo and with hr daughter in laws brother. Pt stated that she was using a rollator to get around the house sometimes. Pt stated lately she was having a little more difficulty with fixing food and walking longer distances. Pt stated she has been having lower energy than usual.   CSW and pt discussed PT/OT recommendations of SNF. Pt initially stated no, but csw explained that its short term not long term care. Pt agreed that she would go to snf for a short period of time. Pt stated that she has not been to snf before and would like to sty in [employer].   Pt stated that csw can call her son or daughter in law.  CSW called daughter in law Cammie. Cammie stated that she agrees with the SNF recommendation. CSW informed Cammie that she will fax her out to facilities in [place] and present her with acceptances and ratings.   CSW will continue to follow.   Expected Discharge Plan: [place] Barriers to Discharge: Continued Medical Work up   Patient Goals and CMS Choice Patient states their goals for this hospitalization and ongoing recovery are:: To go back home to my condo CMS Medicare.gov Compare Post Acute Care list provided to:: Patient Represenative (must comment)(Daughter in law, cammie) Choice offered to / list presented to : Patient, Adult Children  Expected Discharge Plan and Services Expected Discharge Plan: [place]       Living arrangements for the past 2 months: Apartment                                      Prior Living  Arrangements/Services Living arrangements for the past 2 months: Apartment Lives with:: Relatives Patient language and need for interpreter reviewed:: Yes Do you feel safe going back to the place where you live?: Yes      Need for Family Participation in Patient Care: Yes (Comment) Care giver support system in place?: Yes (comment)   Criminal Activity/Legal Involvement Pertinent to Current Situation/Hospitalization: No - Comment as needed  Activities of Daily Living Home Assistive Devices/Equipment: Environmental consultant (specify type), Shower chair with back ADL Screening (condition at time of admission) Patient's cognitive ability adequate to safely complete daily activities?: Yes Is the patient deaf or have difficulty hearing?: No Does the patient have difficulty seeing, even when wearing glasses/contacts?: No Does the patient have difficulty concentrating, remembering, or making decisions?: No Patient able to express need for assistance with ADLs?: Yes Does the patient have difficulty dressing or bathing?: Yes Independently performs ADLs?: Yes (appropriate for developmental age) Does the patient have difficulty walking or climbing stairs?: Yes Weakness of Legs: Both Weakness of Arms/Hands: Both  Permission Sought/Granted Permission sought to share information with : Facility Theatre stage manager Information with NAME: Cammie Johjson  Permission granted to share info w AGENCY: SNF  Permission granted to share info w Relationship: daughter in law  Permission granted to share info w Contact Information: [phone]  Emotional Assessment Appearance:: Appears stated age Attitude/Demeanor/Rapport: Engaged Affect (typically observed): Appropriate Orientation: : Oriented to Self, Oriented to Place, Oriented to  Time, Oriented to Situation      Admission diagnosis:  Acute respiratory failure ([place]) [J96.00] Patient Active Problem List   Diagnosis Date Noted  . Palliative care by  specialist   . DNR (do not resuscitate)   . Acute respiratory failure ([place]) 11/05/2019  . Acute on chronic diastolic CHF (congestive heart failure) ([place]) 11/05/2019  . Syncope 11/05/2019  . Adjustment disorder with depressed mood   . Stable angina ([place]) 10/10/2019  . CKD (chronic kidney disease) stage 3, GFR 30-59 ml/min 10/09/2019  . AKI (acute kidney injury) ([place])   . Unstable angina ([place]) 10/29/2018  . Persistent atrial fibrillation ([place])   . Status post coronary artery stent placement   . NSTEMI (non-ST elevated myocardial infarction) ([place]) 03/18/2018  . Essential hypertension 05/19/2016  . Chest pain 05/18/2016  . Bradycardia 09/26/2014  . Sinus bradycardia 10/30/2013  . Fatigue 10/30/2013  . Chronic diastolic CHF (congestive heart failure) ([place]) 06/26/2013  . CAD (coronary artery disease) 02/12/2012  . Hypercalcemia 02/12/2012  . Chronic pain disorder   . Hypothyroidism   . Type 2 diabetes mellitus with vascular disease ([place])   . Hyperlipidemia associated with type 2 diabetes mellitus ([place])   . Lumbar disc disease   . Obesity (BMI 30-39.9)   . Hypertensive heart disease without CHF   . Bipolar affective disorder ([place])   . Scoliosis    PCP:  Lavone Orn, MD Pharmacy:   [facility] [address] - [place], [place]. [place] [address] Phone: [phone] Fax: [place], [address] AT [address] [place] [address] Phone: [phone] Fax: [phone]     Social Determinants of Health (SDOH) Interventions    Readmission Risk Interventions No flowsheet data found.  [name], Latanya Presser, [place] Licensed Holiday representative

## 2019-11-09 ENCOUNTER — Inpatient Hospital Stay (HOSPITAL_COMMUNITY): Payer: Medicare PPO

## 2019-11-09 DIAGNOSIS — R0902 Hypoxemia: Secondary | ICD-10-CM

## 2019-11-09 LAB — CBC WITH DIFFERENTIAL/PLATELET
Abs Immature Granulocytes: 0.04 10*3/uL (ref 0.00–0.07)
Basophils Absolute: 0.1 10*3/uL (ref 0.0–0.1)
Basophils Relative: 1 %
Eosinophils Absolute: 1 10*3/uL — ABNORMAL HIGH (ref 0.0–0.5)
Eosinophils Relative: 12 %
HCT: 38.3 % (ref 36.0–46.0)
Hemoglobin: 11.9 g/dL — ABNORMAL LOW (ref 12.0–15.0)
Immature Granulocytes: 1 %
Lymphocytes Relative: 24 %
Lymphs Abs: 2 10*3/uL (ref 0.7–4.0)
MCH: 28.8 pg (ref 26.0–34.0)
MCHC: 31.1 g/dL (ref 30.0–36.0)
MCV: 92.7 fL (ref 80.0–100.0)
Monocytes Absolute: 0.7 10*3/uL (ref 0.1–1.0)
Monocytes Relative: 9 %
Neutro Abs: 4.3 10*3/uL (ref 1.7–7.7)
Neutrophils Relative %: 53 %
Platelets: 389 10*3/uL (ref 150–400)
RBC: 4.13 MIL/uL (ref 3.87–5.11)
RDW: 17.9 % — ABNORMAL HIGH (ref 11.5–15.5)
WBC: 8.1 10*3/uL (ref 4.0–10.5)
nRBC: 0 % (ref 0.0–0.2)

## 2019-11-09 LAB — GLUCOSE, CAPILLARY: Glucose-Capillary: 81 mg/dL (ref 70–99)

## 2019-11-09 LAB — BASIC METABOLIC PANEL
Anion gap: 8 (ref 5–15)
BUN: 22 mg/dL (ref 8–23)
CO2: 24 mmol/L (ref 22–32)
Calcium: 11.2 mg/dL — ABNORMAL HIGH (ref 8.9–10.3)
Chloride: 105 mmol/L (ref 98–111)
Creatinine, Ser: 1.21 mg/dL — ABNORMAL HIGH (ref 0.44–1.00)
GFR calc Af Amer: 47 mL/min — ABNORMAL LOW (ref >60)
GFR calc non Af Amer: 40 mL/min — ABNORMAL LOW (ref >60)
Glucose, Bld: 115 mg/dL — ABNORMAL HIGH (ref 70–99)
Potassium: 4.6 mmol/L (ref 3.5–5.1)
Sodium: 137 mmol/L (ref 135–145)

## 2019-11-09 LAB — SARS CORONAVIRUS 2 (TAT 6-24 HRS): SARS Coronavirus 2: NEGATIVE

## 2019-11-09 MED ORDER — HYDROXYZINE HCL 10 MG PO TABS
10.0000 mg | ORAL_TABLET | Freq: Three times a day (TID) | ORAL | Status: DC | PRN
  Administered 2019-11-09: 10 mg via ORAL
  Filled 2019-11-09 (×2): qty 1

## 2019-11-09 MED ORDER — HYDROXYZINE HCL 10 MG PO TABS
10.0000 mg | ORAL_TABLET | Freq: Three times a day (TID) | ORAL | Status: DC | PRN
  Filled 2019-11-09: qty 1

## 2019-11-09 MED ORDER — FLUTICASONE PROPIONATE 50 MCG/ACT NA SUSP
1.0000 | Freq: Every day | NASAL | Status: DC
  Administered 2019-11-09 – 2019-11-10 (×2): 1 via NASAL
  Filled 2019-11-09: qty 16

## 2019-11-09 MED ORDER — HYDROXYZINE HCL 10 MG/5ML PO SYRP
10.0000 mg | ORAL_SOLUTION | Freq: Three times a day (TID) | ORAL | Status: DC | PRN
  Filled 2019-11-09: qty 5

## 2019-11-09 NOTE — Progress Notes (Signed)
Patient refused bedtime CBG.

## 2019-11-09 NOTE — Progress Notes (Signed)
Physical Therapy Treatment Patient Details Name: Kristine Oliver MRN: [id number] DOB: 08-02-1931 Today's Date: 11/09/2019    History of Present Illness Pt is an 86 year old woman admitted from home with SOB and recent falls. PMH: CAD, COPD, CHF, CKD III-IV, DM, HTN, IBS, obesity, peripheral neuropathy, DDD, retinopathy, afib.    PT Comments    Pt is agreeable to working with therapy but states "I'm so weak". Pt is limited in safe mobility by decreased safety awareness in presence of decreased strength and balance. Pt currently requiring min guard for bed mobility and min guard for transfers and min A for ambulation of 20 feet with RW. D/c plan remains appropriate. PT will continue to follow acutely.     Follow Up Recommendations  SNF     Equipment Recommendations  None recommended by PT       Precautions / Restrictions Precautions Precautions: Fall Restrictions Weight Bearing Restrictions: No    Mobility  Bed Mobility Overal bed mobility: Needs Assistance Bed Mobility: Supine to Sit;Sit to Supine     Supine to sit: Supervision Sit to supine: Min guard   General bed mobility comments: supervision for safety with pulling to EOB using the bedrail, min guard for return to bed vc for correct direction of turning to not get tangled in O2 line  Transfers Overall transfer level: Needs assistance Equipment used: Rolling walker (2 wheeled) Transfers: Sit to/from Stand Sit to Stand: Min guard         General transfer comment: min guard for safety, vc for hand placement, good power up and self steadying  Ambulation/Gait Ambulation/Gait assistance: Min guard;Min assist Gait Distance (Feet): 20 Feet Assistive device: Rolling walker (2 wheeled) Gait Pattern/deviations: Step-to pattern;Trunk flexed;Decreased step length - right;Decreased step length - left Gait velocity: decreased Gait velocity interpretation: <1.8 ft/sec, indicate of risk for recurrent falls General Gait  Details: continues to require multimodal cuing for proximity to RW and upright posture. L knee buckling throughout ambulation, min A for steadying with turning around with RW at the door         Balance Overall balance assessment: Modified Independent;Needs assistance Sitting-balance support: Feet supported Sitting balance-Leahy Scale: Fair     Standing balance support: Bilateral upper extremity supported;During functional activity Standing balance-Leahy Scale: Poor Standing balance comment: requires UE support for balance                            Cognition Arousal/Alertness: Awake/alert Behavior During Therapy: WFL for tasks assessed/performed Overall Cognitive Status: Within Functional Limits for tasks assessed                                 General Comments: pt with appropriate questioning about rehab      Exercises General Exercises - Lower Extremity Quad Sets: AROM;Both;Seated Gluteal Sets: AROM;Both;10 reps;Seated Long Arc Quad: AROM;Both;10 reps;Seated Hip ABduction/ADduction: AROM;Both;10 reps;Seated Hip Flexion/Marching: AROM;Both;10 reps;Seated Toe Raises: AROM;Both;10 reps;Seated Heel Raises: AROM;Both;10 reps;Seated    General Comments General comments (skin integrity, edema, etc.): Pt on 2L O2 via , VSS throughout session      Pertinent Vitals/Pain Pain Assessment: Faces Faces Pain Scale: Hurts little more Pain Location: back with mobility  Pain Descriptors / Indicators: Aching;Sore Pain Intervention(s): Limited activity within patient's tolerance;Monitored during session;Repositioned           PT Goals (current goals can now be found in  the care plan section) Acute Rehab PT Goals Patient Stated Goal: pt feeling better and has decided to live  PT Goal Formulation: With patient Time For Goal Achievement: 11/13/19 Potential to Achieve Goals: Fair Progress towards PT goals: Progressing toward goals    Frequency     Min 2X/week      PT Plan Discharge plan needs to be updated       AM-PAC PT "6 Clicks" Mobility   Outcome Measure  Help needed turning from your back to your side while in a flat bed without using bedrails?: A Little Help needed moving from lying on your back to sitting on the side of a flat bed without using bedrails?: A Little Help needed moving to and from a bed to a chair (including a wheelchair)?: A Little Help needed standing up from a chair using your arms (e.g., wheelchair or bedside chair)?: A Little Help needed to walk in hospital room?: A Little Help needed climbing 3-5 steps with a railing? : A Lot 6 Click Score: 17    End of Session Equipment Utilized During Treatment: Gait belt Activity Tolerance: Patient limited by fatigue Patient left: in bed Nurse Communication: Mobility status PT Visit Diagnosis: Difficulty in walking, not elsewhere classified (R26.2);Muscle weakness (generalized) (M62.81);History of falling (Z91.81);Unsteadiness on feet (R26.81);Other abnormalities of gait and mobility (R26.89)     Time: 7342-8768 PT Time Calculation (min) (ACUTE ONLY): 28 min  Charges:  $Gait Training: 8-22 mins $Therapeutic Exercise: 8-22 mins                     Pheobe Sandiford B. Migdalia Dk PT, DPT Acute Rehabilitation Services Pager [phone] Office [phone]    [place] 11/09/2019, 2:45 PM

## 2019-11-09 NOTE — Clinical Social Work Note (Signed)
Bed offered and accepted at [facility]. Insurance Josem Kaufmann has been started, waiting on approval and COVID test to result.

## 2019-11-09 NOTE — Plan of Care (Signed)

## 2019-11-09 NOTE — Progress Notes (Signed)
PROGRESS NOTE    Kristine Oliver  JSE:[id number] DOB: [birth year] DOA: 11/04/2019 PCP: Lavone Orn, MD    Brief Narrative:  [phone] with hx CAD s/p PCI, chronic diastolic CHF, persistent Afib on eliquis, CKD 3-4, DM2, htn, hld, hypothyroid presenting with acute hypoxemic failure secondary to PNA and acute chf  Assessment & Plan:   Principal Problem:   Acute respiratory failure ([place]) Active Problems:   CAD (coronary artery disease)   Weakness generalized   Essential hypertension   Acute on chronic diastolic CHF (congestive heart failure) (HCC)   Syncope   Adjustment disorder with depressed mood   Palliative care by specialist   DNR (do not resuscitate)   Acute hypoxic respiratory failure likely 2/2 acute on chronic diastolic HF/CAP Currently afebrile, with no leukocytosis Remains on 2 L via nasal cannula, cont to wean O2 as tolerated BNP 320, troponin negative COVID-19 virus negative, procalcitonin negative Chest x-ray showed diffuse interstitial infiltrates concerning for atypical infection, edema, chronic interstitial lung disease D-dimer mildly elevated, CTA chest showed no PE, diffuse bilateral groundglass airspace opacity suspicious for developing pulmonary edema versus multifocal pneumonia Echo with EF of 65 to 70%, no regional wall motion abnormality, unable to evaluate diastolic function due to persistent A. fib Currently on rocephin, plan to complete course today Continue with lasix as tolerated Continue with strict I's and O's, daily weights Mildly hypoxemic this AM. Ordered and reviewed CXR, appears stable  Recurrent falls/?syncope Head CT unremarkable Therapy recs for SNF noted, awaiting covid test  CAD status post stents Currently chest pain-free Continue Plavix as pt tolerates  Diabetes mellitus type 2 Last A1c 6.5 SSI, Accu-Cheks, hypoglycemic protocol Glycemic trends reviewed, remains stable at thsi time  Persistent A. Fib Status post cardioversion  in 08/2018 Currently in A. fib, rate controlled Continue Eliquis as pt tolerates  Hypertension BP soft Currently holding home amlodipine, imdur, ramipril  CKD stage III Creatinine currently at baseline Repeat bmet in AM  Hypothyroidism Continue Synthroid as pt tolerates  Obesity Recommend diet/lifestyle modification  Possible depression/goals of care discussion/ Psychiatry and palliative care have been consulted-patient with histrionic behavior, no intent to harm herself, according to family, her usual behavior.  Possible undiagnosed bipolar disorder as per psychiatrist Not meeting criteria currently for hospice Recommend outpatient palliative follow-up  DVT prophylaxis: Eliquis Code Status: DNR Family Communication: Pt in room, family not at bedside Disposition Plan: From home, plan d/c snf pending bed availability per SW  Consultants:   Psychiatry  Palliative care  Procedures:     Antimicrobials: Anti-infectives (From admission, onward)   Start     Dose/Rate Route Frequency Ordered Stop   11/05/19 2200  cefTRIAXone (ROCEPHIN) 1 g in sodium chloride 0.9 % 100 mL IVPB     1 g 200 mL/hr over 30 Minutes Intravenous Every 24 hours 11/05/19 0042 11/09/19 2359   11/05/19 1400  valACYclovir (VALTREX) tablet 500 mg     500 mg Oral 2 times daily 11/05/19 1319     11/05/19 1000  doxycycline (VIBRA-TABS) tablet 100 mg     100 mg Oral Every 12 hours 11/05/19 0042 11/09/19 2359   11/04/19 2345  cefTRIAXone (ROCEPHIN) 1 g in sodium chloride 0.9 % 100 mL IVPB     1 g 200 mL/hr over 30 Minutes Intravenous  Once 11/04/19 2339 11/05/19 0102   11/04/19 2345  doxycycline (VIBRA-TABS) tablet 100 mg     100 mg Oral  Once 11/04/19 2339 11/05/19 0032      Subjective:  Complains of nasal congestion  Objective: Vitals:   11/08/19 0900 11/08/19 2312 11/09/19 0758 11/09/19 1253  BP: (!) 114/43 100/61 107/82 (!) 98/46  Pulse: 65 72 78 89  Resp: 18 18 20 18   Temp: (!) 97.5 F  (36.4 C) 97.9 F (36.6 C) (!) 97.3 F (36.3 C) 98.7 F (37.1 C)  TempSrc: Oral Oral    SpO2: 94% 94% 97% 91%  Weight:      Height:        Intake/Output Summary (Last 24 hours) at 11/09/2019 1559 Last data filed at 11/09/2019 0900 Gross per 24 hour  Intake --  Output 250 ml  Net -250 ml   Filed Weights   11/04/19 2136 11/05/19 0247 11/06/19 0500  Weight: 70.8 kg 71.2 kg 70.9 kg    Examination: General exam: Conversant, in no acute distress Respiratory system: normal chest rise, clear, no audible wheezing Cardiovascular system: regular rhythm, s1-s2 Gastrointestinal system: Nondistended, nontender, pos BS Central nervous system: No seizures, no tremors Extremities: No cyanosis, no joint deformities Skin: No rashes, no pallor Psychiatry: Affect normal // no auditory hallucinations   Data Reviewed: I have personally reviewed following labs and imaging studies  CBC: Recent Labs  Lab 11/04/19 2146 11/04/19 2146 11/05/19 0112 11/06/19 0228 11/07/19 0315 11/08/19 0313 11/09/19 0317  WBC 11.7*   < > 8.9 9.0 8.3 7.4 8.1  NEUTROABS 7.6  --   --  5.9 5.2 4.0 4.3  HGB 10.3*   < > 10.4* 10.5* 10.9* 10.9* 11.9*  HCT 33.7*   < > 33.2* 33.4* 34.9* 34.9* 38.3  MCV 92.8   < > 93.5 91.3 91.1 92.3 92.7  PLT 407*   < > 373 365 388 397 389   < > = values in this interval not displayed.   Basic Metabolic Panel: Recent Labs  Lab 11/04/19 2146 11/06/19 0228 11/07/19 0315 11/08/19 0313 11/09/19 0317  NA 133* 136 138 137 137  K 4.6 4.2 4.4 4.2 4.6  CL 105 104 108 107 105  CO2 22 20* 22 23 24   GLUCOSE 100* 109* 113* 110* 115*  BUN 18 19 20  24* 22  CREATININE 1.18* 1.21* 1.15* 1.23* 1.21*  CALCIUM 11.0* 10.8* 11.3* 10.9* 11.2*   GFR: Estimated Creatinine Clearance: 28.6 mL/min (A) (by C-G formula based on SCr of 1.21 mg/dL (H)). Liver Function Tests: No results for input(s): AST, ALT, ALKPHOS, BILITOT, PROT, ALBUMIN in the last 168 hours. No results for input(s): LIPASE,  AMYLASE in the last 168 hours. No results for input(s): AMMONIA in the last 168 hours. Coagulation Profile: No results for input(s): INR, PROTIME in the last 168 hours. Cardiac Enzymes: No results for input(s): CKTOTAL, CKMB, CKMBINDEX, TROPONINI in the last 168 hours. BNP (last 3 results) No results for input(s): PROBNP in the last 8760 hours. HbA1C: No results for input(s): HGBA1C in the last 72 hours. CBG: Recent Labs  Lab 11/06/19 1128 11/07/19 0802 11/07/19 2132 11/08/19 1622 11/09/19 0722  GLUCAP 114* 85 117* 141* 81   Lipid Profile: No results for input(s): CHOL, HDL, LDLCALC, TRIG, CHOLHDL, LDLDIRECT in the last 72 hours. Thyroid Function Tests: No results for input(s): TSH, T4TOTAL, FREET4, T3FREE, THYROIDAB in the last 72 hours. Anemia Panel: No results for input(s): VITAMINB12, FOLATE, FERRITIN, TIBC, IRON, RETICCTPCT in the last 72 hours. Sepsis Labs: Recent Labs  Lab 11/05/19 0109  PROCALCITON <0.10    Recent Results (from the past 240 hour(s))  SARS CORONAVIRUS 2 (TAT 6-24 HRS) Nasopharyngeal Nasopharyngeal Swab  Status: None   Collection Time: 11/05/19 12:01 AM   Specimen: Nasopharyngeal Swab  Result Value Ref Range Status   SARS Coronavirus 2 NEGATIVE NEGATIVE Final    Comment: (NOTE) SARS-CoV-2 target nucleic acids are NOT DETECTED. The SARS-CoV-2 RNA is generally detectable in upper and lower respiratory specimens during the acute phase of infection. Negative results do not preclude SARS-CoV-2 infection, do not rule out co-infections with other pathogens, and should not be used as the sole basis for treatment or other patient management decisions. Negative results must be combined with clinical observations, patient history, and epidemiological information. The expected result is Negative. Fact Sheet for Patients: SugarRoll.be Fact Sheet for Healthcare Providers: https://www.woods-mathews.com/ This test  is not yet approved or cleared by the [place] FDA and  has been authorized for detection and/or diagnosis of SARS-CoV-2 by FDA under an Emergency Use Authorization (EUA). This EUA will remain  in effect (meaning this test can be used) for the duration of the COVID-19 declaration under Section 56 4(b)(1) of the Act, 21 U.S.C. section 360bbb-3(b)(1), unless the authorization is terminated or revoked sooner. Performed at [facility], [place] [address] [place], [place] [address]      Radiology Studies: DG CHEST PORT 1 VIEW  Result Date: 11/09/2019 CLINICAL DATA:  86 year old female with shortness of breath EXAM: PORTABLE CHEST 1 VIEW COMPARISON:  Chest radiograph dated 11/06/2019. FINDINGS: Bilateral pulmonary nodular opacities with greater involvement of the left lung similar or minimally progressed since the prior radiograph and concerning for atypical pneumonia. Clinical correlation is recommended. No lobar consolidation, pleural effusion, pneumothorax. Stable cardiomediastinal silhouette. Atherosclerotic calcification of the aorta. Degenerative changes of the spine. No acute osseous pathology. IMPRESSION: Similar or minimally increased bilateral pulmonary airspace nodular opacities. Continued follow-up recommended. Electronically Signed   By: Anner Crete M.D.   On: 11/09/2019 15:35    Scheduled Meds: . apixaban  5 mg Oral BID  . clopidogrel  75 mg Oral Daily  . docusate sodium  100 mg Oral Daily  . doxycycline  100 mg Oral Q12H  . feeding supplement (ENSURE ENLIVE)  237 mL Oral BID BM  . fluticasone  1 spray Each Nare Daily  . furosemide  20 mg Oral Daily  . gabapentin  300 mg Oral BID  . insulin aspart  0-5 Units Subcutaneous QHS  . insulin aspart  0-9 Units Subcutaneous TID WC  . levothyroxine  75 mcg Oral Daily  . pantoprazole  40 mg Oral Daily  . prenatal multivitamin  1 tablet Oral Q1200  . sodium chloride flush  3 mL Intravenous Q12H  . valACYclovir  500 mg Oral  BID   Continuous Infusions: . sodium chloride    . cefTRIAXone (ROCEPHIN)  IV 1 g (11/08/19 2144)     LOS: 4 days   Marylu Lund, MD [facility] Pager On Amion  If 7PM-7AM, please contact night-coverage 11/09/2019, 3:59 PM

## 2019-11-10 LAB — BASIC METABOLIC PANEL
Anion gap: 10 (ref 5–15)
BUN: 26 mg/dL — ABNORMAL HIGH (ref 8–23)
CO2: 24 mmol/L (ref 22–32)
Calcium: 11 mg/dL — ABNORMAL HIGH (ref 8.9–10.3)
Chloride: 103 mmol/L (ref 98–111)
Creatinine, Ser: 1.23 mg/dL — ABNORMAL HIGH (ref 0.44–1.00)
GFR calc Af Amer: 46 mL/min — ABNORMAL LOW (ref >60)
GFR calc non Af Amer: 40 mL/min — ABNORMAL LOW (ref >60)
Glucose, Bld: 106 mg/dL — ABNORMAL HIGH (ref 70–99)
Potassium: 4.3 mmol/L (ref 3.5–5.1)
Sodium: 137 mmol/L (ref 135–145)

## 2019-11-10 LAB — CBC WITH DIFFERENTIAL/PLATELET
Abs Immature Granulocytes: 0.03 10*3/uL (ref 0.00–0.07)
Basophils Absolute: 0.1 10*3/uL (ref 0.0–0.1)
Basophils Relative: 1 %
Eosinophils Absolute: 1.1 10*3/uL — ABNORMAL HIGH (ref 0.0–0.5)
Eosinophils Relative: 13 %
HCT: 34.9 % — ABNORMAL LOW (ref 36.0–46.0)
Hemoglobin: 10.8 g/dL — ABNORMAL LOW (ref 12.0–15.0)
Immature Granulocytes: 0 %
Lymphocytes Relative: 24 %
Lymphs Abs: 2.2 10*3/uL (ref 0.7–4.0)
MCH: 28.5 pg (ref 26.0–34.0)
MCHC: 30.9 g/dL (ref 30.0–36.0)
MCV: 92.1 fL (ref 80.0–100.0)
Monocytes Absolute: 0.8 10*3/uL (ref 0.1–1.0)
Monocytes Relative: 9 %
Neutro Abs: 4.9 10*3/uL (ref 1.7–7.7)
Neutrophils Relative %: 53 %
Platelets: 363 10*3/uL (ref 150–400)
RBC: 3.79 MIL/uL — ABNORMAL LOW (ref 3.87–5.11)
RDW: 17.8 % — ABNORMAL HIGH (ref 11.5–15.5)
WBC: 9.1 10*3/uL (ref 4.0–10.5)
nRBC: 0 % (ref 0.0–0.2)

## 2019-11-10 LAB — GLUCOSE, CAPILLARY: Glucose-Capillary: 83 mg/dL (ref 70–99)

## 2019-11-10 MED ORDER — HYDROXYZINE HCL 10 MG PO TABS: 10.0000 mg | ORAL_TABLET | Freq: Three times a day (TID) | ORAL | 0 refills | Status: AC | PRN

## 2019-11-10 MED ORDER — OXYCODONE HCL 10 MG PO TABS: 10.0000 mg | ORAL_TABLET | Freq: Four times a day (QID) | ORAL | 0 refills | Status: AC | PRN

## 2019-11-10 MED ORDER — FUROSEMIDE 20 MG PO TABS: 20.0000 mg | ORAL_TABLET | Freq: Every day | ORAL | 0 refills | Status: AC

## 2019-11-10 MED ORDER — LACTULOSE 10 GM/15ML PO SOLN
20.0000 g | ORAL | Status: DC
  Filled 2019-11-10: qty 30

## 2019-11-10 MED ORDER — BISACODYL 10 MG RE SUPP
10.0000 mg | RECTAL | Status: DC
  Filled 2019-11-10: qty 1

## 2019-11-10 MED ORDER — PANTOPRAZOLE SODIUM 40 MG PO TBEC: 40.0000 mg | DELAYED_RELEASE_TABLET | Freq: Every day | ORAL | 0 refills | Status: DC

## 2019-11-10 NOTE — Discharge Summary (Signed)
Physician Discharge Summary  Kristine Oliver QPR:[id number] DOB: 10-Feb-1931 DOA: 11/04/2019  PCP: Lavone Orn, MD  Admit date: 11/04/2019 Discharge date: 11/10/2019  Admitted From: Home Disposition:  SNF  Recommendations for Outpatient Follow-up:  1. Follow up with PCP in 1-2 weeks 2. Recommend Palliative Care referral at facility 3. Please ensure regular bowel movements  Discharge Condition:Stable CODE STATUS:DNR Diet recommendation: Diabetic, heart healthy   Brief/Interim Summary: 86yo with hx CAD s/p PCI, chronic diastolic CHF, persistent Afib on eliquis, CKD 3-4, DM2, htn, hld, hypothyroid presenting with acute hypoxemic failure secondary to PNA and acute chf  Discharge Diagnoses:  Principal Problem:   Acute respiratory failure ([place]) Active Problems:   CAD (coronary artery disease)   Weakness generalized   Essential hypertension   Acute on chronic diastolic CHF (congestive heart failure) (HCC)   Syncope   Adjustment disorder with depressed mood   Palliative care by specialist   DNR (do not resuscitate)   Acute hypoxic respiratory failure likely 2/2 acute on chronic diastolic HF/CAP Currently afebrile, with no leukocytosis Remains on 2 L via nasal cannula, cont to wean O2 as tolerated BNP 320, troponin negative COVID-19 virus negative, procalcitonin negative Chest x-ray showed diffuse interstitial infiltrates concerning for atypical infection, edema, chronic interstitial lung disease D-dimer mildly elevated, CTA chest showed no PE, diffuse bilateral groundglass airspace opacity suspicious for developing pulmonary edema versus multifocal pneumonia Echo with EF of65 to 70%, no regional wall motion abnormality, unable to evaluate diastolic function due to persistent A. fib Completed course of rocephin Continue with lasix as tolerated  Ordered and reviewed CXR on 4/8, appears stable  Recurrent falls/?syncope Head CT unremarkable Therapy recs for SNF noted. COVID test is  neg  CAD status post stents Currently chest pain-free Continue Plavix as pt tolerates  Diabetes mellitus type 2 Last A1c 6.5 SSI, Accu-Cheks, hypoglycemic protocol Glycemic trends reviewed, remains stable at this time  Persistent A. Fib Status post cardioversion in 08/2018 Currently in A. fib, rate controlled Continue Eliquis as pt tolerates  Hypertension BP soft Currently holding home amlodipine, imdur, ramipril  CKD stage III Creatinine currently at baseline Repeat bmet in AM  Hypothyroidism Continue Synthroid as pt tolerates  Obesity Recommend diet/lifestyle modification  Possible depression/goals of care discussion/ Psychiatry and palliative care have been consulted-patient with histrionic behavior, no intent to harm herself, according to family, her usual behavior.Possible undiagnosed bipolar disorder as per psychiatrist Not meeting criteria currently for hospice Recommend outpatient palliative follow-up  Discharge Instructions   Allergies as of 11/10/2019      Reactions   Pravastatin Other (See Comments)   Muscle aches   Prednisone Nausea And Vomiting      Medication List    STOP taking these medications   amLODipine 2.5 MG tablet Commonly known as: NORVASC   isosorbide mononitrate 30 MG 24 hr tablet Commonly known as: IMDUR   ramipril 5 MG capsule Commonly known as: ALTACE     TAKE these medications   albuterol 108 (90 Base) MCG/ACT inhaler Commonly known as: VENTOLIN HFA Inhale 1 puff into the lungs every 4 (four) hours as needed for wheezing or shortness of breath.   apixaban 2.5 MG Tabs tablet Commonly known as: ELIQUIS Take 1 tablet (2.5 mg total) by mouth 2 (two) times daily.   ASPERCREME LIDOCAINE EX Apply 1 application topically 2 (two) times daily as needed (back/feet pain).   clindamycin 1 % lotion Commonly known as: CLEOCIN T Apply 1 application topically 2 (two) times daily. Facial  wash   clopidogrel 75 MG  tablet Commonly known as: PLAVIX Take 1 tablet (75 mg total) by mouth daily.   cycloSPORINE 0.05 % ophthalmic emulsion Commonly known as: RESTASIS Place 1 drop into both eyes 2 (two) times daily as needed (dry eyes).   docusate sodium 100 MG capsule Commonly known as: COLACE Take 100 mg by mouth daily.   furosemide 20 MG tablet Commonly known as: LASIX Take 1 tablet (20 mg total) by mouth daily. Start taking on: November 11, 2019 What changed:   how much to take  how to take this  when to take this  additional instructions   gabapentin 300 MG capsule Commonly known as: NEURONTIN Take 300 mg by mouth 2 (two) times daily.   hydrOXYzine 10 MG tablet Commonly known as: ATARAX/VISTARIL Take 1 tablet (10 mg total) by mouth 3 (three) times daily as needed for anxiety.   levothyroxine 75 MCG tablet Commonly known as: SYNTHROID Take 75 mcg by mouth daily.   melatonin 3 MG Tabs tablet Take 3 mg by mouth at bedtime as needed (sleep).   metFORMIN 500 MG tablet Commonly known as: GLUCOPHAGE Take 500 mg by mouth 2 (two) times daily with a meal.   nitroGLYCERIN 0.4 MG SL tablet Commonly known as: NITROSTAT DISSOLVE 1 TABLET UNDER THE TONGUE EVERY 5 MINUTES FOR 3 DOSES AS NEEDED FOR CHEST PAIN What changed: See the new instructions.   Oxycodone HCl 10 MG Tabs Take 1 tablet (10 mg total) by mouth every 6 (six) hours as needed for moderate pain. Prescription needed for SNF placement What changed:   when to take this  reasons to take this  additional instructions  Another medication with the same name was removed. Continue taking this medication, and follow the directions you see here.   pantoprazole 40 MG tablet Commonly known as: PROTONIX Take 1 tablet (40 mg total) by mouth daily. Start taking on: November 11, 2019 What changed: See the new instructions.   rosuvastatin 20 MG tablet Commonly known as: CRESTOR TAKE 1 TABLET(20 MG) BY MOUTH DAILY AT 6 PM What changed:  See the new instructions.   sodium chloride 0.65 % Soln nasal spray Commonly known as: OCEAN Place 1 spray into both nostrils as needed for congestion (dryness).   valACYclovir 500 MG tablet Commonly known as: VALTREX Take 500 mg by mouth 2 (two) times daily.   vitamin B-12 500 MCG tablet Commonly known as: CYANOCOBALAMIN Take 500 mcg by mouth daily.      Contact information for after-discharge care    Destination    [facility] .   Service: Skilled Nursing Contact information: [place] [address] [phone]             Allergies  Allergen Reactions  . Pravastatin Other (See Comments)    Muscle aches  . Prednisone Nausea And Vomiting    Consultations:  Psychiatry  Palliative care  Procedures/Studies: CT HEAD WO CONTRAST  Result Date: 11/05/2019 CLINICAL DATA:  Recent syncopal episode EXAM: CT HEAD WITHOUT CONTRAST TECHNIQUE: Contiguous axial images were obtained from the base of the skull through the vertex without intravenous contrast. COMPARISON:  01/31/2008 FINDINGS: Brain: Atrophic changes are noted. No findings to suggest acute hemorrhage, acute infarction or space-occupying mass lesion are noted. 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 Vascular: No hyperdense vessel or unexpected calcification. Skull: Normal. Negative for fracture or focal lesion. Sinuses/Orbits: No acute finding. Other: None IMPRESSION: Chronic atrophic and  ischemic changes without acute abnormality. Electronically Signed   By: Inez Catalina M.D.   On: 11/05/2019 01:59   CT ANGIO CHEST PE W OR WO CONTRAST  Result Date: 11/05/2019 CLINICAL DATA:  Shortness of breath.  PE suspected. EXAM: CT ANGIOGRAPHY CHEST WITH CONTRAST TECHNIQUE: Multidetector CT imaging of the chest was performed using the standard protocol during bolus administration of intravenous contrast. Multiplanar CT image reconstructions and MIPs were obtained to evaluate the  vascular anatomy. CONTRAST:  115mL OMNIPAQUE IOHEXOL 350 MG/ML SOLN COMPARISON:  None. FINDINGS: Cardiovascular: Contrast injection is sufficient to demonstrate satisfactory opacification of the pulmonary arteries to the segmental level. There is no pulmonary embolus. The main pulmonary artery is within normal limits for size. There is no CT evidence of acute right heart strain. The visualized aorta is normal. Heart size is borderline enlarged. Coronary artery calcifications are noted. Mediastinum/Nodes: --mediastinal and hilar adenopathy is noted. --No axillary lymphadenopathy. --No supraclavicular lymphadenopathy. --there is a left-sided thyroid nodule measuring approximately 2.7 cm. --The esophagus is unremarkable Lungs/Pleura: There are diffuse bilateral ground-glass airspace opacities. There is some interlobular septal thickening. There is scattered areas of consolidation bilaterally. There is no significant pleural effusion. There is no pneumothorax. The trachea is unremarkable. The lung volumes are low. There is scattered areas of bronchial wall thickening and mucus plugging. Upper Abdomen: No acute abnormality. Musculoskeletal: No chest wall abnormality. No acute or significant osseous findings. Review of the MIP images confirms the above findings. IMPRESSION: 1. No acute pulmonary embolism. 2. Diffuse bilateral ground-glass airspace opacities with mild interlobular septal thickening is suspicious for developing pulmonary edema. An atypical infectious process is not excluded. 3. Scattered areas of consolidation bilaterally concerning for multifocal pneumonia or infectious bronchiolitis. 4. There is a 2.7 cm left-sided thyroid nodule. Outpatient nonemergent follow-up ultrasound is recommended.(Ref: J Am Coll Radiol. 2015 Feb;12(2): 143-50). Aortic Atherosclerosis (ICD10-I70.0). Electronically Signed   By: Constance Holster M.D.   On: 11/05/2019 03:59   DG CHEST PORT 1 VIEW  Result Date:  11/09/2019 CLINICAL DATA:  86 year old female with shortness of breath EXAM: PORTABLE CHEST 1 VIEW COMPARISON:  Chest radiograph dated 11/06/2019. FINDINGS: Bilateral pulmonary nodular opacities with greater involvement of the left lung similar or minimally progressed since the prior radiograph and concerning for atypical pneumonia. Clinical correlation is recommended. No lobar consolidation, pleural effusion, pneumothorax. Stable cardiomediastinal silhouette. Atherosclerotic calcification of the aorta. Degenerative changes of the spine. No acute osseous pathology. IMPRESSION: Similar or minimally increased bilateral pulmonary airspace nodular opacities. Continued follow-up recommended. Electronically Signed   By: Anner Crete M.D.   On: 11/09/2019 15:35   DG Chest Port 1 View  Result Date: 11/06/2019 CLINICAL DATA:  Shortness of breath. EXAM: PORTABLE CHEST 1 VIEW COMPARISON:  11/04/2019 FINDINGS: The cardiac silhouette, mediastinal and hilar contours are stable. Coronary artery stents are noted. Persistent diffuse asymmetric interstitial and airspace process in the lungs with diffuse ground-glass type opacities suspicious for atypical/viral pneumonia. No definite pleural effusions. IMPRESSION: Persistent interstitial and airspace process suspicious for atypical/viral pneumonia. Electronically Signed   By: Marijo Sanes M.D.   On: 11/06/2019 07:49   DG Chest Port 1 View  Result Date: 11/04/2019 CLINICAL DATA:  Shortness of breath worsening in past couple days, decreased oxygen saturation in the mid to upper 80s EXAM: PORTABLE CHEST 1 VIEW COMPARISON:  Portable exam 2203 hours compared to 10/09/2019 FINDINGS: Normal heart size post coronary stenting. Stable mediastinal contours. Diffuse interstitial infiltrates throughout both lungs, similar to prior exam, at question  pulmonary edema or atypical infection, chronic interstitial lung disease not excluded. No pleural effusion or pneumothorax. Scattered  endplate spur formation thoracic spine. IMPRESSION: Diffuse interstitial infiltrates unchanged, question atypical infection, edema or chronic interstitial lung disease. Electronically Signed   By: Lavonia Dana M.D.   On: 11/04/2019 22:10   ECHOCARDIOGRAM COMPLETE  Result Date: 11/05/2019    ECHOCARDIOGRAM REPORT   Patient Name:   Kristine Oliver Date of Exam: 11/05/2019 Medical Rec #:  [id number]       Height:       59.0 in Accession #:    [id number]      Weight:       157.0 lb Date of Birth:  February 20, 1931        BSA:          1.664 m Patient Age:    86 years        BP:           123/60 mmHg Patient Gender: F               HR:           92 bpm. Exam Location:  Inpatient Procedure: 2D Echo, Color Doppler and Cardiac Doppler Indications:    G99.24 Acute diastolic (congestive) heart failure  History:        Patient has prior history of Echocardiogram examinations, most                 recent 03/19/2018. CHF, CAD; Risk Factors:Hypertension, Diabetes                 and Dyslipidemia.  Sonographer:    Raquel Sarna Senior RDCS Referring Phys: [id number] [place]  1. Left ventricular ejection fraction, by estimation, is 65 to 70%. The left ventricle has normal function. The left ventricle has no regional wall motion abnormalities. Left ventricular diastolic function could not be evaluated.  2. Right ventricular systolic function is normal. The right ventricular size is mildly enlarged. There is normal pulmonary artery systolic pressure. The estimated right ventricular systolic pressure is 62.2 mmHg.  3. Left atrial size was moderately dilated.  4. Right atrial size was mildly dilated.  5. The mitral valve is normal in structure. Mild mitral valve regurgitation.  6. The aortic valve is normal in structure. Aortic valve regurgitation is not visualized. Mild aortic valve sclerosis is present, with no evidence of aortic valve stenosis. Comparison(s): Prior images reviewed side by side. Rhythm is now atrial fibrillation,  otherwise findings are similar to 2019. FINDINGS  Left Ventricle: Left ventricular ejection fraction, by estimation, is 65 to 70%. The left ventricle has normal function. The left ventricle has no regional wall motion abnormalities. The left ventricular internal cavity size was normal in size. There is  borderline concentric left ventricular hypertrophy. Left ventricular diastolic function could not be evaluated due to atrial fibrillation. Left ventricular diastolic function could not be evaluated. Right Ventricle: The right ventricular size is mildly enlarged. No increase in right ventricular wall thickness. Right ventricular systolic function is normal. There is normal pulmonary artery systolic pressure. The tricuspid regurgitant velocity is 2.23  m/s, and with an assumed right atrial pressure of 3 mmHg, the estimated right ventricular systolic pressure is 29.7 mmHg. Left Atrium: Left atrial size was moderately dilated. Right Atrium: Right atrial size was mildly dilated. Pericardium: There is no evidence of pericardial effusion. Mitral Valve: The mitral valve is normal in structure. Mild mitral annular calcification. Mild mitral valve regurgitation. Tricuspid Valve:  The tricuspid valve is normal in structure. Tricuspid valve regurgitation is mild. Aortic Valve: The aortic valve is normal in structure. Aortic valve regurgitation is not visualized. Mild aortic valve sclerosis is present, with no evidence of aortic valve stenosis. Pulmonic Valve: The pulmonic valve was normal in structure. Pulmonic valve regurgitation is not visualized. Aorta: The aortic root is normal in size and structure. IAS/Shunts: No atrial level shunt detected by color flow Doppler.  LEFT VENTRICLE PLAX 2D LVIDd:         3.60 cm LVIDs:         2.50 cm LV PW:         1.10 cm LV IVS:        1.20 cm  RIGHT VENTRICLE RV S prime:     11.90 cm/s TAPSE (M-mode): 1.9 cm LEFT ATRIUM             Index       RIGHT ATRIUM           Index LA diam:         4.70 cm 2.82 cm/m  RA Area:     19.20 cm LA Vol (A2C):   68.1 ml 40.93 ml/m RA Volume:   56.90 ml  34.20 ml/m LA Vol (A4C):   71.4 ml 42.91 ml/m LA Biplane Vol: 69.6 ml 41.83 ml/m  AORTIC VALVE LVOT Vmax:   81.60 cm/s LVOT Vmean:  63.800 cm/s LVOT VTI:    0.188 m  AORTA Ao Root diam: 2.80 cm Ao Asc diam:  3.10 cm TRICUSPID VALVE TR Peak grad:   19.9 mmHg TR Vmax:        223.00 cm/s  SHUNTS Systemic VTI: 0.19 m Mihai Croitoru MD Electronically signed by Sanda Klein MD Signature Date/Time: 11/05/2019/11:44:26 AM    Final      Subjective: Feeling constipated this AM, later able to have a large bowel movement  Discharge Exam: Vitals:   11/09/19 2217 11/10/19 0823  BP: (!) 111/45 (!) 90/43  Pulse: 67 67  Resp:  18  Temp: 98.3 F (36.8 C) 98.3 F (36.8 C)  SpO2: 96% 99%   Vitals:   11/09/19 1603 11/09/19 1900 11/09/19 2217 11/10/19 0823  BP: 96/64  (!) 111/45 (!) 90/43  Pulse: 70  67 67  Resp: 19   18  Temp: 98 F (36.7 C)  98.3 F (36.8 C) 98.3 F (36.8 C)  TempSrc:   Oral   SpO2: 96% 96% 96% 99%  Weight:      Height:        General: Pt is alert, awake, not in acute distress Cardiovascular: RRR, S1/S2 +, no rubs, no gallops Respiratory: CTA bilaterally, no wheezing, no rhonchi Abdominal: Soft, NT, ND, bowel sounds + Extremities: no edema, no cyanosis   The results of significant diagnostics from this hospitalization (including imaging, microbiology, ancillary and laboratory) are listed below for reference.     Microbiology: Recent Results (from the past 240 hour(s))  SARS CORONAVIRUS 2 (TAT 6-24 HRS) Nasopharyngeal Nasopharyngeal Swab     Status: None   Collection Time: 11/05/19 12:01 AM   Specimen: Nasopharyngeal Swab  Result Value Ref Range Status   SARS Coronavirus 2 NEGATIVE NEGATIVE Final    Comment: (NOTE) SARS-CoV-2 target nucleic acids are NOT DETECTED. The SARS-CoV-2 RNA is generally detectable in upper and lower respiratory specimens during the acute  phase of infection. Negative results do not preclude SARS-CoV-2 infection, do not rule out co-infections with other pathogens, and should not  be used as the sole basis for treatment or other patient management decisions. Negative results must be combined with clinical observations, patient history, and epidemiological information. The expected result is Negative. Fact Sheet for Patients: SugarRoll.be Fact Sheet for Healthcare Providers: https://www.woods-mathews.com/ This test is not yet approved or cleared by the [place] FDA and  has been authorized for detection and/or diagnosis of SARS-CoV-2 by FDA under an Emergency Use Authorization (EUA). This EUA will remain  in effect (meaning this test can be used) for the duration of the COVID-19 declaration under Section 56 4(b)(1) of the Act, 21 U.S.C. section 360bbb-3(b)(1), unless the authorization is terminated or revoked sooner. Performed at [facility], [place] [address], [place], [place] [address]   SARS CORONAVIRUS 2 (TAT 6-24 HRS) Nasopharyngeal Nasopharyngeal Swab     Status: None   Collection Time: 11/09/19 10:00 AM   Specimen: Nasopharyngeal Swab  Result Value Ref Range Status   SARS Coronavirus 2 NEGATIVE NEGATIVE Final    Comment: (NOTE) SARS-CoV-2 target nucleic acids are NOT DETECTED. The SARS-CoV-2 RNA is generally detectable in upper and lower respiratory specimens during the acute phase of infection. Negative results do not preclude SARS-CoV-2 infection, do not rule out co-infections with other pathogens, and should not be used as the sole basis for treatment or other patient management decisions. Negative results must be combined with clinical observations, patient history, and epidemiological information. The expected result is Negative. Fact Sheet for Patients: SugarRoll.be Fact Sheet for Healthcare  Providers: https://www.woods-mathews.com/ This test is not yet approved or cleared by the [place] FDA and  has been authorized for detection and/or diagnosis of SARS-CoV-2 by FDA under an Emergency Use Authorization (EUA). This EUA will remain  in effect (meaning this test can be used) for the duration of the COVID-19 declaration under Section 56 4(b)(1) of the Act, 21 U.S.C. section 360bbb-3(b)(1), unless the authorization is terminated or revoked sooner. Performed at [facility], [place] [address], [place], [place] [address]      Labs: BNP (last 3 results) Recent Labs    03/10/19 1829 10/09/19 1636 11/04/19 2146  BNP 376.4* 247.9* 283.6*   Basic Metabolic Panel: Recent Labs  Lab 11/06/19 0228 11/07/19 0315 11/08/19 0313 11/09/19 0317 11/10/19 0107  NA 136 138 137 137 137  K 4.2 4.4 4.2 4.6 4.3  CL 104 108 107 105 103  CO2 20* 22 23 24 24   GLUCOSE 109* 113* 110* 115* 106*  BUN 19 20 24* 22 26*  CREATININE 1.21* 1.15* 1.23* 1.21* 1.23*  CALCIUM 10.8* 11.3* 10.9* 11.2* 11.0*   Liver Function Tests: No results for input(s): AST, ALT, ALKPHOS, BILITOT, PROT, ALBUMIN in the last 168 hours. No results for input(s): LIPASE, AMYLASE in the last 168 hours. No results for input(s): AMMONIA in the last 168 hours. CBC: Recent Labs  Lab 11/06/19 0228 11/07/19 0315 11/08/19 0313 11/09/19 0317 11/10/19 0107  WBC 9.0 8.3 7.4 8.1 9.1  NEUTROABS 5.9 5.2 4.0 4.3 4.9  HGB 10.5* 10.9* 10.9* 11.9* 10.8*  HCT 33.4* 34.9* 34.9* 38.3 34.9*  MCV 91.3 91.1 92.3 92.7 92.1  PLT 365 388 397 389 363   Cardiac Enzymes: No results for input(s): CKTOTAL, CKMB, CKMBINDEX, TROPONINI in the last 168 hours. BNP: Invalid input(s): POCBNP CBG: Recent Labs  Lab 11/07/19 0802 11/07/19 2132 11/08/19 1622 11/09/19 0722 11/10/19 0712  GLUCAP 85 117* 141* 81 83   D-Dimer No results for input(s): DDIMER in the last 72 hours. Hgb  A1c No results for input(s): HGBA1C  in the last 72 hours. Lipid Profile No results for input(s): CHOL, HDL, LDLCALC, TRIG, CHOLHDL, LDLDIRECT in the last 72 hours. Thyroid function studies No results for input(s): TSH, T4TOTAL, T3FREE, THYROIDAB in the last 72 hours.  Invalid input(s): FREET3 Anemia work up No results for input(s): VITAMINB12, FOLATE, FERRITIN, TIBC, IRON, RETICCTPCT in the last 72 hours. Urinalysis    Component Value Date/Time   COLORURINE YELLOW 03/10/2019 1749   APPEARANCEUR HAZY (A) 03/10/2019 1749   LABSPEC 1.008 03/10/2019 1749   PHURINE 6.0 03/10/2019 1749   GLUCOSEU NEGATIVE 03/10/2019 1749   HGBUR SMALL (A) 03/10/2019 1749   BILIRUBINUR NEGATIVE 03/10/2019 1749   KETONESUR NEGATIVE 03/10/2019 1749   PROTEINUR NEGATIVE 03/10/2019 1749   UROBILINOGEN 0.2 05/01/2012 1913   NITRITE NEGATIVE 03/10/2019 1749   LEUKOCYTESUR LARGE (A) 03/10/2019 1749   Sepsis Labs Invalid input(s): PROCALCITONIN,  WBC,  LACTICIDVEN Microbiology Recent Results (from the past 240 hour(s))  SARS CORONAVIRUS 2 (TAT 6-24 HRS) Nasopharyngeal Nasopharyngeal Swab     Status: None   Collection Time: 11/05/19 12:01 AM   Specimen: Nasopharyngeal Swab  Result Value Ref Range Status   SARS Coronavirus 2 NEGATIVE NEGATIVE Final    Comment: (NOTE) SARS-CoV-2 target nucleic acids are NOT DETECTED. The SARS-CoV-2 RNA is generally detectable in upper and lower respiratory specimens during the acute phase of infection. Negative results do not preclude SARS-CoV-2 infection, do not rule out co-infections with other pathogens, and should not be used as the sole basis for treatment or other patient management decisions. Negative results must be combined with clinical observations, patient history, and epidemiological information. The expected result is Negative. Fact Sheet for Patients: SugarRoll.be Fact Sheet for Healthcare Providers: https://www.woods-mathews.com/ This test is not yet  approved or cleared by the [place] FDA and  has been authorized for detection and/or diagnosis of SARS-CoV-2 by FDA under an Emergency Use Authorization (EUA). This EUA will remain  in effect (meaning this test can be used) for the duration of the COVID-19 declaration under Section 56 4(b)(1) of the Act, 21 U.S.C. section 360bbb-3(b)(1), unless the authorization is terminated or revoked sooner. Performed at [facility], [place] [address] [place], [place] [address]   SARS CORONAVIRUS 2 (TAT 6-24 HRS) Nasopharyngeal Nasopharyngeal Swab     Status: None   Collection Time: 11/09/19 10:00 AM   Specimen: Nasopharyngeal Swab  Result Value Ref Range Status   SARS Coronavirus 2 NEGATIVE NEGATIVE Final    Comment: (NOTE) SARS-CoV-2 target nucleic acids are NOT DETECTED. The SARS-CoV-2 RNA is generally detectable in upper and lower respiratory specimens during the acute phase of infection. Negative results do not preclude SARS-CoV-2 infection, do not rule out co-infections with other pathogens, and should not be used as the sole basis for treatment or other patient management decisions. Negative results must be combined with clinical observations, patient history, and epidemiological information. The expected result is Negative. Fact Sheet for Patients: SugarRoll.be Fact Sheet for Healthcare Providers: https://www.woods-mathews.com/ This test is not yet approved or cleared by the [place] FDA and  has been authorized for detection and/or diagnosis of SARS-CoV-2 by FDA under an Emergency Use Authorization (EUA). This EUA will remain  in effect (meaning this test can be used) for the duration of the COVID-19 declaration under Section 56 4(b)(1) of the Act, 21 U.S.C. section 360bbb-3(b)(1), unless the authorization is terminated or revoked sooner. Performed at [facility], [place],  [place] [address]    Time  spent: 30 min  SIGNED:   Marylu Lund, MD  [facility] 11/10/2019, 11:01 AM  If 7PM-7AM, please contact night-coverage

## 2019-11-10 NOTE — TOC Transition Note (Addendum)
Transition of Care [facility]) - CM/SW Discharge Note   Patient Details  Name: Kristine Oliver MRN: [id number] Date of Birth: 21-Apr-1931  Transition of Care [facility]) CM/SW Contact:  Jacquelynn Cree Phone Number: 11/10/2019, 2:08 PM   Clinical Narrative:    Patient will DC to: [facility] Anticipated DC date: 11/10/2019 Family notified: Cammie Transport by: Corey Harold   Per MD patient ready for DC to [facility]. RN, patient, patient's family, and facility notified of DC. Discharge Summary and FL2 sent to facility. RN to call report prior to discharge ([place]). DC packet on chart. Ambulance transport requested for patient.   CSW will sign off for now as social work intervention is no longer needed. Please consult [place] again if new needs arise.      Barriers to Discharge: Continued Medical Work up   Patient Goals and CMS Choice Patient states their goals for this hospitalization and ongoing recovery are:: To go back home to my condo CMS Medicare.gov Compare Post Acute Care list provided to:: Patient Represenative (must comment)(Daughter in law, cammie) Choice offered to / list presented to : Patient, Adult Children  Discharge Placement                       Discharge Plan and Services                                     Social Determinants of Health (SDOH) Interventions     Readmission Risk Interventions No flowsheet data found.

## 2019-11-26 ENCOUNTER — Other Ambulatory Visit: Payer: Self-pay

## 2019-11-26 ENCOUNTER — Emergency Department (HOSPITAL_COMMUNITY): Payer: Medicare PPO

## 2019-11-26 ENCOUNTER — Emergency Department (HOSPITAL_COMMUNITY)
Admission: EM | Admit: 2019-11-26 | Discharge: 2019-11-27 | Disposition: A | Discharge status: Home / Self Care | Payer: Medicare PPO | Attending: Emergency Medicine | Admitting: Emergency Medicine

## 2019-11-26 ENCOUNTER — Encounter (HOSPITAL_COMMUNITY): Payer: Self-pay

## 2019-11-26 DIAGNOSIS — Z79899 Other long term (current) drug therapy: Secondary | ICD-10-CM | POA: Insufficient documentation

## 2019-11-26 DIAGNOSIS — I5032 Chronic diastolic (congestive) heart failure: Secondary | ICD-10-CM | POA: Insufficient documentation

## 2019-11-26 DIAGNOSIS — N183 Chronic kidney disease, stage 3 unspecified: Secondary | ICD-10-CM | POA: Insufficient documentation

## 2019-11-26 DIAGNOSIS — Z7901 Long term (current) use of anticoagulants: Secondary | ICD-10-CM | POA: Insufficient documentation

## 2019-11-26 DIAGNOSIS — Z7984 Long term (current) use of oral hypoglycemic drugs: Secondary | ICD-10-CM | POA: Diagnosis not present

## 2019-11-26 DIAGNOSIS — R0789 Other chest pain: Secondary | ICD-10-CM | POA: Insufficient documentation

## 2019-11-26 DIAGNOSIS — I2510 Atherosclerotic heart disease of native coronary artery without angina pectoris: Secondary | ICD-10-CM | POA: Insufficient documentation

## 2019-11-26 DIAGNOSIS — R1013 Epigastric pain: Secondary | ICD-10-CM | POA: Insufficient documentation

## 2019-11-26 DIAGNOSIS — I130 Hypertensive heart and chronic kidney disease with heart failure and stage 1 through stage 4 chronic kidney disease, or unspecified chronic kidney disease: Secondary | ICD-10-CM | POA: Insufficient documentation

## 2019-11-26 DIAGNOSIS — E1122 Type 2 diabetes mellitus with diabetic chronic kidney disease: Secondary | ICD-10-CM | POA: Diagnosis not present

## 2019-11-26 LAB — CBC WITH DIFFERENTIAL/PLATELET
Abs Immature Granulocytes: 0.04 10*3/uL (ref 0.00–0.07)
Basophils Absolute: 0.1 10*3/uL (ref 0.0–0.1)
Basophils Relative: 1 %
Eosinophils Absolute: 0.3 10*3/uL (ref 0.0–0.5)
Eosinophils Relative: 2 %
HCT: 37 % (ref 36.0–46.0)
Hemoglobin: 11.4 g/dL — ABNORMAL LOW (ref 12.0–15.0)
Immature Granulocytes: 0 %
Lymphocytes Relative: 11 %
Lymphs Abs: 1.3 10*3/uL (ref 0.7–4.0)
MCH: 29.7 pg (ref 26.0–34.0)
MCHC: 30.8 g/dL (ref 30.0–36.0)
MCV: 96.4 fL (ref 80.0–100.0)
Monocytes Absolute: 0.6 10*3/uL (ref 0.1–1.0)
Monocytes Relative: 5 %
Neutro Abs: 10 10*3/uL — ABNORMAL HIGH (ref 1.7–7.7)
Neutrophils Relative %: 81 %
Platelets: 287 10*3/uL (ref 150–400)
RBC: 3.84 MIL/uL — ABNORMAL LOW (ref 3.87–5.11)
RDW: 19.4 % — ABNORMAL HIGH (ref 11.5–15.5)
WBC: 12.3 10*3/uL — ABNORMAL HIGH (ref 4.0–10.5)
nRBC: 0 % (ref 0.0–0.2)

## 2019-11-26 LAB — TROPONIN I (HIGH SENSITIVITY)
Troponin I (High Sensitivity): 18 ng/L — ABNORMAL HIGH (ref <18)
Troponin I (High Sensitivity): 12 ng/L (ref <18)

## 2019-11-26 LAB — COMPREHENSIVE METABOLIC PANEL
ALT: 16 U/L (ref 0–44)
AST: 21 U/L (ref 15–41)
Albumin: 2.7 g/dL — ABNORMAL LOW (ref 3.5–5.0)
Alkaline Phosphatase: 136 U/L — ABNORMAL HIGH (ref 38–126)
Anion gap: 5 (ref 5–15)
BUN: 17 mg/dL (ref 8–23)
CO2: 24 mmol/L (ref 22–32)
Calcium: 11.2 mg/dL — ABNORMAL HIGH (ref 8.9–10.3)
Chloride: 107 mmol/L (ref 98–111)
Creatinine, Ser: 1.5 mg/dL — ABNORMAL HIGH (ref 0.44–1.00)
GFR calc Af Amer: 36 mL/min — ABNORMAL LOW (ref >60)
GFR calc non Af Amer: 31 mL/min — ABNORMAL LOW (ref >60)
Glucose, Bld: 105 mg/dL — ABNORMAL HIGH (ref 70–99)
Potassium: 5.1 mmol/L (ref 3.5–5.1)
Sodium: 136 mmol/L (ref 135–145)
Total Bilirubin: 0.4 mg/dL (ref 0.3–1.2)
Total Protein: 6.1 g/dL — ABNORMAL LOW (ref 6.5–8.1)

## 2019-11-26 LAB — I-STAT CREATININE, ED: Creatinine, Ser: 1.5 mg/dL — ABNORMAL HIGH (ref 0.44–1.00)

## 2019-11-26 LAB — LIPASE, BLOOD: Lipase: 66 U/L — ABNORMAL HIGH (ref 11–51)

## 2019-11-26 MED ORDER — ALUM & MAG HYDROXIDE-SIMETH 200-200-20 MG/5ML PO SUSP
30.0000 mL | Freq: Once | ORAL | Status: AC
  Administered 2019-11-26: 30 mL via ORAL
  Filled 2019-11-26: qty 30

## 2019-11-26 MED ORDER — ALBUTEROL SULFATE HFA 108 (90 BASE) MCG/ACT IN AERS
2.0000 | INHALATION_SPRAY | Freq: Once | RESPIRATORY_TRACT | Status: AC
  Administered 2019-11-26: 2 via RESPIRATORY_TRACT
  Filled 2019-11-26: qty 6.7

## 2019-11-26 MED ORDER — IOHEXOL 300 MG/ML  SOLN
80.0000 mL | Freq: Once | INTRAMUSCULAR | Status: AC | PRN
  Administered 2019-11-26: 80 mL via INTRAVENOUS

## 2019-11-26 MED ORDER — SODIUM CHLORIDE 0.9 % IV BOLUS
1000.0000 mL | Freq: Once | INTRAVENOUS | Status: AC
  Administered 2019-11-26: 23:00:00 1000 mL via INTRAVENOUS

## 2019-11-26 NOTE — ED Triage Notes (Signed)
To room via EMS from home.  Pt discharged from [facility] yesterday, pt states "I had too much fun last night". C/o abd and substernal chest pain.  Pt very talkative.  A&Ox4.  EMS  NP 90/36 HR 86 RR 16 on oxygen @ 2L via [place] Temp 98.2
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
## 2019-11-26 NOTE — ED Notes (Signed)
Jonni Sanger, nephew, [phone] would like an update when available

## 2019-11-27 NOTE — ED Provider Notes (Signed)
  Physical Exam  BP (!) 101/57 (BP Location: Right Arm)   Pulse 77   Temp 99.5 F (37.5 C) (Oral)   Resp 16   SpO2 97%   Physical Exam  ED Course/Procedures     Procedures  MDM  Patient has been medically cleared earlier.  Social work is seen patient now patient will be going back to where she was staying for.       Davonna Belling, MD 11/27/19 [phone]
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
## 2019-11-27 NOTE — ED Notes (Signed)
[facility] called for pt transport back to residence

## 2019-11-27 NOTE — ED Notes (Signed)
Pt complaining of angina pain and back pain on departure, per Dr Alvino Chapel take medication including Imdur at home.

## 2019-12-06 ENCOUNTER — Emergency Department (HOSPITAL_COMMUNITY)
Admission: EM | Admit: 2019-12-06 | Discharge: 2019-12-06 | Disposition: A | Discharge status: Home / Self Care | Payer: Medicare PPO | Attending: Emergency Medicine | Admitting: Emergency Medicine

## 2019-12-06 ENCOUNTER — Emergency Department (HOSPITAL_COMMUNITY): Payer: Medicare PPO

## 2019-12-06 ENCOUNTER — Encounter (HOSPITAL_COMMUNITY): Payer: Self-pay | Admitting: Emergency Medicine

## 2019-12-06 DIAGNOSIS — Z79899 Other long term (current) drug therapy: Secondary | ICD-10-CM | POA: Insufficient documentation

## 2019-12-06 DIAGNOSIS — Y9301 Activity, walking, marching and hiking: Secondary | ICD-10-CM | POA: Diagnosis not present

## 2019-12-06 DIAGNOSIS — R52 Pain, unspecified: Secondary | ICD-10-CM | POA: Diagnosis not present

## 2019-12-06 DIAGNOSIS — Z7901 Long term (current) use of anticoagulants: Secondary | ICD-10-CM | POA: Insufficient documentation

## 2019-12-06 DIAGNOSIS — Y92018 Other place in single-family (private) house as the place of occurrence of the external cause: Secondary | ICD-10-CM | POA: Insufficient documentation

## 2019-12-06 DIAGNOSIS — W010XXA Fall on same level from slipping, tripping and stumbling without subsequent striking against object, initial encounter: Secondary | ICD-10-CM | POA: Insufficient documentation

## 2019-12-06 DIAGNOSIS — Z7984 Long term (current) use of oral hypoglycemic drugs: Secondary | ICD-10-CM | POA: Insufficient documentation

## 2019-12-06 DIAGNOSIS — I10 Essential (primary) hypertension: Secondary | ICD-10-CM | POA: Diagnosis not present

## 2019-12-06 DIAGNOSIS — S8011XA Contusion of right lower leg, initial encounter: Secondary | ICD-10-CM | POA: Diagnosis not present

## 2019-12-06 DIAGNOSIS — S8991XA Unspecified injury of right lower leg, initial encounter: Secondary | ICD-10-CM | POA: Diagnosis present

## 2019-12-06 DIAGNOSIS — S40811A Abrasion of right upper arm, initial encounter: Secondary | ICD-10-CM | POA: Insufficient documentation

## 2019-12-06 DIAGNOSIS — W19XXXA Unspecified fall, initial encounter: Secondary | ICD-10-CM

## 2019-12-06 DIAGNOSIS — Y998 Other external cause status: Secondary | ICD-10-CM | POA: Diagnosis not present

## 2019-12-06 DIAGNOSIS — I110 Hypertensive heart disease with heart failure: Secondary | ICD-10-CM | POA: Insufficient documentation

## 2019-12-06 DIAGNOSIS — S50812A Abrasion of left forearm, initial encounter: Secondary | ICD-10-CM | POA: Diagnosis not present

## 2019-12-06 DIAGNOSIS — I509 Heart failure, unspecified: Secondary | ICD-10-CM | POA: Diagnosis not present

## 2019-12-06 DIAGNOSIS — S50811A Abrasion of right forearm, initial encounter: Secondary | ICD-10-CM | POA: Diagnosis not present

## 2019-12-06 DIAGNOSIS — I959 Hypotension, unspecified: Secondary | ICD-10-CM | POA: Diagnosis not present

## 2019-12-06 DIAGNOSIS — R5381 Other malaise: Secondary | ICD-10-CM | POA: Diagnosis not present

## 2019-12-06 DIAGNOSIS — S40812A Abrasion of left upper arm, initial encounter: Secondary | ICD-10-CM | POA: Insufficient documentation

## 2019-12-06 DIAGNOSIS — M2550 Pain in unspecified joint: Secondary | ICD-10-CM | POA: Diagnosis not present

## 2019-12-06 DIAGNOSIS — E119 Type 2 diabetes mellitus without complications: Secondary | ICD-10-CM | POA: Diagnosis not present

## 2019-12-06 DIAGNOSIS — S0990XA Unspecified injury of head, initial encounter: Secondary | ICD-10-CM | POA: Diagnosis not present

## 2019-12-06 DIAGNOSIS — Z7401 Bed confinement status: Secondary | ICD-10-CM | POA: Diagnosis not present

## 2019-12-06 HISTORY — DX: Heart failure, unspecified: I50.9

## 2019-12-06 HISTORY — DX: Essential (primary) hypertension: I10

## 2019-12-06 HISTORY — DX: Type 2 diabetes mellitus without complications: E11.9

## 2019-12-06 MED ORDER — OXYCODONE-ACETAMINOPHEN 5-325 MG PO TABS
1.0000 | ORAL_TABLET | Freq: Once | ORAL | Status: AC
  Administered 2019-12-06: 09:00:00 1 via ORAL
  Filled 2019-12-06: qty 1

## 2019-12-06 MED ORDER — OXYCODONE-ACETAMINOPHEN 5-325 MG PO TABS
1.0000 | ORAL_TABLET | Freq: Once | ORAL | Status: DC
  Filled 2019-12-06: qty 1

## 2019-12-06 NOTE — ED Notes (Signed)
Pt states that her daughter is not able to come to ED to pick her up, would like to go by ambulance. [facility] called at 1050.

## 2019-12-06 NOTE — Discharge Instructions (Addendum)
Your xrays did not show broken bones or signs of brain bleed.  I felt you were stable for discharge.   Please be extra careful walking the next few days until your wounds heal.  The hematoma can take 2-4 weeks to resolve.  You can continue your normal pain medications at home.

## 2019-12-06 NOTE — ED Notes (Signed)
Patient verbalizes understanding of discharge instructions. Opportunity for questioning and answers were provided. Armband removed by staff, pt discharged from ED to home with [facility]. Skin tear to R arm redressed per EDP.

## 2019-12-06 NOTE — ED Provider Notes (Signed)
[facility] EMERGENCY DEPARTMENT Provider Note   CSN: [id number] Arrival date & time: 12/06/19  7412     History CC:  Fall on A/c  Kristine Oliver is a 86 y.o. female history congestive heart failure, hypertension, A. fib on Eliquis, presented to emergency department with mechanical fall at home.  Patient reports that she lost her footing at 6 AM today and fell forward, landing on her right leg as well as both of her forearms.  She not strike her head on the ground.  There is no loss of consciousness.  She was able to ambulate afterwards.  She reports pain in her right shin as well as her right forearm.  The wounds were lightly dressed by EMS.  No headache or neck pain.  Chronic lower back pain, but no changes, no radiculopathy this morning.   HPI     Past Medical History:  Diagnosis Date  . CHF (congestive heart failure) ([place])   . Diabetes mellitus without complication ([place])   . Hypertension     There are no problems to display for this patient.   History reviewed. No pertinent surgical history.   OB History   No obstetric history on file.     History reviewed. No pertinent family history.  Social History   Tobacco Use  . Smoking status: Never Smoker  Substance Use Topics  . Alcohol use: Yes    Alcohol/week: 1.0 standard drinks    Types: 1 Glasses of wine per week  . Drug use: Not on file    Home Medications Prior to Admission medications   Medication Sig Start Date End Date Taking? Authorizing Provider  amLODipine (NORVASC) 2.5 MG tablet Take 2.5 mg by mouth daily. 10/04/19  Yes [provider]  clopidogrel (PLAVIX) 75 MG tablet Take 1 tablet by mouth every evening. 09/10/19  Yes [provider]  Cyanocobalamin (VITAMIN B 12) 500 MCG TABS Take 1 tablet by mouth daily.   Yes [provider]  cycloSPORINE (RESTASIS) 0.05 % ophthalmic emulsion Place 1 drop into both eyes 2 (two) times daily as needed (dry eyes).   Yes  [provider]  ELIQUIS 2.5 MG TABS tablet Take 2.5 mg by mouth 2 (two) times daily. 08/24/19  Yes [provider]  furosemide (LASIX) 20 MG tablet Take 1 tablet by mouth daily as needed for edema. 09/10/19  Yes [provider]  gabapentin (NEURONTIN) 300 MG capsule Take 300 mg by mouth 2 (two) times daily. 10/04/19  Yes [provider]  isosorbide mononitrate (IMDUR) 30 MG 24 hr tablet Take 30 mg by mouth daily. 10/04/19  Yes [provider]  levothyroxine (SYNTHROID) 75 MCG tablet Take 75 mcg by mouth daily. 11/29/19  Yes [provider]  metFORMIN (GLUCOPHAGE) 500 MG tablet Take 500 mg by mouth 2 (two) times daily. 10/20/19  Yes [provider]  nitroGLYCERIN (NITROSTAT) 0.4 MG SL tablet Place 1 tablet under the tongue as needed for chest pain. 11/02/19  Yes [provider]  Oxycodone HCl 10 MG TABS Take 10 mg by mouth 4 (four) times daily. 10/27/19  Yes [provider]  pantoprazole (PROTONIX) 40 MG tablet Take 40 mg by mouth daily. 10/31/19  Yes [provider]  ramipril (ALTACE) 5 MG capsule Take 5 mg by mouth daily. 10/29/19  Yes [provider]  rosuvastatin (CRESTOR) 20 MG tablet Take 20 mg by mouth at bedtime. 08/28/19  Yes [provider]  valACYclovir (VALTREX) 500 MG  tablet Take 500 mg by mouth 2 (two) times daily. 07/24/19  Yes [provider]    Allergies    Prednisone  Review of Systems   Review of Systems  Constitutional: Negative for chills and fever.  Eyes: Negative for pain and visual disturbance.  Respiratory: Negative for cough and shortness of breath.   Cardiovascular: Negative for chest pain and palpitations.  Gastrointestinal: Negative for abdominal pain and vomiting.  Musculoskeletal: Positive for arthralgias and myalgias. Negative for neck pain and neck stiffness.  Skin: Positive for wound. Negative for rash.  Neurological: Negative for syncope and headaches.    Psychiatric/Behavioral: Negative for agitation and confusion.  All other systems reviewed and are negative.   Physical Exam Updated Vital Signs BP (!) 118/54 (BP Location: Left Arm)   Pulse 82   Temp 98 F (36.7 C) (Oral)   Resp (!) 21   Ht 4\' 11"  (1.499 m)   Wt 63.5 kg   SpO2 100%   BMI 28.28 kg/m   Physical Exam Vitals and nursing note reviewed.  Constitutional:      General: She is not in acute distress.    Appearance: She is well-developed.  HENT:     Head: Normocephalic and atraumatic.  Eyes:     Conjunctiva/sclera: Conjunctivae normal.  Cardiovascular:     Rate and Rhythm: Normal rate and regular rhythm.     Pulses: Normal pulses.  Pulmonary:     Effort: Pulmonary effort is normal. No respiratory distress.  Abdominal:     Palpations: Abdomen is soft.     Tenderness: There is no abdominal tenderness.  Musculoskeletal:     Cervical back: Neck supple.     Comments: No significant bony tenderness of the forearms (radius or ulna), elbows, wrist joint, or lower extremities  Skin:    General: Skin is warm and dry.     Comments: Superficial abrasion and small hematoma overlying right tibia Abrasion of the right and left forearms  Neurological:     Mental Status: She is alert.  Psychiatric:        Mood and Affect: Mood normal.        Behavior: Behavior normal.     ED Results / Procedures / Treatments   Labs (all labs ordered are listed, but only abnormal results are displayed) Labs Reviewed - No data to display  EKG None  Radiology DG Forearm Left  Result Date: 12/06/2019 CLINICAL DATA:  Fall. Abrasions on the forearms. EXAM: LEFT FOREARM - 2 VIEW COMPARISON:  None. FINDINGS: There is mild cortical irregularity involving of the styloid process radius with assessment limited by obliquity on the lateral radiograph as well as by prominent chondrocalcinosis at the wrist. No acute fracture is identified elsewhere. The elbow is located. There is mild spurring along  the posterior olecranon. IMPRESSION: Irregularity of the radial styloid. Correlate for point tenderness and consider further evaluation with dedicated wrist radiographs to assess for a fracture. Electronically Signed   By: Logan Bores M.D.   On: 12/06/2019 08:57   DG Forearm Right  Result Date: 12/06/2019 CLINICAL DATA:  Fall. Abrasions on the forearms. EXAM: RIGHT FOREARM - 2 VIEW COMPARISON:  None. FINDINGS: No definite acute fracture is identified. Slight irregularity of the coronoid process of the ulna may be degenerative. The wrist and elbow are located. Mild chondrocalcinosis is noted at the wrist. No radiopaque foreign body is identified. IMPRESSION: No acute osseous abnormality identified. Electronically Signed   By: Seymour Bars.D.  On: 12/06/2019 08:52   DG Tibia/Fibula Right  Result Date: 12/06/2019 CLINICAL DATA:  Fall. On anticoagulation. Right leg pain and hematoma. Initial encounter. EXAM: RIGHT TIBIA AND FIBULA - 2 VIEW COMPARISON:  None. FINDINGS: There is no evidence of fracture or other focal bone lesions. Generalized osteopenia is noted. Peripheral vascular calcification is also seen. IMPRESSION: No acute findings. Electronically Signed   By: Marlaine Hind M.D.   On: 12/06/2019 08:49   CT Head Wo Contrast  Result Date: 12/06/2019 CLINICAL DATA:  Fall on blood thinners. EXAM: CT HEAD WITHOUT CONTRAST TECHNIQUE: Contiguous axial images were obtained from the base of the skull through the vertex without intravenous contrast. COMPARISON:  11/05/2019 FINDINGS: Brain: There is no evidence of acute infarct, intracranial hemorrhage, mass, midline shift, or extra-axial fluid collection. Chronic left larger than right cerebellar infarcts are again noted. Hypodensities in the cerebral white matter are unchanged and nonspecific but compatible with mild chronic small vessel ischemic disease. Mild cerebral atrophy is not greater than expected for age. Vascular: Calcified atherosclerosis at the  skull base. No hyperdense vessel. Skull: No acute fracture or suspicious osseous lesion. Sinuses/Orbits: Remote right orbital floor fracture. Left cataract extraction. The visualized paranasal sinuses and mastoid air cells are clear. Other: None. IMPRESSION: 1. No evidence of acute intracranial abnormality. 2. Chronic cerebellar infarcts. 3. Mild chronic small vessel ischemic disease. Electronically Signed   By: Logan Bores M.D.   On: 12/06/2019 09:01    Procedures Procedures (including critical care time)  Medications Ordered in ED Medications  oxyCODONE-acetaminophen (PERCOCET/ROXICET) 5-325 MG per tablet 1 tablet (1 tablet Oral Given 12/06/19 0846)    ED Course  I have reviewed the triage vital signs and the nursing notes.  Pertinent labs & imaging results that were available during my care of the patient were reviewed by me and considered in my medical decision making (see chart for details).  86 yo female here with mechanical fall at home, on eliquis for A Fib Small abrasion to right lower leg and bilateral forearms Small hematoma to right leg She can ambulate, no significant bony tenderness No evidence of hip fx, spinal fx, or head trauma  However given her age and the extent of her impact and her frailty, I think a CTH would be reasonable to evaluate for ICH.  She is agreeable with this.  Xrays ordered over injury sites Tdap already up to date  Per my exam, I have a low clinical suspicion for chest, intraabdominal or hip fracture or injury  Clinical Course as of Dec 06 1814  Wed Dec 06, 2019  9518 Per pdmp patient prescribed chronic oxycodone.  She reports she takes 5 mg per day up to 45 mg daily.  Asking for 5 mg here, we can give her a dose given her likely tolerance   [MT]  1016 No bony tenderness of left radial styloid suggestive of fx.  Patient reports she "messed up my wrist there years ago."  Suspect this is chronic   [MT]  1016 Stable for discharge, she'll call her  daughter   [MT]    Clinical Course User Index [MT] Gentle Hoge, Carola Rhine, MD    Final Clinical Impression(s) / ED Diagnoses Final diagnoses:  Fall, initial encounter  Hematoma of right lower leg  Abrasion of left upper extremity, initial encounter  Abrasion of right upper arm, initial encounter    Rx / DC Orders ED Discharge Orders    None       Thorvald Orsino,  Carola Rhine, MD 12/06/19 1816

## 2019-12-06 NOTE — Consult Note (Signed)
Responded to page, pt unavailable, no family present, nurse said that pt has disabled son at home, staff will page again if further need of chaplain services.   Rev. Eloise Levels Chaplain

## 2019-12-06 NOTE — ED Triage Notes (Signed)
Pt here from home as a level 2 trauma after a fall on blood thinners , hematoma to right leg did not hit head no loc

## 2019-12-07 ENCOUNTER — Encounter (HOSPITAL_COMMUNITY): Payer: Self-pay

## 2019-12-22 DIAGNOSIS — R1310 Dysphagia, unspecified: Secondary | ICD-10-CM | POA: Diagnosis not present

## 2019-12-22 DIAGNOSIS — I5030 Unspecified diastolic (congestive) heart failure: Secondary | ICD-10-CM | POA: Diagnosis not present

## 2019-12-24 DIAGNOSIS — I509 Heart failure, unspecified: Secondary | ICD-10-CM | POA: Diagnosis not present

## 2019-12-24 DIAGNOSIS — J9600 Acute respiratory failure, unspecified whether with hypoxia or hypercapnia: Secondary | ICD-10-CM | POA: Diagnosis not present

## 2019-12-24 DIAGNOSIS — R2681 Unsteadiness on feet: Secondary | ICD-10-CM | POA: Diagnosis not present

## 2019-12-24 DIAGNOSIS — M6281 Muscle weakness (generalized): Secondary | ICD-10-CM | POA: Diagnosis not present

## 2020-01-04 DIAGNOSIS — I5030 Unspecified diastolic (congestive) heart failure: Secondary | ICD-10-CM | POA: Diagnosis not present

## 2020-01-04 DIAGNOSIS — Z0000 Encounter for general adult medical examination without abnormal findings: Secondary | ICD-10-CM | POA: Diagnosis not present

## 2020-01-04 DIAGNOSIS — E113299 Type 2 diabetes mellitus with mild nonproliferative diabetic retinopathy without macular edema, unspecified eye: Secondary | ICD-10-CM | POA: Diagnosis not present

## 2020-01-04 DIAGNOSIS — R54 Age-related physical debility: Secondary | ICD-10-CM | POA: Diagnosis not present

## 2020-01-04 DIAGNOSIS — Z1389 Encounter for screening for other disorder: Secondary | ICD-10-CM | POA: Diagnosis not present

## 2020-01-09 ENCOUNTER — Telehealth: Payer: Self-pay | Admitting: Internal Medicine

## 2020-01-09 NOTE — Telephone Encounter (Signed)
Called patient's home & cell number to schedule the Palliative Consult, no answer - left message with reason for call along with my name and contact number.

## 2020-01-10 ENCOUNTER — Other Ambulatory Visit: Payer: Self-pay | Admitting: Cardiology

## 2020-01-18 ENCOUNTER — Telehealth: Payer: Self-pay | Admitting: Cardiology

## 2020-01-18 NOTE — Telephone Encounter (Signed)
Called to speak with patient regarding her home 75.  She said she didn't know much about it but that I could "speak with this person who knows."  She handed the phone to someone who called himself "Jonni Sanger, her caretaker" who proceeded to tell me the pt has home 02 that is a hazard and danger to her health.   He explained she keeps getting fluid in her lines (that are 50 ft long) and is then breathing the liquid in through the tubing.  He is highly concerned she will aspirate the liquid.  Her 02 must be hydrated d/t nosebleeds.  He reports they have called the company multiple times and this is the 4 th machine that has been in the home.  He is very frustrated AEB his tone of voice and increasing loudness telling me I need to do something about it because she is in danger.  Advised while I understand his concerns, Dr Marlou Porch does not order home 02 and they will need to contact the MD who did.  Jonni Sanger reports he does not know who ordered it or when it was ordered, either while she was in the hospital or when she went to [facility].  Advised I will look through her records to try to determine who ordered the 02 and if found they could call that office in attempt to change home 02 providers.  I have reviewed the chart in detail looking through multiple areas however have not been able to find the ordering physician.  Pt/Andy will need to contact pt's PCP Dr Laurann Montana to see if he will order or if he will refer her to pulmonary for further eval and treatment.

## 2020-01-18 NOTE — Telephone Encounter (Signed)
Pt called to speak with Dr. Marlou Porch about the oxygen machine she has. Says that she wants to switch from [place] to [place] due to the fact of her health being in danger. Needs Dr. Marlou Porch to send in a prescription to [place] so that they can give her the proper equipment she needs. Wants nurse to give her a call.

## 2020-01-19 NOTE — Telephone Encounter (Signed)
Pt and pt's caretaker aware to notify PCP or Pulmonary to get order for new O2 therapy from another Co[facility]

## 2020-01-22 ENCOUNTER — Telehealth: Payer: Self-pay | Admitting: Internal Medicine

## 2020-01-22 NOTE — Telephone Encounter (Signed)
[facility] Palliative visit 01-26-20 at 2:00.

## 2020-01-24 DIAGNOSIS — J9600 Acute respiratory failure, unspecified whether with hypoxia or hypercapnia: Secondary | ICD-10-CM | POA: Diagnosis not present

## 2020-01-24 DIAGNOSIS — I509 Heart failure, unspecified: Secondary | ICD-10-CM | POA: Diagnosis not present

## 2020-01-24 DIAGNOSIS — R2681 Unsteadiness on feet: Secondary | ICD-10-CM | POA: Diagnosis not present

## 2020-01-24 DIAGNOSIS — M6281 Muscle weakness (generalized): Secondary | ICD-10-CM | POA: Diagnosis not present

## 2020-01-26 ENCOUNTER — Other Ambulatory Visit: Payer: Self-pay

## 2020-01-26 ENCOUNTER — Other Ambulatory Visit: Payer: Self-pay | Admitting: Internal Medicine

## 2020-01-26 DIAGNOSIS — Z515 Encounter for palliative care: Secondary | ICD-10-CM

## 2020-01-26 NOTE — Progress Notes (Signed)
01/26/2020:  Patient not seen d/t provider scheduling conflict.  Our scheduler called and spoke to the son this morning per my request and son advised he would f/u with patient and call us back to reschedule.  Violeta Gelinas NP-C [phone]

## 2020-02-01 ENCOUNTER — Telehealth: Payer: Self-pay | Admitting: Internal Medicine

## 2020-02-01 NOTE — Telephone Encounter (Signed)
Spoke with son Herbie [place], to see if he spoke with patient about rescheduling the Initial Palliative Consult and he said yes and the she requested something after 11 AM.  He requested that I call Harmon Pier (patient's nephew) to reschedule.  Called [place] with no answer - left message with my name and contact number requesting a call back to reschedule the visit.

## 2020-02-07 ENCOUNTER — Emergency Department (HOSPITAL_COMMUNITY)
Admission: EM | Admit: 2020-02-07 | Discharge: 2020-02-08 | Disposition: A | Discharge status: Home / Self Care | Payer: Medicare PPO | Attending: Emergency Medicine | Admitting: Emergency Medicine

## 2020-02-07 ENCOUNTER — Encounter (HOSPITAL_COMMUNITY): Payer: Self-pay

## 2020-02-07 ENCOUNTER — Emergency Department (HOSPITAL_COMMUNITY): Payer: Medicare PPO

## 2020-02-07 ENCOUNTER — Other Ambulatory Visit: Payer: Self-pay

## 2020-02-07 DIAGNOSIS — I5032 Chronic diastolic (congestive) heart failure: Secondary | ICD-10-CM | POA: Diagnosis not present

## 2020-02-07 DIAGNOSIS — I130 Hypertensive heart and chronic kidney disease with heart failure and stage 1 through stage 4 chronic kidney disease, or unspecified chronic kidney disease: Secondary | ICD-10-CM | POA: Diagnosis not present

## 2020-02-07 DIAGNOSIS — R0602 Shortness of breath: Secondary | ICD-10-CM

## 2020-02-07 DIAGNOSIS — Z7901 Long term (current) use of anticoagulants: Secondary | ICD-10-CM | POA: Diagnosis not present

## 2020-02-07 DIAGNOSIS — Z79899 Other long term (current) drug therapy: Secondary | ICD-10-CM | POA: Diagnosis not present

## 2020-02-07 DIAGNOSIS — N183 Chronic kidney disease, stage 3 unspecified: Secondary | ICD-10-CM | POA: Diagnosis not present

## 2020-02-07 DIAGNOSIS — J90 Pleural effusion, not elsewhere classified: Secondary | ICD-10-CM | POA: Diagnosis not present

## 2020-02-07 DIAGNOSIS — Z7984 Long term (current) use of oral hypoglycemic drugs: Secondary | ICD-10-CM | POA: Insufficient documentation

## 2020-02-07 DIAGNOSIS — I2510 Atherosclerotic heart disease of native coronary artery without angina pectoris: Secondary | ICD-10-CM | POA: Insufficient documentation

## 2020-02-07 DIAGNOSIS — Z20822 Contact with and (suspected) exposure to covid-19: Secondary | ICD-10-CM | POA: Insufficient documentation

## 2020-02-07 DIAGNOSIS — J189 Pneumonia, unspecified organism: Secondary | ICD-10-CM | POA: Diagnosis not present

## 2020-02-07 DIAGNOSIS — J9611 Chronic respiratory failure with hypoxia: Secondary | ICD-10-CM | POA: Insufficient documentation

## 2020-02-07 DIAGNOSIS — F29 Unspecified psychosis not due to a substance or known physiological condition: Secondary | ICD-10-CM | POA: Diagnosis not present

## 2020-02-07 DIAGNOSIS — R52 Pain, unspecified: Secondary | ICD-10-CM | POA: Diagnosis not present

## 2020-02-07 DIAGNOSIS — E1122 Type 2 diabetes mellitus with diabetic chronic kidney disease: Secondary | ICD-10-CM | POA: Insufficient documentation

## 2020-02-07 DIAGNOSIS — R609 Edema, unspecified: Secondary | ICD-10-CM | POA: Diagnosis not present

## 2020-02-07 DIAGNOSIS — J849 Interstitial pulmonary disease, unspecified: Secondary | ICD-10-CM | POA: Insufficient documentation

## 2020-02-07 DIAGNOSIS — R0600 Dyspnea, unspecified: Secondary | ICD-10-CM | POA: Diagnosis not present

## 2020-02-07 DIAGNOSIS — R0902 Hypoxemia: Secondary | ICD-10-CM | POA: Diagnosis not present

## 2020-02-07 HISTORY — DX: Unspecified atrial fibrillation: I48.91

## 2020-02-07 LAB — CBC WITH DIFFERENTIAL/PLATELET
Abs Immature Granulocytes: 0.02 K/uL (ref 0.00–0.07)
Basophils Absolute: 0.1 K/uL (ref 0.0–0.1)
Basophils Relative: 1 %
Eosinophils Absolute: 0.3 K/uL (ref 0.0–0.5)
Eosinophils Relative: 5 %
HCT: 32.3 % — ABNORMAL LOW (ref 36.0–46.0)
Hemoglobin: 10 g/dL — ABNORMAL LOW (ref 12.0–15.0)
Immature Granulocytes: 0 %
Lymphocytes Relative: 14 %
Lymphs Abs: 1 K/uL (ref 0.7–4.0)
MCH: 29.3 pg (ref 26.0–34.0)
MCHC: 31 g/dL (ref 30.0–36.0)
MCV: 94.7 fL (ref 80.0–100.0)
Monocytes Absolute: 0.5 K/uL (ref 0.1–1.0)
Monocytes Relative: 7 %
Neutro Abs: 5.4 K/uL (ref 1.7–7.7)
Neutrophils Relative %: 73 %
Platelets: 298 K/uL (ref 150–400)
RBC: 3.41 MIL/uL — ABNORMAL LOW (ref 3.87–5.11)
RDW: 17.3 % — ABNORMAL HIGH (ref 11.5–15.5)
WBC: 7.4 K/uL (ref 4.0–10.5)
nRBC: 0 % (ref 0.0–0.2)

## 2020-02-07 NOTE — ED Triage Notes (Signed)
Pt BIB [facility] for eval of SOB all day. Pt had fall 2 weeks ago, causing leg pain to the point where she now has to use a wheelchair to get around. Pt noted progressive weakness since that time, blt LE pitting edema. Pt reports compliance w/ meds including lasix. Chronic afib, rate controlled. Pt arrives extremely anxious, but redirectable.

## 2020-02-08 ENCOUNTER — Encounter (HOSPITAL_COMMUNITY): Payer: Self-pay

## 2020-02-08 ENCOUNTER — Other Ambulatory Visit: Payer: Self-pay

## 2020-02-08 ENCOUNTER — Emergency Department (HOSPITAL_COMMUNITY): Payer: Medicare PPO

## 2020-02-08 DIAGNOSIS — R0600 Dyspnea, unspecified: Secondary | ICD-10-CM

## 2020-02-08 DIAGNOSIS — I5032 Chronic diastolic (congestive) heart failure: Secondary | ICD-10-CM

## 2020-02-08 DIAGNOSIS — Z66 Do not resuscitate: Secondary | ICD-10-CM

## 2020-02-08 DIAGNOSIS — Z515 Encounter for palliative care: Secondary | ICD-10-CM

## 2020-02-08 DIAGNOSIS — R5381 Other malaise: Secondary | ICD-10-CM | POA: Diagnosis not present

## 2020-02-08 DIAGNOSIS — R0602 Shortness of breath: Secondary | ICD-10-CM | POA: Diagnosis not present

## 2020-02-08 DIAGNOSIS — I10 Essential (primary) hypertension: Secondary | ICD-10-CM | POA: Diagnosis not present

## 2020-02-08 DIAGNOSIS — M2550 Pain in unspecified joint: Secondary | ICD-10-CM | POA: Diagnosis not present

## 2020-02-08 DIAGNOSIS — Z7189 Other specified counseling: Secondary | ICD-10-CM

## 2020-02-08 DIAGNOSIS — Z7401 Bed confinement status: Secondary | ICD-10-CM | POA: Diagnosis not present

## 2020-02-08 DIAGNOSIS — R0902 Hypoxemia: Secondary | ICD-10-CM | POA: Diagnosis not present

## 2020-02-08 DIAGNOSIS — J849 Interstitial pulmonary disease, unspecified: Secondary | ICD-10-CM

## 2020-02-08 DIAGNOSIS — J9611 Chronic respiratory failure with hypoxia: Secondary | ICD-10-CM

## 2020-02-08 LAB — COMPREHENSIVE METABOLIC PANEL
ALT: 10 U/L (ref 0–44)
AST: 19 U/L (ref 15–41)
Albumin: 3.2 g/dL — ABNORMAL LOW (ref 3.5–5.0)
Alkaline Phosphatase: 112 U/L (ref 38–126)
Anion gap: 7 (ref 5–15)
BUN: 15 mg/dL (ref 8–23)
CO2: 23 mmol/L (ref 22–32)
Calcium: 11.2 mg/dL — ABNORMAL HIGH (ref 8.9–10.3)
Chloride: 107 mmol/L (ref 98–111)
Creatinine, Ser: 1.02 mg/dL — ABNORMAL HIGH (ref 0.44–1.00)
GFR calc Af Amer: 57 mL/min — ABNORMAL LOW (ref >60)
GFR calc non Af Amer: 49 mL/min — ABNORMAL LOW (ref >60)
Glucose, Bld: 109 mg/dL — ABNORMAL HIGH (ref 70–99)
Potassium: 4.9 mmol/L (ref 3.5–5.1)
Sodium: 137 mmol/L (ref 135–145)
Total Bilirubin: 0.7 mg/dL (ref 0.3–1.2)
Total Protein: 7 g/dL (ref 6.5–8.1)

## 2020-02-08 LAB — BRAIN NATRIURETIC PEPTIDE: B Natriuretic Peptide: 433.3 pg/mL — ABNORMAL HIGH (ref 0.0–100.0)

## 2020-02-08 LAB — SARS CORONAVIRUS 2 BY RT PCR (HOSPITAL ORDER, PERFORMED IN ~~LOC~~ HOSPITAL LAB): SARS Coronavirus 2: NEGATIVE

## 2020-02-08 MED ORDER — SODIUM CHLORIDE 0.9 % IV SOLN
1.0000 g | Freq: Once | INTRAVENOUS | Status: AC
  Administered 2020-02-08: 1 g via INTRAVENOUS
  Filled 2020-02-08: qty 10

## 2020-02-08 MED ORDER — SODIUM CHLORIDE 0.9 % IV SOLN
500.0000 mg | Freq: Once | INTRAVENOUS | Status: AC
  Administered 2020-02-08: 500 mg via INTRAVENOUS
  Filled 2020-02-08: qty 500

## 2020-02-08 MED ORDER — IOHEXOL 350 MG/ML SOLN
100.0000 mL | Freq: Once | INTRAVENOUS | Status: AC | PRN
  Administered 2020-02-08: 100 mL via INTRAVENOUS

## 2020-02-08 MED ORDER — OXYCODONE-ACETAMINOPHEN 5-325 MG PO TABS
2.0000 | ORAL_TABLET | Freq: Once | ORAL | Status: AC
  Administered 2020-02-08: 2 via ORAL
  Filled 2020-02-08: qty 2

## 2020-02-08 MED ORDER — LORAZEPAM 2 MG/ML IJ SOLN
0.5000 mg | Freq: Once | INTRAMUSCULAR | Status: AC
  Administered 2020-02-08: 0.5 mg via INTRAVENOUS
  Filled 2020-02-08: qty 1

## 2020-02-08 MED ORDER — OXYCODONE-ACETAMINOPHEN 5-325 MG PO TABS
1.0000 | ORAL_TABLET | Freq: Once | ORAL | Status: AC
  Administered 2020-02-08: 1 via ORAL
  Filled 2020-02-08: qty 1

## 2020-02-08 NOTE — Discharge Instructions (Addendum)
Continue your current meds   See your doctor. Home health will contact you   Return to ER if you have worse shortness of breath, fever

## 2020-02-08 NOTE — ED Provider Notes (Signed)
Care of the patient assumed at the change of shift. She has a history of chronic lung disease, admitted for same in Apri 2021 and then went to SNF on Oxygen which was continued after she went home a few weeks later.  Patient here in the ED for SOB, noted to be hypoxic on RA with exertion but improved with [place] O2. She has unremarkable labs, although CXR was concerning for infiltrates. CTA was ordered and was neg for PE. She has fibrous and bronchiectatic changes improved from last CT.   I spoke with the patient's nephew/Caregiver, Jonni Sanger, who reports some frustration with her current Home Oxygen supplier with multiple defective machines and that last night the machine seemed to stop working altogether. He has looked into a different company but is unsure who had originally prescribed her current Oxygen and so was at a loss as to how to switch providers. She has also been attempting to get a home Palliative care consult but that has not been completed yet.   Patient is currently resting comfortably with out distress. Will discuss with Palliative Care and Case Management regarding home oxygen needs.   2:19 PM Spoke with Palliative care coordinator, she will arrange for an ED consult today.  Spoke with Rosendo Gros, Case Management, she has contact a new Oxygen supplier and will arrange for a switch out of equipment at home.  Otherwise the patient's family is comfortably with her returning home.    Truddie Hidden, MD 02/08/20 1420

## 2020-02-08 NOTE — ED Notes (Signed)
Pt screaming while in room requesting someone remain at bedside, this RN redirected pt and reassured, curtain remains open for pt comfort. Pt settled at this time, continuous cardiac monitoring in place, call bell in reach.

## 2020-02-08 NOTE — Care Management (Addendum)
ED CM spoke with Kristine Oliver Palliative NP concerning patient goals of care for home hospice services.  According to [place] NP goals of care was discussed with patient and son Kristine Oliver [phone] and Kristine Oliver [phone] (daughter-in-law) and patient and family verbalized understanding.  Patient was offered choice of Hospice agency and [facility] was selected, and Liaison contacted. Awaiting Kristine Oliver consultation.

## 2020-02-08 NOTE — ED Notes (Signed)
Pt using profanity and yelling with nurses requesting to use bedside commode, pt gait unsteady and steady with standing. Pt removed nasal cannula from nose, pt refusing to wear nasal cannula, SPO2 92 on room air. Pt has visible edema noted to left lower extremity. Pt SPO2 decreases with ambulation and transferring.

## 2020-02-08 NOTE — ED Notes (Signed)
SATURATION QUALIFICATIONS: (This note is used to comply with regulatory documentation for home oxygen)  Patient Saturations on Room Air at Rest = 87%  Patient Saturations on Room Air while Ambulating =   Patient Saturations on 2 Liters of oxygen while Ambulating = 90%  Please briefly explain why patient needs home oxygen: spo2 <88% while resting on room air with increased dyspnea.

## 2020-02-08 NOTE — ED Provider Notes (Signed)
  Physical Exam  BP (!) 145/63   Pulse 79   Temp 98.1 F (36.7 C) (Oral)   Resp 17   Ht 4\' 11"  (1.499 m)   Wt 64 kg   SpO2 96%   BMI 28.50 kg/m   Physical Exam  ED Course/Procedures   Clinical Course as of Feb 07 1757  Thu Feb 08, 2020  0620 Patient noted to be very agitated.  She is requiring assist to the bedside commode and noted to desat with minimal exertion.  She is refusing Covid swabbing.  Has yet to get her CT scan due to agitation.  Will give 0.5 mg of Ativan.   [CH]    Clinical Course User Index [CH] Horton, Barbette Hair, MD    Procedures  MDM  Care assumed at 3 PM.  Patient is on hospice and requesting another oxygen tank.  Palliative care also needs to see patient.  5:59 PM Palliative care saw patient and case management also has been assisting with dispo planning.  They were able to get another agency to deliver oxygen.  Patient already has home health and will be switching to a different company.    Drenda Freeze, MD 02/08/20 [phone]

## 2020-02-08 NOTE — ED Notes (Signed)
Will RT contacted regarding pt request for humidified oxygen. Pt currently 90% on room air.

## 2020-02-08 NOTE — ED Provider Notes (Signed)
[facility] EMERGENCY DEPARTMENT Provider Note   CSN: [id number] Arrival date & time: 02/07/20  2236     History Chief Complaint  Patient presents with  . Shortness of Breath    Kristine Oliver is a 86 y.o. female.  HPI    This is an 86 year old female with a history of anxiety, CAD, CHF, diabetes, hypertension, hyperlipidemia who presents with shortness of breath.  When asked how long she has been short of breath she states "I do not know a long time."  She states months of shortness of breath with acute worsening over the last several days.  She uses oxygen as needed at home "only when I am doing things."  She reports a productive cough.  No fevers.  No known sick contacts.  She has received 2 Covid vaccinations.  She denies any orthopnea.  She reports that she fell several weeks ago and since that time has noted left greater than right lower extremity swelling and pain.  She denies any chest pain.    Past Medical History:  Diagnosis Date  . Anxiety   . Atrial fibrillation ([place])   . Bradycardia   . CAD (coronary artery disease)    a. 08/2004 NSTEMI/PCI: LAD 24m/d (2.75 x 28 mm Taxus 2 DES);  b. 10/2010 Cath: LM nl, LAD patent stents w/ jailed septal, LCX 30, RCA 30; c. 06/2013 MV: No ischemia. Small fixed anteroseptal defect, ? scar vs atten. EF 62%; d. 03/2016 MV: EF 57%, Low risk.  . CHF (congestive heart failure) ([place])   . Chronic diastolic heart failure ([place])    a. Echo 7/13:  mod LVH, EF 65-70%, vigorous LV, Gr 1 diast dysfn, mild LAE; b. 05/2017 Echo: EF 60-65%, mod LVH, no rwma, Gr1 DD, mildly dil LA.  Marland Kitchen Chronic pain disorder   . CKD (chronic kidney disease), stage III   . DDD (degenerative disc disease), lumbosacral    , Spinal stenosis  . Diabetes mellitus ([place])    , Type II  . Diabetes mellitus without complication ([place])   . GERD (gastroesophageal reflux disease)   . HTN (hypertension)   . Hyperlipidemia   . Hypertension   . Hypothyroidism   .  IBS (irritable bowel syndrome)   . Lumbar disc disease   . Nonproliferative diabetic retinopathy associated with type 2 diabetes mellitus ([place])   . Obesity (BMI 30-39.9)   . Peripheral neuropathy    , Associated with DM  . Scoliosis     Patient Active Problem List   Diagnosis Date Noted  . Palliative care by specialist   . DNR (do not resuscitate)   . Acute respiratory failure ([place]) 11/05/2019  . Acute on chronic diastolic CHF (congestive heart failure) ([place]) 11/05/2019  . Syncope 11/05/2019  . Adjustment disorder with depressed mood   . Stable angina ([place]) 10/10/2019  . CKD (chronic kidney disease) stage 3, GFR 30-59 ml/min 10/09/2019  . AKI (acute kidney injury) ([place])   . Unstable angina ([place]) 10/29/2018  . Persistent atrial fibrillation ([place])   . Status post coronary artery stent placement   . NSTEMI (non-ST elevated myocardial infarction) ([place]) 03/18/2018  . Essential hypertension 05/19/2016  . Chest pain 05/18/2016  . Bradycardia 09/26/2014  . Sinus bradycardia 10/30/2013  . Weakness generalized 10/30/2013  . Chronic diastolic CHF (congestive heart failure) ([place]) 06/26/2013  . CAD (coronary artery disease) 02/12/2012  . Hypercalcemia 02/12/2012  . Chronic pain disorder   . Hypothyroidism   . Type 2  diabetes mellitus with vascular disease ([place])   . Hyperlipidemia associated with type 2 diabetes mellitus ([place])   . Lumbar disc disease   . Obesity (BMI 30-39.9)   . Hypertensive heart disease without CHF   . Bipolar affective disorder ([place])   . Scoliosis     Past Surgical History:  Procedure Laterality Date  . BACK SURGERY    . CARDIAC CATHETERIZATION    . CARDIOVERSION N/A 08/25/2018   Procedure: CARDIOVERSION;  Surgeon: Jerline Pain, MD;  [facility] ENDOSCOPY;  Service: Cardiovascular;  Laterality: N/A;  . CHOLECYSTECTOMY    . CORONARY STENT INTERVENTION N/A 03/21/2018   Procedure: CORONARY STENT INTERVENTION;  Surgeon: Jettie Booze, MD;  Location: [place] CV LAB;  Service: Cardiovascular;  Laterality: N/A;  . DILATION AND CURETTAGE OF UTERUS    . HEMILAMINOTOMY LUMBAR SPINE  2002   Rt L5, with decompression and foraminotomy  . INDUCED ABORTION     , x2  . LAMINOTOMY Right   . LEFT HEART CATH AND CORONARY ANGIOGRAPHY N/A 03/21/2018   Procedure: LEFT HEART CATH AND CORONARY ANGIOGRAPHY;  Surgeon: Jettie Booze, MD;  Location: [place] CV LAB;  Service: Cardiovascular;  Laterality: N/A;  . LUMBAR [place] SURGERY  07/1999   L5-S1  . PILONIDAL CYST EXCISION    . ROTATOR CUFF REPAIR    . TONSILLECTOMY    . TOTAL ABDOMINAL HYSTERECTOMY W/ BILATERAL SALPINGOOPHORECTOMY       OB History   No obstetric history on file.     Family History  Problem Relation Age of Onset  . Heart failure Father   . Diabetes Father   . Coronary artery disease Mother     Social History   Tobacco Use  . Smoking status: Never Smoker  . Smokeless tobacco: Never Used  Vaping Use  . Vaping Use: Never used  Substance Use Topics  . Alcohol use: Yes    Alcohol/week: 1.0 standard drink    Types: 1 Glasses of wine per week    Comment: social drinking, not often  . Drug use: No    Home Medications Prior to Admission medications   Medication Sig Start Date End Date Taking? Authorizing Provider  albuterol (VENTOLIN HFA) 108 (90 Base) MCG/ACT inhaler Inhale 1 puff into the lungs every 4 (four) hours as needed for wheezing or shortness of breath.  09/25/19   [provider]  amLODipine (NORVASC) 2.5 MG tablet TAKE 1 TABLET(2.5 MG) BY MOUTH DAILY 01/11/20   Jerline Pain, MD  apixaban (ELIQUIS) 2.5 MG TABS tablet Take 1 tablet (2.5 mg total) by mouth 2 (two) times daily. 10/31/18   Duke, Tami Lin, PA  ASPERCREME LIDOCAINE EX Apply 1 application topically 2 (two) times daily as needed (back/feet pain).    [provider]  clindamycin (CLEOCIN T) 1 % lotion Apply 1 application topically 2 (two) times daily. Facial wash 11/16/13    [provider]  clopidogrel (PLAVIX) 75 MG tablet Take 1 tablet (75 mg total) by mouth daily. 03/23/18   Kroeger, Lorelee Cover., PA-C  clopidogrel (PLAVIX) 75 MG tablet Take 1 tablet by mouth every evening. 09/10/19   [provider]  Cyanocobalamin (VITAMIN B 12) 500 MCG TABS Take 1 tablet by mouth daily.    [provider]  cycloSPORINE (RESTASIS) 0.05 % ophthalmic emulsion Place 1 drop into both eyes 2 (two) times daily as needed (dry eyes).     [provider]  cycloSPORINE (RESTASIS) 0.05 %  ophthalmic emulsion Place 1 drop into both eyes 2 (two) times daily as needed (dry eyes).    [provider]  docusate sodium (COLACE) 100 MG capsule Take 100 mg by mouth daily.    [provider]  ELIQUIS 2.5 MG TABS tablet Take 2.5 mg by mouth 2 (two) times daily. 08/24/19   [provider]  furosemide (LASIX) 20 MG tablet Take 1 tablet (20 mg total) by mouth daily. 11/11/19   Donne Hazel, MD  furosemide (LASIX) 20 MG tablet Take 1 tablet by mouth daily as needed for edema. 09/10/19   [provider]  gabapentin (NEURONTIN) 300 MG capsule Take 300 mg by mouth 2 (two) times daily.     [provider]  gabapentin (NEURONTIN) 300 MG capsule Take 300 mg by mouth 2 (two) times daily. 10/04/19   [provider]  hydrOXYzine (ATARAX/VISTARIL) 10 MG tablet Take 1 tablet (10 mg total) by mouth 3 (three) times daily as needed for anxiety. 11/10/19   Donne Hazel, MD  isosorbide mononitrate (IMDUR) 30 MG 24 hr tablet Take 30 mg by mouth daily. 10/04/19   [provider]  levothyroxine (SYNTHROID) 75 MCG tablet Take 75 mcg by mouth daily. 11/29/19   [provider]  levothyroxine (SYNTHROID, LEVOTHROID) 75 MCG tablet Take 75 mcg by mouth daily.    [provider]  Melatonin 3 MG TABS Take 3 mg by mouth at bedtime as needed (sleep).    [provider]  metFORMIN (GLUCOPHAGE) 500 MG tablet Take 500 mg by  mouth 2 (two) times daily with a meal.  03/17/16   [provider]  metFORMIN (GLUCOPHAGE) 500 MG tablet Take 500 mg by mouth 2 (two) times daily. 10/20/19   [provider]  nitroGLYCERIN (NITROSTAT) 0.4 MG SL tablet DISSOLVE 1 TABLET UNDER THE TONGUE EVERY 5 MINUTES FOR 3 DOSES AS NEEDED FOR CHEST PAIN Patient taking differently: Place 0.4 mg under the tongue every 5 (five) minutes x 3 doses as needed for chest pain.  01/23/19   Cheryln Manly, NP  nitroGLYCERIN (NITROSTAT) 0.4 MG SL tablet Place 1 tablet under the tongue as needed for chest pain. 11/02/19   [provider]  oxyCODONE 10 MG TABS Take 1 tablet (10 mg total) by mouth every 6 (six) hours as needed for moderate pain. Prescription needed for SNF placement 11/10/19   Donne Hazel, MD  Oxycodone HCl 10 MG TABS Take 10 mg by mouth 4 (four) times daily. 10/27/19   [provider]  pantoprazole (PROTONIX) 40 MG tablet Take 1 tablet (40 mg total) by mouth daily. 11/11/19   Donne Hazel, MD  pantoprazole (PROTONIX) 40 MG tablet Take 40 mg by mouth daily. 10/31/19   [provider]  ramipril (ALTACE) 5 MG capsule Take 5 mg by mouth daily. 10/29/19   [provider]  rosuvastatin (CRESTOR) 20 MG tablet TAKE 1 TABLET(20 MG) BY MOUTH DAILY AT 6 PM Patient taking differently: Take 20 mg by mouth every evening.  05/26/19   Kroeger, Lorelee Cover., PA-C  rosuvastatin (CRESTOR) 20 MG tablet Take 20 mg by mouth at bedtime. 08/28/19   [provider]  sodium chloride (OCEAN) 0.65 % SOLN nasal spray Place 1 spray into both nostrils as needed for congestion (dryness).    [provider]  valACYclovir (VALTREX) 500 MG tablet Take 500 mg by mouth 2 (two) times daily.     [provider]  valACYclovir (VALTREX) 500 MG  tablet Take 500 mg by mouth 2 (two) times daily. 07/24/19   [provider]  vitamin B-12 (CYANOCOBALAMIN) 500 MCG tablet Take 500 mcg by mouth daily.     [provider]    Allergies    Pravastatin, Prednisone, and Prednisone  Review of Systems   Review of Systems  Constitutional: Negative for fever.  Respiratory: Positive for cough and shortness of breath.   Cardiovascular: Positive for leg swelling. Negative for chest pain.  Gastrointestinal: Negative for abdominal pain, nausea and vomiting.  Neurological: Negative for light-headedness.  Psychiatric/Behavioral: The patient is nervous/anxious.   All other systems reviewed and are negative.   Physical Exam Updated Vital Signs BP (!) 151/82 (BP Location: Right Arm)   Pulse 79   Temp 98.1 F (36.7 C) (Oral)   Resp 20   Ht 1.499 m (4\' 11" )   Wt 64 kg   SpO2 96%   BMI 28.50 kg/m   Physical Exam Vitals and nursing note reviewed.  Constitutional:      Comments: Elderly, obese, chronically ill-appearing  HENT:     Head: Normocephalic and atraumatic.  Eyes:     Pupils: Pupils are equal, round, and reactive to light.  Cardiovascular:     Rate and Rhythm: Normal rate and regular rhythm.     Heart sounds: Normal heart sounds.  Pulmonary:     Effort: Pulmonary effort is normal. No tachypnea, accessory muscle usage or respiratory distress.     Breath sounds: Normal breath sounds. No wheezing.     Comments: Speaking in full sentences Chest:     Chest wall: No tenderness.  Abdominal:     General: Bowel sounds are normal.     Palpations: Abdomen is soft.  Musculoskeletal:     Cervical back: Neck supple.     Comments: Left greater than right lower extremity edema, there is a fine petechial rash over the left lower extremity, bilateral chronic venous stasis changes  Skin:    General: Skin is warm and dry.  Neurological:     Mental Status: She is alert and oriented to person, place, and time.  Psychiatric:        Mood and Affect: Mood is anxious.     ED Results / Procedures / Treatments   Labs (all labs ordered are listed, but only abnormal results are  displayed) Labs Reviewed  COMPREHENSIVE METABOLIC PANEL - Abnormal; Notable for the following components:      Result Value   Glucose, Bld 109 (*)    Creatinine, Ser 1.02 (*)    Calcium 11.2 (*)    Albumin 3.2 (*)    GFR calc non Af Amer 49 (*)    GFR calc Af Amer 57 (*)    All other components within normal limits  CBC WITH DIFFERENTIAL/PLATELET - Abnormal; Notable for the following components:   RBC 3.41 (*)    Hemoglobin 10.0 (*)    HCT 32.3 (*)    RDW 17.3 (*)    All other components within normal limits  BRAIN NATRIURETIC PEPTIDE - Abnormal; Notable for the following components:   B Natriuretic Peptide 433.3 (*)    All other components within normal limits  SARS CORONAVIRUS 2 BY RT PCR (HOSPITAL ORDER, [place] [facility])  CULTURE, BLOOD (ROUTINE X 2)  CULTURE, BLOOD (ROUTINE X 2)    EKG None  Radiology DG Chest 2 View  Result Date: 02/07/2020 CLINICAL DATA:  Shortness of breath EXAM: CHEST - 2 VIEW  COMPARISON:  11/26/2019, 11/04/2019, 10/09/2019 FINDINGS: No pleural effusion. Diffuse increased interstitial and ground-glass opacity with more focal airspace disease in the right peripheral upper lung. Stable cardiomediastinal silhouette. No pneumothorax. IMPRESSION: Diffuse interstitial and ground-glass opacity may reflect edema or diffuse pneumonia. Slightly more confluent airspace disease in the periphery of the right upper lung. Findings appear worse when compared to 11/26/2019. Electronically Signed   By: Donavan Foil M.D.   On: 02/07/2020 22:59    Procedures Procedures (including critical care time)  Medications Ordered in ED Medications  cefTRIAXone (ROCEPHIN) 1 g in sodium chloride 0.9 % 100 mL IVPB (0 g Intravenous Hold 02/08/20 0640)  azithromycin (ZITHROMAX) 500 mg in sodium chloride 0.9 % 250 mL IVPB (has no administration in time range)  oxyCODONE-acetaminophen (PERCOCET/ROXICET) 5-325 MG per tablet 1 tablet (1 tablet Oral Given 02/08/20 0509)   LORazepam (ATIVAN) injection 0.5 mg (0.5 mg Intravenous Given 02/08/20 0641)    ED Course  I have reviewed the triage vital signs and the nursing notes.  Pertinent labs & imaging results that were available during my care of the patient were reviewed by me and considered in my medical decision making (see chart for details).  Clinical Course as of Feb 07 734  Thu Feb 08, 2020  0620 Patient noted to be very agitated.  She is requiring assist to the bedside commode and noted to desat with minimal exertion.  She is refusing Covid swabbing.  Has yet to get her CT scan due to agitation.  Will give 0.5 mg of Ativan.   [CH]    Clinical Course User Index [CH] Ashanty Coltrane, Barbette Hair, MD   MDM Rules/Calculators/A&P                           Patient presents with shortness of breath.  She reports using as needed oxygen at home.  She is in no respiratory distress but does drop her O2 sats with minimal exertion.  Denies any infectious symptoms.  Considerations include but not limited to infection, pneumonia, heart failure although she does not appear volume overloaded, PE.  She does have some asymmetric lower extremity edema.  Lab work obtained.  No significant leukocytosis.  No significant metabolic derangements.  Chest x-ray with diffuse interstitial groundglass opacity which may represent edema or pneumonia.  Blood cultures placed.  Patient was given broad-spectrum antibiotics.  Will obtain CT given lower extremity findings to rule out PE and get a better evaluation of the lung parenchyma.  Covid swabbing also sent although she is fully vaccinated.  Disposition pending.  Final Clinical Impression(s) / ED Diagnoses Final diagnoses:  Shortness of breath    Rx / DC Orders ED Discharge Orders    None       Earleen Aoun, Barbette Hair, MD 02/08/20 [phone]

## 2020-02-08 NOTE — ED Notes (Signed)
All discharge instructions reviewed with pt. No questions verbalized at this time. Pt taken home via [facility].

## 2020-02-08 NOTE — Consult Note (Signed)
Consultation Note Date: 02/08/2020   Patient Name: Kristine Oliver  DOB: 1931/03/30  MRN: [id number]  Age / Sex: 86 y.o., female  PCP: Lavone Orn, MD Referring Physician: Drenda Freeze, MD  Reason for Consultation: Disposition and Establishing goals of care  HPI/Patient Profile: 86 y.o. female  with past medical history of CAD s/p stents in 2005 and 2019, chronic atrial fibrillation on Eliquis, chronic CHF, chronic kidney disease state III, diabetes, hypertension, hyperlipidemia, anxiety, and chronic back pain. She has been oxygen dependent since March 2020.   She presents to the ED from home on 02/07/2020 with shortness of breath.  Palliative care has been consulted by the ED provider to assist with goals of care and disposition.   Clinical Assessment and Goals of Care: I spoke with son Herbie [place] by phone. He reports his mom has been telling him she is at "end of life". He reports she can make her own decisions and he will honor her wishes.    I then met at bedside with patient to discuss diagnosis, prognosis, GOC, EOL wishes, disposition, and options. She states she has been dealing with her health issues for a "long time" and that she's tired. She states "I'm ready to meet the Lord. I just want to be comfortable." She reports progressive dyspnea with exertion over the last months. She also reports generalized pain that is not well-controlled with her current regimen. I   We discussed a brief life review of the patient. She is a retired Financial controller. Her only child Herbie Baltimore and daughter-in-law Cammie are very involved with her care. She lives in her own home with a roommate/caregiver, Harmon Pier, who is Cammie's brother. While I am at bedside, Cammie calls the room and lets me know there have been past issues with [place] using the patient's pain medication.   Hospice services were explained and offered. Patient  verbalizes she wants to go home with hospice. Daughter-in-law Cammie states their choice hospice agency is [facility].   Questions and concerns were addressed.  The family was encouraged to call with questions or concerns.   Primary decision maker: patient has capacity to make her own decision, but needs support from son and daughter-in-law   [place]   - DNR/DNI  - home with hospice - I spoke with [facility] liaison [place] regarding referral - plan of care discussed with Dr. Darl Householder, case management, and bedside RN  Code Status/Advance Care Planning:  DNR  Prognosis:   < 6 months  Discharge Planning: Home with Hospice      Primary Diagnoses: Present on Admission: **None**   I have reviewed the medical record, interviewed the patient and family, and examined the patient. The following aspects are pertinent.  Past Medical History:  Diagnosis Date  . Anxiety   . Atrial fibrillation ([place])   . Bradycardia   . CAD (coronary artery disease)    a. 08/2004 NSTEMI/PCI: LAD 35md (2.75 x 28 mm Taxus 2 DES);  b. 10/2010 Cath: LM nl, LAD patent stents w/  jailed septal, LCX 30, RCA 30; c. 06/2013 MV: No ischemia. Small fixed anteroseptal defect, ? scar vs atten. EF 62%; d. 03/2016 MV: EF 57%, Low risk.  . CHF (congestive heart failure) ([place])   . Chronic diastolic heart failure ([place])    a. Echo 7/13:  mod LVH, EF 65-70%, vigorous LV, Gr 1 diast dysfn, mild LAE; b. 05/2017 Echo: EF 60-65%, mod LVH, no rwma, Gr1 DD, mildly dil LA.  [place] Kitchen Chronic pain disorder   . CKD (chronic kidney disease), stage III   . DDD (degenerative disc disease), lumbosacral    , Spinal stenosis  . Diabetes mellitus ([place])    , Type II  . Diabetes mellitus without complication ([place])   . GERD (gastroesophageal reflux disease)   . HTN (hypertension)   . Hyperlipidemia   . Hypertension   . Hypothyroidism   . IBS (irritable bowel syndrome)   . Lumbar disc disease   . 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 Obesity (BMI 30-39.9)   . Peripheral neuropathy    , Associated with DM  . Scoliosis     Medications Prior to Admission:  Prior to Admission medications   Medication Sig Start Date End Date Taking? Authorizing Provider  albuterol (VENTOLIN HFA) 108 (90 Base) MCG/ACT inhaler Inhale 1 puff into the lungs every 4 (four) hours as needed for wheezing or shortness of breath.  09/25/19   [provider]  amLODipine (NORVASC) 2.5 MG tablet TAKE 1 TABLET(2.5 MG) BY MOUTH DAILY 01/11/20   Jerline Pain, MD  apixaban (ELIQUIS) 2.5 MG TABS tablet Take 1 tablet (2.5 mg total) by mouth 2 (two) times daily. 10/31/18   Duke, Tami Lin, PA  ASPERCREME LIDOCAINE EX Apply 1 application topically 2 (two) times daily as needed (back/feet pain).    [provider]  clindamycin (CLEOCIN T) 1 % lotion Apply 1 application topically 2 (two) times daily. Facial wash 11/16/13   [provider]  clopidogrel (PLAVIX) 75 MG tablet Take 1 tablet (75 mg total) by mouth daily. 03/23/18   Kroeger, Lorelee Cover., PA-C  clopidogrel (PLAVIX) 75 MG tablet Take 1 tablet by mouth every evening. 09/10/19   [provider]  Cyanocobalamin (VITAMIN B 12) 500 MCG TABS Take 1 tablet by mouth daily.    [provider]  cycloSPORINE (RESTASIS) 0.05 % ophthalmic emulsion Place 1 drop into both eyes 2 (two) times daily as needed (dry eyes).     [provider]  cycloSPORINE (RESTASIS) 0.05 % ophthalmic emulsion Place 1 drop into both eyes 2 (two) times daily as needed (dry eyes).    [provider]  docusate sodium (COLACE) 100 MG capsule Take 100 mg by mouth daily.    [provider]  ELIQUIS 2.5 MG TABS tablet Take 2.5 mg by mouth 2 (two) times daily. 08/24/19   [provider]  furosemide (LASIX) 20 MG tablet Take 1 tablet (20 mg total) by mouth daily. 11/11/19   Donne Hazel, MD  furosemide (LASIX) 20  MG tablet Take 1 tablet by mouth daily as needed for edema. 09/10/19   [provider]  gabapentin (NEURONTIN) 300 MG capsule Take 300 mg by mouth 2 (two) times daily.     [provider]  gabapentin (NEURONTIN) 300 MG capsule Take 300 mg by mouth 2 (two) times daily. 10/04/19   [provider]  hydrOXYzine (ATARAX/VISTARIL) 10 MG tablet Take 1 tablet (10 mg total) by  mouth 3 (three) times daily as needed for anxiety. 11/10/19   Donne Hazel, MD  isosorbide mononitrate (IMDUR) 30 MG 24 hr tablet Take 30 mg by mouth daily. 10/04/19   [provider]  levothyroxine (SYNTHROID) 75 MCG tablet Take 75 mcg by mouth daily. 11/29/19   [provider]  levothyroxine (SYNTHROID, LEVOTHROID) 75 MCG tablet Take 75 mcg by mouth daily.    [provider]  Melatonin 3 MG TABS Take 3 mg by mouth at bedtime as needed (sleep).    [provider]  metFORMIN (GLUCOPHAGE) 500 MG tablet Take 500 mg by mouth 2 (two) times daily with a meal.  03/17/16   [provider]  metFORMIN (GLUCOPHAGE) 500 MG tablet Take 500 mg by mouth 2 (two) times daily. 10/20/19   [provider]  nitroGLYCERIN (NITROSTAT) 0.4 MG SL tablet DISSOLVE 1 TABLET UNDER THE TONGUE EVERY 5 MINUTES FOR 3 DOSES AS NEEDED FOR CHEST PAIN Patient taking differently: Place 0.4 mg under the tongue every 5 (five) minutes x 3 doses as needed for chest pain.  01/23/19   Cheryln Manly, NP  nitroGLYCERIN (NITROSTAT) 0.4 MG SL tablet Place 1 tablet under the tongue as needed for chest pain. 11/02/19   [provider]  oxyCODONE 10 MG TABS Take 1 tablet (10 mg total) by mouth every 6 (six) hours as needed for moderate pain. Prescription needed for SNF placement 11/10/19   Donne Hazel, MD  Oxycodone HCl 10 MG TABS Take 10 mg by mouth 4 (four) times daily. 10/27/19   [provider]  pantoprazole (PROTONIX) 40 MG tablet Take 1 tablet (40 mg total) by mouth daily. 11/11/19   Donne Hazel, MD  pantoprazole (PROTONIX) 40 MG tablet Take 40 mg by mouth daily. 10/31/19   [provider]  ramipril (ALTACE) 5 MG capsule Take 5 mg by mouth daily. 10/29/19   [provider]  rosuvastatin (CRESTOR) 20 MG tablet TAKE 1 TABLET(20 MG) BY MOUTH DAILY AT 6 PM Patient taking differently: Take 20 mg by mouth every evening.  05/26/19   Kroeger, Lorelee Cover., PA-C  rosuvastatin (CRESTOR) 20 MG tablet Take 20 mg by mouth at bedtime. 08/28/19   [provider]  sodium chloride (OCEAN) 0.65 % SOLN nasal spray Place 1 spray into both nostrils as needed for congestion (dryness).    [provider]  valACYclovir (VALTREX) 500 MG tablet Take 500 mg by mouth 2 (two) times daily.     [provider]  valACYclovir (VALTREX) 500 MG tablet Take 500 mg by mouth 2 (two) times daily. 07/24/19   [provider]  vitamin B-12 (CYANOCOBALAMIN) 500 MCG tablet Take 500 mcg by mouth daily.    [provider]   Allergies  Allergen Reactions  . Pravastatin Other (See Comments)    Muscle aches  . Prednisone Nausea And Vomiting  . Prednisone    Review of Systems  Constitutional:       Generalized pain  Respiratory: Positive for shortness of breath.   Musculoskeletal: Positive for back pain.  Neurological: Positive for weakness.    Physical Exam Constitutional:      General: She is not in acute distress.    Comments: Chronically ill-appearing  Cardiovascular:     Rate and Rhythm: Normal rate.     Comments: A-fib on monitor Pulmonary:     Effort: Pulmonary effort is normal.     Comments: O2 at 3L Neurological:  Mental Status: She is alert and oriented to person, place, and time.     Vital Signs: BP (!) 145/63   Pulse 79   Temp 98.1 F (36.7 C) (Oral)   Resp 17   Ht _0  (1.499 m)   Wt 64 kg   SpO2 96%   BMI 28.50 kg/m  Pain Scale: 0-10   Pain Score: 9    SpO2: SpO2: 96 % O2 Device:SpO2: 96 % O2 Flow Rate: .O2 Flow  Rate (L/min): 2 L/min  IO: Intake/output summary: No intake or output data in the 24 hours ending 02/08/20 1650  LBM:   Baseline Weight: Weight: 64 kg Most recent weight: Weight: 64 kg      Palliative Assessment/Data: 40-50%     Time In: 16:00 Time Out: 16:50 Time Total: 50 minutes  Greater than 50%  of this time was spent counseling and coordinating care related to the above assessment and plan.  Signed by: Lavena Bullion, NP   Please contact Palliative Medicine Team phone at [phone] for questions and concerns.  For individual provider: See Shea Evans

## 2020-02-08 NOTE — ED Notes (Signed)
Pt reports taking Oxycodone q4 at home, pt reports last dose 02/07/20 around 4pm. Pt reports she was unable to take Oxycodone due to family member stealing prescribed Oxycodone. Pt redirected several times.

## 2020-02-08 NOTE — ED Notes (Signed)
Pt yelling and screaming at this time as nurse attempts to reposition pt back in bed safely this RN attempting to redirect pt. Pt asked to not use profanity with staff and made aware the bedrails are placed up for safety, this RN is unable to verbalized understanding at this time from pt. Continuous cardiac monitoring in progress.

## 2020-02-08 NOTE — ED Notes (Signed)
CALLED [facility] FOR TRANSPORT

## 2020-02-08 NOTE — ED Notes (Signed)
Pt up to side of bed at this time, this RN and tech at bedside to assist pt to bedside commode, pt screaming and refusing to put on non-skid socks. Pt unable to stand without assistance, SPO2 decreased to 88% with transferring. Pt redirected to place on nasal cannula, pt refusing to wear nasal cannula this time.

## 2020-02-08 NOTE — Discharge Planning (Signed)
Transition of Care [facility]) - Emergency Department Mini Assessment   Patient Details  Name: Kristine Oliver MRN: [id number] Date of Birth: April 28, 1931  [facility] CM/SW Contact:    Fuller Mandril, RN Phone Number: 02/08/2020, 2:50 PM   Clinical Narrative:  RNCM consulted regarding helping pt and family change DME oxygen company.  RNCM reached out to Bethanne Ginger of [place] to find that pt needs to coordinate with current company and [employer] to switch out equipment.  ERCM relayed instructions to Jonni Sanger, who will take care of this matter.  ED Mini Assessment: What brought you to the Emergency Department? : shortness of breath  Barriers to Discharge: Barriers Resolved  Barrier interventions: Instruction on change DME company     Interventions which prevented an admission or readmission: DME Provided    Patient Contact and Communications     Spoke with: Celene Squibb Date: 02/08/20,   Contact time: 1418 Contact Phone Number: [phone] Call outcome: spoke with  Patient states their goals for this hospitalization and ongoing recovery are:: go home      Admission diagnosis:  Shortness of Breath  Patient Active Problem List   Diagnosis Date Noted  . Palliative care by specialist   . DNR (do not resuscitate)   . Acute respiratory failure ([place]) 11/05/2019  . Acute on chronic diastolic CHF (congestive heart failure) ([place]) 11/05/2019  . Syncope 11/05/2019  . Adjustment disorder with depressed mood   . Stable angina ([place]) 10/10/2019  . CKD (chronic kidney disease) stage 3, GFR 30-59 ml/min 10/09/2019  . AKI (acute kidney injury) ([place])   . Unstable angina ([place]) 10/29/2018  . Persistent atrial fibrillation ([place])   . Status post coronary artery stent placement   . NSTEMI (non-ST elevated myocardial infarction) ([place]) 03/18/2018  . Essential hypertension 05/19/2016  . Chest pain 05/18/2016  . Bradycardia 09/26/2014  . Sinus bradycardia 10/30/2013  . Weakness  generalized 10/30/2013  . Chronic diastolic CHF (congestive heart failure) ([place]) 06/26/2013  . CAD (coronary artery disease) 02/12/2012  . Hypercalcemia 02/12/2012  . Chronic pain disorder   . Hypothyroidism   . Type 2 diabetes mellitus with vascular disease ([place])   . Hyperlipidemia associated with type 2 diabetes mellitus ([place])   . Lumbar disc disease   . Obesity (BMI 30-39.9)   . Hypertensive heart disease without CHF   . Bipolar affective disorder ([place])   . Scoliosis    PCP:  Lavone Orn, MD Pharmacy:   [facility] [address] [facility] [place] [address] [place] [address] Phone: [phone] Fax: [facility], [address] AT [facility] [address] [place] [address] Phone: [phone] Fax: [phone]

## 2020-02-08 NOTE — Progress Notes (Addendum)
Manufacturing engineer [facility]) **Addendum: Per Dr. Tomasa Hosteller, [facility] MD, pt is deemed eligible for hospice services.**  Received request from [facility] for hospice services at home after discharge.  Chart and pt information under review by [facility] physician.  Hospice eligibility pending at this time.  Hospital liaison spoke with Cammie, pt's daughter-in-law, to initiate education related to hospice philosophy and services and to answer any questions at this time.  Cammie verbalized understanding of information given.  Per discussion the plan is to discharge home today by [facility].    Please provide prescriptions at discharge as needed to ensure ongoing symptom management.    DME needs discussed.  Family denies any DME needs at this time. Address has been verified and is correct in the chart.  [facility] information and contact numbers given to Cammie.  Above information shared with Janae Bridgeman Manager.  Please call with any questions or concerns.  Thank you for the opportunity to participate in this patient's care.  Domenic Moras, BSN, RN [employer] [phone] (24h on call)

## 2020-02-08 NOTE — Care Management (Addendum)
Referral for hospice has been confirmed with [facility] as per [facility] RN liaison, updated Dr. Darl Householder. Patient will be transported home with [facility], Updated Cammie Johnson daughter-in-law.

## 2020-02-08 NOTE — ED Notes (Signed)
Palliative care at bedside.

## 2020-02-08 NOTE — ED Notes (Signed)
Pt visibly upset, pt refused COVID testing. Pt made aware of importance of test for admission to hospital to receive treatment. [place], MD made aware.

## 2020-02-13 LAB — CULTURE, BLOOD (ROUTINE X 2)
Culture: NO GROWTH
Culture: NO GROWTH
Special Requests: ADEQUATE
Special Requests: ADEQUATE

## 2020-02-27 DIAGNOSIS — J849 Interstitial pulmonary disease, unspecified: Secondary | ICD-10-CM | POA: Insufficient documentation

## 2020-02-27 DIAGNOSIS — J9611 Chronic respiratory failure with hypoxia: Secondary | ICD-10-CM | POA: Insufficient documentation

## 2020-02-27 DIAGNOSIS — R0600 Dyspnea, unspecified: Secondary | ICD-10-CM | POA: Insufficient documentation

## 2020-02-27 DIAGNOSIS — Z7189 Other specified counseling: Secondary | ICD-10-CM | POA: Insufficient documentation

## 2020-04-05 DIAGNOSIS — I5030 Unspecified diastolic (congestive) heart failure: Secondary | ICD-10-CM | POA: Diagnosis not present

## 2020-04-26 DIAGNOSIS — I5030 Unspecified diastolic (congestive) heart failure: Secondary | ICD-10-CM | POA: Diagnosis not present

## 2020-05-16 DIAGNOSIS — M2550 Pain in unspecified joint: Secondary | ICD-10-CM | POA: Diagnosis not present

## 2020-05-16 DIAGNOSIS — R6889 Other general symptoms and signs: Secondary | ICD-10-CM | POA: Diagnosis not present

## 2020-05-16 DIAGNOSIS — Z7401 Bed confinement status: Secondary | ICD-10-CM | POA: Diagnosis not present

## 2020-05-16 DIAGNOSIS — Z743 Need for continuous supervision: Secondary | ICD-10-CM | POA: Diagnosis not present

## 2020-05-16 DIAGNOSIS — W19XXXA Unspecified fall, initial encounter: Secondary | ICD-10-CM | POA: Diagnosis not present

## 2020-05-18 ENCOUNTER — Other Ambulatory Visit: Payer: Self-pay | Admitting: Cardiology

## 2020-05-21 NOTE — Telephone Encounter (Signed)
Pt's pharmacy is requesting a refill on pantoprazole. Would Dr. Marlou Porch like to refill this medication? Please address

## 2020-06-06 DIAGNOSIS — I5030 Unspecified diastolic (congestive) heart failure: Secondary | ICD-10-CM | POA: Diagnosis not present

## 2020-07-13 ENCOUNTER — Encounter (HOSPITAL_COMMUNITY): Payer: Self-pay

## 2020-07-13 ENCOUNTER — Emergency Department (HOSPITAL_COMMUNITY)

## 2020-07-13 ENCOUNTER — Other Ambulatory Visit: Payer: Self-pay

## 2020-07-13 ENCOUNTER — Emergency Department (HOSPITAL_COMMUNITY)
Admission: EM | Admit: 2020-07-13 | Discharge: 2020-07-14 | Disposition: A | Discharge status: Other Acute Inpatient Hospital | Attending: Emergency Medicine | Admitting: Emergency Medicine

## 2020-07-13 DIAGNOSIS — Z955 Presence of coronary angioplasty implant and graft: Secondary | ICD-10-CM | POA: Insufficient documentation

## 2020-07-13 DIAGNOSIS — E1122 Type 2 diabetes mellitus with diabetic chronic kidney disease: Secondary | ICD-10-CM | POA: Diagnosis not present

## 2020-07-13 DIAGNOSIS — N183 Chronic kidney disease, stage 3 unspecified: Secondary | ICD-10-CM | POA: Diagnosis not present

## 2020-07-13 DIAGNOSIS — E039 Hypothyroidism, unspecified: Secondary | ICD-10-CM | POA: Diagnosis not present

## 2020-07-13 DIAGNOSIS — E1140 Type 2 diabetes mellitus with diabetic neuropathy, unspecified: Secondary | ICD-10-CM | POA: Insufficient documentation

## 2020-07-13 DIAGNOSIS — Z20822 Contact with and (suspected) exposure to covid-19: Secondary | ICD-10-CM | POA: Insufficient documentation

## 2020-07-13 DIAGNOSIS — K2091 Esophagitis, unspecified with bleeding: Secondary | ICD-10-CM | POA: Insufficient documentation

## 2020-07-13 DIAGNOSIS — I130 Hypertensive heart and chronic kidney disease with heart failure and stage 1 through stage 4 chronic kidney disease, or unspecified chronic kidney disease: Secondary | ICD-10-CM | POA: Insufficient documentation

## 2020-07-13 DIAGNOSIS — R1110 Vomiting, unspecified: Secondary | ICD-10-CM | POA: Diagnosis present

## 2020-07-13 DIAGNOSIS — I5033 Acute on chronic diastolic (congestive) heart failure: Secondary | ICD-10-CM | POA: Diagnosis not present

## 2020-07-13 DIAGNOSIS — I4891 Unspecified atrial fibrillation: Secondary | ICD-10-CM | POA: Insufficient documentation

## 2020-07-13 DIAGNOSIS — I25119 Atherosclerotic heart disease of native coronary artery with unspecified angina pectoris: Secondary | ICD-10-CM | POA: Insufficient documentation

## 2020-07-13 DIAGNOSIS — Z79899 Other long term (current) drug therapy: Secondary | ICD-10-CM | POA: Diagnosis not present

## 2020-07-13 DIAGNOSIS — Z7901 Long term (current) use of anticoagulants: Secondary | ICD-10-CM | POA: Diagnosis not present

## 2020-07-13 DIAGNOSIS — Z7984 Long term (current) use of oral hypoglycemic drugs: Secondary | ICD-10-CM | POA: Diagnosis not present

## 2020-07-13 DIAGNOSIS — K209 Esophagitis, unspecified without bleeding: Secondary | ICD-10-CM

## 2020-07-13 LAB — I-STAT CHEM 8, ED
BUN: 33 mg/dL — ABNORMAL HIGH (ref 8–23)
Calcium, Ion: 1.48 mmol/L — ABNORMAL HIGH (ref 1.15–1.40)
Chloride: 101 mmol/L (ref 98–111)
Creatinine, Ser: 1 mg/dL (ref 0.44–1.00)
Glucose, Bld: 115 mg/dL — ABNORMAL HIGH (ref 70–99)
HCT: 55 % — ABNORMAL HIGH (ref 36.0–46.0)
Hemoglobin: 18.7 g/dL — ABNORMAL HIGH (ref 12.0–15.0)
Potassium: 4.8 mmol/L (ref 3.5–5.1)
Sodium: 138 mmol/L (ref 135–145)
TCO2: 27 mmol/L (ref 22–32)

## 2020-07-13 LAB — COMPREHENSIVE METABOLIC PANEL
ALT: 17 U/L (ref 0–44)
AST: 32 U/L (ref 15–41)
Albumin: 3.6 g/dL (ref 3.5–5.0)
Alkaline Phosphatase: 190 U/L — ABNORMAL HIGH (ref 38–126)
Anion gap: 15 (ref 5–15)
BUN: 28 mg/dL — ABNORMAL HIGH (ref 8–23)
CO2: 23 mmol/L (ref 22–32)
Calcium: 12 mg/dL — ABNORMAL HIGH (ref 8.9–10.3)
Chloride: 99 mmol/L (ref 98–111)
Creatinine, Ser: 1.16 mg/dL — ABNORMAL HIGH (ref 0.44–1.00)
GFR, Estimated: 46 mL/min — ABNORMAL LOW (ref >60)
Glucose, Bld: 118 mg/dL — ABNORMAL HIGH (ref 70–99)
Potassium: 4.9 mmol/L (ref 3.5–5.1)
Sodium: 137 mmol/L (ref 135–145)
Total Bilirubin: 0.8 mg/dL (ref 0.3–1.2)
Total Protein: 7.8 g/dL (ref 6.5–8.1)

## 2020-07-13 LAB — LIPASE, BLOOD: Lipase: 38 U/L (ref 11–51)

## 2020-07-13 LAB — CBC WITH DIFFERENTIAL/PLATELET
Abs Immature Granulocytes: 0.05 10*3/uL (ref 0.00–0.07)
Basophils Absolute: 0 10*3/uL (ref 0.0–0.1)
Basophils Relative: 0 %
Eosinophils Absolute: 0 10*3/uL (ref 0.0–0.5)
Eosinophils Relative: 0 %
HCT: 52.9 % — ABNORMAL HIGH (ref 36.0–46.0)
Hemoglobin: 15.8 g/dL — ABNORMAL HIGH (ref 12.0–15.0)
Immature Granulocytes: 1 %
Lymphocytes Relative: 13 %
Lymphs Abs: 1.2 10*3/uL (ref 0.7–4.0)
MCH: 25.6 pg — ABNORMAL LOW (ref 26.0–34.0)
MCHC: 29.9 g/dL — ABNORMAL LOW (ref 30.0–36.0)
MCV: 85.9 fL (ref 80.0–100.0)
Monocytes Absolute: 0.5 10*3/uL (ref 0.1–1.0)
Monocytes Relative: 5 %
Neutro Abs: 7.9 10*3/uL — ABNORMAL HIGH (ref 1.7–7.7)
Neutrophils Relative %: 81 %
Platelets: 303 10*3/uL (ref 150–400)
RBC: 6.16 MIL/uL — ABNORMAL HIGH (ref 3.87–5.11)
RDW: 17.2 % — ABNORMAL HIGH (ref 11.5–15.5)
WBC: 9.7 10*3/uL (ref 4.0–10.5)
nRBC: 0 % (ref 0.0–0.2)

## 2020-07-13 MED ORDER — IOHEXOL 300 MG/ML  SOLN
100.0000 mL | Freq: Once | INTRAMUSCULAR | Status: AC | PRN
  Administered 2020-07-13: 100 mL via INTRAVENOUS

## 2020-07-13 MED ORDER — SODIUM CHLORIDE 0.9 % IV SOLN: Freq: Once | INTRAVENOUS | Status: AC

## 2020-07-13 MED ORDER — SODIUM CHLORIDE 0.9 % IV BOLUS
500.0000 mL | Freq: Once | INTRAVENOUS | Status: AC
  Administered 2020-07-13: 500 mL via INTRAVENOUS

## 2020-07-13 MED ORDER — MORPHINE SULFATE (PF) 4 MG/ML IV SOLN
4.0000 mg | INTRAVENOUS | Status: DC | PRN
  Administered 2020-07-14: 4 mg via INTRAVENOUS
  Filled 2020-07-13: qty 1

## 2020-07-13 MED ORDER — PANTOPRAZOLE SODIUM 40 MG IV SOLR
40.0000 mg | Freq: Once | INTRAVENOUS | Status: AC
  Administered 2020-07-13: 40 mg via INTRAVENOUS
  Filled 2020-07-13: qty 40

## 2020-07-13 MED ORDER — ONDANSETRON HCL 4 MG/2ML IJ SOLN
4.0000 mg | Freq: Four times a day (QID) | INTRAMUSCULAR | Status: DC | PRN
  Administered 2020-07-14: 10:00:00 4 mg via INTRAVENOUS
  Filled 2020-07-13: qty 2

## 2020-07-13 MED ORDER — ONDANSETRON HCL 4 MG/2ML IJ SOLN
4.0000 mg | Freq: Once | INTRAMUSCULAR | Status: AC
  Administered 2020-07-13: 4 mg via INTRAVENOUS
  Filled 2020-07-13: qty 2

## 2020-07-13 MED ORDER — MORPHINE SULFATE (PF) 4 MG/ML IV SOLN
4.0000 mg | Freq: Once | INTRAVENOUS | Status: AC
  Administered 2020-07-13: 4 mg via INTRAVENOUS
  Filled 2020-07-13: qty 1

## 2020-07-13 NOTE — ED Provider Notes (Addendum)
Kristine Oliver   CSN: [id number] Arrival date & time: 07/13/20  2039     History Chief Complaint  Patient presents with  . Emesis    Kristine Oliver is a 86 y.o. female hx of CAD, CHF, CKD, hospice patient here presenting with vomiting.  Patient is at home and supposed to go to [facility] for hospice.  Per the caregiver, patient apparently had 3 episodes of vomiting feculent material.  Patient states that she has pain all over.  She was given morphine prior to arrival by the caregiver.  Vitals are stable per EMS  The history is provided by the patient, a relative and the EMS personnel.       Past Medical History:  Diagnosis Date  . Anxiety   . Atrial fibrillation ([place])   . Bradycardia   . CAD (coronary artery disease)    a. 08/2004 NSTEMI/PCI: LAD 103m/d (2.75 x 28 mm Taxus 2 DES);  b. 10/2010 Cath: LM nl, LAD patent stents w/ jailed septal, LCX 30, RCA 30; c. 06/2013 MV: No ischemia. Small fixed anteroseptal defect, ? scar vs atten. EF 62%; d. 03/2016 MV: EF 57%, Low risk.  . CHF (congestive heart failure) ([place])   . Chronic diastolic heart failure ([place])    a. Echo 7/13:  mod LVH, EF 65-70%, vigorous LV, Gr 1 diast dysfn, mild LAE; b. 05/2017 Echo: EF 60-65%, mod LVH, no rwma, Gr1 DD, mildly dil LA.  Marland Kitchen Chronic pain disorder   . CKD (chronic kidney disease), stage III ([place])   . DDD (degenerative disc disease), lumbosacral    , Spinal stenosis  . Diabetes mellitus ([place])    , Type II  . Diabetes mellitus without complication ([place])   . GERD (gastroesophageal reflux disease)   . HTN (hypertension)   . Hyperlipidemia   . Hypertension   . Hypothyroidism   . IBS (irritable bowel syndrome)   . Lumbar disc disease   . Nonproliferative diabetic retinopathy associated with type 2 diabetes mellitus ([place])   . Obesity (BMI 30-39.9)   . Peripheral neuropathy    , Associated with DM  . Scoliosis     Patient Active Problem List    Diagnosis Date Noted  . Chronic respiratory failure with hypoxia ([place])   . Interstitial lung disease ([place])   . Dyspnea   . Goals of care, counseling/discussion   . Palliative care by specialist   . DNR (do not resuscitate)   . Acute respiratory failure ([place]) 11/05/2019  . Acute on chronic diastolic CHF (congestive heart failure) ([place]) 11/05/2019  . Syncope 11/05/2019  . Adjustment disorder with depressed mood   . Stable angina ([place]) 10/10/2019  . CKD (chronic kidney disease) stage 3, GFR 30-59 ml/min (HCC) 10/09/2019  . AKI (acute kidney injury) ([place])   . Unstable angina ([place]) 10/29/2018  . Persistent atrial fibrillation ([place])   . Status post coronary artery stent placement   . NSTEMI (non-ST elevated myocardial infarction) ([place]) 03/18/2018  . Essential hypertension 05/19/2016  . Chest pain 05/18/2016  . Bradycardia 09/26/2014  . Sinus bradycardia 10/30/2013  . Weakness generalized 10/30/2013  . Chronic diastolic heart failure ([place]) 06/26/2013  . CAD (coronary artery disease) 02/12/2012  . Hypercalcemia 02/12/2012  . Chronic pain disorder   . Hypothyroidism   . Type 2 diabetes mellitus with vascular disease ([place])   . Hyperlipidemia associated with type 2 diabetes mellitus ([place])   . Lumbar disc disease   .  Obesity (BMI 30-39.9)   . Hypertensive heart disease without CHF   . Bipolar affective disorder ([place])   . Scoliosis     Past Surgical History:  Procedure Laterality Date  . BACK SURGERY    . CARDIAC CATHETERIZATION    . CARDIOVERSION N/A 08/25/2018   Procedure: CARDIOVERSION;  Surgeon: Jerline Pain, MD;  Location: [facility] ENDOSCOPY;  Service: Cardiovascular;  Laterality: N/A;  . CHOLECYSTECTOMY    . CORONARY STENT INTERVENTION N/A 03/21/2018   Procedure: CORONARY STENT INTERVENTION;  Surgeon: Jettie Booze, MD;  Location: [facility] CV LAB;  Service: Cardiovascular;  Laterality: N/A;  . DILATION AND CURETTAGE OF UTERUS    . HEMILAMINOTOMY LUMBAR SPINE  2002   Rt  L5, with decompression and foraminotomy  . INDUCED ABORTION     , x2  . LAMINOTOMY Right   . LEFT HEART CATH AND CORONARY ANGIOGRAPHY N/A 03/21/2018   Procedure: LEFT HEART CATH AND CORONARY ANGIOGRAPHY;  Surgeon: Jettie Booze, MD;  Location: [facility] CV LAB;  Service: Cardiovascular;  Laterality: N/A;  . LUMBAR [place] SURGERY  07/1999   L5-S1  . PILONIDAL CYST EXCISION    . ROTATOR CUFF REPAIR    . TONSILLECTOMY    . TOTAL ABDOMINAL HYSTERECTOMY W/ BILATERAL SALPINGOOPHORECTOMY       OB History   No obstetric history on file.     Family History  Problem Relation Age of Onset  . Heart failure Father   . Diabetes Father   . Coronary artery disease Mother     Social History   Tobacco Use  . Smoking status: Never Smoker  . Smokeless tobacco: Never Used  Vaping Use  . Vaping Use: Never used  Substance Use Topics  . Alcohol use: Yes    Alcohol/week: 1.0 standard drink    Types: 1 Glasses of wine per week    Comment: social drinking, not often  . Drug use: No    Home Medications Prior to Admission medications   Medication Sig Start Date End Date Taking? Authorizing Provider  albuterol (VENTOLIN HFA) 108 (90 Base) MCG/ACT inhaler Inhale 1 puff into the lungs every 4 (four) hours as needed for wheezing or shortness of breath.  09/25/19   [provider]  amLODipine (NORVASC) 2.5 MG tablet TAKE 1 TABLET(2.5 MG) BY MOUTH DAILY 01/11/20   Jerline Pain, MD  apixaban (ELIQUIS) 2.5 MG TABS tablet Take 1 tablet (2.5 mg total) by mouth 2 (two) times daily. 10/31/18   Duke, Tami Lin, PA  ASPERCREME LIDOCAINE EX Apply 1 application topically 2 (two) times daily as needed (back/feet pain).    [provider]  clindamycin (CLEOCIN T) 1 % lotion Apply 1 application topically 2 (two) times daily. Facial wash 11/16/13   [provider]  clopidogrel (PLAVIX) 75 MG tablet Take 1 tablet (75 mg total) by mouth daily. 03/23/18   Kroeger, Lorelee Cover., PA-C   clopidogrel (PLAVIX) 75 MG tablet Take 1 tablet by mouth every evening. 09/10/19   [provider]  Cyanocobalamin (VITAMIN B 12) 500 MCG TABS Take 1 tablet by mouth daily.    [provider]  cycloSPORINE (RESTASIS) 0.05 % ophthalmic emulsion Place 1 drop into both eyes 2 (two) times daily as needed (dry eyes).     [provider]  cycloSPORINE (RESTASIS) 0.05 % ophthalmic emulsion Place 1 drop into both eyes 2 (two) times daily as needed (dry eyes).    [provider]  docusate sodium (COLACE)  100 MG capsule Take 100 mg by mouth daily.    [provider]  ELIQUIS 2.5 MG TABS tablet Take 2.5 mg by mouth 2 (two) times daily. 08/24/19   [provider]  furosemide (LASIX) 20 MG tablet Take 1 tablet (20 mg total) by mouth daily. 11/11/19   Donne Hazel, MD  furosemide (LASIX) 20 MG tablet Take 1 tablet by mouth daily as needed for edema. 09/10/19   [provider]  gabapentin (NEURONTIN) 300 MG capsule Take 300 mg by mouth 2 (two) times daily.     [provider]  gabapentin (NEURONTIN) 300 MG capsule Take 300 mg by mouth 2 (two) times daily. 10/04/19   [provider]  hydrOXYzine (ATARAX/VISTARIL) 10 MG tablet Take 1 tablet (10 mg total) by mouth 3 (three) times daily as needed for anxiety. 11/10/19   Donne Hazel, MD  isosorbide mononitrate (IMDUR) 30 MG 24 hr tablet Take 30 mg by mouth daily. 10/04/19   [provider]  levothyroxine (SYNTHROID) 75 MCG tablet Take 75 mcg by mouth daily. 11/29/19   [provider]  levothyroxine (SYNTHROID, LEVOTHROID) 75 MCG tablet Take 75 mcg by mouth daily.    [provider]  Melatonin 3 MG TABS Take 3 mg by mouth at bedtime as needed (sleep).    [provider]  metFORMIN (GLUCOPHAGE) 500 MG tablet Take 500 mg by mouth 2 (two) times daily with a meal.  03/17/16   [provider]  metFORMIN (GLUCOPHAGE) 500 MG tablet Take 500 mg by mouth 2  (two) times daily. 10/20/19   [provider]  nitroGLYCERIN (NITROSTAT) 0.4 MG SL tablet DISSOLVE 1 TABLET UNDER THE TONGUE EVERY 5 MINUTES FOR 3 DOSES AS NEEDED FOR CHEST PAIN Patient taking differently: Place 0.4 mg under the tongue every 5 (five) minutes x 3 doses as needed for chest pain.  01/23/19   Cheryln Manly, NP  nitroGLYCERIN (NITROSTAT) 0.4 MG SL tablet Place 1 tablet under the tongue as needed for chest pain. 11/02/19   [provider]  oxyCODONE 10 MG TABS Take 1 tablet (10 mg total) by mouth every 6 (six) hours as needed for moderate pain. Prescription needed for SNF placement 11/10/19   Donne Hazel, MD  Oxycodone HCl 10 MG TABS Take 10 mg by mouth 4 (four) times daily. 10/27/19   [provider]  pantoprazole (PROTONIX) 40 MG tablet Take 1 tablet (40 mg total) by mouth daily. 05/21/20   Jerline Pain, MD  ramipril (ALTACE) 5 MG capsule Take 5 mg by mouth daily. 10/29/19   [provider]  rosuvastatin (CRESTOR) 20 MG tablet TAKE 1 TABLET(20 MG) BY MOUTH DAILY AT 6 PM Patient taking differently: Take 20 mg by mouth every evening.  05/26/19   Kroeger, Lorelee Cover., PA-C  rosuvastatin (CRESTOR) 20 MG tablet Take 20 mg by mouth at bedtime. 08/28/19   [provider]  sodium chloride (OCEAN) 0.65 % SOLN nasal spray Place 1 spray into both nostrils as needed for congestion (dryness).    [provider]  valACYclovir (VALTREX) 500 MG tablet Take 500 mg by mouth 2 (two) times daily.     [provider]  valACYclovir (VALTREX) 500 MG tablet Take 500 mg by mouth 2 (two) times daily. 07/24/19   [provider]  vitamin B-12 (CYANOCOBALAMIN) 500 MCG tablet Take 500 mcg by mouth daily.    [provider]    Allergies    Pravastatin, Prednisone,  and Prednisone  Review of Systems   Review of Systems  Gastrointestinal: Positive for vomiting.  All other systems reviewed and are negative.   Physical Exam Updated  Vital Signs BP (!) 153/89 (BP Location: Right Arm)   Pulse 76   Temp 97.6 F (36.4 C) (Oral)   Resp 16   Ht 4\' 11"  (1.499 m)   Wt 64 kg   SpO2 95%   BMI 28.50 kg/m   Physical Exam Vitals and nursing Oliver reviewed.  Constitutional:      Comments: Chronically ill appearing   HENT:     Head: Normocephalic.     Mouth/Throat:     Mouth: Mucous membranes are dry.     Comments: Brownish material at the mouth  Eyes:     Extraocular Movements: Extraocular movements intact.     Pupils: Pupils are equal, round, and reactive to light.  Cardiovascular:     Rate and Rhythm: Normal rate and regular rhythm.     Pulses: Normal pulses.     Heart sounds: Normal heart sounds.  Pulmonary:     Effort: Pulmonary effort is normal.     Breath sounds: Normal breath sounds.  Abdominal:     Comments: Distended, mild diffuse tenderness   Musculoskeletal:        General: Normal range of motion.     Cervical back: Normal range of motion and neck supple.  Skin:    General: Skin is warm.     Capillary Refill: Capillary refill takes less than 2 seconds.  Neurological:     General: No focal deficit present.     Comments: Confused, moving all extremities   Psychiatric:        Mood and Affect: Mood normal.     ED Results / Procedures / Treatments   Labs (all labs ordered are listed, but only abnormal results are displayed) Labs Reviewed  I-STAT CHEM 8, ED - Abnormal; Notable for the following components:      Result Value   BUN 33 (*)    Glucose, Bld 115 (*)    Calcium, Ion 1.48 (*)    Hemoglobin 18.7 (*)    HCT 55.0 (*)    All other components within normal limits  CBC WITH DIFFERENTIAL/PLATELET  COMPREHENSIVE METABOLIC PANEL  LIPASE, BLOOD    EKG EKG Interpretation  Date/Time:  Saturday July 13 2020 20:45:40 EST Ventricular Rate:  86 PR Interval:    QRS Duration: 92 QT Interval:  376 QTC Calculation: 450 R Axis:   10 Text Interpretation: Atrial fibrillation Borderline  repolarization abnormality No significant change since last tracing Confirmed by Wandra Arthurs [phone]) on 07/13/2020 9:08:30 PM   Radiology DG Chest Port 1 View  Result Date: 07/13/2020 CLINICAL DATA:  Nausea and vomiting. EXAM: PORTABLE CHEST 1 VIEW COMPARISON:  February 07, 2020 FINDINGS: There are persistent prominent interstitial lung markings bilaterally. The lung volumes are low. There is mild interstitial edema. The heart size is stable. Aortic calcifications are noted. There is no pneumothorax. No significant pleural effusion. IMPRESSION: 1. No acute cardiopulmonary process. 2. Persistent prominent interstitial lung markings. Differential considerations include an atypical infectious process or pulmonary fibrosis. Electronically Signed   By: Constance Holster M.D.   On: 07/13/2020 21:08   DG Abd Portable 1 View  Result Date: 07/13/2020 CLINICAL DATA:  Vomiting EXAM: PORTABLE ABDOMEN - 1 VIEW COMPARISON:  None. FINDINGS: The bowel gas pattern is nonspecific with mild gaseous distention of loops of small bowel and colon  scattered throughout the abdomen. There is no evidence for high-grade small bowel obstruction. No pneumatosis. No definite free air. There are degenerative changes throughout the lumbar spine and bilateral hips. IMPRESSION: Nonspecific bowel gas pattern with mild gaseous distention of loops of small bowel and colon scattered throughout the abdomen. Electronically Signed   By: Constance Holster M.D.   On: 07/13/2020 21:09    Procedures Procedures (including critical care time)  Medications Ordered in ED Medications  sodium chloride 0.9 % bolus 500 mL (500 mLs Intravenous Bolus 07/13/20 2108)  ondansetron (ZOFRAN) injection 4 mg (4 mg Intravenous Given 07/13/20 2108)  morphine 4 MG/ML injection 4 mg (4 mg Intravenous Given 07/13/20 2108)    ED Course  I have reviewed the triage vital signs and the nursing notes.  Pertinent labs & imaging results that were available during  my care of the patient were reviewed by me and considered in my medical decision making (see chart for details).    MDM Rules/Calculators/A&P                         Kristine Oliver is a 86 y.o. female here with vomiting. Patient is in hospice and has DNR. Will get labs, CT ab/pel to r/o SBO. Will contact family and discuss goals of care   10:49 PM I tried multiple times to reach family unable to reach anyone.  CT showed esophagitis.  Patient is moaning in pain and still not eating and drinking.  I am unclear when she is supposed to go to inpatient hospice.  I ordered some Protonix.  At this point will admit for hydration and case management likely need to follow-up about getting patient into inpatient hospice.  11:21 PM  Talked to Dr. Alcario Drought from hospitalist. He reviewed chart and contacted hospice nurse. She has a bed at [facility] place in the morning. They request COVID test. Will order zofran, morphine prn. Consulted social work to follow up in the morning.    Final Clinical Impression(s) / ED Diagnoses Final diagnoses:  None    Rx / DC Orders ED Discharge Orders    None       Drenda Freeze, MD 07/13/20 2257    Drenda Freeze, MD 07/13/20 [phone]

## 2020-07-13 NOTE — ED Triage Notes (Signed)
Patient arrives from home with [facility] EMS, caregiver reports patient vomited x 3 what appeared to be feces, patient is on home hospice. Caregiver gave morphine PTA, unknown dose  EMS vitals  110/80 70 HR 16 RR CBG 138

## 2020-07-13 NOTE — Progress Notes (Addendum)
Spoke with [facility] hospice at [facility].  Confirmed, patient does have bed at [facility] for tomorrow.  Getting covid test as per RN request before she can be transferred.  EDP and I are unable to get a-hold of family, but feel it is reasonable to assume that pt is full comfort measures / "on hospice" at this point given the [facility] hospice note from earlier today in patients chart and that patient has gotten all the way to having an inpatient hospice bed ready for her tomorrow.

## 2020-07-13 NOTE — Progress Notes (Signed)
[facility] Documentation  Pt is a current pt of [facility] hospice with terminal illnesses stage 4 CKD and heart failure.   Pt's family called [facility] around 6am today stating pt projectile vomiting. NP called in Zofran with no relief so a nurse visited and noted pt with excessive saliva, vomiting and moaning in pain ; levsin, compazine, atropine and roxanol ordered. Family believes that pt had a BM early this morning but pt began to vomit feces and was screaming in pain, even after 20mg  roxanol given.   It was advised to have caregiver call 911 for pt due to a possible bowel obstruction. Pt will have a bed available at hospice house in [place] tomorrow and hospital liaison will coordinate the transfer. Please manage symptoms until [facility] can get her to hospice home. Pt will need a covid test prior to transferring to hospice home tomorrow.   Please do not hesitate to call with any questions. Last morphine was 20mg  given at [place].   Thank you,  Freddie Breech, RN  [facility] Triage Nurse [phone]

## 2020-07-14 LAB — RESP PANEL BY RT-PCR (FLU A&B, COVID) ARPGX2
Influenza A by PCR: NEGATIVE
Influenza B by PCR: NEGATIVE
SARS Coronavirus 2 by RT PCR: NEGATIVE

## 2020-07-14 IMAGING — DX DG CHEST 1V PORT
1 series · 1 of 1 positions shown · non-contrast
Comparison: 11/04/2019

CLINICAL DATA: Shortness of breath.

EXAM:
PORTABLE CHEST 1 VIEW

[chest ap]
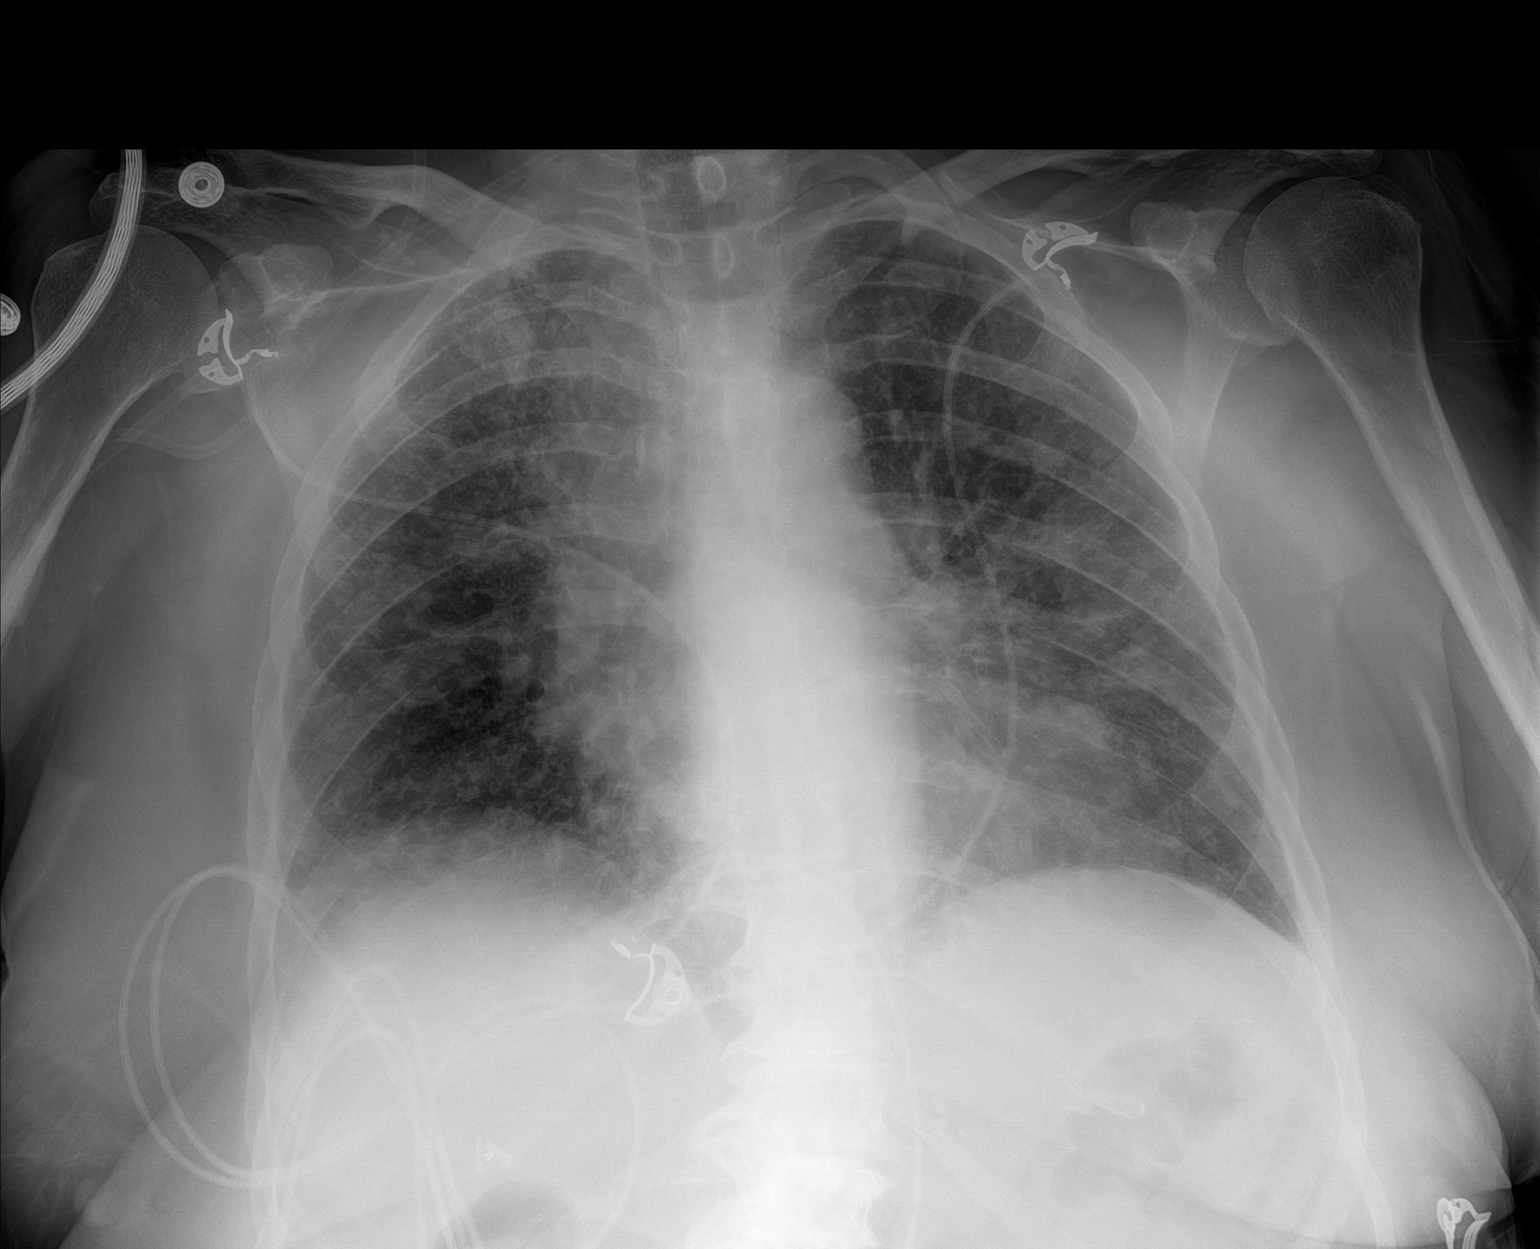

[1 of 1 positions shown; findings below may reference images not displayed]

FINDINGS: The cardiac silhouette, mediastinal and hilar contours are stable.
Coronary artery stents are noted. Persistent diffuse asymmetric
interstitial and airspace process in the lungs with diffuse
ground-glass type opacities suspicious for atypical/viral pneumonia.
No definite pleural effusions.
IMPRESSION: Persistent interstitial and airspace process suspicious for
atypical/viral pneumonia.

## 2020-07-14 NOTE — Progress Notes (Addendum)
CSW left voicemail with [facility] liaison phone to inquire about residential hospice. CSW will continue to try and reach [facility] liaison to facilitate transfer.   -CSW contacted [employer], per [employer] is liaison on call today and they will send her a message to reach out.   8:30a: CSW received call from liaison, anticipate no bed availability until early this coming week.

## 2020-07-14 NOTE — ED Provider Notes (Signed)
Blood pressure 134/66, pulse 64, temperature 97.6 F (36.4 C), temperature source Oral, resp. rate 18, height 4\' 11"  (1.499 m), weight 64 kg, SpO2 93 %.  In short, Kristine Oliver is a 86 y.o. female with a chief complaint of Emesis .  Refer to the original H&P for additional details.  11:00 AM  Patient has been seen by SW and Hospice consultation this AM. She has a bed available at hospice inpatient. Will discharge.     Margette Fast, MD 07/14/20 1101

## 2020-07-14 NOTE — ED Notes (Signed)
Updated daughter in-law

## 2020-07-14 NOTE — Progress Notes (Addendum)
[facility] Liaison note.    Pt is a current pt of [facility] hospice with terminal illnesses stage 4 CKD and heart failure. This is a related admission per [facility] MD.  Pt's family called [facility] around 6am today stating pt projectile vomiting. NP called in Zofran with no relief so a nurse visited and noted pt with excessive saliva, vomiting and moaning in pain ; levsin, compazine, atropine and roxanol ordered. Family believes that pt had a BM early this morning but pt began to vomit feces and was screaming in pain, even after 20mg  roxanol given.   VS: 117/64, HR 69, RR 25, 90RA I&O: 0 N/V Labs: Results for ANAVEY, COOMBES (MRN [id number]) as of 07/14/2020 08:51  Glucose: 115 (H) BUN: 33 (H) Calcium Ionized: 1.48 (H) Hemoglobin: 18.7 (H) HCT: 55.0 (H) Alkaline Phosphate 190(H) GFR 49 BNP 433.3 Hgb 18.7 (H) HCT 55 (H) NEUT 7.9 (H)  Diagnostics: IMPRESSION: 1. There is questionable mild circumferential wall thickening of the sigmoid colon versus underdistention. Correlate for signs and symptoms of colitis. 2. There is an above average amount of stool throughout the colon. 3. The stomach is moderately distended. There is a small to moderate-sized hiatal hernia. 4. There is diffuse circumferential wall thickening of the esophagus, which may be secondary to esophagitis. 5. There is significant intrahepatic and extrahepatic biliary ductal dilatation. This is somewhat similar to prior study. This may be secondary to prior cholecystectomy. Correlation with laboratory studies is recommended. 6. Cardiomegaly. 7. Chronic lung changes at the lung bases bilaterally consistent with pulmonary fibrosis.  Aortic Atherosclerosis (ICD10-I70.0).   Electronically Signed   By: Constance Holster M.D.   On: 07/13/2020 22:04  Problem List: Esophagitis per CT Nausea/ Vomiting Pain control  Hospice   Received request from [place] for family interest in  [place].  Hospice Home notified of request and patient has been approved and there is a bed available today. Plans are to discharge this pt today.  This liaison spoke to [place] who confirms interest and would like to initiate transport to hospice home as soon as a bed becomes available.    A Please do not hesitate to call with questions.        Thank you,    Clementeen Hoof, BSN, RN     [place] (listed on AM[facility] under Hospice and [place] [facility] [place])   [phone]

## 2020-08-03 IMAGING — DX DG CHEST 1V PORT
1 series · 1 of 1 positions shown · non-contrast
Comparison: Chest radiograph dated 11/09/2019.

CLINICAL DATA: 88-year-old female with cough and shortness of
breath

EXAM:
PORTABLE CHEST 1 VIEW

[chest ap]
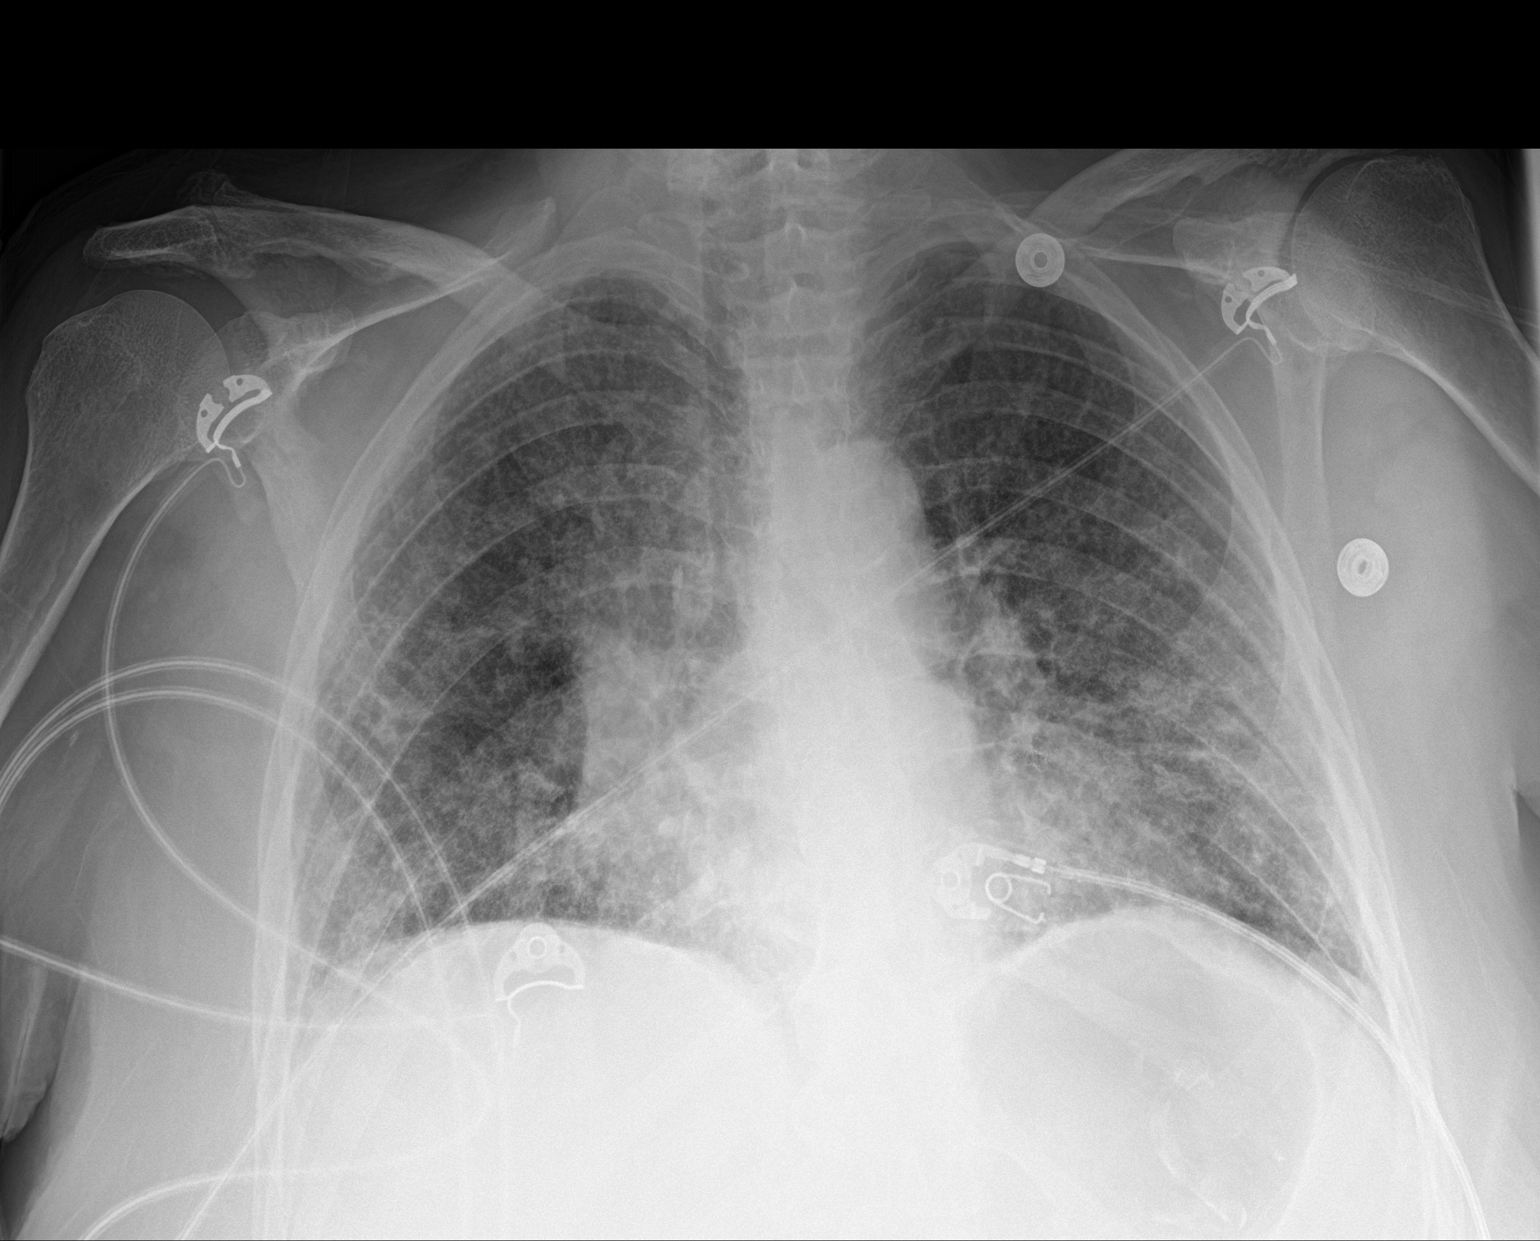

[1 of 1 positions shown; findings below may reference images not displayed]

FINDINGS: Diffuse bilateral reticulonodular densities similar or minimally
progressed since the prior radiograph. No lobar consolidation,
pleural effusion, pneumothorax. Stable cardiomediastinal silhouette.
No acute osseous pathology.
IMPRESSION: Similar or minimally progressed multifocal opacities. Continued
follow-up recommended.

## 2020-08-03 DEATH — deceased

## 2020-08-13 IMAGING — DX DG TIBIA/FIBULA 2V*R*
2 series · 2 of 2 positions shown · non-contrast
Comparison: None.

CLINICAL DATA: Fall. On anticoagulation. Right leg pain and
hematoma. Initial encounter.

EXAM:
RIGHT TIBIA AND FIBULA - 2 VIEW

[x tib-fib ap right]
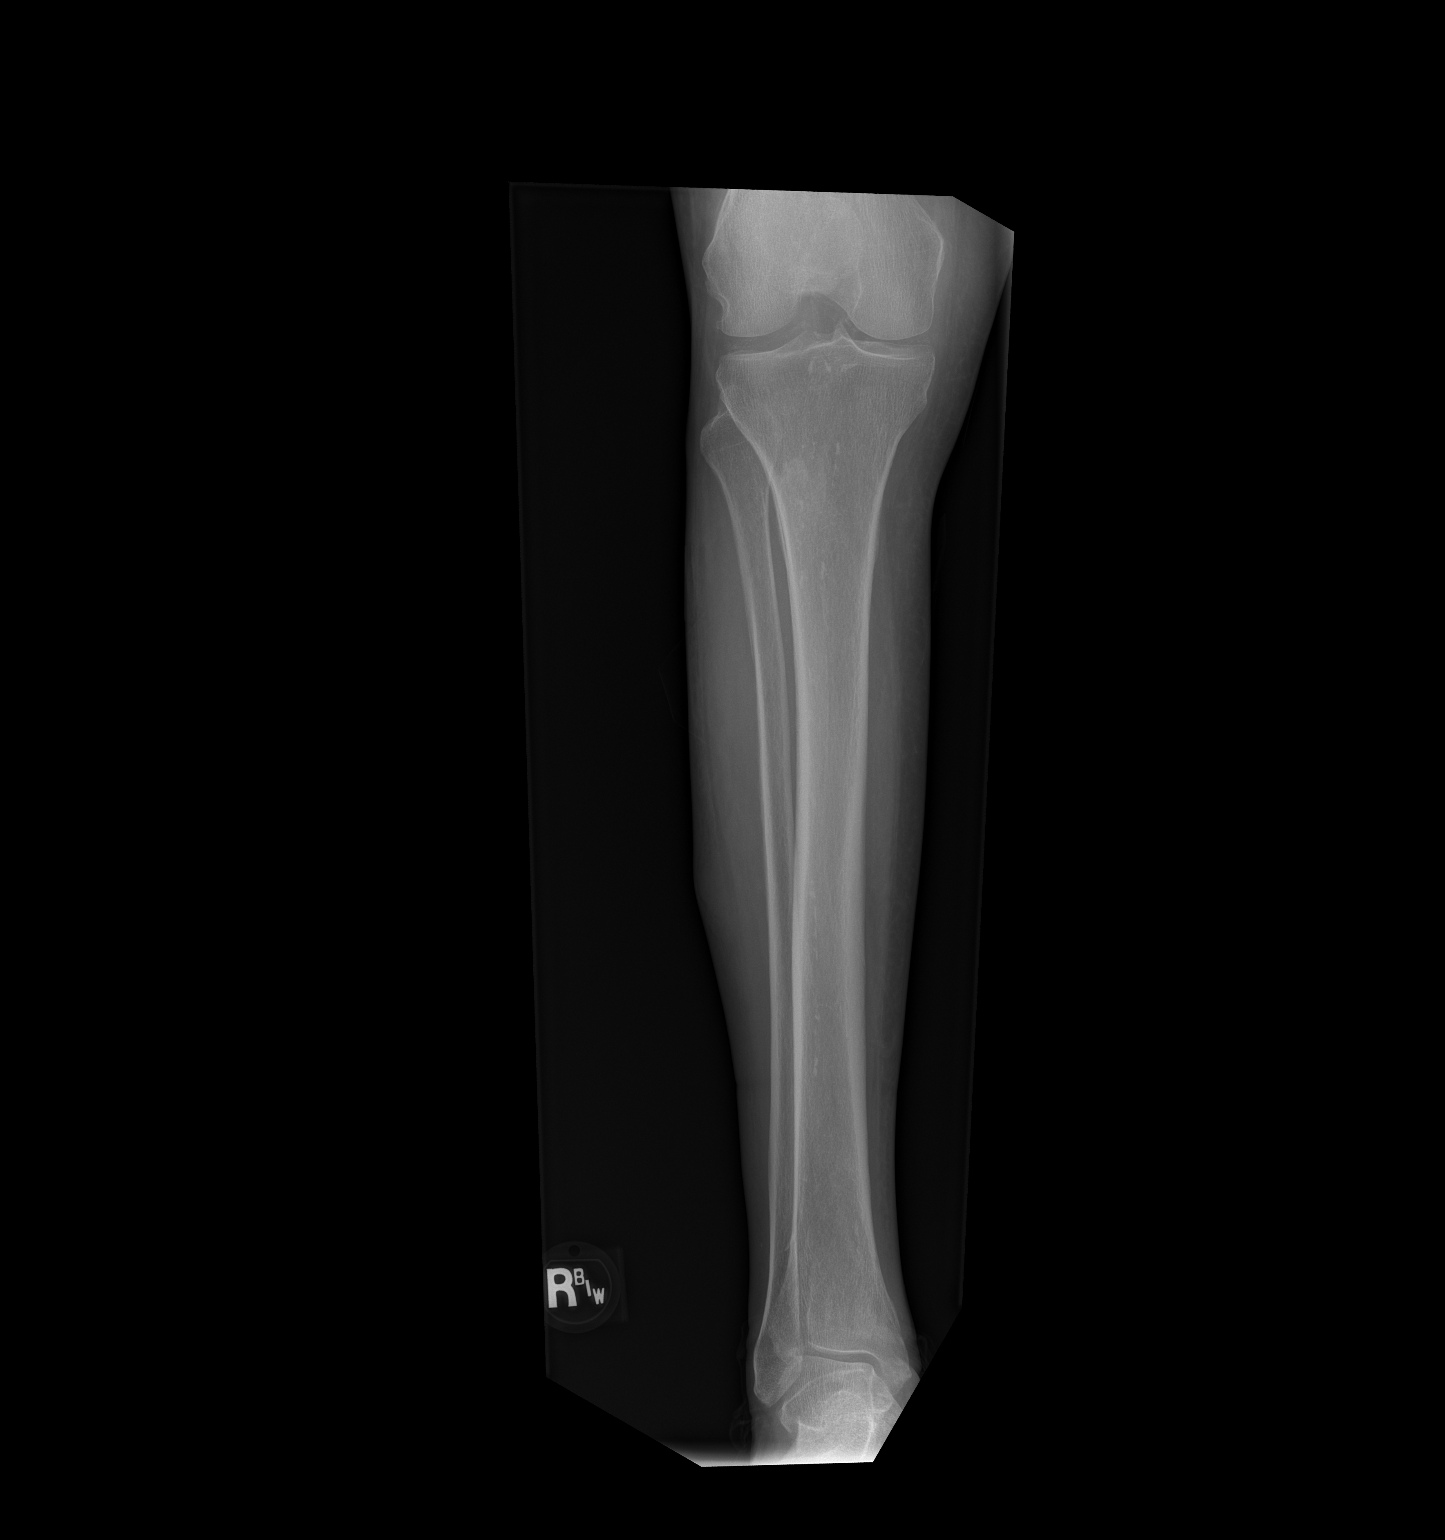

[x tib-fib lat right]
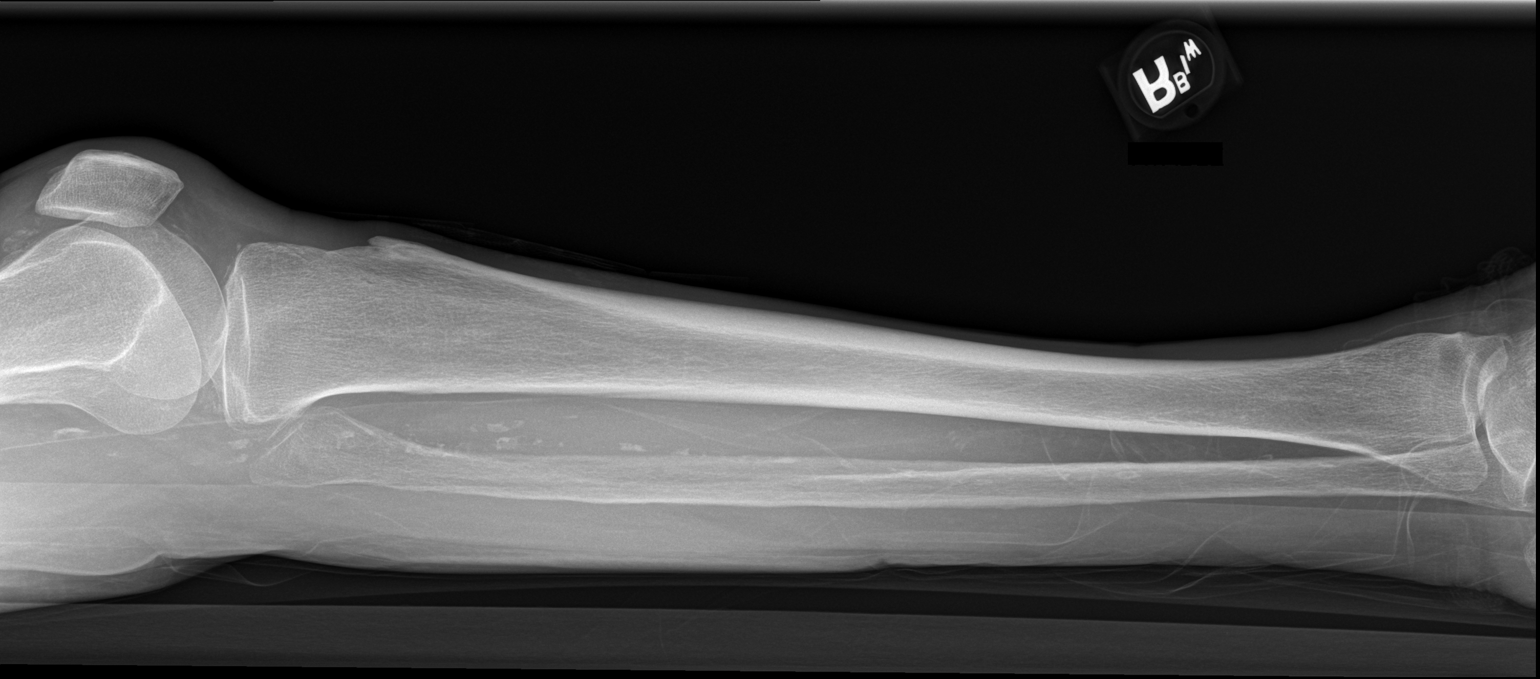

[2 of 2 positions shown; findings below may reference images not displayed]

FINDINGS: There is no evidence of fracture or other focal bone lesions.
Generalized osteopenia is noted. Peripheral vascular calcification
is also seen.
IMPRESSION: No acute findings.

## 2020-08-13 IMAGING — DX DG FOREARM 2V*R*
2 series · 2 of 2 positions shown · non-contrast
Comparison: None.

CLINICAL DATA: Fall. Abrasions on the forearms.

EXAM:
RIGHT FOREARM - 2 VIEW

[x forearm ap right]
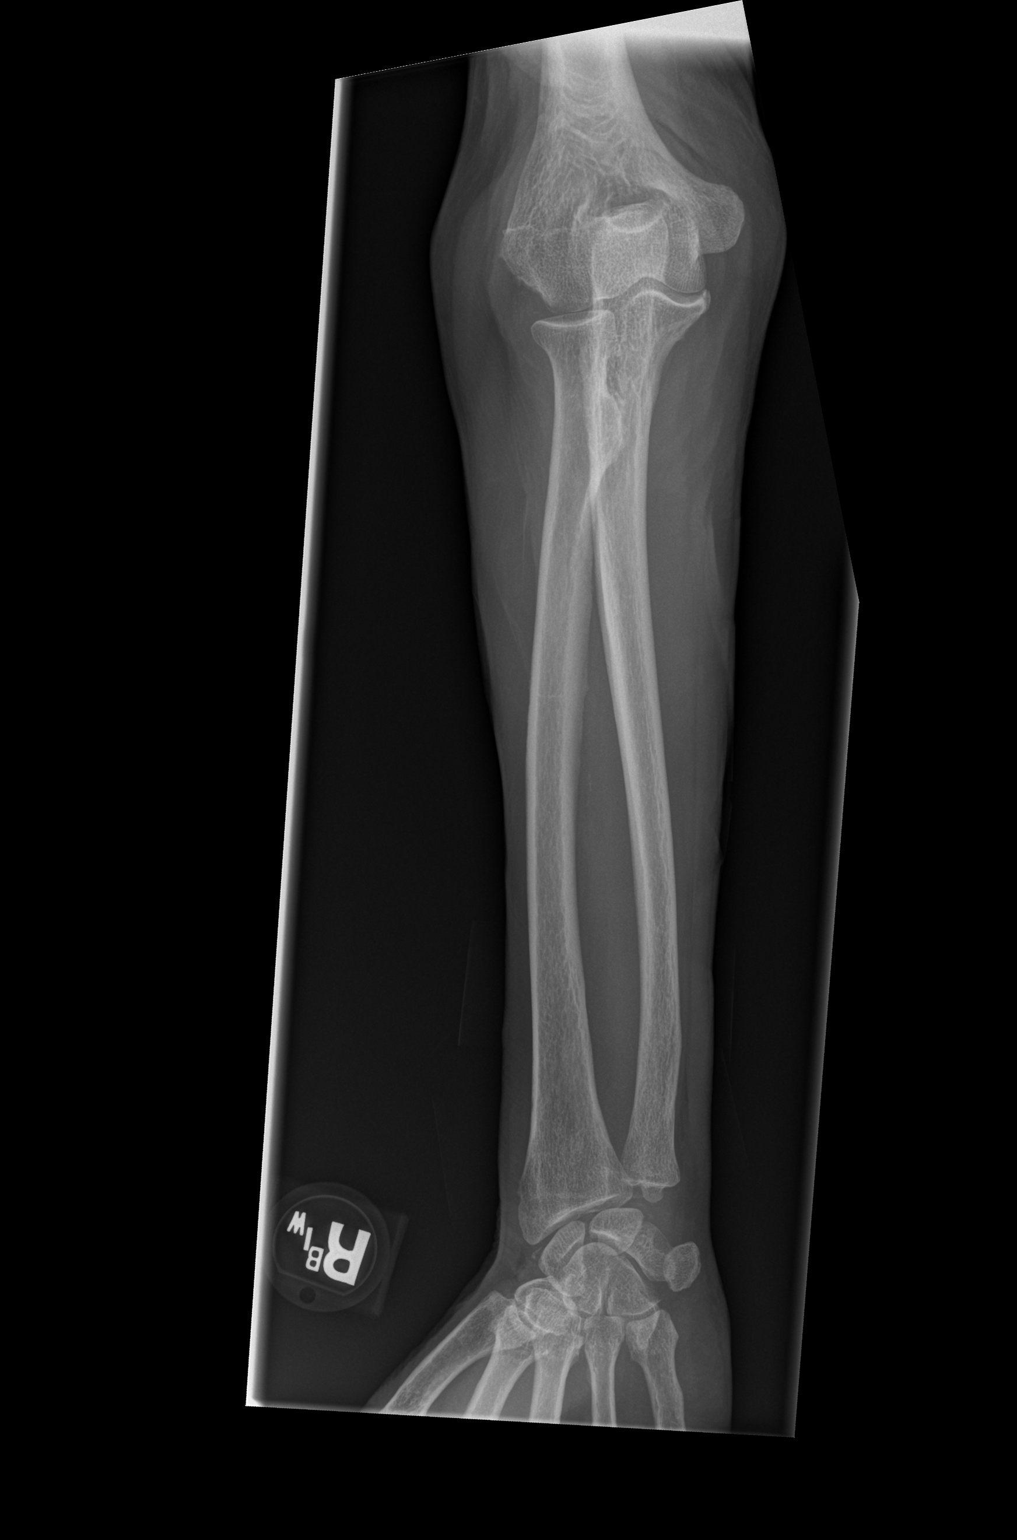

[x forearm lat right]
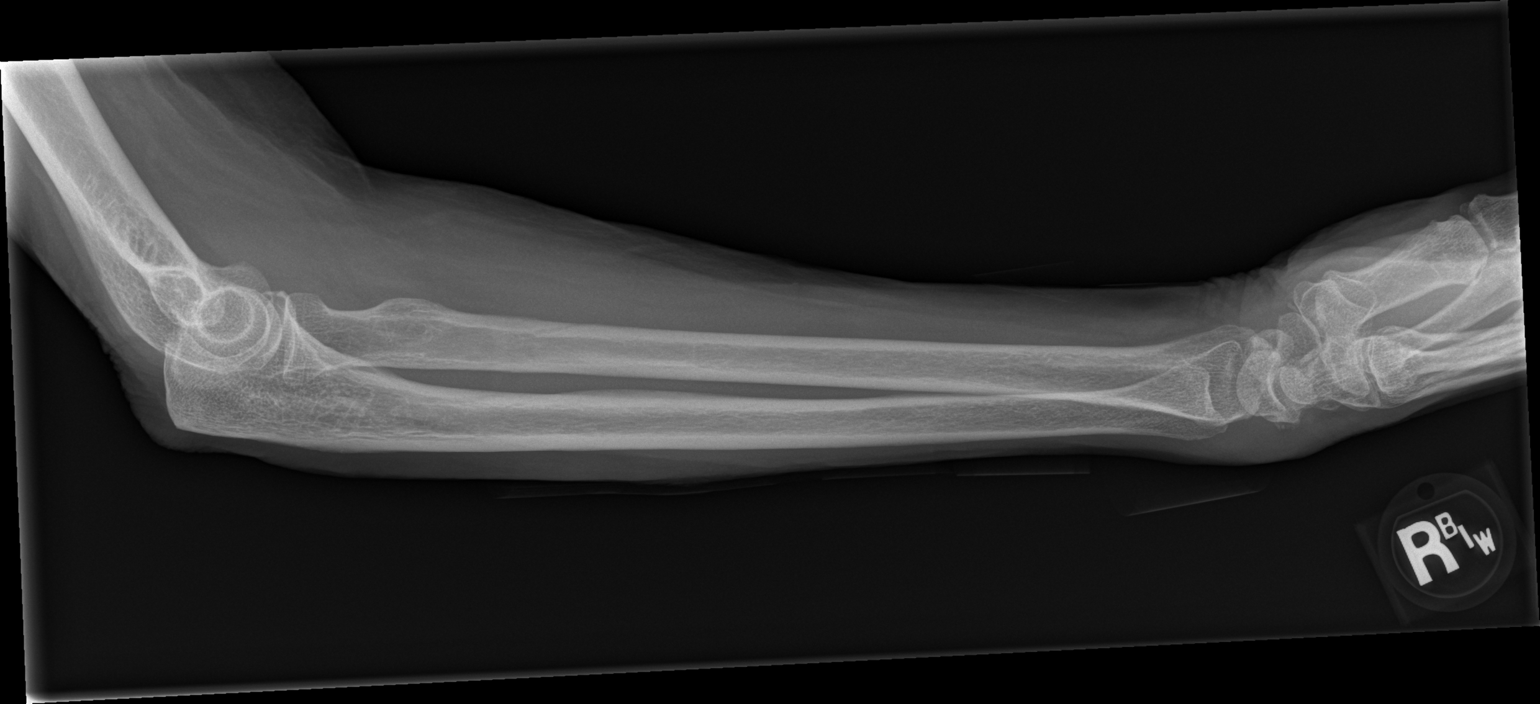

[2 of 2 positions shown; findings below may reference images not displayed]

FINDINGS: No definite acute fracture is identified. Slight irregularity of the
coronoid process of the ulna may be degenerative. The wrist and
elbow are located. Mild chondrocalcinosis is noted at the wrist. No
radiopaque foreign body is identified.
IMPRESSION: No acute osseous abnormality identified.
